# Patient Record
Sex: Female | Born: 1945
Health system: Southern US, Community
[De-identification: ages and names within clinical notes are randomized; demographics above are authoritative.]

## PROBLEM LIST (undated history)

## (undated) DIAGNOSIS — I10 Essential (primary) hypertension: Secondary | ICD-10-CM

## (undated) DIAGNOSIS — R0602 Shortness of breath: Secondary | ICD-10-CM

## (undated) DIAGNOSIS — M1990 Unspecified osteoarthritis, unspecified site: Secondary | ICD-10-CM

## (undated) DIAGNOSIS — F419 Anxiety disorder, unspecified: Secondary | ICD-10-CM

## (undated) DIAGNOSIS — E119 Type 2 diabetes mellitus without complications: Secondary | ICD-10-CM

## (undated) DIAGNOSIS — I2510 Atherosclerotic heart disease of native coronary artery without angina pectoris: Secondary | ICD-10-CM

## (undated) DIAGNOSIS — I639 Cerebral infarction, unspecified: Secondary | ICD-10-CM

## (undated) DIAGNOSIS — J45909 Unspecified asthma, uncomplicated: Secondary | ICD-10-CM

## (undated) DIAGNOSIS — H269 Unspecified cataract: Secondary | ICD-10-CM

## (undated) DIAGNOSIS — E785 Hyperlipidemia, unspecified: Secondary | ICD-10-CM

## (undated) HISTORY — PX: DILATION AND CURETTAGE OF UTERUS: SHX78

## (undated) HISTORY — PX: APPENDECTOMY: SHX54

## (undated) HISTORY — PX: VAGINAL HYSTERECTOMY: SUR661

## (undated) HISTORY — DX: Essential (primary) hypertension: I10

## (undated) HISTORY — DX: Hyperlipidemia, unspecified: E78.5

## (undated) HISTORY — PX: TUBAL LIGATION: SHX77

## (undated) HISTORY — PX: CHOLECYSTECTOMY: SHX55

## (undated) HISTORY — PX: TONSILLECTOMY: SUR1361

## (undated) HISTORY — DX: Unspecified cataract: H26.9

---

## 2000-06-25 ENCOUNTER — Encounter (HOSPITAL_COMMUNITY): Admission: RE | Admit: 2000-06-25 | Discharge: 2000-07-25 | Payer: Self-pay | Admitting: Neurosurgery

## 2000-07-29 ENCOUNTER — Encounter (HOSPITAL_COMMUNITY): Admission: RE | Admit: 2000-07-29 | Discharge: 2000-08-28 | Payer: Self-pay | Admitting: Neurosurgery

## 2000-08-01 ENCOUNTER — Encounter: Admission: RE | Admit: 2000-08-01 | Discharge: 2000-08-01 | Payer: Self-pay | Admitting: Neurosurgery

## 2001-10-07 ENCOUNTER — Ambulatory Visit (HOSPITAL_COMMUNITY): Admission: RE | Admit: 2001-10-07 | Discharge: 2001-10-07 | Payer: Self-pay | Admitting: Family Medicine

## 2001-10-07 ENCOUNTER — Encounter: Payer: Self-pay | Admitting: Family Medicine

## 2004-06-26 ENCOUNTER — Ambulatory Visit (HOSPITAL_COMMUNITY): Admission: RE | Admit: 2004-06-26 | Discharge: 2004-06-26 | Payer: Self-pay | Admitting: Family Medicine

## 2004-08-24 ENCOUNTER — Ambulatory Visit (HOSPITAL_COMMUNITY): Admission: RE | Admit: 2004-08-24 | Discharge: 2004-08-24 | Payer: Self-pay | Admitting: Family Medicine

## 2005-03-21 ENCOUNTER — Ambulatory Visit (HOSPITAL_COMMUNITY): Admission: RE | Admit: 2005-03-21 | Discharge: 2005-03-21 | Payer: Self-pay | Admitting: Family Medicine

## 2005-08-24 ENCOUNTER — Emergency Department (HOSPITAL_COMMUNITY): Admission: EM | Admit: 2005-08-24 | Discharge: 2005-08-24 | Payer: Self-pay | Admitting: Emergency Medicine

## 2006-03-18 HISTORY — PX: CARDIAC CATHETERIZATION: SHX172

## 2006-06-24 ENCOUNTER — Ambulatory Visit (HOSPITAL_COMMUNITY): Admission: RE | Admit: 2006-06-24 | Discharge: 2006-06-24 | Payer: Self-pay | Admitting: Cardiology

## 2006-06-24 ENCOUNTER — Ambulatory Visit: Payer: Self-pay | Admitting: Cardiovascular Disease

## 2006-07-09 ENCOUNTER — Ambulatory Visit: Payer: Self-pay | Admitting: Cardiovascular Disease

## 2006-07-09 ENCOUNTER — Ambulatory Visit (HOSPITAL_COMMUNITY): Admission: RE | Admit: 2006-07-09 | Discharge: 2006-07-09 | Payer: Self-pay | Admitting: Cardiovascular Disease

## 2007-02-06 ENCOUNTER — Ambulatory Visit (HOSPITAL_COMMUNITY): Admission: RE | Admit: 2007-02-06 | Discharge: 2007-02-06 | Payer: Self-pay | Admitting: Family Medicine

## 2007-03-01 ENCOUNTER — Emergency Department (HOSPITAL_COMMUNITY): Admission: EM | Admit: 2007-03-01 | Discharge: 2007-03-01 | Payer: Self-pay | Admitting: Emergency Medicine

## 2008-07-08 ENCOUNTER — Ambulatory Visit (HOSPITAL_COMMUNITY): Admission: RE | Admit: 2008-07-08 | Discharge: 2008-07-08 | Payer: Self-pay | Admitting: Family Medicine

## 2008-08-12 ENCOUNTER — Ambulatory Visit (HOSPITAL_COMMUNITY): Admission: RE | Admit: 2008-08-12 | Discharge: 2008-08-12 | Payer: Self-pay | Admitting: Family Medicine

## 2008-08-16 ENCOUNTER — Inpatient Hospital Stay (HOSPITAL_COMMUNITY): Admission: EM | Admit: 2008-08-16 | Discharge: 2008-08-18 | Payer: Self-pay | Admitting: Emergency Medicine

## 2008-08-17 ENCOUNTER — Ambulatory Visit: Payer: Self-pay | Admitting: Gastroenterology

## 2008-08-17 ENCOUNTER — Telehealth: Payer: Self-pay | Admitting: Gastroenterology

## 2008-08-18 ENCOUNTER — Encounter: Payer: Self-pay | Admitting: Gastroenterology

## 2008-08-29 ENCOUNTER — Telehealth: Payer: Self-pay | Admitting: Gastroenterology

## 2008-08-29 ENCOUNTER — Ambulatory Visit (HOSPITAL_COMMUNITY): Admission: RE | Admit: 2008-08-29 | Discharge: 2008-08-29 | Payer: Self-pay | Admitting: Gastroenterology

## 2008-08-29 ENCOUNTER — Ambulatory Visit: Payer: Self-pay | Admitting: Gastroenterology

## 2008-09-05 ENCOUNTER — Encounter: Payer: Self-pay | Admitting: Internal Medicine

## 2008-09-12 ENCOUNTER — Encounter: Payer: Self-pay | Admitting: Gastroenterology

## 2008-09-12 DIAGNOSIS — E785 Hyperlipidemia, unspecified: Secondary | ICD-10-CM | POA: Insufficient documentation

## 2008-09-12 DIAGNOSIS — E1159 Type 2 diabetes mellitus with other circulatory complications: Secondary | ICD-10-CM

## 2008-09-12 DIAGNOSIS — E119 Type 2 diabetes mellitus without complications: Secondary | ICD-10-CM

## 2008-09-12 DIAGNOSIS — I10 Essential (primary) hypertension: Secondary | ICD-10-CM | POA: Insufficient documentation

## 2008-09-12 DIAGNOSIS — E1169 Type 2 diabetes mellitus with other specified complication: Secondary | ICD-10-CM | POA: Insufficient documentation

## 2008-09-12 DIAGNOSIS — I152 Hypertension secondary to endocrine disorders: Secondary | ICD-10-CM | POA: Insufficient documentation

## 2008-09-13 ENCOUNTER — Ambulatory Visit: Payer: Self-pay | Admitting: Gastroenterology

## 2008-09-13 ENCOUNTER — Ambulatory Visit (HOSPITAL_COMMUNITY): Admission: RE | Admit: 2008-09-13 | Discharge: 2008-09-13 | Payer: Self-pay | Admitting: Gastroenterology

## 2008-09-13 ENCOUNTER — Emergency Department (HOSPITAL_COMMUNITY): Admission: EM | Admit: 2008-09-13 | Discharge: 2008-09-13 | Payer: Self-pay | Admitting: Emergency Medicine

## 2008-10-21 ENCOUNTER — Telehealth (INDEPENDENT_AMBULATORY_CARE_PROVIDER_SITE_OTHER): Payer: Self-pay

## 2009-03-05 ENCOUNTER — Emergency Department (HOSPITAL_COMMUNITY): Admission: EM | Admit: 2009-03-05 | Discharge: 2009-03-06 | Payer: Self-pay | Admitting: Emergency Medicine

## 2009-07-03 ENCOUNTER — Ambulatory Visit: Payer: Self-pay | Admitting: Cardiovascular Disease

## 2009-07-03 DIAGNOSIS — R011 Cardiac murmur, unspecified: Secondary | ICD-10-CM | POA: Insufficient documentation

## 2009-07-04 ENCOUNTER — Encounter: Payer: Self-pay | Admitting: Cardiovascular Disease

## 2009-07-10 ENCOUNTER — Ambulatory Visit: Payer: Self-pay | Admitting: Cardiology

## 2009-07-10 ENCOUNTER — Encounter: Payer: Self-pay | Admitting: Cardiovascular Disease

## 2009-07-10 ENCOUNTER — Ambulatory Visit (HOSPITAL_COMMUNITY): Admission: RE | Admit: 2009-07-10 | Discharge: 2009-07-10 | Payer: Self-pay | Admitting: Cardiovascular Disease

## 2009-09-04 ENCOUNTER — Ambulatory Visit: Payer: Self-pay | Admitting: Cardiovascular Disease

## 2009-09-04 DIAGNOSIS — R072 Precordial pain: Secondary | ICD-10-CM | POA: Insufficient documentation

## 2009-09-04 DIAGNOSIS — R079 Chest pain, unspecified: Secondary | ICD-10-CM

## 2009-09-19 ENCOUNTER — Ambulatory Visit: Payer: Self-pay | Admitting: Cardiovascular Disease

## 2009-10-03 ENCOUNTER — Ambulatory Visit: Payer: Self-pay | Admitting: Cardiology

## 2009-10-09 ENCOUNTER — Encounter: Payer: Self-pay | Admitting: Cardiovascular Disease

## 2009-12-16 HISTORY — PX: CARDIOVASCULAR STRESS TEST: SHX262

## 2010-01-12 ENCOUNTER — Inpatient Hospital Stay (HOSPITAL_COMMUNITY)
Admission: EM | Admit: 2010-01-12 | Discharge: 2010-01-13 | Payer: Self-pay | Source: Home / Self Care | Admitting: Emergency Medicine

## 2010-01-12 ENCOUNTER — Ambulatory Visit: Payer: Self-pay | Admitting: Cardiology

## 2010-02-22 ENCOUNTER — Encounter (INDEPENDENT_AMBULATORY_CARE_PROVIDER_SITE_OTHER): Payer: Self-pay | Admitting: *Deleted

## 2010-02-23 ENCOUNTER — Encounter (INDEPENDENT_AMBULATORY_CARE_PROVIDER_SITE_OTHER): Payer: Self-pay | Admitting: *Deleted

## 2010-03-13 ENCOUNTER — Ambulatory Visit (HOSPITAL_COMMUNITY)
Admission: RE | Admit: 2010-03-13 | Discharge: 2010-03-13 | Payer: Self-pay | Source: Home / Self Care | Attending: Gastroenterology | Admitting: Gastroenterology

## 2010-04-08 ENCOUNTER — Encounter: Payer: Self-pay | Admitting: Family Medicine

## 2010-04-17 NOTE — Assessment & Plan Note (Signed)
Summary: ***per Dr.McInnis for hyperlipidemia/past due for f/u/tg   Visit Type:  Follow-up Primary Provider:  Dr.Angus Mcinnis  CC:  past due for followup.  History of Present Illness: Tonya Carpenter is an anxious diabetic with HTN, elevated lipids and SSCP.  She has a normal cath in 2008.  She is very anxious.  She has a murmur with LVH and ? previous mid-cavitary lesion.  She has functional dyspnea.  Activity is also limited by back pain.  She denies syncope, fever, cough and mild chronic LE edema.  Apparantly lisinopril has been added recently for BP but I think her control is still suboptimal  Current Problems (verified): 1)  Diabetes Mellitus  (ICD-250.00) 2)  Hypertension, Unspecified  (ICD-401.9) 3)  Hyperlipidemia-mixed  (ICD-272.4)  Current Medications (verified): 1)  Fiber  Powd (Fiber) .Marland Kitchen.. 1 Dose Twice A Day. 2)  Lantus 100 Unit/ml Soln (Insulin Glargine) .... 75 U Qam 3)  Plavix 75 Mg Tabs (Clopidogrel Bisulfate) .Marland Kitchen.. 1 By Mouth Qd 4)  Humalog 100 Unit/ml Soln (Insulin Lispro (Human)) .... Slidind Scale 5)  Metoprolol Succinate 100 Mg Xr24h-Tab (Metoprolol Succinate) .... Take 1 Tab Daily 6)  Lisinopril 40 Mg Tabs (Lisinopril) .... Take 1 Tablet By Mouth Once Daily 7)  Simvastatin 40 Mg Tabs (Simvastatin) .... Take 1 Tab Daily  Allergies (verified): No Known Drug Allergies  Past History:  Past Medical History: Last updated: 09/12/2008 DIABETES MELLITUS (ICD-250.00) HYPERTENSION, UNSPECIFIED (ICD-401.9) ECG with marked LVH HYPERLIPIDEMIA-MIXED (ICD-272.4) Chest Pain:  normal cath 06/2006    Past Surgical History: Last updated: 09/12/2008 Abdominal Hysterectomy-Total tubal ligation Cholycystectomy  Social History: Last updated: 07/03/2009 Tobacco Use - No.  Alcohol Use - no Married On disability  Social History: Tobacco Use - No.  Alcohol Use - no Married On disability  Review of Systems       Denies fever, malais, weight loss, blurry vision, decreased  visual acuity, cough, sputum,  hemoptysis, pleuritic pain, palpitaitons, heartburn, abdominal pain, melena, lower extremity edema, claudication, or rash.   Vital Signs:  Patient profile:   65 year old female Height:      65 inches Weight:      205 pounds BMI:     34.24 Pulse rate:   76 / minute BP sitting:   185 / 83  (right arm)  Vitals Entered By: [name], CNA (July 03, 2009 1:09 PM)  Physical Exam  General:  Affect appropriate Healthy:  appears stated age HEENT: normal Neck supple with no adenopathy JVP normal no bruits no thyromegaly Lungs clear with no wheezing and good diaphragmatic motion Heart:  S1/S2 systolic murmur,rub, gallop or click PMI normal Abdomen: benighn, BS positve, no tenderness, no AAA no bruit.  No HSM or HJR Distal pulses intact with no bruits No edema Neuro non-focal Skin warm and dry    Impression & Recommendations:  Problem # 1:  CARDIAC MURMUR (ICD-785.2) Check echo.  No accentuation with valvsalva but history of small LV cavity and LVH Her updated medication list for this problem includes:    Metoprolol Succinate 100 Mg Xr24h-tab (Metoprolol succinate) .Marland Kitchen... Take 1 tab daily    Lisinopril 40 Mg Tabs (Lisinopril) .Marland Kitchen... Take 1 tablet by mouth once daily  Orders: 2-D Echocardiogram (2D Echo)  Problem # 2:  HYPERTENSION, UNSPECIFIED (ICD-401.9) Increase lisinopril.  F/U post echo Her updated medication list for this problem includes:    Metoprolol Succinate 100 Mg Xr24h-tab (Metoprolol succinate) .Marland Kitchen... Take 1 tab daily    Lisinopril 40 Mg Tabs (Lisinopril) .Marland KitchenMarland KitchenMarland KitchenMarland Kitchen  Take 1 tablet by mouth once daily  Problem # 3:  HYPERLIPIDEMIA-MIXED (ICD-272.4) Reviewed labs from Dr Adora Fridge LDL is 100 at goal with no side effects Her updated medication list for this problem includes:    Simvastatin 40 Mg Tabs (Simvastatin) .Marland Kitchen... Take 1 tab daily  Her updated medication list for this problem includes:    Simvastatin 40 Mg Tabs (Simvastatin) .Marland Kitchen...  Take 1 tab daily  Patient Instructions: 1)  Your physician recommends that you schedule a follow-up appointment in: 2 months 2)  Your physician has recommended you make the following change in your medication: Increase Lisinopril to 40mg  by mouth once daily  3)  Your physician has requested that you have an echocardiogram.  Echocardiography is a painless test that uses sound waves to create images of your heart. It provides your doctor with information about the size and shape of your heart and how well your heart's chambers and valves are working.  This procedure takes approximately one hour. There are no restrictions for this procedure. Prescriptions: LISINOPRIL 40 MG TABS (LISINOPRIL) Take 1 tablet by mouth once daily  #30 x 6   Entered by:   Larita Fife Via LPN   Authorized by:   Colon Branch, MD, [facility]   Signed by:   Larita Fife Via LPN on 16/12/9602   Method used:   Electronically to        [facility] (retail)       [address]       [place], [place]  [address]       Ph: [phone]       Fax: [phone]   RxID:   [id number]

## 2010-04-17 NOTE — Letter (Signed)
Summary: Appointment - Reminder 2  [facility]. [address]   [place], [place] [address]   Phone: [phone]  Fax: [phone]     February 23, 2010 MRN: [id number]   [facility] [place] [address] [place], [place]  [address]   Dear Ms. Mottley,  Our records indicate that it is time to schedule a follow-up appointment.  Dr. [place] Emms         recommended that you follow up with [place] in    12.2011        . It is very important that we reach you to schedule this appointment. We look forward to participating in your health care needs. Please contact us at the number listed above at your earliest convenience to schedule your appointment.  If you are unable to make an appointment at this time, give [place] a call so we can update our records.     Sincerely,   Glass blower/designer

## 2010-04-17 NOTE — Letter (Signed)
Summary: progress note  progress note   Imported By: Faythe Ghee 07/04/2009 12:00:29  _____________________________________________________________________  External Attachment:    Type:   Image     Comment:   External Document

## 2010-04-17 NOTE — Assessment & Plan Note (Signed)
Summary: nurse visit  Nurse Visit   Vital Signs:  Patient profile:   65 year old female O2 Sat:      97 % on Room air Pulse rate:   65 / minute BP sitting:   165 / 77  (right arm)  Vitals Entered By: Larita Fife Via LPN (October 03, 2009 9:55 AM)  O2 Flow:  Room air  Visit Type:  Nurse visit/BP check Primary Provider:  [facility]   History of Present Illness: S: Pt. arrives in office for second of three BP checks odered on last OV of 09-04-2009 with Dr. [place] Emms B: On last OV BP= 202/98, on first nurse visit/BP check on 09-19-09 BP=218/94, today BP= 165/77. Pt. is now being seen at the free clinic, she received Plavix this morning from [place] through the drug assistance program and all others throught the free clinic.  A: Pt. c/o leg cramps and feeling tired. Pt BS is elevated at 200 (she checked before coming to office).    Other Orders: EKG w/ Interpretation (93000)   Current Medications (verified): 1)  Fiber  Powd (Fiber) .Marland Kitchen.. 1 Dose Twice A Day. 2)  Lantus 100 Unit/ml Soln (Insulin Glargine) .... 75 U Qam 3)  Plavix 75 Mg Tabs (Clopidogrel Bisulfate) .Marland Kitchen.. 1 By Mouth Qd 4)  Humalog 100 Unit/ml Soln (Insulin Lispro (Human)) .... Sliding Scale 5)  Metoprolol Succinate 100 Mg Xr24h-Tab (Metoprolol Succinate) .... Take 1 Tab Daily 6)  Lisinopril 40 Mg Tabs (Lisinopril) .... Take 1 Tablet By Mouth Once Daily 7)  Simvastatin 40 Mg Tabs (Simvastatin) .... Take 1 Tab Daily 8)  Nitrostat 0.4 Mg Subl (Nitroglycerin) .... Take As Directed For Chestpain 9)  Hydralazine Hcl 10 Mg Tabs (Hydralazine Hcl) .... Take 1 Tablet By Mouth Once Daily  Allergies (verified): No Known Drug Allergies  Orders Added: 1)  EKG w/ Interpretation [93000]

## 2010-04-17 NOTE — Assessment & Plan Note (Signed)
Summary: F2M   Visit Type:  Follow-up Primary Provider:  Dr.Angus Mcinnis  CC:  chest discomfort yesterday.  History of Present Illness: Tonya Carpenter is an anxious diabetic with HTN, elevated lipids and SSCP.  She has a normal cath in 2008.  She is very anxious.  She has a murmur with LVH and ? previous mid-cavitary lesion.  She has functional dyspnea.  Activity is also limited by back pain.  She denies syncope, fever, cough and mild chronic LE edema.  Apparantly lisinopril has been added recently for BP but I think her control is still suboptimal  She is under stress about losing medicaid funding and has cheated on her diet.  She had lots of excuses about her BP being high.  I told her we would have her come back weekly x3 and if high we will call her in norvasc 10mg .    Current Problems (verified): 1)  R/O Mi  (ICD-410.90) 2)  R/O Cardiomyopathy, Dilated  (ICD-425.4) 3)  Cardiac Murmur  (ICD-785.2) 4)  Diabetes Mellitus  (ICD-250.00) 5)  Hypertension, Unspecified  (ICD-401.9) 6)  Hyperlipidemia-mixed  (ICD-272.4)  Current Medications (verified): 1)  Fiber  Powd (Fiber) .Marland Kitchen.. 1 Dose Twice A Day. 2)  Lantus 100 Unit/ml Soln (Insulin Glargine) .... 75 U Qam 3)  Plavix 75 Mg Tabs (Clopidogrel Bisulfate) .Marland Kitchen.. 1 By Mouth Qd 4)  Humalog 100 Unit/ml Soln (Insulin Lispro (Human)) .... Sliding Scale 5)  Metoprolol Succinate 100 Mg Xr24h-Tab (Metoprolol Succinate) .... Take 1 Tab Daily 6)  Lisinopril 40 Mg Tabs (Lisinopril) .... Take 1 Tablet By Mouth Once Daily 7)  Simvastatin 40 Mg Tabs (Simvastatin) .... Take 1 Tab Daily 8)  Nitrostat 0.4 Mg Subl (Nitroglycerin) .... Take As Directed For Chestpain  Allergies (verified): No Known Drug Allergies  Past History:  Past Medical History: Last updated: 09/12/2008 DIABETES MELLITUS (ICD-250.00) HYPERTENSION, UNSPECIFIED (ICD-401.9) ECG with marked LVH HYPERLIPIDEMIA-MIXED (ICD-272.4) Chest Pain:  normal cath 06/2006    Past Surgical History: Last  updated: 09/12/2008 Abdominal Hysterectomy-Total tubal ligation Cholycystectomy  Family History: Last updated: 09/04/2009 non-contributory  Social History: Last updated: 07/03/2009 Tobacco Use - No.  Alcohol Use - no Married On disability  Family History: non-contributory  Review of Systems       Denies fever, malais, weight loss, blurry vision, decreased visual acuity, cough, sputum, SOB, hemoptysis, pleuritic pain, palpitaitons, heartburn, abdominal pain, melena, lower extremity edema, claudication, or rash.   Vital Signs:  Patient profile:   65 year old female Weight:      208 pounds Pulse rate:   70 / minute BP sitting:   202 / 98  (right arm)  Vitals Entered By: [name], CNA (September 04, 2009 9:44 AM)  Physical Exam  General:  Affect appropriate Healthy:  appears stated age HEENT: normal Neck supple with no adenopathy JVP normal no bruits no thyromegaly Lungs clear with no wheezing and good diaphragmatic motion Heart:  S1/S2 no murmur,rub, gallop or click PMI normal Abdomen: benighn, BS positve, no tenderness, no AAA no bruit.  No HSM or HJR Distal pulses intact with no bruits No edema Neuro non-focal Skin warm and dry    Impression & Recommendations:  Problem # 1:  HYPERTENSION, UNSPECIFIED (ICD-401.9) Not well controlled but paitent resident to add medicine.  Told her of risk of stroke, MI, disection and kidney failure if HTN not well controlled Also stressed low sodium diet.  F/U clinici visit x3 and add norvasc 10mg  if still high. Her updated medication list for  this problem includes:    Metoprolol Succinate 100 Mg Xr24h-tab (Metoprolol succinate) .Marland Kitchen... Take 1 tab daily    Lisinopril 40 Mg Tabs (Lisinopril) .Marland Kitchen... Take 1 tablet by mouth once daily  Problem # 2:  HYPERLIPIDEMIA-MIXED (ICD-272.4) Check labs in 6 months. Her updated medication list for this problem includes:    Simvastatin 40 Mg Tabs (Simvastatin) .Marland Kitchen... Take 1 tab  daily  Problem # 3:  CHEST PAIN UNSPECIFIED (ICD-786.50) Non recurrent.  No history of CAD with previous normal myovue.   Her updated medication list for this problem includes:    Plavix 75 Mg Tabs (Clopidogrel bisulfate) .Marland Kitchen... 1 by mouth qd    Metoprolol Succinate 100 Mg Xr24h-tab (Metoprolol succinate) .Marland Kitchen... Take 1 tab daily    Lisinopril 40 Mg Tabs (Lisinopril) .Marland Kitchen... Take 1 tablet by mouth once daily    Nitrostat 0.4 Mg Subl (Nitroglycerin) .Marland Kitchen... Take as directed for chestpain  Patient Instructions: 1)  Your physician recommends that you schedule a follow-up appointment in: 6 months 2)  Your physician recommends that you continue on your current medications as directed. Please refer to the Current Medication list given to you today.

## 2010-04-17 NOTE — Letter (Signed)
Summary: Appointment - Reminder 2  [facility]  [address]   [place], [place] [address]   Phone: [phone]  Fax: [phone]     February 23, 2010 MRN: [id number]   Shaynah Varkey [address] [place],   [address]   Dear Ms. Burciaga,  Our records indicate that it is time to schedule a follow-up appointment.  Dr. NISHAN         recommended that you follow up with us in    12.2011        . It is very important that we reach you to schedule this appointment. We look forward to participating in your health care needs. Please contact us at the number listed above at your earliest convenience to schedule your appointment.  If you are unable to make an appointment at this time, give us a call so we can update our records.     Sincerely,   [facility] Scheduling Team 

## 2010-04-17 NOTE — Letter (Signed)
Summary: Appointment - Reminder 2  [facility] [place]. [address], [place] [address]   Phone: [phone]  Fax: [phone]     February 22, 2010 MRN: [id number]   [facility] [place] [address] [place], [place]  [address]   Dear Ms. Pizzimenti,  Our records indicate that it is time to schedule a follow-up appointment.  Dr.   [place] Emms       recommended that you follow up with [place] in    02/2010        . It is very important that we reach you to schedule this appointment. We look forward to participating in your health care needs. Please contact us at the number listed above at your earliest convenience to schedule your appointment.  If you are unable to make an appointment at this time, give [place] a call so we can update our records.     Sincerely,   Glass blower/designer

## 2010-04-17 NOTE — Assessment & Plan Note (Signed)
Summary: 2 wk bp check per checkout on 09/04/09/tg  Nurse Visit   Vital Signs:  Patient profile:   65 year old female Height:      65 inches Weight:      209 pounds Pulse rate:   67 / minute BP sitting:   218 / 94  (right arm)  Vitals Entered By: [name], CNA (September 19, 2009 9:58 AM)   Visit Type:  2 week nurse visit Primary Provider:  Dr.Angus Mcinnis   History of Present Illness: S: 2 week nurse visti B: ov 09/04/09, pt was to be coming to the office weekly x3 for a bp check     have financial issues, now being seen at the [facility] A: pt taking meds as prescribed, htn not controlled, pt denies dizziness or h/a     no other c/o R: gave pt plavix assistance forms and other information on cheaper med costs at other pharmacies in the area for simvastatin., pt waiting to see if the free clinic will help with her meds.   Preventive Screening-Counseling & Management  Alcohol-Tobacco     Smoking Status: quit > 6 months  Current Medications (verified): 1)  Fiber  Powd (Fiber) .Marland Kitchen.. 1 Dose Twice A Day. 2)  Lantus 100 Unit/ml Soln (Insulin Glargine) .... 75 U Qam 3)  Plavix 75 Mg Tabs (Clopidogrel Bisulfate) .Marland Kitchen.. 1 By Mouth Qd 4)  Humalog 100 Unit/ml Soln (Insulin Lispro (Human)) .... Sliding Scale 5)  Metoprolol Succinate 100 Mg Xr24h-Tab (Metoprolol Succinate) .... Take 1 Tab Daily 6)  Lisinopril 40 Mg Tabs (Lisinopril) .... Take 1 Tablet By Mouth Once Daily 7)  Simvastatin 40 Mg Tabs (Simvastatin) .... Take 1 Tab Daily 8)  Nitrostat 0.4 Mg Subl (Nitroglycerin) .... Take As Directed For Chestpain  Allergies (verified): No Known Drug Allergies  Appended Document: 2 wk bp check per checkout on 09/04/09/tg pt made aware of md recommendations

## 2010-04-17 NOTE — Letter (Signed)
Summary: [facility]  [facility]   Imported By: Faythe Ghee 10/09/2009 12:10:11  _____________________________________________________________________  External Attachment:    Type:   Image     Comment:   External Document

## 2010-05-30 LAB — DIFFERENTIAL
Basophils Absolute: 0 10*3/uL (ref 0.0–0.1)
Basophils Relative: 0 % (ref 0–1)
Eosinophils Absolute: 0.1 10*3/uL (ref 0.0–0.7)
Lymphocytes Relative: 25 % (ref 12–46)
Lymphs Abs: 3.5 10*3/uL (ref 0.7–4.0)
Monocytes Absolute: 0.7 10*3/uL (ref 0.1–1.0)
Monocytes Absolute: 1 10*3/uL (ref 0.1–1.0)
Monocytes Relative: 6 % (ref 3–12)
Neutro Abs: 7.5 10*3/uL (ref 1.7–7.7)
Neutrophils Relative %: 66 % (ref 43–77)

## 2010-05-30 LAB — COMPREHENSIVE METABOLIC PANEL
ALT: 13 U/L (ref 0–35)
AST: 16 U/L (ref 0–37)
Alkaline Phosphatase: 56 U/L (ref 39–117)
CO2: 29 mEq/L (ref 19–32)
Calcium: 9.2 mg/dL (ref 8.4–10.5)
GFR calc Af Amer: 55 mL/min — ABNORMAL LOW (ref >60)
Glucose, Bld: 156 mg/dL — ABNORMAL HIGH (ref 70–99)
Potassium: 3.7 mEq/L (ref 3.5–5.1)
Sodium: 139 mEq/L (ref 135–145)
Total Bilirubin: 0.5 mg/dL (ref 0.3–1.2)
Total Protein: 6.7 g/dL (ref 6.0–8.3)

## 2010-05-30 LAB — CBC
HCT: 39.5 % (ref 36.0–46.0)
Hemoglobin: 11.9 g/dL — ABNORMAL LOW (ref 12.0–15.0)
Hemoglobin: 12.8 g/dL (ref 12.0–15.0)
MCH: 26.6 pg (ref 26.0–34.0)
MCH: 27.7 pg (ref 26.0–34.0)
MCHC: 31.6 g/dL (ref 30.0–36.0)
MCHC: 32.4 g/dL (ref 30.0–36.0)
Platelets: 206 10*3/uL (ref 150–400)
RBC: 4.62 MIL/uL (ref 3.87–5.11)
RDW: 13.3 % (ref 11.5–15.5)

## 2010-05-30 LAB — POCT I-STAT, CHEM 8
Calcium, Ion: 1.19 mmol/L (ref 1.12–1.32)
Chloride: 106 mEq/L (ref 96–112)
Creatinine, Ser: 1.5 mg/dL — ABNORMAL HIGH (ref 0.4–1.2)
Glucose, Bld: 128 mg/dL — ABNORMAL HIGH (ref 70–99)
HCT: 42 % (ref 36.0–46.0)

## 2010-05-30 LAB — CARDIAC PANEL(CRET KIN+CKTOT+MB+TROPI)
CK, MB: 3.1 ng/mL (ref 0.3–4.0)
CK, MB: 3.2 ng/mL (ref 0.3–4.0)
Relative Index: 1.6 (ref 0.0–2.5)
Relative Index: 1.7 (ref 0.0–2.5)
Total CK: 204 U/L — ABNORMAL HIGH (ref 7–177)
Troponin I: 0.01 ng/mL (ref 0.00–0.06)

## 2010-05-30 LAB — GLUCOSE, CAPILLARY
Glucose-Capillary: 146 mg/dL — ABNORMAL HIGH (ref 70–99)
Glucose-Capillary: 235 mg/dL — ABNORMAL HIGH (ref 70–99)

## 2010-05-30 LAB — TROPONIN I: Troponin I: 0.02 ng/mL (ref 0.00–0.06)

## 2010-05-30 LAB — POCT CARDIAC MARKERS
CKMB, poc: 2.4 ng/mL (ref 1.0–8.0)
Troponin i, poc: <0.05 ng/mL (ref 0.00–0.09)

## 2010-05-30 LAB — PROTIME-INR
INR: 1.17 (ref 0.00–1.49)
Prothrombin Time: 15.1 seconds (ref 11.6–15.2)

## 2010-05-30 LAB — CK TOTAL AND CKMB (NOT AT ARMC)
CK, MB: 3.7 ng/mL (ref 0.3–4.0)
Relative Index: 1.5 (ref 0.0–2.5)

## 2010-06-18 LAB — URINALYSIS, ROUTINE W REFLEX MICROSCOPIC
Bilirubin Urine: NEGATIVE
Ketones, ur: NEGATIVE mg/dL
Protein, ur: NEGATIVE mg/dL
Urobilinogen, UA: 0.2 mg/dL (ref 0.0–1.0)

## 2010-06-18 LAB — DIFFERENTIAL
Basophils Relative: 0 % (ref 0–1)
Lymphs Abs: 2.8 10*3/uL (ref 0.7–4.0)
Monocytes Absolute: 1.1 10*3/uL — ABNORMAL HIGH (ref 0.1–1.0)
Monocytes Relative: 9 % (ref 3–12)
Neutro Abs: 7.6 10*3/uL (ref 1.7–7.7)
Neutrophils Relative %: 65 % (ref 43–77)

## 2010-06-18 LAB — CBC
MCHC: 32.5 g/dL (ref 30.0–36.0)
Platelets: 200 10*3/uL (ref 150–400)
RDW: 14.2 % (ref 11.5–15.5)

## 2010-06-18 LAB — COMPREHENSIVE METABOLIC PANEL
ALT: 13 U/L (ref 0–35)
Albumin: 3.4 g/dL — ABNORMAL LOW (ref 3.5–5.2)
Alkaline Phosphatase: 43 U/L (ref 39–117)
BUN: 12 mg/dL (ref 6–23)
Calcium: 8.8 mg/dL (ref 8.4–10.5)
Potassium: 3.3 mEq/L — ABNORMAL LOW (ref 3.5–5.1)
Sodium: 136 mEq/L (ref 135–145)
Total Protein: 7.1 g/dL (ref 6.0–8.3)

## 2010-06-18 LAB — URINE CULTURE: Colony Count: >=100000

## 2010-06-18 LAB — URINE MICROSCOPIC-ADD ON

## 2010-06-25 LAB — URINALYSIS, ROUTINE W REFLEX MICROSCOPIC
Glucose, UA: NEGATIVE mg/dL
Ketones, ur: NEGATIVE mg/dL
Nitrite: NEGATIVE
Specific Gravity, Urine: 1.01 (ref 1.005–1.030)
pH: 7 (ref 5.0–8.0)

## 2010-06-25 LAB — CBC
HCT: 39.5 % (ref 36.0–46.0)
Hemoglobin: 13.4 g/dL (ref 12.0–15.0)
MCV: 84.6 fL (ref 78.0–100.0)
Platelets: 189 10*3/uL (ref 150–400)
RBC: 4.67 MIL/uL (ref 3.87–5.11)
WBC: 8.2 10*3/uL (ref 4.0–10.5)

## 2010-06-25 LAB — GLUCOSE, CAPILLARY
Glucose-Capillary: 120 mg/dL — ABNORMAL HIGH (ref 70–99)
Glucose-Capillary: 142 mg/dL — ABNORMAL HIGH (ref 70–99)
Glucose-Capillary: 94 mg/dL (ref 70–99)
Glucose-Capillary: 99 mg/dL (ref 70–99)

## 2010-06-25 LAB — BASIC METABOLIC PANEL
Chloride: 104 mEq/L (ref 96–112)
GFR calc Af Amer: 58 mL/min — ABNORMAL LOW (ref >60)
GFR calc non Af Amer: 48 mL/min — ABNORMAL LOW (ref >60)
Potassium: 3.9 mEq/L (ref 3.5–5.1)
Sodium: 139 mEq/L (ref 135–145)

## 2010-06-25 LAB — HEPATIC FUNCTION PANEL
ALT: 17 U/L (ref 0–35)
Alkaline Phosphatase: 55 U/L (ref 39–117)
Indirect Bilirubin: 0.2 mg/dL — ABNORMAL LOW (ref 0.3–0.9)
Total Bilirubin: 0.3 mg/dL (ref 0.3–1.2)
Total Protein: 6.6 g/dL (ref 6.0–8.3)

## 2010-06-25 LAB — DIFFERENTIAL
Eosinophils Absolute: 0 10*3/uL (ref 0.0–0.7)
Eosinophils Relative: 0 % (ref 0–5)
Lymphocytes Relative: 18 % (ref 12–46)
Lymphs Abs: 1.5 10*3/uL (ref 0.7–4.0)
Monocytes Relative: 5 % (ref 3–12)

## 2010-06-25 LAB — LIPASE, BLOOD: Lipase: 26 U/L (ref 11–59)

## 2010-07-31 NOTE — H&P (Signed)
NAME:  Tonya Carpenter, Tonya Carpenter                   ACCOUNT NO.:  [id number]   MEDICAL RECORD NO.:  [id number]          PATIENT TYPE:  INP   LOCATION:  A311                          FACILITY:  [facility]   PHYSICIAN:  Angus G. Renard Matter, MD   DATE OF BIRTH:  05-25-1945   DATE OF ADMISSION:  DATE OF DISCHARGE:  LH                              HISTORY & PHYSICAL   The patient was given IV Dilaudid in the emergency department and  subsequently was admitted.   SOCIAL HISTORY:  The patient does not smoke or drink alcohol.   PAST SURGICAL HISTORY:  Prior history of hysterectomy, tubal ligation.   PRIOR MEDICAL HISTORY:  Hypertension, diabetes, and hyperlipidemia.   ALLERGIES:  No known drug allergies.   MEDICATIONS:  1. Accupril 40 mg daily.  2. Toprol-XL 100 mg daily.  3. Aspirin 81 mg daily.  4. Nitroglycerin 0.4 mg p.r.n.  5. Lantus insulin 75 units each morning.  6. P.r.n. Humalog insulin.  7. Oxycodone.  8. Acetaminophen 325 p.r.n.  9. Vicodin 10/20 once a day.  10.Plavix 75 mg daily.  11.MiraLax daily.   REVIEW OF SYSTEMS:  HEENT:  Negative.  CARDIOPULMONARY:  No cough.  No  hemoptysis.  GI:  No nausea, vomiting or diarrhea.  GU: No dysuria or  hematuria.   PHYSICAL EXAMINATION:  GENERAL:  Alert, somewhat uncomfortable female  with blood pressure on admission 150/70, respiration 20, pulse 57, and  temp 97.  HEENT:  Eyes, PERRLA.  TMs negative.  Oropharynx is benign.  NECK:  Supple.  No JVD or thyroid abnormalities.  HEART:  Regular rhythm.  No murmurs.  LUNGS: Clear to P and A.  ABDOMEN:  Palpable organs or masses, but was tender both in right and  left lower quadrant.  EXTREMITIES:  No edema.  NEUROLOGIC:  No focal deficit.   PERTINENT LABORATORY DATA:  CBC; WBC 8200 with a hemoglobin of 13.4,  hematocrit 39.5, and glucose 111.  Chemistries; BUN 9, creatinine 1.15,  and GFR 58.  Urinalysis negative.   ASSESSMENT:  The patient with longstanding diabetes admitted with lower  abdominal  pain of undetermined etiology, admitted for poor pain control  and further evaluation by GI service.      Angus G. Renard Matter, MD  Electronically Signed     AGM/MEDQ  D:  08/17/2008  T:  08/17/2008  Job:  [id number]

## 2010-07-31 NOTE — H&P (Signed)
NAME:  COLE, KLUGH                   ACCOUNT NO.:  [id number]   MEDICAL RECORD NO.:  [id number]          PATIENT TYPE:  INP   LOCATION:  A311                          FACILITY:  [facility]   PHYSICIAN:  Angus G. Renard Matter, MD   DATE OF BIRTH:  Jun 12, 1945   DATE OF ADMISSION:  DATE OF DISCHARGE:  LH                              HISTORY & PHYSICAL   A 65 year old Afro-American female presented to the emergency room with  a chief complaint of abdominal pain.  The patient had been experiencing  lower abdominal pain for approximately 1 week and this has become  progressively worse.  She had had as an outpatient prior to this visit a  CT of abdomen and CT of pelvis, which was ordered through the office.  There were no acute findings in the CT of the abdomen.  CT of the pelvis  showed no acute findings.  Moderate amount of stool in sigmoid colon.  Pelvic, small bowel unremarkable.  No free fluid, free air, or  adenopathy.  Acute abdominal series done after admission showed no acute  findings.   Lab data ordered by emergency room physician, CBC, WBC 8200 with  hemoglobin 13.4, hematocrit 38.5.      Angus G. Renard Matter, MD  Electronically Signed     AGM/MEDQ  D:  08/16/2008  T:  08/17/2008  Job:  [id number]

## 2010-07-31 NOTE — Consult Note (Signed)
NAME:  Tonya Carpenter, Tonya Carpenter NO.:  [id number]   MEDICAL RECORD NO.:  [id number]          PATIENT TYPE:  INP   LOCATION:  A311                          FACILITY:  [facility]   PHYSICIAN:  Kassie Mends, M.D.      DATE OF BIRTH:  09-Feb-1946   DATE OF CONSULTATION:  08/17/2008  DATE OF DISCHARGE:                                 CONSULTATION   REFERRING PHYSICIAN:  Angus G. Renard Matter, MD   REASON FOR CONSULTATION:  Abdominal pain.   HISTORY OF PRESENT ILLNESS:  The patient is a pleasant 65 year old  African American female, patient of Dr. Butch Penny, who has had a 1  week history of lower abdominal discomfort.  She states that her pain  started about one week ago.  After a couple of days of lower abdominal  pain, crampy-type pain, she went to see Dr. Renard Matter.  She had a CT scan  of the abdomen and pelvis with contrast on 08/12/2008 which showed a  moderate amount of stool in the sigmoid colon.  He prescribed MiraLax  and she was also given Percocet.  She states that she generally has a  bowel movement on most days, though she does have constipation  chronically.  Sometimes, her stools are hard and she will have some  bright red blood associated with it.  Over the last week, since on  MiraLax, she is having a small stool almost every day.  She denies  melena.  She denies any dysuria or hematuria.  She had some vomiting  yesterday after taking her MiraLax and Percocet.  She said she took it  on an empty stomach and thought that might have caused  it.  She really  denies any chronic vomiting.  She denies heartburn, dysphagia,  odynophagia, or weight loss.  Yesterday, for persistent pain, she came  to the emergency department.  Acute abdominal series was negative from a  GI standpoint.  She did have cardiomegaly.  Her CBC, lipase and  urinalysis were all normal.  LFTs were normal except for albumin of 3.3.  Her MET-7 is normal except glucose of 196.  She received 2 mg of  Dilaudid  in the emergency department yesterday afternoon and has not  required any pain medications since that time.  She has tolerated a  clear liquid diet.   MEDICATIONS:  1. MiraLax 17 grams daily, recently started a week ago.  2. Percocet 5/325 mg every 4 hours as needed, recently given for      abdominal pain.  3. Humalog sliding scale insulin.  4. Quinapril 40 mg daily.  5. Vytorin 10/20 mg daily.  6. Metoprolol 100 mg daily.  7. Plavix 75 mg daily.  8. Aspirin 81 mg daily.  9. Nitroglycerin 0.4 mg p.r.n.  10.Lantus 75 units in the morning.   ALLERGIES:  No known drug allergies.   PAST MEDICAL HISTORY:  1. Hypertension.  2. Hypercholesterolemia.  3. Diabetes mellitus.   PAST SURGICAL HISTORY:  Hysterectomy, appendectomy, cholecystectomy.   FAMILY HISTORY:  Negative for chronic GI illnesses, colorectal cancer or  liver disease.  Significant for diabetes mellitus.   SOCIAL HISTORY:  She is married, has 2 sons.  Denies tobacco, alcohol or  drug use.   REVIEW OF SYSTEMS:  GI:  See HPI.  CONSTITUTIONAL:  Denies any weight  loss.  CARDIOPULMONARY:  She denies any chest pain, palpitations,  shortness of breath or cough.  GENITOURINARY:  Denies any dysuria,  hematuria.   PHYSICAL EXAMINATION:  Temperature 97, pulse 57, respiratory 20, blood  pressure 150/70, O2 saturation is 94% on room air.  Height 64 inches,  weight 98.9 kg.  GENERAL:  Pleasant, obese black female in no distress.  SKIN:  Warm and dry, no jaundice.  HEENT:  Sclerae anicteric.  Oropharyngeal mucosa moist and pink.  No  lesions, erythema or exudate.  NECK:  No lymphadenopathy or thyromegaly.  LUNGS:  Clear to auscultation.  CARDIAC:  Regular rate and rhythm, normal S1 and S2, no murmurs, rubs or  gallops.  ABDOMEN:  Obese, positive bowel sounds, soft, nontender.  No  organomegaly or mass.  No abdominal bruits or hernias. No rebound or  guarding.  LOWER EXTREMITIES:  No edema.   LABORATORY DATA:  As mentioned  above.   IMPRESSION:  Patient is a 65 year old lady with a one week history of  lower abdominal discomfort.  Workup including CT, acute abdominal  series, labs have been unremarkable except for moderate amount of stool  in the sigmoid colon.  Pain is much better since hospitalization.  She  has not required any pain medications.  She is tolerating clear liquid  diet.  Suspect abdominal pain secondary to constipation.  She has had  some intermittent hematochezia therefore recommend colonoscopy at some  point in the near future.  Regarding nausea and vomiting yesterday,  likely related to medication effect.  She denies any chronic upper  gastrointestinal symptoms.   RECOMMENDATIONS:  1. Continue MiraLax 17 grams p.o. daily.  2. Will give her tap water enema x1 today.  3. Milk of magnesia 30 cc now.  4. Colonoscopy.  Will discuss with Dr. Cira Servant regarding timing of this      procedure.   Further recommendations to follow.  I would like to thank Dr. Renard Matter  for allowing me to take part in the care of this patient.      Tonya Carpenter, P.A.      Kassie Mends, M.D.  Electronically Signed    LL/MEDQ  D:  08/17/2008  T:  08/17/2008  Job:  [id number]   cc:   Angus G. Renard Matter, MD  Fax: [phone]

## 2010-07-31 NOTE — Op Note (Signed)
NAME:  DRU, LAUREL                   ACCOUNT NO.:  [id number]   MEDICAL RECORD NO.:  [id number]          PATIENT TYPE:  AMB   LOCATION:  DAY                           FACILITY:  [facility]   PHYSICIAN:  Kassie Mends, M.D.      DATE OF BIRTH:  [birth year]   DATE OF PROCEDURE:  09/13/2008  DATE OF DISCHARGE:                               OPERATIVE REPORT   REFERRING PHYSICIAN:  Angus G. Renard Matter, MD   PROCEDURE:  Sigmoidoscopy.   INDICATION FOR EXAM:  Ms. Tonya Carpenter is a 65 year old female who was last seen  on June 14 for a colonoscopy.  She presented with rectal bleeding and  lower abdominal pain and constipation.  She had a suboptimal prep and  presents to complete evaluation of her left colon.   FINDINGS:  1. Bowel prep excellent and able to advance the scope to the cecum.      No polyps, masses, inflammatory changes, diverticula, or AVMs seen.  2. Moderate internal hemorrhoids.  Otherwise, normal retroflex view of      the rectum.   RECOMMENDATIONS:  1. Screening colonoscopy in 10 years.  2. She should void constipation and straining.  She should continue      the MiraLax daily.  3. She may restart the Plavix.  The patient is no longer taking      aspirin.  4. She should follow a high-fiber diet.  She is given a handout on      high-fiber diet, constipation, and hemorrhoids.   MEDICATIONS:  1. Demerol 75 mg IV.  2. Versed 5 mg IV.   PROCEDURE TECHNIQUE:  Physical exam was performed.  Informed consent was  obtained from the patient after explaining the benefits, risks and  alternatives to the procedure.  The patient was connected to the monitor  and placed in the left lateral position.  Continuous oxygen was provided  by nasal cannula.  IV medicine administered through an indwelling  cannula.  After administration of sedation and rectal exam, the  patient's rectum was intubated and the  scope was advanced under direct visualization to the cecum.  Scope was  removed slowly by carefully  examining the color, texture, anatomy, and  integrity of the mucosa on the way out.  The patient was recovered in  endoscopy and discharged home in satisfactory condition.      Kassie Mends, M.D.  Electronically Signed     SM/MEDQ  D:  09/13/2008  T:  09/14/2008  Job:  [id number]   cc:   Angus G. Renard Matter, MD  Fax: [phone]

## 2010-07-31 NOTE — Group Therapy Note (Signed)
NAME:  Tonya Carpenter, SURGES                   ACCOUNT NO.:  [id number]   MEDICAL RECORD NO.:  [id number]          PATIENT TYPE:  INP   LOCATION:  A311                          FACILITY:  [facility]   PHYSICIAN:  Angus G. Renard Matter, MD   DATE OF BIRTH:  [birth year]   DATE OF PROCEDURE:  DATE OF DISCHARGE:                                 PROGRESS NOTE   This patient was admitted yesterday with lower abdominal pain.  She had  had a previous CT of her abdomen which was essentially negative.  She  had also abdominal films yesterday which were negative.  The patient was  given IV Dilaudid for pain.  This seemed to control her pain, although  she is still having some lower abdominal discomfort.   OBJECTIVE:  VITAL SIGNS:  Blood pressure 150/70, respirations 20, pulse  57, temp 97.  LUNGS:  Clear to P and A.  HEART:  Regular rhythm.  ABDOMEN:  Slight tenderness over the right and left lower quadrant of  abdomen.   PERTINENT LABORATORY DATA:  CBC, WBC 8200 with a hemoglobin 13.4,  hematocrit 39.5.  Chemistries within normal range with the exception of  glucose 196.  Urinalysis negative.   ASSESSMENT:  The patient was admitted with lower abdominal pain.  She  feels some better today.  Will be seen by GI Service.      Angus G. Renard Matter, MD  Electronically Signed     AGM/MEDQ  D:  08/17/2008  T:  08/17/2008  Job:  [id number]

## 2010-07-31 NOTE — Discharge Summary (Signed)
NAME:  Tonya Carpenter, Tonya Carpenter                   ACCOUNT NO.:  [id number]   MEDICAL RECORD NO.:  [id number]          PATIENT TYPE:  INP   LOCATION:  A311                          FACILITY:  [facility]   PHYSICIAN:  Angus G. Renard Matter, MD   DATE OF BIRTH:  March 04, 1946   DATE OF ADMISSION:  08/16/2008  DATE OF DISCHARGE:  06/03/2010LH                               DISCHARGE SUMMARY   A 65 year old Afro American female was admitted, August 16, 2008,  discharged August 18, 2008, 2 days' hospitalization.   DIAGNOSES:  Lower abdominal pain, possibly secondary to constipation;  intermittent hematochezia; diabetes mellitus type 2; hypertension.   The patient's condition stable at the time of her discharge.   This 65 year old Afro American female presented to the emergency  apartment with chief complaint of abdominal pain.  She had been  experiencing lower abdominal pain for approximately 1 week and it had  become progressively worse.  She has had outpatient CT of the abdomen  and CT of the pelvis, which was ordered through the office.  There were  no acute findings on the CT.  CT of pelvis showed no acute findings,  moderate amount of stool was noted in sigmoid colon, small bowel  unremarkable, no free fluid or air or adenopathy.  Acute abdominal  series done after admission showed no acute findings.  She apparently  had had chronic constipation intermittently and episodes of vomiting.   PHYSICAL EXAMINATION:  GENERAL AND VITAL SIGNS:  On admission, alert,  somewhat uncomfortable female with blood pressure on admission 150/70,  respirations 20, pulse 57, and temperature 97.  HEENT:  Eyes, PERRLA.  TMs negative.  Oropharynx benign.  NECK:  Supple.  No JVD or thyroid abnormalities.  HEART:  Regular rhythm.  No murmurs.  LUNGS:  Clear to P&A.  ABDOMEN:  No palpable organs or masses, but she was tender in right and  the left lower quadrants of the abdomen.  EXTREMITIES:  No edema.  NEUROLOGIC:  No focal deficit.   LABORATORY DATA:  Admission CBC; WBC 8200 with a hemoglobin of 13.4,  hematocrit 39.5.  Chemistries; sodium 139, potassium 3.9, chloride 104,  CO2 29, glucose 196, BUN 9, creatinine 1.15, GFR 48.  Urinalysis  negative.  Liver enzymes; SGOT 28, SGPT 17, alkaline phosphatase 55,  bilirubin 0.3.  X-rays; acute abdominal series, no acute findings.  Cardiomegaly noted.   HOSPITAL COURSE:  The patient at the time of her admission was placed on  clear liquids.  IV fluids, half-normal saline 75 mL/hour.  Accu-Cheks  a.c. and nightly.  Vital signs q.i.d.  She was given Dilaudid 2 mg  intravenously every 3 hours p.r.n. for severe pain, was given Zofran 4  mg IV q.6 h. p.r.n. for nausea.  She is continued on Accupril 40 mg  daily; Toprol 100 mg daily; aspirin 81 mg daily; Lantus insulin 75 units  each day; sliding scale, moderate coverage; short-acting insulin.  She  is continued on Vytorin 10/20 one daily, Plavix 75 mg daily.  She was  seen in consultation by GI service, was given milk  of magnesia, tap  water enema.  With success, inpatient pain improved.  A prior workup  included CT of abdomen.  Abdominal series were unremarkable, except for  moderate amount of stool in the sigmoid colon and her suspicion was that  the pain was secondary to constipation.  She had some intermittent  hematochezia.  Prior to admission, felt that followup colonoscopy as an  outpatient would be indicated.  The patient improved, was able to be  discharged home after 2 days' hospitalization.   DISCHARGE MEDICATIONS:  She was discharged on the following medications;  1. MiraLax twice a day.  2. Percocet 5/325 q.4 h. p.r.n.  3. Humalog sliding scale as needed.  4. Quinapril hydrochloride 40 mg daily.  5. Vytorin 10/20 one daily.  6. Metoprolol 100 mg daily.  7. Plavix 75 mg hold.  8. Nitroglycerin 0.4 mg p.r.n.  9. Lantus insulin 75 units daily.  10.Benicar 20 mg daily.   The patient was instructed to return to the  office in 1 week following  discharge.      Angus G. Renard Matter, MD  Electronically Signed     AGM/MEDQ  D:  09/06/2008  T:  09/06/2008  Job:  [id number]

## 2010-07-31 NOTE — Op Note (Signed)
NAME:  Tonya Carpenter, Tonya Carpenter                   ACCOUNT NO.:  [id number]   MEDICAL RECORD NO.:  [id number]          PATIENT TYPE:  AMB   LOCATION:  DAY                           FACILITY:  [facility]   PHYSICIAN:  Kassie Mends, M.D.      DATE OF BIRTH:  01/26/1946   DATE OF PROCEDURE:  08/29/2008  DATE OF DISCHARGE:  08/29/2008                               OPERATIVE REPORT   REFERRING PHYSICIAN:  Angus G. Renard Matter, MD   PROCEDURE:  Ileocolonoscopy.   INDICATION FOR EXAM:  Tonya Carpenter is a 65 year old female who presented to  the emergency department with rectal bleeding and lower abdominal pain  as well as constipation.   FINDINGS:  1. The patient had a fair bowel prep.  Polyps less than 5 mm in the      left colon would have been easily missed.  Able to visualize the      remaining colon adequately with rotation and irrigation.  She did      have particulate matter in the lumen.  She ate chicken and peas at      10 o'clock.  2. Otherwise, no polyps, masses, inflammatory changes, diverticula, or      AVMs.  3. Small internal hemorrhoids, otherwise normal retroflexed view of      the rectum.  4. Normal terminal ileum, approximately 5 cm visualized.   DIAGNOSES:  1. Lower abdominal pain likely secondary to irritable bowel,      constipation predominant.  2. Should be internal hemorrhoids likely cause for rectal bleeding.   RECOMMENDATIONS:  1. She should drink 6-8 cups of water daily.  She should continue on      high-fiber diet.  She is given a handout on high-fiber diet,      constipation, and hemorrhoids.  2. She may restart her Plavix today.  3. Flexible sigmoidoscopy in next week with a Half-Lytely prep.  4. Told Tonya Carpenter that she needs to follow a clear liquid diet prior to      her procedure.  5. Flexible sigmoidoscopy with Half-Lytely bowel prep.  She should      take half her Lantus dose on the day before her procedure.  She      should hold her Lantus on the morning of her  procedure.   MEDICATIONS:  1. Demerol 100 mg IV.  2. Versed 6 mg IV.   PROCEDURE TECHNIQUE:  Physical exam was performed.  Informed consent was  obtained from the patient explaining the benefits, risks, and  alternatives to the procedure.  The patient was connected to the monitor  and placed in left lateral position.  Continuous oxygen was provided by  nasal cannula.  IV medicine administered through an indwelling cannula.  After administration of sedation and rectal exam, the patient's rectum  was intubated  and the scope was advanced under direct visualization to the distal  terminal ileum.  The scope was removed slowly by careful examining the  color, texture, anatomy, and integrity of mucosa on the way out.  The  patient was recovered in endoscopy  and discharged home in satisfactory  condition.      Kassie Mends, M.D.  Electronically Signed     SM/MEDQ  D:  08/29/2008  T:  08/30/2008  Job:  [id number]   cc:   Angus G. Renard Matter, MD  Fax: [phone]

## 2010-08-03 NOTE — Assessment & Plan Note (Signed)
[facility]                       [facility] OFFICE NOTE   Tonya, Carpenter                          MRN:          [id number]  DATE:06/24/2006                            DOB:          [birth year]    Tonya Carpenter is a delightful 65 year old patient of Dr. Renard Carpenter.  She is  referred for chest pain.  She has multiple cardiac risk factors,  including diabetes, hypertension, hypercholesterolemia.  I actually have  taken care of her sister in the past.   The patient was started on Isordil recently.   She has had pain in the past, but it has intensified over the last 2  weeks.  It does respond to the isosorbide and to nitroglycerin.  The  patient has only lasted a minute or two, in can be intermittent during  the day.  It can be exertional, but also non exertional.   The patient has no associated diaphoresis, PND, or orthopnea.  She has  not had syncope or palpitations.   She has been compliant with her meds.  She was a bit emotional today.  She is apparently somewhat stressed about life in general.  Her sister  says she has a hard time not worrying about her 2 sons, they are 73 and  39, and she, apparently, is still trying to hold on too tight.   The patient's 10-point review of systems otherwise negative.   She denies any allergies.   She is currently taking Isordil 30 a day, Accupril 40 a day, Vytorin  10/20, and aspirin a day, Lantus 75 units in the morning and a sliding  scale Humalog.   She has had previous hysterectomy and gallbladder surgery.  She does not  smoke or drink alcohol.   She has been treated for her hypercholesterolemia and hypertension for  at least 3 years.   The patient is married.  Her husband is with her today.  She has 2 sons  as indicated.  She is fairly sedentary.  She does not exercise.  They  live on social security.   PHYSICAL EXAMINATION:  The patient's blood pressure was equal to 150/80,  pulse 72 and  regular.  HEENT:  Normal.  Carotids are normal without bruit.  Thyroid is not palpable.  LUNGS:  Clear.  There is an S1, S2, distant heart sounds.  ABDOMEN:  Benign.  LOWER EXTREMITIES:  Intact pulses, no edema.  NEURO:  Nonfocal.  SKIN:  Warm and dry.   EKG shows sinus rhythm with fairly marked LVH in the limb leads.  There  are T-wave inversions laterally.  This is somewhat different than the  previous EKG that I have in the chart; however, I cannot find a date on  that EKG.   IMPRESSION:  A 65 year old diabetic with EKG changes and chest pain,  responsive to nitroglycerin.  Clearly, the patient needs a heart cath.   I explained the risk of catheterization extensively to the patient, the  husband and the sister.  These included stroke, a dye reaction, a need  for emergency surgery, hematoma, and myocardial infarction.  Although  she is anxious, she is wiling to proceed.  The patient will continue to  take her Isordil which was started recently.  Her blood pressure seems  to be under reasonable control with Accupril.  We will continue her  Vytorin for hypercholesterolemia.  The patient will hold her Lantus the  morning of the heart cath.  We will check her sugar on arrival and cover  her with regular insulin if needed.  She will take her blood pressure  medicine prior to coming.   She will have routine labs and an x-ray today.  Further recommendation  will be based on the results of her heart cath.     Tonya Pick. Eden Emms, MD, [facility]  Electronically Signed    PCN/MedQ  DD: 06/24/2006  DT: 06/24/2006  Job #: [id number]   cc:   Tonya G. Renard Matter, MD

## 2010-08-03 NOTE — Cardiovascular Report (Signed)
NAME:  Tonya Carpenter, Tonya Carpenter NO.:  [id number]   MEDICAL RECORD NO.:  [id number]          PATIENT TYPE:  OIB   LOCATION:  2859                         FACILITY:  [facility]   PHYSICIAN:  Noralyn Pick. Eden Emms, MD, FACCDATE OF BIRTH:  [birth year]   DATE OF PROCEDURE:  07/09/2006  DATE OF DISCHARGE:                            CARDIAC CATHETERIZATION   PROCEDURE:  Coronary arteriography.   INDICATIONS:  A 65 year old diabetic with EKG changes and chest pain,  severe hypertension.   Cine catheterization was done via 5-French catheters from the right  femoral artery.   The left main coronary artery was normal.   Left anterior descending artery was normal.  It was a large-caliber  vessel.  There was an angulated section of the distal LAD which did not  have any significant stenosis, however.  The LAD was a large artery and  wrapped the apex.  The circumflex coronary artery was dominant.  There  was a high-takeoff large first obtuse marginal branch, which was normal.  The the patient had a smaller PDA and two posterior lateral branches.   The right coronary artery was nondominant and normal.   RAO ventriculography:  RAO ventriculography was somewhat interesting.  The patient had hyperdynamic function in the mid and apical section.  The base appeared more spade-like.  Overall the ejection fraction was  80%.  There did not appear to be a dynamic gradient between the apex and  base on pullback.   The patient had been treated in the lab with 10 mg of hydralazine and a  1.25 mg of enalaprilat.  Her LV pressure is 185/18, AO pressure was  185/84.   Selective renal injections for her severe hypertension showed the right  renal artery to be widely patent.  It was a somewhat smaller-caliber  vessel than the left.  There was a single left renal artery, which is  also widely patent and fair bit larger than the right.   There was no evidence of renal artery stenosis.   IMPRESSION:  The  patient's problem would appear to be essential  hypertension.  I think her EKG changes represent this.  We may need to  go back and re-look at her echo or specifically Doppler her to see if  there is any  midcavitary gradient.  I will take a look at the patient's medications.  Her resting heart rate tends to be in the 56 range, so I am not sure she  can tolerate a beta blocker.  However, the addition of hydralazine  and/or calcium blocker may be in order to better control her pressure  and possibly decrease any potential for midcavitary gradient.      Noralyn Pick. Eden Emms, MD, [facility]  Electronically Signed     PCN/MEDQ  D:  07/09/2006  T:  07/09/2006  Job:  [id number]   cc:   [facility] Office

## 2010-12-24 LAB — URINALYSIS, ROUTINE W REFLEX MICROSCOPIC
Bilirubin Urine: NEGATIVE
Ketones, ur: NEGATIVE
Specific Gravity, Urine: 1.01
pH: 7

## 2010-12-24 LAB — URINE MICROSCOPIC-ADD ON

## 2011-02-11 ENCOUNTER — Other Ambulatory Visit: Payer: Self-pay | Admitting: Obstetrics and Gynecology

## 2011-02-11 DIAGNOSIS — Z139 Encounter for screening, unspecified: Secondary | ICD-10-CM

## 2011-02-19 ENCOUNTER — Encounter (HOSPITAL_COMMUNITY): Payer: Self-pay | Admitting: Dietician

## 2011-02-19 NOTE — Progress Notes (Signed)
[facility] Diabetes Class Completion  Date:February 19, 2011  Time: 10 AM  Pt attended [facility]'s Diabetes Class on February 19, 2011.   Patient was educated on the following topics: carbohydrate metabolism in relation to diabetes, sources of carbohydrate, carbohydrate counting, meal planning strategies, food label reading, and portion control.   Melody Haver, RD, LDN Date:February 19, 2011 Time: 10 AM

## 2011-03-13 ENCOUNTER — Encounter: Payer: Self-pay | Admitting: Cardiology

## 2011-03-18 ENCOUNTER — Ambulatory Visit (HOSPITAL_COMMUNITY)
Admission: RE | Admit: 2011-03-18 | Discharge: 2011-03-18 | Disposition: A | Discharge status: Home / Self Care | Payer: Self-pay | Source: Ambulatory Visit | Attending: Obstetrics and Gynecology | Admitting: Obstetrics and Gynecology

## 2011-03-18 DIAGNOSIS — Z139 Encounter for screening, unspecified: Secondary | ICD-10-CM

## 2011-09-04 ENCOUNTER — Other Ambulatory Visit (HOSPITAL_COMMUNITY): Payer: Self-pay | Admitting: Physician Assistant

## 2011-09-04 ENCOUNTER — Ambulatory Visit (HOSPITAL_COMMUNITY)
Admission: RE | Admit: 2011-09-04 | Discharge: 2011-09-04 | Disposition: A | Discharge status: Home / Self Care | Payer: Self-pay | Source: Ambulatory Visit | Attending: Physician Assistant | Admitting: Physician Assistant

## 2011-09-04 DIAGNOSIS — W19XXXA Unspecified fall, initial encounter: Secondary | ICD-10-CM

## 2011-09-04 DIAGNOSIS — M25519 Pain in unspecified shoulder: Secondary | ICD-10-CM | POA: Insufficient documentation

## 2011-09-04 DIAGNOSIS — T1490XA Injury, unspecified, initial encounter: Secondary | ICD-10-CM | POA: Insufficient documentation

## 2011-09-09 ENCOUNTER — Emergency Department (HOSPITAL_COMMUNITY)
Admission: EM | Admit: 2011-09-09 | Discharge: 2011-09-09 | Disposition: A | Discharge status: Home / Self Care | Payer: Self-pay | Attending: Emergency Medicine | Admitting: Emergency Medicine

## 2011-09-09 ENCOUNTER — Emergency Department (HOSPITAL_COMMUNITY): Payer: Self-pay

## 2011-09-09 ENCOUNTER — Encounter (HOSPITAL_COMMUNITY): Payer: Self-pay | Admitting: *Deleted

## 2011-09-09 DIAGNOSIS — R059 Cough, unspecified: Secondary | ICD-10-CM | POA: Insufficient documentation

## 2011-09-09 DIAGNOSIS — R079 Chest pain, unspecified: Secondary | ICD-10-CM | POA: Insufficient documentation

## 2011-09-09 DIAGNOSIS — Z79899 Other long term (current) drug therapy: Secondary | ICD-10-CM | POA: Insufficient documentation

## 2011-09-09 DIAGNOSIS — R05 Cough: Secondary | ICD-10-CM | POA: Insufficient documentation

## 2011-09-09 DIAGNOSIS — R0602 Shortness of breath: Secondary | ICD-10-CM | POA: Insufficient documentation

## 2011-09-09 DIAGNOSIS — E119 Type 2 diabetes mellitus without complications: Secondary | ICD-10-CM | POA: Insufficient documentation

## 2011-09-09 DIAGNOSIS — E785 Hyperlipidemia, unspecified: Secondary | ICD-10-CM | POA: Insufficient documentation

## 2011-09-09 DIAGNOSIS — I252 Old myocardial infarction: Secondary | ICD-10-CM | POA: Insufficient documentation

## 2011-09-09 DIAGNOSIS — I10 Essential (primary) hypertension: Secondary | ICD-10-CM | POA: Insufficient documentation

## 2011-09-09 DIAGNOSIS — Z794 Long term (current) use of insulin: Secondary | ICD-10-CM | POA: Insufficient documentation

## 2011-09-09 LAB — COMPREHENSIVE METABOLIC PANEL
ALT: 11 U/L (ref 0–35)
AST: 15 U/L (ref 0–37)
Alkaline Phosphatase: 52 U/L (ref 39–117)
CO2: 28 mEq/L (ref 19–32)
Calcium: 9.9 mg/dL (ref 8.4–10.5)
Chloride: 99 mEq/L (ref 96–112)
GFR calc non Af Amer: 52 mL/min — ABNORMAL LOW (ref >90)
Potassium: 3.7 mEq/L (ref 3.5–5.1)
Sodium: 136 mEq/L (ref 135–145)
Total Bilirubin: 0.2 mg/dL — ABNORMAL LOW (ref 0.3–1.2)

## 2011-09-09 LAB — CBC
Hemoglobin: 12.2 g/dL (ref 12.0–15.0)
Platelets: 233 10*3/uL (ref 150–400)
RBC: 4.54 MIL/uL (ref 3.87–5.11)
WBC: 11.8 10*3/uL — ABNORMAL HIGH (ref 4.0–10.5)

## 2011-09-09 MED ORDER — HYDROCODONE-ACETAMINOPHEN 5-325 MG PO TABS: 1.0000 | ORAL_TABLET | Freq: Four times a day (QID) | ORAL | Status: AC | PRN

## 2011-09-09 NOTE — ED Provider Notes (Signed)
History  This chart was scribed for Shelda Jakes, MD by Bennett Scrape. This patient was seen in room [facility] and the patient's care was started at 12:14PM.  CSN: [id number]  Arrival date & time 09/09/11  1140   First MD Initiated Contact with Patient 09/09/11 1214      Chief Complaint  Patient presents with  . Chest Pain    Patient is a 66 y.o. female presenting with chest pain. The history is provided by the patient. No language interpreter was used.  Chest Pain The chest pain began 2 days ago. Chest pain occurs constantly. The chest pain is worsening. The quality of the pain is described as heavy. The pain radiates to the upper back. Primary symptoms include shortness of breath and cough. Pertinent negatives for primary symptoms include no fever, no abdominal pain, no nausea and no vomiting. She tried nitroglycerin for the symptoms.  Her past medical history is significant for diabetes, hyperlipidemia, hypertension and MI.     Tonya Carpenter is a 66 y.o. female who presents to the Emergency Department complaining of 2 days of gradual onset, gradually worsening, constant substernal chest pain described as a heaviness. She states that the pain radiates to the upper back pain. She denies having any modifying factors.She reports that she took one nitroglycerin yesterday with no improvement in the symptoms. She reports having similar chest pain before but was unclear about the circumstances. Pt believes that the chest pain could be related to a fall she had 8 days ago. Pt states that she had a slip and fall in her bathtub in which she landed on her right shoulder. She denies having immediate chest pain or similar chest pain episodes after the fall until 2 days ago. She was sent from her PCP on 09/04/11 to have a chest x-ray at [facility] but states that she was never told about the results of the x-ray. She reports that she has chronic SOB and productive cough of white sputum. She denies fever,  nausea, emesis, HA, dysuria and back pain as associated symptoms. She also has a h/o HTN, DM and HLD. She denies smoking and alcohol use.  [facility] [place].   Past Medical History  Diagnosis Date  . Hypertension   . Diabetes mellitus   . Hyperlipidemia   . Chest pain   MI per pt at bedside  Past Surgical History  Procedure Date  . Total abdominal hysterectomy   . Tubal ligation   . Cholecystectomy   Cardiac cath per pt at bedside  History reviewed. No pertinent family history.  History  Substance Use Topics  . Smoking status: Never Smoker   . Smokeless tobacco: Not on file  . Alcohol Use: No    No OB history provided.  Review of Systems  Constitutional: Negative for fever and chills.  HENT: Negative for congestion, sore throat and neck pain.   Eyes: Negative for visual disturbance.  Respiratory: Positive for cough and shortness of breath.   Cardiovascular: Positive for chest pain.  Gastrointestinal: Negative for nausea, vomiting, abdominal pain and diarrhea.  Genitourinary: Negative for dysuria.  Musculoskeletal: Negative for back pain.  Skin: Negative for rash.  Neurological: Negative for headaches.  Hematological: Does not bruise/bleed easily.  Psychiatric/Behavioral: Negative for confusion.    Allergies  Review of patient's allergies indicates no known allergies.  Home Medications   Current Outpatient Rx  Name Route Sig Dispense Refill  . ACETAMINOPHEN 500 MG PO TABS Oral Take 1,000 mg  by mouth every 6 (six) hours as needed. For pain    . CLOPIDOGREL BISULFATE 75 MG PO TABS Oral Take 75 mg by mouth daily.      Marland Kitchen FIBER PO POWD Oral Take by mouth 2 (two) times daily.      Marland Kitchen HYDRALAZINE HCL 10 MG PO TABS Oral Take 10 mg by mouth daily.      . INSULIN GLARGINE 100 UNIT/ML [place] SOLN Subcutaneous Inject 45 Units into the skin every evening.     . INSULIN LISPRO (HUMAN) 100 UNIT/ML [place] SOLN Subcutaneous Inject into the skin as directed.      Marland Kitchen METOPROLOL  SUCCINATE ER 100 MG PO TB24 Oral Take 100 mg by mouth daily.      Marland Kitchen NITROGLYCERIN 0.4 MG SL SUBL Sublingual Place 0.4 mg under the tongue every 5 (five) minutes as needed. For chest pain    . PRAVASTATIN SODIUM 20 MG PO TABS Oral Take 20 mg by mouth daily.    . QUINAPRIL-HYDROCHLOROTHIAZIDE 20-12.5 MG PO TABS Oral Take 1 tablet by mouth daily.    Marland Kitchen HYDROCODONE-ACETAMINOPHEN 5-325 MG PO TABS Oral Take 1-2 tablets by mouth every 6 (six) hours as needed for pain. 10 tablet 0    Triage Vitals: BP 144/59  Pulse 78  Temp 98.5 F (36.9 C) (Oral)  Resp 20  Wt 183 lb (83.008 kg)  SpO2 95%  Physical Exam  Nursing note and vitals reviewed. Constitutional: She is oriented to person, place, and time. She appears well-developed and well-nourished. No distress.  HENT:  Head: Normocephalic and atraumatic.  Eyes: EOM are normal.  Neck: Neck supple. No tracheal deviation present.  Cardiovascular: Normal rate and regular rhythm.   No murmur heard. Pulmonary/Chest: Effort normal and breath sounds normal. No respiratory distress.  Abdominal: Soft. Bowel sounds are normal. There is no tenderness.  Musculoskeletal: Normal range of motion. She exhibits no edema.  Neurological: She is alert and oriented to person, place, and time.  Skin: Skin is warm and dry.  Psychiatric: She has a normal mood and affect. Her behavior is normal.    ED Course  Procedures (including critical care time)  DIAGNOSTIC STUDIES: Oxygen Saturation is 95% on room air, adequate by my interpretation.    COORDINATION OF CARE: 1:09PM-Discussed treatment plan which includes blood work with pt and pt agreed to plan.  Labs Reviewed  CBC - Abnormal; Notable for the following:    WBC 11.8 (*)     All other components within normal limits  COMPREHENSIVE METABOLIC PANEL - Abnormal; Notable for the following:    Glucose, Bld 255 (*)     Total Bilirubin 0.2 (*)     GFR calc non Af Amer 52 (*)     GFR calc Af Amer 61 (*)     All  other components within normal limits  POCT I-STAT TROPONIN I   Dg Chest 2 View  09/09/2011  *RADIOLOGY REPORT*  Clinical Data: Chest pain, history hypertension, diabetes, hyperlipidemia  CHEST - 2 VIEW  Comparison: 01/13/2010, 01/14/2010  Findings: Upper-normal size of cardiac silhouette. Mediastinal contours and pulmonary vascularity normal. Numerous cardiac monitoring leads project over chest. Lungs appear grossly clear. No pleural effusion or pneumothorax. No acute osseous findings. Dextroconvex thoracic scoliosis, increased.  IMPRESSION: No acute abnormalities.  Original Report Authenticated By: Lollie Marrow, M.D.   Results for orders placed during the hospital encounter of 09/09/11  CBC      Component Value Range   WBC 11.8 (*)  4.0 - 10.5 K/uL   RBC 4.54  3.87 - 5.11 MIL/uL   Hemoglobin 12.2  12.0 - 15.0 g/dL   HCT 40.9  81.1 - 91.4 %   MCV 85.7  78.0 - 100.0 fL   MCH 26.9  26.0 - 34.0 pg   MCHC 31.4  30.0 - 36.0 g/dL   RDW 78.2  95.6 - 21.3 %   Platelets 233  150 - 400 K/uL  COMPREHENSIVE METABOLIC PANEL      Component Value Range   Sodium 136  135 - 145 mEq/L   Potassium 3.7  3.5 - 5.1 mEq/L   Chloride 99  96 - 112 mEq/L   CO2 28  19 - 32 mEq/L   Glucose, Bld 255 (*) 70 - 99 mg/dL   BUN 18  6 - 23 mg/dL   Creatinine, Ser 0.86  0.50 - 1.10 mg/dL   Calcium 9.9  8.4 - 57.8 mg/dL   Total Protein 7.5  6.0 - 8.3 g/dL   Albumin 3.5  3.5 - 5.2 g/dL   AST 15  0 - 37 U/L   ALT 11  0 - 35 U/L   Alkaline Phosphatase 52  39 - 117 U/L   Total Bilirubin 0.2 (*) 0.3 - 1.2 mg/dL   GFR calc non Af Amer 52 (*) >90 mL/min   GFR calc Af Amer 61 (*) >90 mL/min  POCT I-STAT TROPONIN I      Component Value Range   Troponin i, poc 0.00  0.00 - 0.08 ng/mL   Comment 3              1. CHEST PAIN UNSPECIFIED       MDM  Chest pain workup in the emergency department without any acute or specific findings. Chest x-ray negative for pneumonia or pneumothorax troponins negative EKG without  acute changes liver function tests are normal patient has had a history of chest pain in the past his history of diabetes blood sugar is a little elevated today patient to follow back up with her clinic we'll give a hydrocodone as needed for increased pain. Also reviewed her shoulder x-ray from a few days ago that was also negative.      I personally performed the services described in this documentation, which was scribed in my presence. The recorded information has been reviewed and considered.     Shelda Jakes, MD 09/09/11 1440

## 2011-09-09 NOTE — ED Notes (Signed)
Chest pain for 2-3 days, center of ant chest with sob,

## 2011-09-09 NOTE — Discharge Instructions (Signed)
Chest Pain (Nonspecific) It is often hard to give a specific diagnosis for the cause of chest pain. There is always a chance that your pain could be related to something serious, such as a heart attack or a blood clot in the lungs. You need to follow up with your caregiver for further evaluation. CAUSES   Heartburn.   Pneumonia or bronchitis.   Anxiety or stress.   Inflammation around your heart (pericarditis) or lung (pleuritis or pleurisy).   A blood clot in the lung.   A collapsed lung (pneumothorax). It can develop suddenly on its own (spontaneous pneumothorax) or from injury (trauma) to the chest.   Shingles infection (herpes zoster virus).  The chest wall is composed of bones, muscles, and cartilage. Any of these can be the source of the pain.  The bones can be bruised by injury.   The muscles or cartilage can be strained by coughing or overwork.   The cartilage can be affected by inflammation and become sore (costochondritis).  DIAGNOSIS  Lab tests or other studies, such as X-rays, electrocardiography, stress testing, or cardiac imaging, may be needed to find the cause of your pain.  TREATMENT   Treatment depends on what may be causing your chest pain. Treatment may include:   Acid blockers for heartburn.   Anti-inflammatory medicine.   Pain medicine for inflammatory conditions.   Antibiotics if an infection is present.   You may be advised to change lifestyle habits. This includes stopping smoking and avoiding alcohol, caffeine, and chocolate.   You may be advised to keep your head raised (elevated) when sleeping. This reduces the chance of acid going backward from your stomach into your esophagus.   Most of the time, nonspecific chest pain will improve within 2 to 3 days with rest and mild pain medicine.  HOME CARE INSTRUCTIONS   If antibiotics were prescribed, take your antibiotics as directed. Finish them even if you start to feel better.   For the next few  days, avoid physical activities that bring on chest pain. Continue physical activities as directed.   Do not smoke.   Avoid drinking alcohol.   Only take over-the-counter or prescription medicine for pain, discomfort, or fever as directed by your caregiver.   Follow your caregiver's suggestions for further testing if your chest pain does not go away.   Keep any follow-up appointments you made. If you do not go to an appointment, you could develop lasting (chronic) problems with pain. If there is any problem keeping an appointment, you must call to reschedule.  SEEK MEDICAL CARE IF:   You think you are having problems from the medicine you are taking. Read your medicine instructions carefully.   Your chest pain does not go away, even after treatment.   You develop a rash with blisters on your chest.  SEEK IMMEDIATE MEDICAL CARE IF:   You have increased chest pain or pain that spreads to your arm, neck, jaw, back, or abdomen.   You develop shortness of breath, an increasing cough, or you are coughing up blood.   You have severe back or abdominal pain, feel nauseous, or vomit.   You develop severe weakness, fainting, or chills.   You have a fever.  THIS IS AN EMERGENCY. Do not wait to see if the pain will go away. Get medical help at once. Call your local emergency services (911 in [place] Do not drive yourself to the hospital. MAKE SURE YOU:   Understand these instructions.     Will watch your condition.   Will get help right away if you are not doing well or get worse.  Document Released: 12/12/2004 Document Revised: 02/21/2011 Document Reviewed: 10/08/2007 [facility] Patient Information 2012 [place], [place].  Workup today is negative for acute cardiac event chest x-ray shows no signs of pneumonia or pneumothorax x-ray of the shoulder from the other day was normal. Followup with the free clinic in the next few days take pain medicine as needed for increased pain return for new or  worse symptoms.

## 2011-09-09 NOTE — ED Notes (Signed)
Family at bedside. 

## 2011-09-09 NOTE — ED Notes (Signed)
Patient was repositioned in bed and given some water to drink. Drink was okayed by [employer].

## 2012-06-10 ENCOUNTER — Encounter: Payer: Self-pay | Admitting: Family Medicine

## 2012-06-23 ENCOUNTER — Encounter: Payer: Self-pay | Admitting: Nurse Practitioner

## 2012-06-29 ENCOUNTER — Ambulatory Visit: Payer: Self-pay | Admitting: Family Medicine

## 2012-07-09 ENCOUNTER — Telehealth: Payer: Self-pay | Admitting: Family Medicine

## 2012-07-09 NOTE — Telephone Encounter (Signed)
Pt will come in fasting

## 2012-07-10 ENCOUNTER — Encounter: Payer: Self-pay | Admitting: Family Medicine

## 2012-07-10 ENCOUNTER — Ambulatory Visit (INDEPENDENT_AMBULATORY_CARE_PROVIDER_SITE_OTHER): Payer: Medicare Other | Admitting: Family Medicine

## 2012-07-10 ENCOUNTER — Telehealth: Payer: Self-pay | Admitting: Family Medicine

## 2012-07-10 VITALS — BP 173/75 | HR 64 | Temp 96.8°F | Ht 61.0 in | Wt 184.6 lb

## 2012-07-10 DIAGNOSIS — I2510 Atherosclerotic heart disease of native coronary artery without angina pectoris: Secondary | ICD-10-CM

## 2012-07-10 DIAGNOSIS — I10 Essential (primary) hypertension: Secondary | ICD-10-CM

## 2012-07-10 DIAGNOSIS — E119 Type 2 diabetes mellitus without complications: Secondary | ICD-10-CM

## 2012-07-10 DIAGNOSIS — E785 Hyperlipidemia, unspecified: Secondary | ICD-10-CM

## 2012-07-10 LAB — HEPATIC FUNCTION PANEL
ALT: 19 U/L (ref 0–35)
AST: 20 U/L (ref 0–37)
Albumin: 4.1 g/dL (ref 3.5–5.2)
Alkaline Phosphatase: 57 U/L (ref 39–117)
Bilirubin, Direct: 0.1 mg/dL (ref 0.0–0.3)
Indirect Bilirubin: 0.5 mg/dL (ref 0.0–0.9)
Total Bilirubin: 0.6 mg/dL (ref 0.3–1.2)
Total Protein: 7.4 g/dL (ref 6.0–8.3)

## 2012-07-10 LAB — BASIC METABOLIC PANEL WITH GFR
BUN: 16 mg/dL (ref 6–23)
CO2: 30 mEq/L (ref 19–32)
Calcium: 9.6 mg/dL (ref 8.4–10.5)
Chloride: 102 mEq/L (ref 96–112)
Creat: 1.19 mg/dL — ABNORMAL HIGH (ref 0.50–1.10)
GFR, Est African American: 55 mL/min — ABNORMAL LOW
GFR, Est Non African American: 48 mL/min — ABNORMAL LOW
Glucose, Bld: 109 mg/dL — ABNORMAL HIGH (ref 70–99)
Potassium: 3.7 mEq/L (ref 3.5–5.3)
Sodium: 141 mEq/L (ref 135–145)

## 2012-07-10 LAB — POCT GLYCOSYLATED HEMOGLOBIN (HGB A1C): Hemoglobin A1C: 6.7

## 2012-07-10 LAB — POCT UA - MICROALBUMIN: Microalbumin Ur, POC: 20 mg/dL

## 2012-07-10 LAB — TSH: TSH: 1.476 u[IU]/mL (ref 0.350–4.500)

## 2012-07-10 MED ORDER — METOPROLOL SUCCINATE ER 100 MG PO TB24: 100.0000 mg | ORAL_TABLET | Freq: Every day | ORAL | Status: DC

## 2012-07-10 MED ORDER — INSULIN GLARGINE 100 UNIT/ML ~~LOC~~ SOLN: 45.0000 [IU] | Freq: Every evening | SUBCUTANEOUS | Status: DC

## 2012-07-10 MED ORDER — INSULIN LISPRO 100 UNIT/ML ~~LOC~~ SOLN: SUBCUTANEOUS | Status: DC

## 2012-07-10 MED ORDER — QUINAPRIL-HYDROCHLOROTHIAZIDE 20-12.5 MG PO TABS: 1.0000 | ORAL_TABLET | Freq: Every day | ORAL | Status: DC

## 2012-07-10 MED ORDER — CLOPIDOGREL BISULFATE 75 MG PO TABS: 75.0000 mg | ORAL_TABLET | Freq: Every day | ORAL | Status: DC

## 2012-07-10 MED ORDER — PRAVASTATIN SODIUM 20 MG PO TABS: 20.0000 mg | ORAL_TABLET | Freq: Every day | ORAL | Status: DC

## 2012-07-10 MED ORDER — HYDRALAZINE HCL 10 MG PO TABS: 10.0000 mg | ORAL_TABLET | Freq: Every day | ORAL | Status: DC

## 2012-07-10 MED ORDER — ATORVASTATIN CALCIUM 20 MG PO TABS: 20.0000 mg | ORAL_TABLET | Freq: Every day | ORAL | Status: DC

## 2012-07-10 NOTE — Telephone Encounter (Signed)
Pt aware to make appt with pharmacist

## 2012-07-10 NOTE — Patient Instructions (Addendum)
      Dr Sharilynn Cassity's Recommendations  Diet and Exercise discussed with patient.  For nutrition information, I recommend books:  1).Eat to Live by Dr Joel Fuhrman. 2).Prevent and Reverse Heart Disease by Dr Caldwell Esselstyn.  Exercise recommendations are:  If unable to walk, then the patient can exercise in a chair 3 times a day. By flapping arms like a bird gently and raising legs outwards to the front.  If ambulatory, the patient can go for walks for 30 minutes 3 times a week. Then increase the intensity and duration as tolerated.  Goal is to try to attain exercise frequency to 5 times a week.  If applicable: Best to perform resistance exercises (machines or weights) 2 days a week and cardio type exercises 3 days per week.  

## 2012-07-10 NOTE — Progress Notes (Signed)
Patient ID: Tonya Carpenter, female   DOB: [birth year], 67 y.o.   MRN: [id number] SUBJECTIVE: HPI: Patient is here for follow up of hypertension Diabetes/hyperlipidemia/CAD: denies Headache;deniesChest Pain;denies weakness;denies Shortness of Breath or Orthopnea;denies Visual changes;denies palpitations;denies cough;denies pedal edema;denies symptoms of TIA or stroke; admits to Compliance with medications. denies Problems with medications. Hasn't taken her medications yet for today   PMH/PSH: reviewed/updated in Epic  SH/FH: reviewed/updated in Epic  Allergies: reviewed/updated in Epic  Medications: reviewed/updated in Epic  Immunizations: reviewed/updated in Epic  ROS: As above in the HPI. All other systems are stable or negative.  OBJECTIVE: APPEARANCE:  Patient in no acute distress.The patient appeared well nourished and normally developed. Acyanotic. Waist: VITAL SIGNS:  SKIN: warm and  Dry without overt rashes, tattoos and scars  HEAD and Neck: without JVD, Head and scalp: normal Eyes:No scleral icterus. Fundi normal, eye movements normal. Ears: Auricle normal, canal normal, Tympanic membranes normal, insufflation normal. Nose: normal Throat: normal Neck & thyroid: normal  CHEST & LUNGS: Chest wall: normal Lungs: Clear  CVS: Reveals the PMI to be normally located. Regular rhythm, First and Second Heart sounds are normal,  absence of murmurs, rubs or gallops. Peripheral vasculature: Radial pulses: normal Dorsal pedis pulses: normal Posterior pulses: normal  ABDOMEN:  Appearance: normal Benign,, no organomegaly, no masses, no Abdominal Aortic enlargement. No Guarding , no rebound. No Bruits. Bowel sounds: normal  RECTAL: N/A GU: N/A  EXTREMETIES: nonedematous. Both Femoral and Pedal pulses are normal.  MUSCULOSKELETAL:  Spine: normal Joints: intact  NEUROLOGIC: oriented to time,place and person; nonfocal. Strength is normal Sensory is  normal Reflexes are normal Cranial Nerves are normal.  ASSESSMENT: DIABETES MELLITUS - Plan: POCT glycosylated hemoglobin (Hb A1C), POCT UA - Microalbumin, TSH, insulin glargine (LANTUS) 100 UNIT/ML injection, insulin lispro (HUMALOG) 100 UNIT/ML injection  HYPERLIPIDEMIA-MIXED - Plan: Hepatic function panel, NMR Lipoprofile with Lipids, TSH, atorvastatin (LIPITOR) 20 MG tablet  HYPERTENSION, UNSPECIFIED - Plan: BASIC METABOLIC PANEL WITH GFR, TSH, hydrALAZINE (APRESOLINE) 10 MG tablet, metoprolol succinate (TOPROL-XL) 100 MG 24 hr tablet, quinapril-hydrochlorothiazide (ACCURETIC) 20-12.5 MG per tablet  CAD, multiple vessel - Plan: TSH, atorvastatin (LIPITOR) 20 MG tablet, clopidogrel (PLAVIX) 75 MG tablet, metoprolol succinate (TOPROL-XL) 100 MG 24 hr tablet, pravastatin (PRAVACHOL) 20 MG tablet  BP is elevated due to patient coming fasting.  PLAN: Orders Placed This Encounter  Procedures  . BASIC METABOLIC PANEL WITH GFR  . Hepatic function panel  . NMR Lipoprofile with Lipids  . TSH  . POCT glycosylated hemoglobin (Hb A1C)  . POCT UA - Microalbumin   Results for orders placed in visit on 07/10/12 (from the past 24 hour(s))  POCT UA - MICROALBUMIN     Status: None   Collection Time    07/10/12 12:04 PM      Result Value Range   Microalbumin Ur, POC 20 mg/l    POCT GLYCOSYLATED HEMOGLOBIN (HGB A1C)     Status: None   Collection Time    07/10/12 12:08 PM      Result Value Range   Hemoglobin A1C 6.7%     Meds ordered this encounter  Medications  . DISCONTD: atorvastatin (LIPITOR) 20 MG tablet    Sig: Take 20 mg by mouth daily.  Marland Kitchen atorvastatin (LIPITOR) 20 MG tablet    Sig: Take 1 tablet (20 mg total) by mouth daily.    Dispense:  30 tablet    Refill:  5  . clopidogrel (PLAVIX) 75 MG tablet  Sig: Take 1 tablet (75 mg total) by mouth daily.    Dispense:  30 tablet    Refill:  5  . hydrALAZINE (APRESOLINE) 10 MG tablet    Sig: Take 1 tablet (10 mg total) by mouth  daily.    Dispense:  30 tablet    Refill:  5  . insulin glargine (LANTUS) 100 UNIT/ML injection    Sig: Inject 0.45 mLs (45 Units total) into the skin every evening.    Dispense:  20 mL    Refill:  11  . insulin lispro (HUMALOG) 100 UNIT/ML injection    Sig: Use as directed  On sliding scale    Dispense:  20 mL    Refill:  11  . metoprolol succinate (TOPROL-XL) 100 MG 24 hr tablet    Sig: Take 1 tablet (100 mg total) by mouth daily.    Dispense:  30 tablet    Refill:  5  . pravastatin (PRAVACHOL) 20 MG tablet    Sig: Take 1 tablet (20 mg total) by mouth daily.    Dispense:  30 tablet    Refill:  5  . quinapril-hydrochlorothiazide (ACCURETIC) 20-12.5 MG per tablet    Sig: Take 1 tablet by mouth daily.    Dispense:  30 tablet    Refill:  5       Dr Woodroe Mode Recommendations  Diet and Exercise discussed with patient.  For nutrition information, I recommend books:  1).Eat to Live by Dr Monico Hoar. 2).Prevent and Reverse Heart Disease by Dr Suzzette Righter.  Exercise recommendations are:  If unable to walk, then the patient can exercise in a chair 3 times a day. By flapping arms like a bird gently and raising legs outwards to the front.  If ambulatory, the patient can go for walks for 30 minutes 3 times a week. Then increase the intensity and duration as tolerated.  Goal is to try to attain exercise frequency to 5 times a week.  If applicable: Best to perform resistance exercises (machines or weights) 2 days a week and cardio type exercises 3 days per week.  RTC 2 months  Eryka Dolinger P. Modesto Charon, M.D.

## 2012-07-10 NOTE — Telephone Encounter (Signed)
I had her make an appointment to see the Pharmacist. So wait until she sees them

## 2012-07-13 LAB — NMR LIPOPROFILE WITH LIPIDS
Cholesterol, Total: 148 mg/dL (ref <200)
HDL Particle Number: 37.7 umol/L (ref >=30.5)
HDL Size: 8.8 nm — ABNORMAL LOW (ref >=9.2)
HDL-C: 51 mg/dL (ref >=40)
LDL (calc): 80 mg/dL (ref <100)
LDL Particle Number: 1086 nmol/L — ABNORMAL HIGH (ref <1000)
LDL Size: 21 nm (ref >20.5)
LP-IR Score: 40 (ref <=45)
Large HDL-P: 6.3 umol/L (ref >=4.8)
Large VLDL-P: 1.1 nmol/L (ref <=2.7)
Small LDL Particle Number: 498 nmol/L (ref <=527)
Triglycerides: 85 mg/dL (ref <150)
VLDL Size: 42.8 nm (ref <=46.6)

## 2012-07-14 ENCOUNTER — Telehealth: Payer: Self-pay | Admitting: *Deleted

## 2012-07-14 NOTE — Telephone Encounter (Signed)
Please have Pharmacist take care of this tomorrow, please. I called but they were already closed.And I am out of town until next week. Thanks. FW

## 2012-07-14 NOTE — Telephone Encounter (Signed)
Pharmacy called stating they need more detailed directions for humalog and sliding scale. Please advise and contact [facility] with details. Thank you

## 2012-07-15 NOTE — Telephone Encounter (Signed)
I reviewed patient's paper chart and electronic records.  I have not been able to locate any documentation as to how she has been instructed to use her sliding scale Humalog.  I tried to call patient but received no answer.

## 2012-07-16 MED ORDER — GLUCOSE BLOOD VI STRP: ORAL_STRIP | Status: DC

## 2012-07-16 NOTE — Telephone Encounter (Signed)
Spoke with patient and she is not sure how she is suppose to use Humalog.   Her A1c was 6.7% at visit 06/2012.  She has not taken Humalog in several years.  BG this am was 93, 102 - no BG readings over 150.    She also need new rx for test strips that indicate that she was instructed to check BG twice a day.  I instructed patient to not take Humalog and will forward this message back to Dr. Modesto Charon I will also send rx to [facility] for test strips

## 2012-07-30 NOTE — Telephone Encounter (Signed)
Opened in error

## 2012-08-04 ENCOUNTER — Ambulatory Visit (INDEPENDENT_AMBULATORY_CARE_PROVIDER_SITE_OTHER): Payer: Medicare Other | Admitting: Pharmacist Clinician (PhC)/ Clinical Pharmacy Specialist

## 2012-08-04 DIAGNOSIS — E119 Type 2 diabetes mellitus without complications: Secondary | ICD-10-CM

## 2012-08-04 NOTE — Progress Notes (Signed)
  Subjective:    Patient ID: Tonya Carpenter, female    DOB: 1945/09/27, 67 y.o.   MRN: [id number]  HPI  DIABETES FOLLOW UP VISIT.  Long standing type 2 DM    Review of Systems  Constitutional: Positive for fatigue.  Cardiovascular: Negative.   Endocrine: Positive for polyuria.  Neurological: Negative.   Psychiatric/Behavioral: Negative.        Objective:   Physical Exam  Constitutional: She is oriented to person, place, and time. She appears well-developed and well-nourished.  Cardiovascular: Normal rate, regular rhythm, normal heart sounds and intact distal pulses.  Exam reveals no gallop and no friction rub.   No murmur heard. Neurological: She is alert and oriented to person, place, and time. She has normal reflexes.  Skin: Skin is warm and dry.  Psychiatric: She has a normal mood and affect. Her behavior is normal. Judgment and thought content normal.          Assessment & Plan:   Diabetes Follow-Up Visit Chief Complaint:  No chief complaint on file.    Exam Regularity:  RRR Edema:  neg  Polyuria:  pos  Polydipsia:  neg Polyphagia:  neg  BMI:  There is no weight on file to calculate BMI.   Weight changes:  Weight gain General Appearance:  alert, oriented, no acute distress Mood/Affect:  normal  HPI:  Type 2 DM for many years   Low fat/carbohydrate diet?  No Nicotine Abuse?  No Medication Compliance?  Yes Exercise?  Yes Alcohol Abuse?  No  Home BG Monitoring:  Checking 1 times a day. Average:  110  High: 172  Low:  81   Lab Results  Component Value Date   HGBA1C 6.7% 07/10/2012    No results found for this basename: Concepcion Elk    Lab Results  Component Value Date   LDLCALC 80 07/10/2012   TRIG 85 07/10/2012      Assessment: 1.  Diabetes.  Excellent control  2.  Blood Pressure.  Some slight systolic elevation 162/72 3.  Lipids.  Pending recheck 4.  Foot Care.  daily 5.  Dental Care.  Recommended bi-annual 6.  Eye Care/Exam.  Recommended  annual  Recommendations: 1.  Patient is counseled on appropriate foot care. 2.  BP goal < 130/80. 3.  LDL goal of < 100, HDL > 40 and TG < 150. 4.  Eye Exam yearly and Dental Exam every 6 months. 5.  Dietary recommendations:  1800 cal ADA diet 6.  Physical Activity recommendations:  30 minutes a day walking 7.  Medication recommendations at this time are as follows:  Decrease lantus to 40 units a day and patient never started humalog, instructed not to start at this time.   8.  Return to clinic in 4-6 wks 9.  Plan to wean patient off lantus and start metformin if serum creatine normal.  Stopped oral meds in the past due to cost concerns, not lack of efficacy of side effects.  Patient has been drinking lots of [employer] and eating high CHO diet, she is motivated to make changes and thus will need to adjust Lantus dose downwards.  She is to call in 1 week with BG reading and dose adjustment.   Time spent counseling patient:  91  Physician time spent with patient:  0 Referring provider:  Modesto Charon   PharmD:  [facility] Pharmacist

## 2012-08-12 ENCOUNTER — Telehealth: Payer: Self-pay | Admitting: Pharmacist

## 2012-08-12 NOTE — Telephone Encounter (Signed)
Patient actually had questions about Lantus and Humalog.   She wanted to speak with Chari Manning and is OK with waiting until she returns on Tuesday, June 3rd.   I reviewed last office visit with Marcelino Duster and instructed based on that note she is to inject 40 units of lantus daily and not to use Humalog at all.   Will route message to Chari Manning to call patient next week

## 2012-08-18 ENCOUNTER — Telehealth: Payer: Self-pay | Admitting: Family Medicine

## 2012-08-18 NOTE — Telephone Encounter (Signed)
Called patient back regarding blood glucose readings.  Her home BG are running 90-105mg /dL.  She is doing an excellent job with dietary changes and is highly motivated.  I instructed patient to decrease Lantus to 35 units and if BG remain less than 120 she can cut back in several more days to 30 units.  She will call me in 1 week with her home readings.  Will still plan on starting Metformin if serum creatine is normal in the next 1-2 weeks.

## 2012-08-18 NOTE — Telephone Encounter (Signed)
Called patient on 6/3 at 10am, no answer or answering machine picked up.  Will continue to try and reach patient today.

## 2012-08-25 ENCOUNTER — Telehealth: Payer: Self-pay | Admitting: Pharmacist Clinician (PhC)/ Clinical Pharmacy Specialist

## 2012-08-25 NOTE — Telephone Encounter (Signed)
Called Tonya Carpenter back she left Home BP readings for me:  84, 95, 105, 112, 83, 108, 102.  Will plan on continuing to decrease her Lantus dose and start metformin 500mg  bid.  Patient's phone just kept ringing and she did not have voicemail set up.

## 2012-09-08 ENCOUNTER — Telehealth: Payer: Self-pay | Admitting: Family Medicine

## 2012-09-08 NOTE — Telephone Encounter (Signed)
Marcelino Duster this is for you- please address. bo

## 2012-09-08 NOTE — Telephone Encounter (Signed)
Called Tonya Carpenter and she is taking 20-30 units of Lantus a day with blood sugar readings averaging between 92-116 mg/dL.  I instructed her to pick up metformin 500mg  bid from the pharmacy to start tomorrow and to cut Lantus down to 10 units a day with the plan to still stop lantus altogether based on her response to metformin.  Instructed patient to come in 4 weeks for serum creatinine check.  She will call me next week with home BG readings.

## 2012-09-24 ENCOUNTER — Ambulatory Visit (INDEPENDENT_AMBULATORY_CARE_PROVIDER_SITE_OTHER): Payer: Medicare Other | Admitting: Family Medicine

## 2012-09-24 ENCOUNTER — Encounter: Payer: Self-pay | Admitting: Family Medicine

## 2012-09-24 VITALS — BP 132/70 | HR 64 | Temp 97.0°F | Wt 173.2 lb

## 2012-09-24 DIAGNOSIS — I10 Essential (primary) hypertension: Secondary | ICD-10-CM

## 2012-09-24 DIAGNOSIS — E785 Hyperlipidemia, unspecified: Secondary | ICD-10-CM

## 2012-09-24 DIAGNOSIS — I2510 Atherosclerotic heart disease of native coronary artery without angina pectoris: Secondary | ICD-10-CM

## 2012-09-24 DIAGNOSIS — R011 Cardiac murmur, unspecified: Secondary | ICD-10-CM

## 2012-09-24 DIAGNOSIS — E119 Type 2 diabetes mellitus without complications: Secondary | ICD-10-CM

## 2012-09-24 MED ORDER — HYDRALAZINE HCL 10 MG PO TABS: 10.0000 mg | ORAL_TABLET | Freq: Every day | ORAL | Status: DC

## 2012-09-24 MED ORDER — ATORVASTATIN CALCIUM 20 MG PO TABS: 20.0000 mg | ORAL_TABLET | Freq: Every day | ORAL | Status: DC

## 2012-09-24 MED ORDER — NITROGLYCERIN 0.4 MG SL SUBL: 0.4000 mg | SUBLINGUAL_TABLET | SUBLINGUAL | Status: DC | PRN

## 2012-09-24 MED ORDER — GLIMEPIRIDE 2 MG PO TABS: 2.0000 mg | ORAL_TABLET | Freq: Every day | ORAL | Status: DC

## 2012-09-24 MED ORDER — QUINAPRIL-HYDROCHLOROTHIAZIDE 20-12.5 MG PO TABS: 1.0000 | ORAL_TABLET | Freq: Every day | ORAL | Status: DC

## 2012-09-24 MED ORDER — CLOPIDOGREL BISULFATE 75 MG PO TABS: 75.0000 mg | ORAL_TABLET | Freq: Every day | ORAL | Status: DC

## 2012-09-24 MED ORDER — METOPROLOL SUCCINATE ER 100 MG PO TB24: 100.0000 mg | ORAL_TABLET | Freq: Every day | ORAL | Status: DC

## 2012-09-24 NOTE — Progress Notes (Signed)
Patient ID: Tonya Carpenter, female   DOB: May 06, 1945, 67 y.o.   MRN: [id number] SUBJECTIVE: CC: Chief Complaint  Patient presents with  . Follow-up    2 month follow up needs refills and states metformin is causing to many loose stools    HPI: Patient is here for follow up of Diabetes Mellitus/hyperlipidemia/hypertension: Symptoms: Denies Nocturia ,Denies Urinary Frequency , denies Blurred vision ,deniesDizziness,denies.Dysuria,denies paresthesias, denies extremity pain or ulcers.Marland Kitchendenies chest pain. has had an annual eye exam. do check the feet. Does check CBGs. Average CBG:90s to 110s Denies episodes of hypoglycemia. Does have an emergency hypoglycemic plan. admits toCompliance with medications. Problems with medications. Has diarrhea with the present does of metformin. Was better with a lower dose.  Breakfast:1/2 sandwich with spam, Lunch: chicken pot pie supper turnip greens, green beans, corn bread   Past Medical History  Diagnosis Date  . Hypertension   . Diabetes mellitus   . Hyperlipidemia   . Chest pain    Past Surgical History  Procedure Laterality Date  . Total abdominal hysterectomy    . Tubal ligation    . Cholecystectomy     History   Social History  . Marital Status: Married    Spouse Name: N/A    Number of Children: N/A  . Years of Education: N/A   Occupational History  . Disability    Social History Main Topics  . Smoking status: Never Smoker   . Smokeless tobacco: Not on file  . Alcohol Use: No  . Drug Use: No  . Sexually Active: Yes    Birth Control/ Protection: Surgical   Other Topics Concern  . Not on file   Social History Narrative  . No narrative on file   Family History  Problem Relation Age of Onset  . Hypertension Mother    Current Outpatient Prescriptions on File Prior to Visit  Medication Sig Dispense Refill  . acetaminophen (TYLENOL) 500 MG tablet Take 1,000 mg by mouth every 6 (six) hours as needed. For pain      .  atorvastatin (LIPITOR) 20 MG tablet Take 1 tablet (20 mg total) by mouth daily.  30 tablet  5  . clopidogrel (PLAVIX) 75 MG tablet Take 1 tablet (75 mg total) by mouth daily.  30 tablet  5  . glucose blood (PRODIGY TEST) test strip Use to check BG twice a day  250.01 - insulin dep. DM  100 each  12  . hydrALAZINE (APRESOLINE) 10 MG tablet Take 1 tablet (10 mg total) by mouth daily.  30 tablet  5  . metoprolol succinate (TOPROL-XL) 100 MG 24 hr tablet Take 1 tablet (100 mg total) by mouth daily.  30 tablet  5  . nitroGLYCERIN (NITROSTAT) 0.4 MG SL tablet Place 0.4 mg under the tongue every 5 (five) minutes as needed. For chest pain      . quinapril-hydrochlorothiazide (ACCURETIC) 20-12.5 MG per tablet Take 1 tablet by mouth daily.  30 tablet  5  . Fiber POWD Take by mouth 2 (two) times daily.        . insulin glargine (LANTUS) 100 UNIT/ML injection Inject 0.45 mLs (45 Units total) into the skin every evening.  20 mL  11  . insulin lispro (HUMALOG) 100 UNIT/ML injection Use as directed  On sliding scale  20 mL  11   No current facility-administered medications on file prior to visit.   No Known Allergies  There is no immunization history on file for this patient. Prior to  Admission medications   Medication Sig Start Date End Date Taking? Authorizing Provider  acetaminophen (TYLENOL) 500 MG tablet Take 1,000 mg by mouth every 6 (six) hours as needed. For pain   Yes Historical Provider, MD  atorvastatin (LIPITOR) 20 MG tablet Take 1 tablet (20 mg total) by mouth daily. 07/10/12  Yes Ileana Ladd, MD  clopidogrel (PLAVIX) 75 MG tablet Take 1 tablet (75 mg total) by mouth daily. 07/10/12  Yes Ileana Ladd, MD  glucose blood (PRODIGY TEST) test strip Use to check BG twice a day  250.01 - insulin dep. DM 07/16/12  Yes Tammy Eckard, PHARMD  hydrALAZINE (APRESOLINE) 10 MG tablet Take 1 tablet (10 mg total) by mouth daily. 07/10/12  Yes Ileana Ladd, MD  metFORMIN (GLUCOPHAGE) 500 MG tablet Take 500  mg by mouth 2 (two) times daily with a meal.  09/08/12  Yes Historical Provider, MD  metoprolol succinate (TOPROL-XL) 100 MG 24 hr tablet Take 1 tablet (100 mg total) by mouth daily. 07/10/12  Yes Ileana Ladd, MD  nitroGLYCERIN (NITROSTAT) 0.4 MG SL tablet Place 0.4 mg under the tongue every 5 (five) minutes as needed. For chest pain   Yes Historical Provider, MD  quinapril-hydrochlorothiazide (ACCURETIC) 20-12.5 MG per tablet Take 1 tablet by mouth daily. 07/10/12  Yes Ileana Ladd, MD  Fiber POWD Take by mouth 2 (two) times daily.      Historical Provider, MD  insulin glargine (LANTUS) 100 UNIT/ML injection Inject 0.45 mLs (45 Units total) into the skin every evening. 07/10/12   Ileana Ladd, MD  insulin lispro (HUMALOG) 100 UNIT/ML injection Use as directed  On sliding scale 07/10/12   Ileana Ladd, MD     ROS: As above in the HPI. All other systems are stable or negative.  OBJECTIVE: APPEARANCE:  Patient in no acute distress.The patient appeared well nourished and normally developed. Acyanotic. Waist: VITAL SIGNS:BP 132/70  Pulse 64  Temp(Src) 97 F (36.1 C) (Oral)  Wt 173 lb 3.2 oz (78.563 kg)  BMI 32.74 kg/m2 AAF  SKIN: warm and  Dry without overt rashes, tattoos and scars  HEAD and Neck: without JVD, Head and scalp: normal Eyes:No scleral icterus. Fundi normal, eye movements normal. Ears: Auricle normal, canal normal, Tympanic membranes normal, insufflation normal. Nose: normal Throat: normal Neck & thyroid: normal  CHEST & LUNGS: Chest wall: normal Lungs: Clear  CVS: Reveals the PMI to be normally located. Regular rhythm, First and Second Heart sounds are normal,  2/6 systolic murmur, no rubs or gallops. Peripheral vasculature: Radial pulses: normal Dorsal pedis pulses: normal Posterior pulses: normal  ABDOMEN:  Appearance: obese Benign, no organomegaly, no masses, no Abdominal Aortic enlargement. No Guarding , no rebound. No Bruits. Bowel sounds:  normal  RECTAL: N/A GU: N/A  EXTREMETIES: nonedematous. Both Femoral and Pedal pulses are normal.  MUSCULOSKELETAL:  Spine: normal  NEUROLOGIC: oriented to time,place and person; nonfocal.   Results for orders placed in visit on 07/10/12  BASIC METABOLIC PANEL WITH GFR      Result Value Range   Sodium 141  135 - 145 mEq/L   Potassium 3.7  3.5 - 5.3 mEq/L   Chloride 102  96 - 112 mEq/L   CO2 30  19 - 32 mEq/L   Glucose, Bld 109 (*) 70 - 99 mg/dL   BUN 16  6 - 23 mg/dL   Creat 1.61 (*) 0.96 - 1.10 mg/dL   Calcium 9.6  8.4 - 04.5 mg/dL  GFR, Est African American 55 (*)    GFR, Est Non African American 48 (*)   HEPATIC FUNCTION PANEL      Result Value Range   Total Bilirubin 0.6  0.3 - 1.2 mg/dL   Bilirubin, Direct 0.1  0.0 - 0.3 mg/dL   Indirect Bilirubin 0.5  0.0 - 0.9 mg/dL   Alkaline Phosphatase 57  39 - 117 U/L   AST 20  0 - 37 U/L   ALT 19  0 - 35 U/L   Total Protein 7.4  6.0 - 8.3 g/dL   Albumin 4.1  3.5 - 5.2 g/dL  NMR LIPOPROFILE WITH LIPIDS      Result Value Range   LDL Particle Number [id number] (*) <1000 nmol/L   LDL (calc) 80  <100 mg/dL   HDL-C 51  >=16 mg/dL   Triglycerides 85  <109 mg/dL   Cholesterol, Total 604  <200 mg/dL   HDL Particle Number 54.0  >=98.1 umol/L   Large HDL-P 6.3  >=4.8 umol/L   Large VLDL-P 1.1  <=2.7 nmol/L   Small LDL Particle Number 498  <=527 nmol/L   LDL Size 21.0  >20.5 nm   HDL Size 8.8 (*) >=9.2 nm   VLDL Size 42.8  <=46.6 nm   LP-IR Score 40  <=45  TSH      Result Value Range   TSH 1.476  0.350 - 4.500 uIU/mL  POCT GLYCOSYLATED HEMOGLOBIN (HGB A1C)      Result Value Range   Hemoglobin A1C 6.7%    POCT UA - MICROALBUMIN      Result Value Range   Microalbumin Ur, POC 20 mg/l      ASSESSMENT:  HYPERTENSION, UNSPECIFIED - Plan: quinapril-hydrochlorothiazide (ACCURETIC) 20-12.5 MG per tablet, hydrALAZINE (APRESOLINE) 10 MG tablet, metoprolol succinate (TOPROL-XL) 100 MG 24 hr tablet  DIABETES MELLITUS - Plan: metFORMIN  (GLUCOPHAGE) 500 MG tablet, glimepiride (AMARYL) 2 MG tablet  CARDIAC MURMUR  CAD, multiple vessel - Plan: atorvastatin (LIPITOR) 20 MG tablet, clopidogrel (PLAVIX) 75 MG tablet, metoprolol succinate (TOPROL-XL) 100 MG 24 hr tablet, nitroGLYCERIN (NITROSTAT) 0.4 MG SL tablet  HYPERLIPIDEMIA-MIXED - Plan: atorvastatin (LIPITOR) 20 MG tablet   PLAN: No orders of the defined types were placed in this encounter.        Dr Woodroe Mode Recommendations  Diet and Exercise discussed with patient.  For nutrition information, I recommend books:  1).Eat to Live by Dr Monico Hoar. 2).Prevent and Reverse Heart Disease by Dr Suzzette Righter. 3) Dr Katherina Right Book:  Program to Reverse Diabetes  Exercise recommendations are:  If unable to walk, then the patient can exercise in a chair 3 times a day. By flapping arms like a bird gently and raising legs outwards to the front.  If ambulatory, the patient can go for walks for 30 minutes 3 times a week. Then increase the intensity and duration as tolerated.  Goal is to try to attain exercise frequency to 5 times a week.  If applicable: Best to perform resistance exercises (machines or weights) 2 days a week and cardio type exercises 3 days per week.  counselled on a plant based  Diet   Meds ordered this encounter  Medications  . DISCONTD: metFORMIN (GLUCOPHAGE) 500 MG tablet    Sig: Take 500 mg by mouth 2 (two) times daily with a meal.   . metFORMIN (GLUCOPHAGE) 500 MG tablet    Sig: Take 0.5 tablets (250 mg total) by mouth 2 (two) times daily with a  meal.  . glimepiride (AMARYL) 2 MG tablet    Sig: Take 1 tablet (2 mg total) by mouth daily before breakfast.    Dispense:  30 tablet    Refill:  6  . quinapril-hydrochlorothiazide (ACCURETIC) 20-12.5 MG per tablet    Sig: Take 1 tablet by mouth daily.    Dispense:  30 tablet    Refill:  5  . atorvastatin (LIPITOR) 20 MG tablet    Sig: Take 1 tablet (20 mg total) by mouth daily.     Dispense:  30 tablet    Refill:  5  . clopidogrel (PLAVIX) 75 MG tablet    Sig: Take 1 tablet (75 mg total) by mouth daily.    Dispense:  30 tablet    Refill:  5  . hydrALAZINE (APRESOLINE) 10 MG tablet    Sig: Take 1 tablet (10 mg total) by mouth daily.    Dispense:  30 tablet    Refill:  5  . metoprolol succinate (TOPROL-XL) 100 MG 24 hr tablet    Sig: Take 1 tablet (100 mg total) by mouth daily.    Dispense:  30 tablet    Refill:  5  . nitroGLYCERIN (NITROSTAT) 0.4 MG SL tablet    Sig: Place 1 tablet (0.4 mg total) under the tongue every 5 (five) minutes as needed. For chest pain    Dispense:  25 tablet    Refill:  1  reviewed labs with patient   Return in about 4 weeks (around 10/22/2012) for Recheck medical problems.  Blood pressure and  Sugars with meds adjustment.  Akeya Ryther P. [place] Charon, M.D.

## 2012-09-24 NOTE — Patient Instructions (Addendum)
      Dr Adalyn Pennock's Recommendations  Diet and Exercise discussed with patient.  For nutrition information, I recommend books:  1).Eat to Live by Dr Joel Fuhrman. 2).Prevent and Reverse Heart Disease by Dr Caldwell Esselstyn. 3) Dr Neal Barnard's Book:  Program to Reverse Diabetes  Exercise recommendations are:  If unable to walk, then the patient can exercise in a chair 3 times a day. By flapping arms like a bird gently and raising legs outwards to the front.  If ambulatory, the patient can go for walks for 30 minutes 3 times a week. Then increase the intensity and duration as tolerated.  Goal is to try to attain exercise frequency to 5 times a week.  If applicable: Best to perform resistance exercises (machines or weights) 2 days a week and cardio type exercises 3 days per week.  

## 2012-10-23 ENCOUNTER — Ambulatory Visit: Payer: Medicare Other | Admitting: Family Medicine

## 2012-10-26 ENCOUNTER — Ambulatory Visit (INDEPENDENT_AMBULATORY_CARE_PROVIDER_SITE_OTHER): Payer: Medicare Other | Admitting: Family Medicine

## 2012-10-26 ENCOUNTER — Encounter: Payer: Self-pay | Admitting: Family Medicine

## 2012-10-26 VITALS — BP 138/69 | HR 68 | Temp 97.6°F | Wt 168.9 lb

## 2012-10-26 DIAGNOSIS — R011 Cardiac murmur, unspecified: Secondary | ICD-10-CM

## 2012-10-26 DIAGNOSIS — E119 Type 2 diabetes mellitus without complications: Secondary | ICD-10-CM

## 2012-10-26 DIAGNOSIS — I2510 Atherosclerotic heart disease of native coronary artery without angina pectoris: Secondary | ICD-10-CM

## 2012-10-26 DIAGNOSIS — I10 Essential (primary) hypertension: Secondary | ICD-10-CM

## 2012-10-26 DIAGNOSIS — E785 Hyperlipidemia, unspecified: Secondary | ICD-10-CM

## 2012-10-26 LAB — POCT GLYCOSYLATED HEMOGLOBIN (HGB A1C): Hemoglobin A1C: 6.4

## 2012-10-26 LAB — POCT UA - MICROALBUMIN: Microalbumin Ur, POC: NEGATIVE mg/L

## 2012-10-26 NOTE — Progress Notes (Signed)
Patient ID: Tonya Carpenter, female   DOB: [birth year], 67 y.o.   MRN: [id number] SUBJECTIVE: CC: Chief Complaint  Patient presents with  . Follow-up    diabetes     HPI: Patient is here for follow up of Diabetes Mellitus/HLD/HTN/CAD/Ht murmur: Symptoms evaluated: Denies Nocturia ,Denies Urinary Frequency , denies Blurred vision ,deniesDizziness,denies.Dysuria,denies paresthesias, denies extremity pain or ulcers.Marland Kitchendenies chest pain. has had an annual eye exam. do check the feet. Does check CBGs. Average CBG:88-144 Denies episodes of hypoglycemia. Does have an emergency hypoglycemic plan. admits toCompliance with medications. Problems with medications: diarrhea/loose stool.  Past Medical History  Diagnosis Date  . Hypertension   . Diabetes mellitus   . Hyperlipidemia   . Chest pain    Past Surgical History  Procedure Laterality Date  . Total abdominal hysterectomy    . Tubal ligation    . Cholecystectomy     History   Social History  . Marital Status: Married    Spouse Name: N/A    Number of Children: N/A  . Years of Education: N/A   Occupational History  . Disability    Social History Main Topics  . Smoking status: Never Smoker   . Smokeless tobacco: Not on file  . Alcohol Use: No  . Drug Use: No  . Sexually Active: Yes    Birth Control/ Protection: Surgical   Other Topics Concern  . Not on file   Social History Narrative  . No narrative on file   Family History  Problem Relation Age of Onset  . Hypertension Mother    Current Outpatient Prescriptions on File Prior to Visit  Medication Sig Dispense Refill  . acetaminophen (TYLENOL) 500 MG tablet Take 1,000 mg by mouth every 6 (six) hours as needed. For pain      . atorvastatin (LIPITOR) 20 MG tablet Take 1 tablet (20 mg total) by mouth daily.  30 tablet  5  . clopidogrel (PLAVIX) 75 MG tablet Take 1 tablet (75 mg total) by mouth daily.  30 tablet  5  . glimepiride (AMARYL) 2 MG tablet Take 1 tablet (2 mg  total) by mouth daily before breakfast.  30 tablet  6  . glucose blood (PRODIGY TEST) test strip Use to check BG twice a day  250.01 - insulin dep. DM  100 each  12  . hydrALAZINE (APRESOLINE) 10 MG tablet Take 1 tablet (10 mg total) by mouth daily.  30 tablet  5  . metFORMIN (GLUCOPHAGE) 500 MG tablet Take 0.5 tablets (250 mg total) by mouth 2 (two) times daily with a meal.      . metoprolol succinate (TOPROL-XL) 100 MG 24 hr tablet Take 1 tablet (100 mg total) by mouth daily.  30 tablet  5  . nitroGLYCERIN (NITROSTAT) 0.4 MG SL tablet Place 1 tablet (0.4 mg total) under the tongue every 5 (five) minutes as needed. For chest pain  25 tablet  1  . quinapril-hydrochlorothiazide (ACCURETIC) 20-12.5 MG per tablet Take 1 tablet by mouth daily.  30 tablet  5   No current facility-administered medications on file prior to visit.   No Known Allergies  There is no immunization history on file for this patient. Prior to Admission medications   Medication Sig Start Date End Date Taking? Authorizing Provider  acetaminophen (TYLENOL) 500 MG tablet Take 1,000 mg by mouth every 6 (six) hours as needed. For pain   Yes Historical Provider, MD  atorvastatin (LIPITOR) 20 MG tablet Take 1 tablet (20 mg total)  by mouth daily. 09/24/12  Yes Ileana Ladd, MD  clopidogrel (PLAVIX) 75 MG tablet Take 1 tablet (75 mg total) by mouth daily. 09/24/12  Yes Ileana Ladd, MD  glimepiride (AMARYL) 2 MG tablet Take 1 tablet (2 mg total) by mouth daily before breakfast. 09/24/12  Yes Ileana Ladd, MD  glucose blood (PRODIGY TEST) test strip Use to check BG twice a day  250.01 - insulin dep. DM 07/16/12  Yes Tammy Eckard, PHARMD  hydrALAZINE (APRESOLINE) 10 MG tablet Take 1 tablet (10 mg total) by mouth daily. 09/24/12  Yes Ileana Ladd, MD  metFORMIN (GLUCOPHAGE) 500 MG tablet Take 0.5 tablets (250 mg total) by mouth 2 (two) times daily with a meal. 09/24/12  Yes Ileana Ladd, MD  metoprolol succinate (TOPROL-XL) 100 MG  24 hr tablet Take 1 tablet (100 mg total) by mouth daily. 09/24/12  Yes Ileana Ladd, MD  nitroGLYCERIN (NITROSTAT) 0.4 MG SL tablet Place 1 tablet (0.4 mg total) under the tongue every 5 (five) minutes as needed. For chest pain 09/24/12  Yes Ileana Ladd, MD  quinapril-hydrochlorothiazide (ACCURETIC) 20-12.5 MG per tablet Take 1 tablet by mouth daily. 09/24/12  Yes Ileana Ladd, MD     ROS: As above in the HPI. All other systems are stable or negative.  OBJECTIVE: APPEARANCE:  Patient in no acute distress.The patient appeared well nourished and normally developed. Acyanotic. Waist: VITAL SIGNS:BP 138/69  Pulse 68  Temp(Src) 97.6 F (36.4 C) (Oral)  Wt 168 lb 14.4 oz (76.613 kg)  BMI 31.93 kg/m2  AAF SKIN: warm and  Dry without overt rashes, tattoos and scars  HEAD and Neck: without JVD, Head and scalp: normal Eyes:No scleral icterus. Fundi normal, eye movements normal. Ears: Auricle normal, canal normal, Tympanic membranes normal, insufflation normal. Nose: normal Throat: normal Neck & thyroid: normal  CHEST & LUNGS: Chest wall: normal Lungs: Clear  CVS: Reveals the PMI to be normally located. Regular rhythm, First and Second Heart sounds are normal,  2/6 soft short systolic murmur,no rubs nor gallops. Peripheral vasculature: Radial pulses: normal Dorsal pedis pulses:reduced Posterior pulses: reduced ABDOMEN:  Appearance: normal Benign, no organomegaly, no masses, no Abdominal Aortic enlargement. No Guarding , no rebound. No Bruits. Bowel sounds: normal  RECTAL: N/A GU: N/A  EXTREMETIES: nonedematous. Both Femoral and Pedal pulses are normal.  MUSCULOSKELETAL:  Spine: normal Joints: intact  DM foot exam performed.  NEUROLOGIC: oriented to time,place and person; nonfocal. Strength is normal Sensory is normal Reflexes are normal Cranial Nerves are normal.   Results for orders placed in visit on 07/10/12  BASIC METABOLIC PANEL WITH GFR       Result Value Range   Sodium 141  135 - 145 mEq/L   Potassium 3.7  3.5 - 5.3 mEq/L   Chloride 102  96 - 112 mEq/L   CO2 30  19 - 32 mEq/L   Glucose, Bld 109 (*) 70 - 99 mg/dL   BUN 16  6 - 23 mg/dL   Creat 0.98 (*) 1.19 - 1.10 mg/dL   Calcium 9.6  8.4 - 14.7 mg/dL   GFR, Est African American 55 (*)    GFR, Est Non African American 48 (*)   HEPATIC FUNCTION PANEL      Result Value Range   Total Bilirubin 0.6  0.3 - 1.2 mg/dL   Bilirubin, Direct 0.1  0.0 - 0.3 mg/dL   Indirect Bilirubin 0.5  0.0 - 0.9 mg/dL   Alkaline Phosphatase  57  39 - 117 U/L   AST 20  0 - 37 U/L   ALT 19  0 - 35 U/L   Total Protein 7.4  6.0 - 8.3 g/dL   Albumin 4.1  3.5 - 5.2 g/dL  NMR LIPOPROFILE WITH LIPIDS      Result Value Range   LDL Particle Number [id number] (*) <1000 nmol/L   LDL (calc) 80  <100 mg/dL   HDL-C 51  >=16 mg/dL   Triglycerides 85  <109 mg/dL   Cholesterol, Total 604  <200 mg/dL   HDL Particle Number 54.0  >=98.1 umol/L   Large HDL-P 6.3  >=4.8 umol/L   Large VLDL-P 1.1  <=2.7 nmol/L   Small LDL Particle Number 498  <=527 nmol/L   LDL Size 21.0  >20.5 nm   HDL Size 8.8 (*) >=9.2 nm   VLDL Size 42.8  <=46.6 nm   LP-IR Score 40  <=45  TSH      Result Value Range   TSH 1.476  0.350 - 4.500 uIU/mL  POCT GLYCOSYLATED HEMOGLOBIN (HGB A1C)      Result Value Range   Hemoglobin A1C 6.7%    POCT UA - MICROALBUMIN      Result Value Range   Microalbumin Ur, POC 20 mg/l      ASSESSMENT: CAD, multiple vessel - Plan: CMP14+EGFR  CARDIAC MURMUR  DIABETES MELLITUS - Plan: POCT glycosylated hemoglobin (Hb A1C), POCT UA - Microalbumin  HYPERLIPIDEMIA-MIXED - Plan: Lipid panel, CMP14+EGFR  HYPERTENSION, UNSPECIFIED - Plan: CMP14+EGFR  PLAN:  Orders Placed This Encounter  Procedures  . Lipid panel  . CMP14+EGFR  . POCT glycosylated hemoglobin (Hb A1C)  . POCT UA - Microalbumin    Same medications and  Regimen.  Gave her books from [employer] on cooking for Diabetes and logs to record her  BS.  Return in about 3 months (around 01/26/2013) for Recheck medical problems.  Cobey Raineri P. [place] Charon, M.D.

## 2012-10-26 NOTE — Patient Instructions (Signed)

## 2012-10-27 LAB — LIPID PANEL
Chol/HDL Ratio: 2.3 ratio units (ref 0.0–4.4)
Cholesterol, Total: 123 mg/dL (ref 100–199)
HDL: 53 mg/dL (ref >39)
LDL Calculated: 50 mg/dL (ref 0–99)
Triglycerides: 101 mg/dL (ref 0–149)
VLDL Cholesterol Cal: 20 mg/dL (ref 5–40)

## 2012-10-27 LAB — CMP14+EGFR
ALT: 14 IU/L (ref 0–32)
AST: 16 IU/L (ref 0–40)
Albumin/Globulin Ratio: 1.6 (ref 1.1–2.5)
Albumin: 4.3 g/dL (ref 3.6–4.8)
Alkaline Phosphatase: 55 IU/L (ref 39–117)
BUN/Creatinine Ratio: 12 (ref 11–26)
BUN: 14 mg/dL (ref 8–27)
CO2: 30 mmol/L — ABNORMAL HIGH (ref 18–29)
Calcium: 9.9 mg/dL (ref 8.6–10.2)
Chloride: 100 mmol/L (ref 97–108)
Creatinine, Ser: 1.14 mg/dL — ABNORMAL HIGH (ref 0.57–1.00)
GFR calc Af Amer: 58 mL/min/{1.73_m2} — ABNORMAL LOW (ref >59)
GFR calc non Af Amer: 51 mL/min/{1.73_m2} — ABNORMAL LOW (ref >59)
Globulin, Total: 2.7 g/dL (ref 1.5–4.5)
Glucose: 131 mg/dL — ABNORMAL HIGH (ref 65–99)
Potassium: 4.3 mmol/L (ref 3.5–5.2)
Sodium: 139 mmol/L (ref 134–144)
Total Bilirubin: 0.5 mg/dL (ref 0.0–1.2)
Total Protein: 7 g/dL (ref 6.0–8.5)

## 2012-10-28 NOTE — Progress Notes (Signed)
Quick Note:  Lab result at goal. No change in Medications for now. No Change in plans and follow up.  Please have lab correct the results. They mixed up the A1C and the microalbumin test results. Thanks!!! ______

## 2012-12-01 ENCOUNTER — Encounter: Payer: Self-pay | Admitting: *Deleted

## 2012-12-18 ENCOUNTER — Ambulatory Visit (INDEPENDENT_AMBULATORY_CARE_PROVIDER_SITE_OTHER): Payer: Medicare Other | Admitting: *Deleted

## 2012-12-18 DIAGNOSIS — Z23 Encounter for immunization: Secondary | ICD-10-CM

## 2013-01-14 ENCOUNTER — Encounter (HOSPITAL_COMMUNITY): Payer: Self-pay | Admitting: Physician Assistant

## 2013-01-14 ENCOUNTER — Ambulatory Visit (HOSPITAL_COMMUNITY)
Admission: EM | Admit: 2013-01-14 | Discharge: 2013-01-15 | Disposition: A | Discharge status: Home / Self Care | Payer: Medicare Other | Source: Ambulatory Visit | Attending: Cardiovascular Disease | Admitting: Cardiovascular Disease

## 2013-01-14 ENCOUNTER — Encounter (HOSPITAL_COMMUNITY)
Admission: EM | Disposition: A | Discharge status: Home / Self Care | Payer: Self-pay | Source: Ambulatory Visit | Attending: Cardiovascular Disease

## 2013-01-14 ENCOUNTER — Ambulatory Visit (INDEPENDENT_AMBULATORY_CARE_PROVIDER_SITE_OTHER): Payer: Medicare Other | Admitting: Nurse Practitioner

## 2013-01-14 ENCOUNTER — Encounter: Payer: Self-pay | Admitting: Nurse Practitioner

## 2013-01-14 ENCOUNTER — Ambulatory Visit: Payer: Medicare Other | Admitting: Family Medicine

## 2013-01-14 VITALS — BP 142/67 | HR 68 | Temp 97.6°F

## 2013-01-14 DIAGNOSIS — E119 Type 2 diabetes mellitus without complications: Secondary | ICD-10-CM | POA: Insufficient documentation

## 2013-01-14 DIAGNOSIS — I2510 Atherosclerotic heart disease of native coronary artery without angina pectoris: Secondary | ICD-10-CM

## 2013-01-14 DIAGNOSIS — R079 Chest pain, unspecified: Secondary | ICD-10-CM | POA: Diagnosis present

## 2013-01-14 DIAGNOSIS — I10 Essential (primary) hypertension: Secondary | ICD-10-CM | POA: Insufficient documentation

## 2013-01-14 DIAGNOSIS — E669 Obesity, unspecified: Secondary | ICD-10-CM | POA: Diagnosis not present

## 2013-01-14 DIAGNOSIS — E785 Hyperlipidemia, unspecified: Secondary | ICD-10-CM | POA: Insufficient documentation

## 2013-01-14 DIAGNOSIS — R0789 Other chest pain: Secondary | ICD-10-CM | POA: Diagnosis not present

## 2013-01-14 DIAGNOSIS — I447 Left bundle-branch block, unspecified: Secondary | ICD-10-CM | POA: Insufficient documentation

## 2013-01-14 DIAGNOSIS — Z79899 Other long term (current) drug therapy: Secondary | ICD-10-CM | POA: Insufficient documentation

## 2013-01-14 DIAGNOSIS — R072 Precordial pain: Secondary | ICD-10-CM

## 2013-01-14 HISTORY — DX: Cerebral infarction, unspecified: I63.9

## 2013-01-14 HISTORY — DX: Anxiety disorder, unspecified: F41.9

## 2013-01-14 HISTORY — PX: LEFT HEART CATHETERIZATION WITH CORONARY ANGIOGRAM: SHX5451

## 2013-01-14 HISTORY — DX: Atherosclerotic heart disease of native coronary artery without angina pectoris: I25.10

## 2013-01-14 HISTORY — DX: Unspecified asthma, uncomplicated: J45.909

## 2013-01-14 HISTORY — DX: Type 2 diabetes mellitus without complications: E11.9

## 2013-01-14 HISTORY — DX: Shortness of breath: R06.02

## 2013-01-14 HISTORY — DX: Unspecified osteoarthritis, unspecified site: M19.90

## 2013-01-14 LAB — POCT I-STAT, CHEM 8
BUN: 10 mg/dL (ref 6–23)
Calcium, Ion: 1.25 mmol/L (ref 1.13–1.30)
Chloride: 103 mEq/L (ref 96–112)
Glucose, Bld: 205 mg/dL — ABNORMAL HIGH (ref 70–99)
HCT: 38 % (ref 36.0–46.0)
Sodium: 141 mEq/L (ref 135–145)

## 2013-01-14 LAB — GLUCOSE, CAPILLARY: Glucose-Capillary: 232 mg/dL — ABNORMAL HIGH (ref 70–99)

## 2013-01-14 SURGERY — LEFT HEART CATHETERIZATION WITH CORONARY ANGIOGRAM
Anesthesia: LOCAL

## 2013-01-14 MED ORDER — ACETAMINOPHEN 325 MG PO TABS: 650.0000 mg | ORAL_TABLET | ORAL | Status: DC | PRN

## 2013-01-14 MED ORDER — HEPARIN SODIUM (PORCINE) 1000 UNIT/ML IJ SOLN
INTRAMUSCULAR | Status: AC
  Filled 2013-01-14: qty 1

## 2013-01-14 MED ORDER — LIDOCAINE HCL (PF) 1 % IJ SOLN
INTRAMUSCULAR | Status: AC
  Filled 2013-01-14: qty 30

## 2013-01-14 MED ORDER — ASPIRIN 81 MG PO CHEW
81.0000 mg | CHEWABLE_TABLET | Freq: Once | ORAL | Status: AC
  Administered 2013-01-14: 81 mg via ORAL

## 2013-01-14 MED ORDER — MIDAZOLAM HCL 2 MG/2ML IJ SOLN
INTRAMUSCULAR | Status: AC
  Filled 2013-01-14: qty 2

## 2013-01-14 MED ORDER — GLIMEPIRIDE 2 MG PO TABS
2.0000 mg | ORAL_TABLET | Freq: Every day | ORAL | Status: DC
  Administered 2013-01-15: 2 mg via ORAL
  Filled 2013-01-14 (×2): qty 1

## 2013-01-14 MED ORDER — CLOPIDOGREL BISULFATE 75 MG PO TABS
75.0000 mg | ORAL_TABLET | Freq: Every day | ORAL | Status: DC
  Administered 2013-01-15: 75 mg via ORAL
  Filled 2013-01-14: qty 1

## 2013-01-14 MED ORDER — HYDROCHLOROTHIAZIDE 12.5 MG PO CAPS
12.5000 mg | ORAL_CAPSULE | Freq: Every day | ORAL | Status: DC
  Administered 2013-01-15: 12.5 mg via ORAL
  Filled 2013-01-14: qty 1

## 2013-01-14 MED ORDER — METOPROLOL SUCCINATE ER 100 MG PO TB24
100.0000 mg | ORAL_TABLET | Freq: Every day | ORAL | Status: DC
  Administered 2013-01-15: 100 mg via ORAL
  Filled 2013-01-14: qty 1

## 2013-01-14 MED ORDER — SODIUM CHLORIDE 0.9 % IV SOLN
Freq: Once | INTRAVENOUS | Status: AC
  Administered 2013-01-14: 13:00:00 via INTRAVENOUS

## 2013-01-14 MED ORDER — HYDROCHLOROTHIAZIDE 12.5 MG PO CAPS
12.5000 mg | ORAL_CAPSULE | Freq: Every day | ORAL | Status: DC
  Filled 2013-01-14: qty 1

## 2013-01-14 MED ORDER — HEPARIN (PORCINE) IN NACL 2-0.9 UNIT/ML-% IJ SOLN
INTRAMUSCULAR | Status: AC
  Filled 2013-01-14: qty 1000

## 2013-01-14 MED ORDER — QUINAPRIL-HYDROCHLOROTHIAZIDE 20-12.5 MG PO TABS: 1.0000 | ORAL_TABLET | Freq: Every day | ORAL | Status: DC

## 2013-01-14 MED ORDER — CLOPIDOGREL BISULFATE 75 MG PO TABS: 75.0000 mg | ORAL_TABLET | Freq: Every day | ORAL | Status: DC

## 2013-01-14 MED ORDER — SODIUM CHLORIDE 0.9 % IV SOLN
INTRAVENOUS | Status: AC
  Administered 2013-01-14: 15:00:00 via INTRAVENOUS

## 2013-01-14 MED ORDER — METOPROLOL SUCCINATE ER 100 MG PO TB24
100.0000 mg | ORAL_TABLET | Freq: Every day | ORAL | Status: DC
  Filled 2013-01-14: qty 1

## 2013-01-14 MED ORDER — FENTANYL CITRATE 0.05 MG/ML IJ SOLN
INTRAMUSCULAR | Status: AC
  Filled 2013-01-14: qty 2

## 2013-01-14 MED ORDER — NITROGLYCERIN 0.4 MG SL SUBL
0.4000 mg | SUBLINGUAL_TABLET | Freq: Once | SUBLINGUAL | Status: AC
  Administered 2013-01-14: 0.4 mg via SUBLINGUAL

## 2013-01-14 MED ORDER — HYDRALAZINE HCL 10 MG PO TABS
10.0000 mg | ORAL_TABLET | Freq: Every day | ORAL | Status: DC
  Administered 2013-01-15: 10 mg via ORAL
  Filled 2013-01-14: qty 1

## 2013-01-14 MED ORDER — ATORVASTATIN CALCIUM 20 MG PO TABS
20.0000 mg | ORAL_TABLET | Freq: Every day | ORAL | Status: DC
  Filled 2013-01-14: qty 1

## 2013-01-14 MED ORDER — LISINOPRIL 20 MG PO TABS
20.0000 mg | ORAL_TABLET | Freq: Every day | ORAL | Status: DC
  Filled 2013-01-14: qty 1

## 2013-01-14 MED ORDER — LISINOPRIL 20 MG PO TABS
20.0000 mg | ORAL_TABLET | Freq: Every day | ORAL | Status: DC
  Administered 2013-01-15: 20 mg via ORAL
  Filled 2013-01-14: qty 1

## 2013-01-14 MED ORDER — NITROGLYCERIN 0.2 MG/ML ON CALL CATH LAB
INTRAVENOUS | Status: AC
  Filled 2013-01-14: qty 1

## 2013-01-14 MED ORDER — VERAPAMIL HCL 2.5 MG/ML IV SOLN
INTRAVENOUS | Status: AC
  Filled 2013-01-14: qty 2

## 2013-01-14 MED ORDER — ATORVASTATIN CALCIUM 20 MG PO TABS
20.0000 mg | ORAL_TABLET | Freq: Every day | ORAL | Status: DC
  Administered 2013-01-15: 20 mg via ORAL
  Filled 2013-01-14: qty 1

## 2013-01-14 MED ORDER — ONDANSETRON HCL 4 MG/2ML IJ SOLN: 4.0000 mg | Freq: Four times a day (QID) | INTRAMUSCULAR | Status: DC | PRN

## 2013-01-14 MED ORDER — HYDRALAZINE HCL 10 MG PO TABS
10.0000 mg | ORAL_TABLET | Freq: Every day | ORAL | Status: DC
  Filled 2013-01-14: qty 1

## 2013-01-14 NOTE — H&P (Signed)
History and Physical   Patient ID: Tonya Carpenter MRN: [id number], DOB/AGE: December 09, 1945   Admit date: 01/14/2013 Date of Consult: 01/14/2013  Primary Physician: Redmond Baseman, MD Primary Cardiologist: previously seen by P. Eden Emms, MD  HPI: Tonya Carpenter is a 67 y.o. female w/ PMHx s/f DM2, HLD, HTN, obesity, chronic LBBB and anxiety who was transferred from her PCP's office to the [facility] cath lab today for ongoing chest pain and LBBB.   She has a history of prior cardiac catheterization 2008 in the setting of chest pain and uncontrolled hypertension revealed no angiographically significant CAD.   2D echo 06/2009: EF 50-55%, severe LVH, grade 1 diastolic dysfunction, mild MR, mild LA dilatation, small pericardial effusion.   She underwent [place]iscan Myoview 12/2009 revealing no evidence for stress-induced reversibility or ischemia.  She had a fixed anterior septal wall defect, possibly related to breast attenuation and her EF was 54%.  There is mention of multivessel CAD on prior PCP notes for which Plavix was prescribed. Not on a low-dose ASA.  She reports experiencing sudden onset, constant L sided chest pressure without radiation or associated symptoms occurning on Sunday. No particular change in quality with exertion. No precipatory chest pain leading up to Sunday. Severity reached a 5/10. Her discomfort persisted and she presented to her PCP's office. There, EKG revealed NSR, LBBB, LAD, HR 69 bpm. Given her ongoing symptoms and LBBB, concern was for STEMI and EMS was notified. She was administered full-dose ASA x 1. NTG with minimal relief. She arrived at the [facility] cath lab, alert, oriented, conversational and in no acute distress.   Problem List: Past Medical History  Diagnosis Date  . Hypertension   . Type 2 diabetes mellitus   . Hyperlipidemia   . Obesity     Past Surgical History  Procedure Laterality Date  . Total abdominal hysterectomy    . Tubal ligation    .  Cholecystectomy    . Tonsillectomy    . Appendectomy       Allergies: No Known Allergies  Home Medications: Prior to Admission medications   Medication Sig Start Date End Date Taking? Authorizing Provider  acetaminophen (TYLENOL) 500 MG tablet Take 1,000 mg by mouth every 6 (six) hours as needed. For pain    Historical Provider, MD  atorvastatin (LIPITOR) 20 MG tablet Take 1 tablet (20 mg total) by mouth daily. 09/24/12   Ileana Ladd, MD  clopidogrel (PLAVIX) 75 MG tablet Take 1 tablet (75 mg total) by mouth daily. 09/24/12   Ileana Ladd, MD  glimepiride (AMARYL) 2 MG tablet Take 1 tablet (2 mg total) by mouth daily before breakfast. 09/24/12   Ileana Ladd, MD  glucose blood (PRODIGY TEST) test strip Use to check BG twice a day  250.01 - insulin dep. DM 07/16/12   Tammy Eckard, PHARMD  hydrALAZINE (APRESOLINE) 10 MG tablet Take 1 tablet (10 mg total) by mouth daily. 09/24/12   Ileana Ladd, MD  metFORMIN (GLUCOPHAGE) 500 MG tablet Take 0.5 tablets (250 mg total) by mouth 2 (two) times daily with a meal. 09/24/12   Ileana Ladd, MD  metoprolol succinate (TOPROL-XL) 100 MG 24 hr tablet Take 1 tablet (100 mg total) by mouth daily. 09/24/12   Ileana Ladd, MD  nitroGLYCERIN (NITROSTAT) 0.4 MG SL tablet Place 1 tablet (0.4 mg total) under the tongue every 5 (five) minutes as needed. For chest pain 09/24/12   Ileana Ladd, MD  quinapril-hydrochlorothiazide (  ACCURETIC) 20-12.5 MG per tablet Take 1 tablet by mouth daily. 09/24/12   Ileana Ladd, MD    Inpatient Medications:   Prescriptions prior to admission  Medication Sig Dispense Refill  . acetaminophen (TYLENOL) 500 MG tablet Take 1,000 mg by mouth every 6 (six) hours as needed. For pain      . atorvastatin (LIPITOR) 20 MG tablet Take 1 tablet (20 mg total) by mouth daily.  30 tablet  5  . clopidogrel (PLAVIX) 75 MG tablet Take 1 tablet (75 mg total) by mouth daily.  30 tablet  5  . glimepiride (AMARYL) 2 MG tablet Take 1 tablet  (2 mg total) by mouth daily before breakfast.  30 tablet  6  . glucose blood (PRODIGY TEST) test strip Use to check BG twice a day  250.01 - insulin dep. DM  100 each  12  . hydrALAZINE (APRESOLINE) 10 MG tablet Take 1 tablet (10 mg total) by mouth daily.  30 tablet  5  . metFORMIN (GLUCOPHAGE) 500 MG tablet Take 0.5 tablets (250 mg total) by mouth 2 (two) times daily with a meal.      . metoprolol succinate (TOPROL-XL) 100 MG 24 hr tablet Take 1 tablet (100 mg total) by mouth daily.  30 tablet  5  . nitroGLYCERIN (NITROSTAT) 0.4 MG SL tablet Place 1 tablet (0.4 mg total) under the tongue every 5 (five) minutes as needed. For chest pain  25 tablet  1  . quinapril-hydrochlorothiazide (ACCURETIC) 20-12.5 MG per tablet Take 1 tablet by mouth daily.  30 tablet  5    Family History  Problem Relation Age of Onset  . Hypertension Mother      History   Social History  . Marital Status: Married    Spouse Name: N/A    Number of Children: 2  . Years of Education: N/A   Occupational History  . Disability    Social History Main Topics  . Smoking status: Never Smoker   . Smokeless tobacco: Not on file  . Alcohol Use: No  . Drug Use: No  . Sexual Activity: Yes    Birth Control/ Protection: Surgical   Other Topics Concern  . Not on file   Social History Narrative   Lives in [place], [place].      Review of Systems:  Denies associated shortness of breath, diaphoresis, lightheadedness or nausea. Further ROS limited by the acuity of the situation.   All other systems reviewed and are otherwise negative except as noted above.  Physical Exam:   General: Well developed, obese appearing, African American female in no acute distress. Head: Normocephalic, atraumatic, sclera non-icteric, no xanthomas, nares are without discharge.  Neck: Negative for carotid bruits. JVD not elevated. Lungs: Clear bilaterally to auscultation without wheezes, rales, or rhonchi. Breathing is unlabored. Heart: RRR  with S1 S2. No murmurs, rubs, or gallops appreciated. Abdomen: Soft, non-tender, non-distended with normoactive bowel sounds. No hepatomegaly. No rebound/guarding. No obvious abdominal masses. Msk:  Strength and tone appears normal for age. Extremities: No clubbing, cyanosis or edema.  Distal pedal pulses are 2+ and equal bilaterally. Neuro: Alert and oriented X 3. Moves all extremities spontaneously. Psych:  Responds to questions appropriately with a normal affect.  Labs:  Pending  Radiology/Studies: No results found.  EKG: NSR, LBBB (old), 69 bpm, LAD  ASSESSMENT AND PLAN:   1. Precordial pain, chronic LBBB 2. Type 2 DM 3. Hyperlipidemia 4. Hypertension 5. Obesity  The patient presents somewhat atypically describing L  sided/precorial pressure reaching a 5/10 since Sunday. No associated diaphoresis, nausea or shortness of breath. Minimal response to NTG. Note at PCP's office mentioned the pain is aggravated by coughing, deep breathing, moving and walking. She presented to her PCP's office where EKG revealed LBBB. She does have prior ischemic evaluations in the form of cath in 2008 and [place] Myoview 2011 revealing no evidence of flow-limiting CAD. Given ongoing discomfort and high pretest probability (diabetic, HTN, HLD, obese), will plan to proceed with diagnostic cardiac catheterization for further evaluation. Continue outpatient Plavix, ACEi, BB, statin, NTG SL. Consider adding ASA if PCI performed. Unclear why not taking. She does have a history of rectal bleeding in 2011. Sigmoidoscopy and ileal colonoscopy showed no abnormalities. Further recommendations per interventionalist's findings.   Signed, R. Hurman Horn, PA-C 01/14/2013, 1:54 PM   I have personally seen and examined this patient with Hurman Horn, PA-C. I agree with the assessment and plan as outlined above. Diabetic, hypertensive patient with ongoing chest pain, ST elevation anterior leads with depression laterally  c/w LVH strain pattern. Given ongoing chest pain, will plan urgent cardiac cath. This is not a STEMI.   MCALHANY,CHRISTOPHER 2:26 PM 01/14/2013

## 2013-01-14 NOTE — Interval H&P Note (Signed)
History and Physical Interval Note:  01/14/2013 2:26 PM  Garry Heater Bonnes  has presented today for cardiac cath with ongoing CP, risk factors for CAD.  The various methods of treatment have been discussed with the patient and family. After consideration of risks, benefits and other options for treatment, the patient has consented to  Procedure(s): LEFT HEART CATHETERIZATION WITH CORONARY ANGIOGRAM (N/A) as a surgical intervention .  The patient's history has been reviewed, patient examined, no change in status, stable for surgery.  I have reviewed the patient's chart and labs.  Questions were answered to the patient's satisfaction.    Cath Lab Visit (complete for each Cath Lab visit)  Clinical Evaluation Leading to the Procedure:   ACS: no  Non-ACS:    Anginal Classification: CCS IV  Anti-ischemic medical therapy: No Therapy  Non-Invasive Test Results: No non-invasive testing performed  Prior CABG: No previous CABG         Shoua Ressler

## 2013-01-14 NOTE — CV Procedure (Signed)
      Cardiac Catheterization Operative Report  Tonya Carpenter [id number] 10/30/20142:28 PM Redmond Baseman, MD  Procedure Performed:  1. Left Heart Catheterization 2. Selective Coronary Angiography 3. Left ventricular angiogram  Operator: Verne Carrow, MD  Arterial access site:  Right radial artery.   Indication: 67 yo female with HTN, DM, HLD with ongoing chest pain. EKG with ST elevation anteriorly, depression laterally. Likely c/w strain/LVH but given presentation, urgent cath was arranged. Code STEMI cancelled.                                      Procedure Details: The risks, benefits, complications, treatment options, and expected outcomes were discussed with the patient. The patient and/or family concurred with the proposed plan, giving informed consent. The patient was brought to the cath lab after IV hydration was begun and oral premedication was given. The patient was further sedated with Versed and Fentanyl. The right wrist was assessed with an Allens test which was positive. The right wrist was prepped and draped in a sterile fashion. 1% lidocaine was used for local anesthesia. Using the modified Seldinger access technique, a 5/6 French sheath was placed in the right radial artery. 3 mg Verapamil was given through the sheath. 4000 units IV heparin was given. Standard diagnostic catheters were used to perform selective coronary angiography. A pigtail catheter was used to perform a left ventricular angiogram. The sheath was removed from the right radial artery and a Terumo hemostasis band was applied at the arteriotomy site on the right wrist.   There were no immediate complications. The patient was taken to the recovery area in stable condition.   Hemodynamic Findings: Central aortic pressure: 143/56 Left ventricular pressure: 144/0/10  Angiographic Findings:  Left main: No obstructive disease.   Left Anterior Descending Artery: Large caliber vessel that  courses to the apex. There are several diagonal branches. No obstructive disease.   Circumflex Artery: Large caliber vessel with moderate caliber intermediate branch, large obtuse marginal branch. The intermediate branch has diffuse 30% stenosis. The first obtuse marginal branch has a focal distal 30% stenosis. The AV groove Circumflex has minor luminal irregularities. Left posterolateral branch has mild plaque disease.   Right Coronary Artery: Small, non-dominant vessel with mild plaque disease.   Left Ventricular Angiogram: LVEF=55%.   Impression: 1. Mild non-obstructive CAD 2. Normal LV systolic function 3. Non-cardiac chest pain-now resolved  Recommendations: No further ischemic evaluation. I would recommend ASA and statin for minor CAD. Monitor today. Likely home in am.        Complications:  None. The patient tolerated the procedure well.

## 2013-01-14 NOTE — Progress Notes (Signed)
  Subjective:    Patient ID: Tonya Carpenter, female    DOB: November 08, 1945, 67 y.o.   MRN: [id number]  Chest Pain  This is a new problem. The current episode started yesterday (evening). The onset quality is sudden. The problem occurs constantly. The problem has been gradually worsening. The pain is present in the substernal region. The pain is at a severity of 5/10. The pain is moderate. The quality of the pain is described as pressure and tightness. The pain does not radiate. Associated symptoms include dizziness and shortness of breath. Pertinent negatives include no diaphoresis or palpitations. The pain is aggravated by coughing, deep breathing, movement and walking. She has tried nitroglycerin for the symptoms. The treatment provided moderate relief. Risk factors include obesity, post-menopausal and sedentary lifestyle.  Her past medical history is significant for MI.  Her family medical history is significant for CAD in family.      Review of Systems  Constitutional: Negative for diaphoresis.  Respiratory: Positive for shortness of breath.   Cardiovascular: Positive for chest pain. Negative for palpitations.  Neurological: Positive for dizziness.  All other systems reviewed and are negative.       Objective:   Physical Exam  Constitutional: She appears well-developed and well-nourished.  Cardiovascular: Normal rate, regular rhythm, normal heart sounds and intact distal pulses.   Pt states she has a chest pain- 5 out 10   Pulmonary/Chest: Effort normal and breath sounds normal.  Psychiatric: She has a normal mood and affect. Her behavior is normal. Judgment and thought content normal.  EKG- Left BBB- St elevation v1 and v2-Adleigh-Margaret Christpher Stogsdill, FNP   Pt states chest pain is 5 out 10 Nitro given 12:45 4 baby aspirin given at 12:48 PM  Chest pain is 3/10 at 12:51 bp 153/75 pulse 78     Assessment & Plan:   1. Chest pain    To [facility] via [facility]- report given  Zania-Margaret Daphine Deutscher,  FNP

## 2013-01-15 DIAGNOSIS — R0789 Other chest pain: Secondary | ICD-10-CM | POA: Diagnosis not present

## 2013-01-15 DIAGNOSIS — R072 Precordial pain: Secondary | ICD-10-CM | POA: Diagnosis present

## 2013-01-15 LAB — GLUCOSE, CAPILLARY: Glucose-Capillary: 179 mg/dL — ABNORMAL HIGH (ref 70–99)

## 2013-01-15 MED ORDER — METFORMIN HCL 500 MG PO TABS: 250.0000 mg | ORAL_TABLET | Freq: Every evening | ORAL | Status: DC

## 2013-01-15 MED ORDER — ASPIRIN 81 MG PO TABS: 81.0000 mg | ORAL_TABLET | Freq: Every day | ORAL | Status: DC

## 2013-01-15 NOTE — Discharge Summary (Signed)
CARDIOLOGY DISCHARGE SUMMARY   Patient ID: ENNA WARWICK MRN: [id number] DOB/AGE: [birth year] 66 y.o.  Admit date: 01/14/2013 Discharge date: 01/15/2013  Primary Discharge Diagnosis:    Precordial pain  - medical therapy for nonobstructive coronary artery disease Secondary Discharge Diagnosis:    DIABETES MELLITUS   Coronary atherosclerosis of native coronary artery  Procedure Performed:  1. Left Heart Catheterization 2. Selective Coronary Angiography 3. Left ventricular angiogram  Hospital Course: Tonya Carpenter is a 67 y.o. female with no history of CAD. She had chest pain that was prolonged and she went to her primary care physician's office. She had a left bundle branch block and her symptoms were concerning for an acute MI. She received aspirin and nitroglycerin and was transferred to [facility] cone. She was taken emergently to the cath lab.  Cardiac catheterization results are below. She had nonobstructive coronary artery disease and medical therapy was recommended. She was admitted overnight for hydration and observation.  On 01/15/2013, she was seen by Dr. Eden Emms. Her cath site was without hematoma. Her chest pain had resolved. She was evaluated by Dr. Eden Emms and considered stable for discharge, to follow up as an outpatient with primary care.  Labs:  Lab Results  Component Value Date   WBC 11.8* 09/09/2011   HGB 12.9 01/14/2013   HCT 38.0 01/14/2013   MCV 85.7 09/09/2011   PLT 233 09/09/2011     Recent Labs Lab 01/14/13 1409  NA 141  K 3.4*  CL 103  BUN 10  CREATININE 1.00  GLUCOSE 205*    Cardiac Cath: 01/14/2013 Left main: No obstructive disease.  Left Anterior Descending Artery: Large caliber vessel that courses to the apex. There are several diagonal branches. No obstructive disease.  Circumflex Artery: Large caliber vessel with moderate caliber intermediate branch, large obtuse marginal branch. The intermediate branch has diffuse 30% stenosis. The first obtuse  marginal branch has a focal distal 30% stenosis. The AV groove Circumflex has minor luminal irregularities. Left posterolateral branch has mild plaque disease.  Right Coronary Artery: Small, non-dominant vessel with mild plaque disease.  Left Ventricular Angiogram: LVEF=55%.  Impression:  1. Mild non-obstructive CAD  2. Normal LV systolic function  3. Non-cardiac chest pain-now resolved  Recommendations: No further ischemic evaluation. I would recommend ASA and statin for minor CAD.  FOLLOW UP PLANS AND APPOINTMENTS No Known Allergies   Medication List         aspirin 81 MG tablet  Take 1 tablet (81 mg total) by mouth daily.     atorvastatin 20 MG tablet  Commonly known as:  LIPITOR  Take 1 tablet (20 mg total) by mouth daily.     clopidogrel 75 MG tablet  Commonly known as:  PLAVIX  Take 1 tablet (75 mg total) by mouth daily.     glimepiride 2 MG tablet  Commonly known as:  AMARYL  Take 2 mg by mouth every morning.     hydrALAZINE 10 MG tablet  Commonly known as:  APRESOLINE  Take 1 tablet (10 mg total) by mouth daily.     metFORMIN 500 MG tablet  Commonly known as:  GLUCOPHAGE  Take 0.5 tablets (250 mg total) by mouth every evening. HOLD for 48 hours, restart on 01/17/2013     metoprolol succinate 100 MG 24 hr tablet  Commonly known as:  TOPROL-XL  Take 1 tablet (100 mg total) by mouth daily.     nitroGLYCERIN 0.4 MG SL tablet  Commonly known  as:  NITROSTAT  Place 1 tablet (0.4 mg total) under the tongue every 5 (five) minutes as needed. For chest pain     quinapril-hydrochlorothiazide 20-12.5 MG per tablet  Commonly known as:  ACCURETIC  Take 1 tablet by mouth daily.        Discharge Orders   Future Appointments Provider Department Dept Phone   02/18/2013 11:00 AM Ileana Ladd, MD [facility] [phone]   Future Orders Complete By Expires   Diet - low sodium heart healthy  As directed    Diet Carb Modified  As directed    Increase  activity slowly  As directed      Follow-up Information   Follow up with [facility] (Dr. Earney Hamburg as needed)    Contact information:   [address] [place] [address]       Follow up with Redmond Baseman, MD. (As scheduled)    Specialty:  Family Medicine   Contact information:   [address] [place] [address] [phone]       BRING ALL MEDICATIONS WITH YOU TO FOLLOW UP APPOINTMENTS  Time spent with patient to include physician time: 33 min Signed: Theodore Demark, PA-C 01/15/2013, 9:30 AM Co-Sign MD

## 2013-01-15 NOTE — Progress Notes (Signed)
Patient ID: Tonya Carpenter, female   DOB: 1945-07-06, 67 y.o.   MRN: [id number]    Subjective:  Denies SSCP, palpitations or Dyspnea   Objective:  Filed Vitals:   01/14/13 1630 01/14/13 1655 01/14/13 1930 01/15/13 0413  BP: 163/73 159/69 182/85 124/74  Pulse: 64 68 74 66  Temp:   98.9 F (37.2 C) 98.6 F (37 C)  TempSrc:    Oral  Resp:   20 18  SpO2: 100% 99% 100% 96%    Intake/Output from previous day: No intake or output data in the 24 hours ending 01/15/13 0834  Physical Exam: Affect appropriate Healthy:  appears stated age HEENT: normal Neck supple with no adenopathy JVP normal no bruits no thyromegaly Lungs clear with no wheezing and good diaphragmatic motion Heart:  S1/S2 no murmur, no rub, gallop or click PMI normal Abdomen: benighn, BS positve, no tenderness, no AAA no bruit.  No HSM or HJR Distal pulses intact with no bruits No edema Neuro non-focal Skin warm and dry No muscular weakness Right radial cath sight A  Lab Results: Basic Metabolic Panel:  Recent Labs  78/29/56 1409  NA 141  K 3.4*  CL 103  GLUCOSE 205*  BUN 10  CREATININE 1.00   CBC:  Recent Labs  01/14/13 1409  HGB 12.9  HCT 38.0    Imaging: No results found.  Cardiac Studies:  ECG:  NSR LBBB   Telemetry:  NSR no arrhythmia   Echo:   Medications:   . atorvastatin  20 mg Oral q1800  . clopidogrel  75 mg Oral Q breakfast  . glimepiride  2 mg Oral QAC breakfast  . hydrALAZINE  10 mg Oral Daily  . hydrochlorothiazide  12.5 mg Oral Daily  . lisinopril  20 mg Oral Daily  . metoprolol succinate  100 mg Oral Daily       Assessment/Plan:  Chest Pain:  Post cath no significant disease CXR normal D/C home f/u Dr Modesto Charon Chol:  Continue statin DM:  Continue oral hypoglycemic target A1c  Tonya Carpenter 01/15/2013, 8:34 AM

## 2013-01-15 NOTE — Progress Notes (Signed)
Inpatient Diabetes Program Recommendations  AACE/ADA: New Consensus Statement on Inpatient Glycemic Control (2013)  Target Ranges:  Prepandial:   less than 140 mg/dL      Peak postprandial:   less than 180 mg/dL (1-2 hours)      Critically ill patients:  140 - 180 mg/dL     Admitted 40/98/11.  S/P cardiac cath.  Patient has history of Type 2 DM.   **MD- Please start Novolog Sensitive correction scale (SSI) tid ac + HS    Will follow. Ambrose Finland RN, MSN, CDE Diabetes Coordinator Inpatient Diabetes Program Team Pager: [phone] (8a-10p)

## 2013-01-15 NOTE — Progress Notes (Signed)
Chaplain received spiritual care consult request to offer "prayer" and emotional/spiritual support for pt. Pt was washing her hands in the sink when chaplain arrived in pt's room. Pt said she did not need chaplain support and that "she is supposed to be discharged."   Maurene Capes [phone]

## 2013-01-18 ENCOUNTER — Other Ambulatory Visit: Payer: Self-pay | Admitting: Family Medicine

## 2013-01-18 ENCOUNTER — Telehealth: Payer: Self-pay | Admitting: Family Medicine

## 2013-01-18 MED ORDER — OMEPRAZOLE 40 MG PO CPDR: 40.0000 mg | DELAYED_RELEASE_CAPSULE | Freq: Every day | ORAL | Status: DC

## 2013-01-18 NOTE — Telephone Encounter (Signed)
I sent prilosec to her pharmacy to p/u

## 2013-01-18 NOTE — Telephone Encounter (Signed)
PT SAID SHE IS HAVING REFLUX, BURNING IN THROAT SHE WENT TO HOSPITAL AND SAID THEY RULED OUT HEART. WANTS MED FOR REFLUX  CALLED IN TO [facility].

## 2013-01-19 ENCOUNTER — Ambulatory Visit: Payer: Medicare Other | Admitting: Family Medicine

## 2013-01-20 NOTE — Telephone Encounter (Signed)
Pt aware.

## 2013-01-21 ENCOUNTER — Encounter: Payer: Self-pay | Admitting: Family Medicine

## 2013-01-21 ENCOUNTER — Ambulatory Visit (INDEPENDENT_AMBULATORY_CARE_PROVIDER_SITE_OTHER): Payer: Medicare Other

## 2013-01-21 ENCOUNTER — Ambulatory Visit (INDEPENDENT_AMBULATORY_CARE_PROVIDER_SITE_OTHER): Payer: Medicare Other | Admitting: Family Medicine

## 2013-01-21 VITALS — BP 131/69 | HR 57 | Temp 97.0°F | Ht 63.0 in | Wt 161.0 lb

## 2013-01-21 DIAGNOSIS — I10 Essential (primary) hypertension: Secondary | ICD-10-CM

## 2013-01-21 DIAGNOSIS — E785 Hyperlipidemia, unspecified: Secondary | ICD-10-CM

## 2013-01-21 DIAGNOSIS — E119 Type 2 diabetes mellitus without complications: Secondary | ICD-10-CM

## 2013-01-21 DIAGNOSIS — R079 Chest pain, unspecified: Secondary | ICD-10-CM | POA: Insufficient documentation

## 2013-01-21 DIAGNOSIS — I2510 Atherosclerotic heart disease of native coronary artery without angina pectoris: Secondary | ICD-10-CM

## 2013-01-21 LAB — POCT GLYCOSYLATED HEMOGLOBIN (HGB A1C): Hemoglobin A1C: 7.4

## 2013-01-21 NOTE — Progress Notes (Signed)
Patient ID: Tonya Carpenter, female   DOB: [birth year], 67 y.o.   MRN: [id number] SUBJECTIVE: CC: Chief Complaint  Patient presents with  . Hospitalization Follow-up    went to [place] states doing better     HPI: Here for follow up was hospitalized 01/14/2013. For chest pain . Presented to [facility] and transported to [facility]. Underwent cardiac cath was non obstructive CAD. Has risk factors. Body sore. Chest a little sore. After the pain she was discharged and she felt like she was burning up with fever.  Also here for follow up of her medical problems  Patient is here for follow up of Diabetes Mellitus: Symptoms evaluated: Denies Nocturia ,Denies Urinary Frequency , denies Blurred vision ,deniesDizziness,denies.Dysuria,denies paresthesias, denies extremity pain or ulcers..Still has chest pain. has had an annual eye exam. do check the feet. Does check CBGs. Average NWG:NFAO am was 166. Denies episodes of hypoglycemia. Does have an emergency hypoglycemic plan. admits toCompliance with medications. Problems with medications.: metformin causes diarrhea.   Past Medical History  Diagnosis Date  . Hypertension   . Type 2 diabetes mellitus   . Hyperlipidemia   . Obesity   . Anxiety   . Coronary artery disease   . Myocardial infarction     "several years back" (01/14/2013)  . Asthma   . Exertional shortness of breath   . Stroke     "light stroke long years ago; didn't affect me permanently" (01/14/2013)  . Arthritis     "hands" (01/14/2013)   Past Surgical History  Procedure Laterality Date  . Tubal ligation    . Cholecystectomy    . Tonsillectomy    . Appendectomy    . Cardiovascular stress test  12/2009    no evidence for stress-induced reversibility or ischemia.  She had a fixed anterior septal wall defect, possibly related to breast attenuation and her EF was 54%.  . Cardiac catheterization  2008    no angiographically significant CAD  . Cardiac catheterization  01/14/2013   . Vaginal hysterectomy    . Dilation and curettage of uterus     History   Social History  . Marital Status: Married    Spouse Name: N/A    Number of Children: 2  . Years of Education: N/A   Occupational History  . Disability    Social History Main Topics  . Smoking status: Never Smoker   . Smokeless tobacco: Never Used  . Alcohol Use: No  . Drug Use: No  . Sexual Activity: No   Other Topics Concern  . Not on file   Social History Narrative   Lives in [place], [place].    Family History  Problem Relation Age of Onset  . Hypertension Mother    Current Outpatient Prescriptions on File Prior to Visit  Medication Sig Dispense Refill  . aspirin 81 MG tablet Take 1 tablet (81 mg total) by mouth daily.  30 tablet    . atorvastatin (LIPITOR) 20 MG tablet Take 1 tablet (20 mg total) by mouth daily.  30 tablet  5  . clopidogrel (PLAVIX) 75 MG tablet Take 1 tablet (75 mg total) by mouth daily.  30 tablet  5  . glimepiride (AMARYL) 2 MG tablet Take 2 mg by mouth every morning.      . hydrALAZINE (APRESOLINE) 10 MG tablet Take 1 tablet (10 mg total) by mouth daily.  30 tablet  5  . metoprolol succinate (TOPROL-XL) 100 MG 24 hr tablet Take 1 tablet (100 mg  total) by mouth daily.  30 tablet  5  . nitroGLYCERIN (NITROSTAT) 0.4 MG SL tablet Place 1 tablet (0.4 mg total) under the tongue every 5 (five) minutes as needed. For chest pain  25 tablet  1  . omeprazole (PRILOSEC) 40 MG capsule Take 1 capsule (40 mg total) by mouth daily.  30 capsule  3  . quinapril-hydrochlorothiazide (ACCURETIC) 20-12.5 MG per tablet Take 1 tablet by mouth daily.  30 tablet  5   No current facility-administered medications on file prior to visit.   No Known Allergies Immunization History  Administered Date(s) Administered  . Influenza,inj,Quad PF,36+ Mos 12/18/2012  . Pneumococcal-Generic 12/18/2012   Prior to Admission medications   Medication Sig Start Date End Date Taking? Authorizing Provider  aspirin  81 MG tablet Take 1 tablet (81 mg total) by mouth daily. 01/15/13  Yes Rhonda G Barrett, PA-C  atorvastatin (LIPITOR) 20 MG tablet Take 1 tablet (20 mg total) by mouth daily. 09/24/12  Yes Ileana Ladd, MD  clopidogrel (PLAVIX) 75 MG tablet Take 1 tablet (75 mg total) by mouth daily. 09/24/12  Yes Ileana Ladd, MD  glimepiride (AMARYL) 2 MG tablet Take 2 mg by mouth every morning.   Yes Historical Provider, MD  hydrALAZINE (APRESOLINE) 10 MG tablet Take 1 tablet (10 mg total) by mouth daily. 09/24/12  Yes Ileana Ladd, MD  metFORMIN (GLUCOPHAGE) 500 MG tablet Take 0.5 tablets (250 mg total) by mouth every evening. HOLD for 48 hours, restart on 01/17/2013 01/15/13  Yes Rhonda G Barrett, PA-C  metoprolol succinate (TOPROL-XL) 100 MG 24 hr tablet Take 1 tablet (100 mg total) by mouth daily. 09/24/12  Yes Ileana Ladd, MD  nitroGLYCERIN (NITROSTAT) 0.4 MG SL tablet Place 1 tablet (0.4 mg total) under the tongue every 5 (five) minutes as needed. For chest pain 09/24/12  Yes Ileana Ladd, MD  omeprazole (PRILOSEC) 40 MG capsule Take 1 capsule (40 mg total) by mouth daily. 01/18/13  Yes Deatra Canter, FNP  quinapril-hydrochlorothiazide (ACCURETIC) 20-12.5 MG per tablet Take 1 tablet by mouth daily. 09/24/12  Yes Ileana Ladd, MD     ROS: As above in the HPI. All other systems are stable or negative.  OBJECTIVE: APPEARANCE:  Patient in no acute distress.The patient appeared well nourished and normally developed. Acyanotic. Waist: VITAL SIGNS:BP 131/69  Pulse 57  Temp(Src) 97 F (36.1 C) (Oral)  Ht 5\' 3"  (1.6 m)  Wt 161 lb (73.029 kg)  BMI 28.53 kg/m2  SpO2 98%  AAF anxious about her health  Reviewed her records and work up in EPIC  SKIN: warm and  Dry without overt rashes, tattoos and scars  HEAD and Neck: without JVD, Head and scalp: normal Eyes:No scleral icterus. Fundi normal, eye movements normal. Ears: Auricle normal, canal normal, Tympanic membranes normal, insufflation  normal. Nose: normal Throat: normal Neck & thyroid: normal  CHEST & LUNGS: Chest wall: normal Lungs: Clear  CVS: Reveals the PMI to be normally located. Regular rhythm, First and Second Heart sounds are normal,  absence of murmurs, rubs or gallops. Peripheral vasculature: Radial pulses: normal Dorsal pedis pulses: normal Posterior pulses: normal  ABDOMEN:  Appearance: normal Benign, no organomegaly, no masses, no Abdominal Aortic enlargement. No Guarding , no rebound. No Bruits. Bowel sounds: normal  RECTAL: N/A GU: N/A  EXTREMETIES: nonedematous.  MUSCULOSKELETAL:  Spine: normal Joints: intact  NEUROLOGIC: oriented to time,place and person; nonfocal. Strength is normal Sensory is normal Reflexes are normal Cranial Nerves  are normal. Results for orders placed in visit on 01/21/13  POCT GLYCOSYLATED HEMOGLOBIN (HGB A1C)      Result Value Range   Hemoglobin A1C 7.4      ASSESSMENT: Chest pain - Plan: TSH, DG Chest 2 View, Amylase, Lipase  CAD, multiple vessel  DIABETES MELLITUS - Plan: POCT glycosylated hemoglobin (Hb A1C), CMP14+EGFR  HYPERLIPIDEMIA-MIXED - Plan: CMP14+EGFR, Lipid panel  HYPERTENSION, UNSPECIFIED - Plan: CMP14+EGFR Non cardiac chest pain  PLAN: Await the rest of the labs  Orders Placed This Encounter  Procedures  . DG Chest 2 View    Standing Status: Future     Number of Occurrences: 1     Standing Expiration Date: 03/23/2014    Order Specific Question:  Reason for Exam (SYMPTOM  OR DIAGNOSIS REQUIRED)    Answer:  non cardiac chest pain    Order Specific Question:  Preferred imaging location?    Answer:  Internal  . CMP14+EGFR  . Lipid panel  . TSH  . Amylase  . Lipase  . POCT glycosylated hemoglobin (Hb A1C)  [facility] reading (PRIMARY) by  Dr. Modesto Charon: tortuos aorta. No acute findings. Cardiac cath: non obstructive cardiac disease. D/C the metformin due to diarrhea which is intolerant.                           DM footcare in the  AVS and  Discussion. Annual eye exam.  No orders of the defined types were placed in this encounter.   Medications Discontinued During This Encounter  Medication Reason  . metFORMIN (GLUCOPHAGE) 500 MG tablet Side effect (s)   Return in about 4 weeks (around 02/18/2013) for Recheck medical problems.  Tammi Boulier P. Modesto Charon, M.D.

## 2013-01-21 NOTE — Patient Instructions (Signed)
Diabetes and Foot Care Diabetes may cause you to have problems because of poor blood supply (circulation) to your feet and legs. This may cause the skin on your feet to become thinner, break easier, and heal more slowly. Your skin may become dry, and the skin may peel and crack. You may also have nerve damage in your legs and feet causing decreased feeling in them. You may not notice minor injuries to your feet that could lead to infections or more serious problems. Taking care of your feet is one of the most important things you can do for yourself.  HOME CARE INSTRUCTIONS  Wear shoes at all times, even in the house. Do not go barefoot. Bare feet are easily injured.  Check your feet daily for blisters, cuts, and redness. If you cannot see the bottom of your feet, use a mirror or ask someone for help.  Wash your feet with warm water (do not use hot water) and mild soap. Then pat your feet and the areas between your toes until they are completely dry. Do not soak your feet as this can dry your skin.  Apply a moisturizing lotion or petroleum jelly (that does not contain alcohol and is unscented) to the skin on your feet and to dry, brittle toenails. Do not apply lotion between your toes.  Trim your toenails straight across. Do not dig under them or around the cuticle. File the edges of your nails with an emery board or nail file.  Do not cut corns or calluses or try to remove them with medicine.  Wear clean socks or stockings every day. Make sure they are not too tight. Do not wear knee-high stockings since they may decrease blood flow to your legs.  Wear shoes that fit properly and have enough cushioning. To break in new shoes, wear them for just a few hours a day. This prevents you from injuring your feet. Always look in your shoes before you put them on to be sure there are no objects inside.  Do not cross your legs. This may decrease the blood flow to your feet.  If you find a minor scrape,  cut, or break in the skin on your feet, keep it and the skin around it clean and dry. These areas may be cleansed with mild soap and water. Do not cleanse the area with peroxide, alcohol, or iodine.  When you remove an adhesive bandage, be sure not to damage the skin around it.  If you have a wound, look at it several times a day to make sure it is healing.  Do not use heating pads or hot water bottles. They may burn your skin. If you have lost feeling in your feet or legs, you may not know it is happening until it is too late.  Make sure your health care provider performs a complete foot exam at least annually or more often if you have foot problems. Report any cuts, sores, or bruises to your health care provider immediately. SEEK MEDICAL CARE IF:   You have an injury that is not healing.  You have cuts or breaks in the skin.  You have an ingrown nail.  You notice redness on your legs or feet.  You feel burning or tingling in your legs or feet.  You have pain or cramps in your legs and feet.  Your legs or feet are numb.  Your feet always feel cold. SEEK IMMEDIATE MEDICAL CARE IF:   There is increasing redness,   swelling, or pain in or around a wound.  There is a red line that goes up your leg.  Pus is coming from a wound.  You develop a fever or as directed by your health care provider.  You notice a bad smell coming from an ulcer or wound. Document Released: 03/01/2000 Document Revised: 11/04/2012 Document Reviewed: 08/11/2012 Exit[employer] Patient Information 2014 [employer].  

## 2013-01-22 ENCOUNTER — Telehealth: Payer: Self-pay | Admitting: Family Medicine

## 2013-01-22 LAB — CMP14+EGFR
ALT: 11 IU/L (ref 0–32)
AST: 13 IU/L (ref 0–40)
Albumin/Globulin Ratio: 1.2 (ref 1.1–2.5)
Albumin: 4 g/dL (ref 3.6–4.8)
Alkaline Phosphatase: 58 IU/L (ref 39–117)
BUN/Creatinine Ratio: 14 (ref 11–26)
BUN: 16 mg/dL (ref 8–27)
CO2: 27 mmol/L (ref 18–29)
Calcium: 9.7 mg/dL (ref 8.6–10.2)
Chloride: 94 mmol/L — ABNORMAL LOW (ref 97–108)
Creatinine, Ser: 1.11 mg/dL — ABNORMAL HIGH (ref 0.57–1.00)
GFR calc Af Amer: 60 mL/min/{1.73_m2} (ref >59)
GFR calc non Af Amer: 52 mL/min/{1.73_m2} — ABNORMAL LOW (ref >59)
Globulin, Total: 3.3 g/dL (ref 1.5–4.5)
Glucose: 152 mg/dL — ABNORMAL HIGH (ref 65–99)
Potassium: 3.9 mmol/L (ref 3.5–5.2)
Sodium: 139 mmol/L (ref 134–144)
Total Bilirubin: 0.4 mg/dL (ref 0.0–1.2)
Total Protein: 7.3 g/dL (ref 6.0–8.5)

## 2013-01-22 LAB — LIPID PANEL
Chol/HDL Ratio: 2.7 ratio units (ref 0.0–4.4)
Cholesterol, Total: 129 mg/dL (ref 100–199)
HDL: 47 mg/dL (ref >39)
LDL Calculated: 65 mg/dL (ref 0–99)
Triglycerides: 85 mg/dL (ref 0–149)
VLDL Cholesterol Cal: 17 mg/dL (ref 5–40)

## 2013-01-22 LAB — LIPASE: Lipase: 59 U/L (ref 0–59)

## 2013-01-22 LAB — AMYLASE: Amylase: 64 U/L (ref 31–124)

## 2013-01-22 LAB — TSH: TSH: 2.86 u[IU]/mL (ref 0.450–4.500)

## 2013-01-22 NOTE — Telephone Encounter (Signed)
Does she need to still take her metformin and if so can she just take one a day please called back before today?

## 2013-01-22 NOTE — Progress Notes (Signed)
Quick Note:  Call Patient Labs abnormal:DM not exactly at goal.  Recommendations: Diet and exercise would help achieve this without changing medication doses.    ______

## 2013-01-22 NOTE — Telephone Encounter (Signed)
Continue with metformin.  

## 2013-01-26 NOTE — Telephone Encounter (Signed)
Pt aware to continue

## 2013-01-28 ENCOUNTER — Ambulatory Visit: Payer: Medicare Other | Admitting: Family Medicine

## 2013-02-18 ENCOUNTER — Encounter: Payer: Self-pay | Admitting: Family Medicine

## 2013-02-18 ENCOUNTER — Ambulatory Visit (INDEPENDENT_AMBULATORY_CARE_PROVIDER_SITE_OTHER): Payer: Medicare Other | Admitting: Family Medicine

## 2013-02-18 VITALS — BP 166/73 | HR 58 | Temp 97.9°F | Ht 61.0 in | Wt 159.8 lb

## 2013-02-18 DIAGNOSIS — R072 Precordial pain: Secondary | ICD-10-CM

## 2013-02-18 DIAGNOSIS — I2510 Atherosclerotic heart disease of native coronary artery without angina pectoris: Secondary | ICD-10-CM

## 2013-02-18 DIAGNOSIS — E119 Type 2 diabetes mellitus without complications: Secondary | ICD-10-CM

## 2013-02-18 DIAGNOSIS — R011 Cardiac murmur, unspecified: Secondary | ICD-10-CM

## 2013-02-18 DIAGNOSIS — IMO0001 Reserved for inherently not codable concepts without codable children: Secondary | ICD-10-CM | POA: Insufficient documentation

## 2013-02-18 DIAGNOSIS — R350 Frequency of micturition: Secondary | ICD-10-CM | POA: Insufficient documentation

## 2013-02-18 DIAGNOSIS — E785 Hyperlipidemia, unspecified: Secondary | ICD-10-CM

## 2013-02-18 DIAGNOSIS — R079 Chest pain, unspecified: Secondary | ICD-10-CM

## 2013-02-18 DIAGNOSIS — I10 Essential (primary) hypertension: Secondary | ICD-10-CM

## 2013-02-18 LAB — POCT URINALYSIS DIPSTICK
Bilirubin, UA: NEGATIVE
Blood, UA: NEGATIVE
Glucose, UA: NEGATIVE
Ketones, UA: NEGATIVE
Leukocytes, UA: NEGATIVE
Nitrite, UA: NEGATIVE
Protein, UA: NEGATIVE
Spec Grav, UA: <=1.005
Urobilinogen, UA: NEGATIVE
pH, UA: 6

## 2013-02-18 LAB — POCT UA - MICROSCOPIC ONLY
Bacteria, U Microscopic: NEGATIVE
Casts, Ur, LPF, POC: NEGATIVE
Crystals, Ur, HPF, POC: NEGATIVE
Mucus, UA: NEGATIVE
RBC, urine, microscopic: NEGATIVE
WBC, Ur, HPF, POC: NEGATIVE
Yeast, UA: NEGATIVE

## 2013-02-18 NOTE — Patient Instructions (Signed)
Diabetes and Foot Care Diabetes may cause you to have problems because of poor blood supply (circulation) to your feet and legs. This may cause the skin on your feet to become thinner, break easier, and heal more slowly. Your skin may become dry, and the skin may peel and crack. You may also have nerve damage in your legs and feet causing decreased feeling in them. You may not notice minor injuries to your feet that could lead to infections or more serious problems. Taking care of your feet is one of the most important things you can do for yourself.  HOME CARE INSTRUCTIONS  Wear shoes at all times, even in the house. Do not go barefoot. Bare feet are easily injured.  Check your feet daily for blisters, cuts, and redness. If you cannot see the bottom of your feet, use a mirror or ask someone for help.  Wash your feet with warm water (do not use hot water) and mild soap. Then pat your feet and the areas between your toes until they are completely dry. Do not soak your feet as this can dry your skin.  Apply a moisturizing lotion or petroleum jelly (that does not contain alcohol and is unscented) to the skin on your feet and to dry, brittle toenails. Do not apply lotion between your toes.  Trim your toenails straight across. Do not dig under them or around the cuticle. File the edges of your nails with an emery board or nail file.  Do not cut corns or calluses or try to remove them with medicine.  Wear clean socks or stockings every day. Make sure they are not too tight. Do not wear knee-high stockings since they may decrease blood flow to your legs.  Wear shoes that fit properly and have enough cushioning. To break in new shoes, wear them for just a few hours a day. This prevents you from injuring your feet. Always look in your shoes before you put them on to be sure there are no objects inside.  Do not cross your legs. This may decrease the blood flow to your feet.  If you find a minor scrape,  cut, or break in the skin on your feet, keep it and the skin around it clean and dry. These areas may be cleansed with mild soap and water. Do not cleanse the area with peroxide, alcohol, or iodine.  When you remove an adhesive bandage, be sure not to damage the skin around it.  If you have a wound, look at it several times a day to make sure it is healing.  Do not use heating pads or hot water bottles. They may burn your skin. If you have lost feeling in your feet or legs, you may not know it is happening until it is too late.  Make sure your health care provider performs a complete foot exam at least annually or more often if you have foot problems. Report any cuts, sores, or bruises to your health care provider immediately. SEEK MEDICAL CARE IF:   You have an injury that is not healing.  You have cuts or breaks in the skin.  You have an ingrown nail.  You notice redness on your legs or feet.  You feel burning or tingling in your legs or feet.  You have pain or cramps in your legs and feet.  Your legs or feet are numb.  Your feet always feel cold. SEEK IMMEDIATE MEDICAL CARE IF:   There is increasing redness,   swelling, or pain in or around a wound.  There is a red line that goes up your leg.  Pus is coming from a wound.  You develop a fever or as directed by your health care provider.  You notice a bad smell coming from an ulcer or wound. Document Released: 03/01/2000 Document Revised: 11/04/2012 Document Reviewed: 08/11/2012 Exit[employer] Patient Information 2014 [employer].  

## 2013-02-18 NOTE — Progress Notes (Signed)
Patient ID: Tonya Carpenter, female   DOB: [birth year], 67 y.o.   MRN: [id number] SUBJECTIVE: CC: Chief Complaint  Patient presents with  . Follow-up    3 MONTH CK UP ON DIABETES    HPI:  Patient is here for follow up of Diabetes Mellitus/HTN/HLD: Symptoms evaluated: Denies Nocturia ,Denies Urinary Frequency , denies Blurred vision ,deniesDizziness,denies.Dysuria,denies paresthesias, denies extremity pain or ulcers.Marland Kitchendenies chest pain. has had an annual eye exam. do check the feet. Does check CBGs. Average CBG:75- 194 Denies episodes of hypoglycemia. Does have an emergency hypoglycemic plan. admits toCompliance with medications. Problems with medications.diarrhea when takes 2 metformin. only  Stress, family member murdered  No chest pain now  Past Medical History  Diagnosis Date  . Hypertension   . Type 2 diabetes mellitus   . Hyperlipidemia   . Obesity   . Anxiety   . Coronary artery disease   . Myocardial infarction     "several years back" (01/14/2013)  . Asthma   . Exertional shortness of breath   . Stroke     "light stroke long years ago; didn't affect me permanently" (01/14/2013)  . Arthritis     "hands" (01/14/2013)   Past Surgical History  Procedure Laterality Date  . Tubal ligation    . Cholecystectomy    . Tonsillectomy    . Appendectomy    . Cardiovascular stress test  12/2009    no evidence for stress-induced reversibility or ischemia.  She had a fixed anterior septal wall defect, possibly related to breast attenuation and her EF was 54%.  . Cardiac catheterization  2008    no angiographically significant CAD  . Cardiac catheterization  01/14/2013  . Vaginal hysterectomy    . Dilation and curettage of uterus     History   Social History  . Marital Status: Married    Spouse Name: N/A    Number of Children: 2  . Years of Education: N/A   Occupational History  . Disability    Social History Main Topics  . Smoking status: Never Smoker   .  Smokeless tobacco: Never Used  . Alcohol Use: No  . Drug Use: No  . Sexual Activity: No   Other Topics Concern  . Not on file   Social History Narrative   Lives in [place], [place].    Family History  Problem Relation Age of Onset  . Hypertension Mother    Current Outpatient Prescriptions on File Prior to Visit  Medication Sig Dispense Refill  . aspirin 81 MG tablet Take 1 tablet (81 mg total) by mouth daily.  30 tablet    . atorvastatin (LIPITOR) 20 MG tablet Take 1 tablet (20 mg total) by mouth daily.  30 tablet  5  . clopidogrel (PLAVIX) 75 MG tablet Take 1 tablet (75 mg total) by mouth daily.  30 tablet  5  . glimepiride (AMARYL) 2 MG tablet Take 2 mg by mouth every morning.      . hydrALAZINE (APRESOLINE) 10 MG tablet Take 1 tablet (10 mg total) by mouth daily.  30 tablet  5  . metoprolol succinate (TOPROL-XL) 100 MG 24 hr tablet Take 1 tablet (100 mg total) by mouth daily.  30 tablet  5  . nitroGLYCERIN (NITROSTAT) 0.4 MG SL tablet Place 1 tablet (0.4 mg total) under the tongue every 5 (five) minutes as needed. For chest pain  25 tablet  1  . omeprazole (PRILOSEC) 40 MG capsule Take 1 capsule (40 mg total) by mouth  daily.  30 capsule  3  . quinapril-hydrochlorothiazide (ACCURETIC) 20-12.5 MG per tablet Take 1 tablet by mouth daily.  30 tablet  5   No current facility-administered medications on file prior to visit.   No Known Allergies Immunization History  Administered Date(s) Administered  . Influenza,inj,Quad PF,36+ Mos 12/18/2012  . Pneumococcal-Unspecified 12/18/2012   Prior to Admission medications   Medication Sig Start Date End Date Taking? Authorizing Provider  aspirin 81 MG tablet Take 1 tablet (81 mg total) by mouth daily. 01/15/13   Rhonda G Barrett, PA-C  atorvastatin (LIPITOR) 20 MG tablet Take 1 tablet (20 mg total) by mouth daily. 09/24/12   Ileana Ladd, MD  clopidogrel (PLAVIX) 75 MG tablet Take 1 tablet (75 mg total) by mouth daily. 09/24/12   Ileana Ladd, MD  glimepiride (AMARYL) 2 MG tablet Take 2 mg by mouth every morning.    Historical Provider, MD  hydrALAZINE (APRESOLINE) 10 MG tablet Take 1 tablet (10 mg total) by mouth daily. 09/24/12   Ileana Ladd, MD  metoprolol succinate (TOPROL-XL) 100 MG 24 hr tablet Take 1 tablet (100 mg total) by mouth daily. 09/24/12   Ileana Ladd, MD  nitroGLYCERIN (NITROSTAT) 0.4 MG SL tablet Place 1 tablet (0.4 mg total) under the tongue every 5 (five) minutes as needed. For chest pain 09/24/12   Ileana Ladd, MD  omeprazole (PRILOSEC) 40 MG capsule Take 1 capsule (40 mg total) by mouth daily. 01/18/13   Deatra Canter, FNP  quinapril-hydrochlorothiazide (ACCURETIC) 20-12.5 MG per tablet Take 1 tablet by mouth daily. 09/24/12   Ileana Ladd, MD     ROS: As above in the HPI. All other systems are stable or negative.  OBJECTIVE: APPEARANCE:  Patient in no acute distress.The patient appeared well nourished and normally developed. Acyanotic. Waist: VITAL SIGNS:BP 166/73  Pulse 58  Temp(Src) 97.9 F (36.6 C) (Oral)  Ht 5\' 1"  (1.549 m)  Wt 159 lb 12.8 oz (72.485 kg)  BMI 30.21 kg/m2 AAF BP 140/80  SKIN: warm and  Dry without overt rashes, tattoos and scars  HEAD and Neck: without JVD, Head and scalp: normal Eyes:No scleral icterus. Fundi normal, eye movements normal. Ears: Auricle normal, canal normal, Tympanic membranes normal, insufflation normal. Nose: normal Throat: normal Neck & thyroid: normal  CHEST & LUNGS: Chest wall: normal Lungs: Clear  CVS: Reveals the PMI to be normally located. Regular rhythm, First and Second Heart sounds are normal,  absence of murmurs, rubs or gallops. Peripheral vasculature: Radial pulses: normal Dorsal pedis pulses: normal Posterior pulses: normal  ABDOMEN:  Appearance: normal Benign, no organomegaly, no masses, no Abdominal Aortic enlargement. No Guarding , no rebound. No Bruits. Bowel sounds: normal  RECTAL: N/A GU:  N/A  EXTREMETIES: nonedematous.  MUSCULOSKELETAL:  Spine: normal Joints: intact  NEUROLOGIC: oriented to time,place and person; nonfocal. Strength is normal Sensory is normal Reflexes are normal Cranial Nerves are normal.  ASSESSMENT:  Frequency - Plan: POCT urinalysis dipstick, POCT UA - Microscopic Only  CAD, multiple vessel  DIABETES MELLITUS  HYPERLIPIDEMIA-MIXED  HYPERTENSION, UNSPECIFIED  Precordial pain  CARDIAC MURMUR  CHEST PAIN UNSPECIFIED  PLAN:      Dr Woodroe Mode Recommendations  For nutrition information, I recommend books:  1).Eat to Live by Dr Monico Hoar. 2).Prevent and Reverse Heart Disease by Dr Suzzette Righter. 3) Dr Katherina Right Book:  Program to Reverse Diabetes  Exercise recommendations are:  If unable to walk, then the patient can exercise in  a chair 3 times a day. By flapping arms like a bird gently and raising legs outwards to the front.  If ambulatory, the patient can go for walks for 30 minutes 3 times a week. Then increase the intensity and duration as tolerated.  Goal is to try to attain exercise frequency to 5 times a week.  If applicable: Best to perform resistance exercises (machines or weights) 2 days a week and cardio type exercises 3 days per week.  DM foot care.  Results for orders placed in visit on 02/18/13  POCT URINALYSIS DIPSTICK      Result Value Range   Color, UA yellow     Clarity, UA clear     Glucose, UA neg     Bilirubin, UA neg     Ketones, UA neg     Spec Grav, UA <=1.005     Blood, UA neg     pH, UA 6.0     Protein, UA neg     Urobilinogen, UA negative     Nitrite, UA neg     Leukocytes, UA Negative    POCT UA - MICROSCOPIC ONLY      Result Value Range   WBC, Ur, HPF, POC neg     RBC, urine, microscopic neg     Bacteria, U Microscopic neg     Mucus, UA neg     Epithelial cells, urine per micros occ     Crystals, Ur, HPF, POC neg     Casts, Ur, LPF, POC neg     Yeast, UA neg       Orders Placed This Encounter  Procedures  . POCT urinalysis dipstick  . POCT UA - Microscopic Only   Meds ordered this encounter  Medications  . metFORMIN (GLUCOPHAGE) 500 MG tablet    Sig:    There are no discontinued medications. Return in about 2 months (around 04/21/2013) for Recheck medical problems.  Jedaiah Rathbun P. Modesto Charon, M.D.

## 2013-02-24 ENCOUNTER — Telehealth: Payer: Self-pay | Admitting: Family Medicine

## 2013-02-26 ENCOUNTER — Ambulatory Visit (INDEPENDENT_AMBULATORY_CARE_PROVIDER_SITE_OTHER): Payer: Medicare Other | Admitting: General Practice

## 2013-02-26 ENCOUNTER — Encounter: Payer: Self-pay | Admitting: General Practice

## 2013-02-26 VITALS — BP 122/71 | HR 61 | Temp 97.3°F | Ht 61.0 in | Wt 156.0 lb

## 2013-02-26 DIAGNOSIS — L309 Dermatitis, unspecified: Secondary | ICD-10-CM

## 2013-02-26 DIAGNOSIS — L259 Unspecified contact dermatitis, unspecified cause: Secondary | ICD-10-CM

## 2013-02-26 NOTE — Progress Notes (Signed)
   Subjective:    Patient ID: Tonya Carpenter, female    DOB: 09-18-1945, 67 y.o.   MRN: [id number]  HPI Patient presents today with complaints of left nipple dryness. Reports tenderness and stinging at left nipple area after taking a shower. Denies drainage or redness.  Mammogram in April 2014.    Review of Systems  Respiratory: Negative for chest tightness and shortness of breath.   Cardiovascular: Negative for chest pain and palpitations.  Skin:       Dry, scaly area to left nipple       Objective:   Physical Exam  Constitutional: She is oriented to person, place, and time. She appears well-developed and well-nourished.  Pulmonary/Chest: Effort normal and breath sounds normal. No respiratory distress. She exhibits no tenderness.  Neurological: She is alert and oriented to person, place, and time.  Skin: Skin is warm and dry.  Dry, scaly, darker pigmented area to left nipple (12noon location). Negative for erythema of drainage.   Psychiatric: She has a normal mood and affect.          Assessment & Plan:  1. Eczema -discussed and provided information sheet on eczema -discussed moisturizing skin -RTO if symptoms worsen or unresolved -Patient verbalized understanding Coralie Keens, FNP-C

## 2013-02-26 NOTE — Telephone Encounter (Signed)
appt today with mae 

## 2013-02-26 NOTE — Patient Instructions (Signed)

## 2013-03-22 ENCOUNTER — Other Ambulatory Visit: Payer: Self-pay | Admitting: Family Medicine

## 2013-03-30 ENCOUNTER — Other Ambulatory Visit: Payer: Self-pay | Admitting: Family Medicine

## 2013-03-30 ENCOUNTER — Telehealth: Payer: Self-pay

## 2013-03-30 NOTE — Telephone Encounter (Signed)
03-30-13 no answer

## 2013-03-30 NOTE — Telephone Encounter (Signed)
Patient said Metformin is making her sick - giving diarrhea

## 2013-03-30 NOTE — Telephone Encounter (Signed)
Office visit to review medications. Bring all medications and sugar results on her finger sticks. Shontae Rosiles P. Modesto CharonWong, M.D.

## 2013-04-01 ENCOUNTER — Telehealth: Payer: Self-pay

## 2013-04-01 NOTE — Telephone Encounter (Signed)
Spoke with pt states metformin causing diarrhea --declined earlier appt

## 2013-04-12 ENCOUNTER — Telehealth: Payer: Self-pay | Admitting: Family Medicine

## 2013-04-13 ENCOUNTER — Encounter (INDEPENDENT_AMBULATORY_CARE_PROVIDER_SITE_OTHER): Payer: Self-pay

## 2013-04-13 ENCOUNTER — Ambulatory Visit (INDEPENDENT_AMBULATORY_CARE_PROVIDER_SITE_OTHER): Payer: Medicare HMO | Admitting: Pharmacist Clinician (PhC)/ Clinical Pharmacy Specialist

## 2013-04-13 ENCOUNTER — Encounter: Payer: Self-pay | Admitting: Pharmacist Clinician (PhC)/ Clinical Pharmacy Specialist

## 2013-04-13 DIAGNOSIS — IMO0001 Reserved for inherently not codable concepts without codable children: Secondary | ICD-10-CM

## 2013-04-13 DIAGNOSIS — E1165 Type 2 diabetes mellitus with hyperglycemia: Principal | ICD-10-CM

## 2013-04-13 NOTE — Progress Notes (Signed)
Mrs. Tonya Carpenter comes in today regarding elevated blood glucose readings which I am able to trace back to when Dr. Modesto CharonWong decreased her metformin from 500mg  bid to qd secondary to GI upset.  The GI upset has resolved, however BG reading have increased 80pts or more.  She is willing to retry metformin twice a day.  The plan is to add a 1/2 tablet first after her evening meal and if tolerated after a week to increase to a full table twice a day.  Patent is agreeable.  She is very excited to be off of insulin and feels much better.  I reviewed with patient her eating habits and CHO restrictions.

## 2013-04-17 ENCOUNTER — Other Ambulatory Visit: Payer: Self-pay | Admitting: Family Medicine

## 2013-04-22 ENCOUNTER — Telehealth: Payer: Self-pay | Admitting: Pharmacist

## 2013-04-23 ENCOUNTER — Ambulatory Visit (INDEPENDENT_AMBULATORY_CARE_PROVIDER_SITE_OTHER): Payer: Medicare HMO | Admitting: Family Medicine

## 2013-04-23 ENCOUNTER — Encounter: Payer: Self-pay | Admitting: Family Medicine

## 2013-04-23 VITALS — BP 138/80 | HR 80 | Temp 97.0°F | Ht 61.0 in | Wt 154.8 lb

## 2013-04-23 DIAGNOSIS — R011 Cardiac murmur, unspecified: Secondary | ICD-10-CM

## 2013-04-23 DIAGNOSIS — I2510 Atherosclerotic heart disease of native coronary artery without angina pectoris: Secondary | ICD-10-CM

## 2013-04-23 DIAGNOSIS — I10 Essential (primary) hypertension: Secondary | ICD-10-CM

## 2013-04-23 DIAGNOSIS — E119 Type 2 diabetes mellitus without complications: Secondary | ICD-10-CM

## 2013-04-23 DIAGNOSIS — E785 Hyperlipidemia, unspecified: Secondary | ICD-10-CM

## 2013-04-23 LAB — POCT GLYCOSYLATED HEMOGLOBIN (HGB A1C): Hemoglobin A1C: 8.5

## 2013-04-23 NOTE — Progress Notes (Signed)
Patient ID: Tonya Carpenter, female   DOB: 03-01-1946, 68 y.o.   MRN: [id number] SUBJECTIVE: CC: Chief Complaint  Patient presents with  . Follow-up    2 month follow up    HPI:  Patient is here for follow up of Diabetes Mellitus/HTN/HLD: Symptoms evaluated: Denies Nocturia ,Denies Urinary Frequency , denies Blurred vision ,deniesDizziness,denies.Dysuria,denies paresthesias, denies extremity pain or ulcers.Marland Kitchendenies chest pain. has had an annual eye exam. do check the feet. Does check CBGs. Average GEZ:MOQHUTM her readings. Initially fasting 140-150s improved with diet and increasing activities and medication adjustment. Now 120s. Denies episodes of hypoglycemia. Does have an emergency hypoglycemic plan. admits toCompliance with medications. Problems with medications.was having diarrhea with the metformin, but the clinical pharmacist had her split up the metformin after lunch and supper, and it worked better.    Past Medical History  Diagnosis Date  . Hypertension   . Type 2 diabetes mellitus   . Hyperlipidemia   . Obesity   . Anxiety   . Coronary artery disease   . Myocardial infarction     "several years back" (01/14/2013)  . Asthma   . Exertional shortness of breath   . Stroke     "light stroke long years ago; didn't affect me permanently" (01/14/2013)  . Arthritis     "hands" (01/14/2013)   Past Surgical History  Procedure Laterality Date  . Tubal ligation    . Cholecystectomy    . Tonsillectomy    . Appendectomy    . Cardiovascular stress test  12/2009    no evidence for stress-induced reversibility or ischemia.  She had a fixed anterior septal wall defect, possibly related to breast attenuation and her EF was 54%.  . Cardiac catheterization  2008    no angiographically significant CAD  . Cardiac catheterization  01/14/2013  . Vaginal hysterectomy    . Dilation and curettage of uterus     History   Social History  . Marital Status: Married    Spouse Name: N/A     Number of Children: 2  . Years of Education: N/A   Occupational History  . Disability    Social History Main Topics  . Smoking status: Never Smoker   . Smokeless tobacco: Never Used  . Alcohol Use: No  . Drug Use: No  . Sexual Activity: No   Other Topics Concern  . Not on file   Social History Narrative   Lives in [place], [place].    Family History  Problem Relation Age of Onset  . Hypertension Mother    Current Outpatient Prescriptions on File Prior to Visit  Medication Sig Dispense Refill  . aspirin 81 MG tablet Take 1 tablet (81 mg total) by mouth daily.  30 tablet    . atorvastatin (LIPITOR) 20 MG tablet Take 1 tablet (20 mg total) by mouth daily.  30 tablet  5  . clopidogrel (PLAVIX) 75 MG tablet Take 1 tablet (75 mg total) by mouth daily.  30 tablet  5  . glimepiride (AMARYL) 2 MG tablet TAKE 1 TABLET IN THE MORNING BEFORE MEALS  30 tablet  0  . hydrALAZINE (APRESOLINE) 10 MG tablet TAKE 1 TABLET ONCE A DAY  30 tablet  2  . metFORMIN (GLUCOPHAGE) 500 MG tablet Take 500 mg by mouth 2 (two) times daily with a meal.       . metoprolol succinate (TOPROL-XL) 100 MG 24 hr tablet Take 1 tablet (100 mg total) by mouth daily.  30 tablet  5  .  nitroGLYCERIN (NITROSTAT) 0.4 MG SL tablet Place 1 tablet (0.4 mg total) under the tongue every 5 (five) minutes as needed. For chest pain  25 tablet  1  . omeprazole (PRILOSEC) 40 MG capsule Take 1 capsule (40 mg total) by mouth daily.  30 capsule  3  . quinapril-hydrochlorothiazide (ACCURETIC) 20-12.5 MG per tablet Take 1 tablet by mouth daily.  30 tablet  5   No current facility-administered medications on file prior to visit.   No Known Allergies Immunization History  Administered Date(s) Administered  . Influenza,inj,Quad PF,36+ Mos 12/18/2012  . Pneumococcal-Unspecified 12/18/2012   Prior to Admission medications   Medication Sig Start Date End Date Taking? Authorizing Provider  aspirin 81 MG tablet Take 1 tablet (81 mg total) by  mouth daily. 01/15/13  Yes Rhonda G Barrett, PA-C  atorvastatin (LIPITOR) 20 MG tablet Take 1 tablet (20 mg total) by mouth daily. 09/24/12  Yes Vernie Shanks, MD  clopidogrel (PLAVIX) 75 MG tablet Take 1 tablet (75 mg total) by mouth daily. 09/24/12  Yes Vernie Shanks, MD  glimepiride (AMARYL) 2 MG tablet TAKE 1 TABLET IN THE MORNING BEFORE MEALS 04/17/13  Yes Chipper Herb, MD  hydrALAZINE (APRESOLINE) 10 MG tablet TAKE 1 TABLET ONCE A DAY 03/22/13  Yes Erby Pian, FNP  metFORMIN (GLUCOPHAGE) 500 MG tablet Take 500 mg by mouth 2 (two) times daily with a meal.  01/21/13  Yes Historical Provider, MD  metoprolol succinate (TOPROL-XL) 100 MG 24 hr tablet Take 1 tablet (100 mg total) by mouth daily. 09/24/12  Yes Vernie Shanks, MD  nitroGLYCERIN (NITROSTAT) 0.4 MG SL tablet Place 1 tablet (0.4 mg total) under the tongue every 5 (five) minutes as needed. For chest pain 09/24/12  Yes Vernie Shanks, MD  omeprazole (PRILOSEC) 40 MG capsule Take 1 capsule (40 mg total) by mouth daily. 01/18/13  Yes Lysbeth Penner, FNP  quinapril-hydrochlorothiazide (ACCURETIC) 20-12.5 MG per tablet Take 1 tablet by mouth daily. 09/24/12  Yes Vernie Shanks, MD     ROS: As above in the HPI. All other systems are stable or negative.  OBJECTIVE: APPEARANCE:  Patient in no acute distress.The patient appeared well nourished and normally developed. Acyanotic. Waist: VITAL SIGNS:BP 138/80  Pulse 80  Temp(Src) 97 F (36.1 C) (Oral)  Ht _0  (1.549 m)  Wt 154 lb 12.8 oz (70.217 kg)  BMI 29.26 kg/m2 AAF overweight SKIN: warm and  Dry without overt rashes, tattoos and scars  HEAD and Neck: without JVD, Head and scalp: normal Eyes:No scleral icterus. Fundi normal, eye movements normal. Ears: Auricle normal, canal normal, Tympanic membranes normal, insufflation normal. Nose: normal Throat: normal Neck & thyroid: normal  CHEST & LUNGS: Chest wall: normal Lungs: Clear  CVS: Reveals the PMI to be normally  located. Regular rhythm, First and Second Heart sounds are normal,  absence of murmurs, rubs or gallops. Peripheral vasculature: Radial pulses: normal Dorsal pedis pulses: normal Posterior pulses: normal  ABDOMEN:  Appearance: normal Benign, no organomegaly, no masses, no Abdominal Aortic enlargement. No Guarding , no rebound. No Bruits. Bowel sounds: normal  RECTAL: N/A GU: N/A  EXTREMETIES: nonedematous. NEUROLOGIC: oriented to time,place and person; nonfocal.   ASSESSMENT: HYPERTENSION, UNSPECIFIED - Plan: CMP14+EGFR  DIABETES MELLITUS - Plan: POCT glycosylated hemoglobin (Hb A1C), CMP14+EGFR  CAD, multiple vessel  CARDIAC MURMUR  HYPERLIPIDEMIA-MIXED - Plan: Lipid panel Doing better with present regimen  PLAN: Discussed plant based  Diet. Continue the metformin twice a day.  Await the labs.  Orders Placed This Encounter  Procedures  . CMP14+EGFR  . Lipid panel  . POCT glycosylated hemoglobin (Hb A1C)   No orders of the defined types were placed in this encounter.   Medications Discontinued During This Encounter  Medication Reason  . glimepiride (AMARYL) 2 MG tablet Duplicate   Return in about 2 months (around 06/21/2013) for Recheck medical problems.  Jorja Empie P. Jacelyn Grip, M.D.

## 2013-04-24 ENCOUNTER — Other Ambulatory Visit: Payer: Self-pay | Admitting: Family Medicine

## 2013-04-24 DIAGNOSIS — E1165 Type 2 diabetes mellitus with hyperglycemia: Secondary | ICD-10-CM

## 2013-04-24 DIAGNOSIS — IMO0002 Reserved for concepts with insufficient information to code with codable children: Secondary | ICD-10-CM

## 2013-04-24 LAB — CMP14+EGFR
ALT: 13 IU/L (ref 0–32)
AST: 15 IU/L (ref 0–40)
Albumin/Globulin Ratio: 1.5 (ref 1.1–2.5)
Albumin: 4.5 g/dL (ref 3.6–4.8)
Alkaline Phosphatase: 72 IU/L (ref 39–117)
BUN/Creatinine Ratio: 12 (ref 11–26)
BUN: 14 mg/dL (ref 8–27)
CO2: 27 mmol/L (ref 18–29)
Calcium: 10.3 mg/dL (ref 8.7–10.3)
Chloride: 98 mmol/L (ref 97–108)
Creatinine, Ser: 1.18 mg/dL — ABNORMAL HIGH (ref 0.57–1.00)
GFR calc Af Amer: 56 mL/min/{1.73_m2} — ABNORMAL LOW (ref >59)
GFR calc non Af Amer: 48 mL/min/{1.73_m2} — ABNORMAL LOW (ref >59)
Globulin, Total: 3 g/dL (ref 1.5–4.5)
Glucose: 142 mg/dL — ABNORMAL HIGH (ref 65–99)
Potassium: 4.1 mmol/L (ref 3.5–5.2)
Sodium: 141 mmol/L (ref 134–144)
Total Bilirubin: 0.4 mg/dL (ref 0.0–1.2)
Total Protein: 7.5 g/dL (ref 6.0–8.5)

## 2013-04-24 LAB — LIPID PANEL
Chol/HDL Ratio: 2.7 ratio units (ref 0.0–4.4)
Cholesterol, Total: 148 mg/dL (ref 100–199)
HDL: 54 mg/dL (ref >39)
LDL Calculated: 74 mg/dL (ref 0–99)
Triglycerides: 102 mg/dL (ref 0–149)
VLDL Cholesterol Cal: 20 mg/dL (ref 5–40)

## 2013-04-24 MED ORDER — GLIMEPIRIDE 4 MG PO TABS: ORAL_TABLET | ORAL | Status: DC

## 2013-04-27 ENCOUNTER — Telehealth: Payer: Self-pay | Admitting: Family Medicine

## 2013-04-27 NOTE — Telephone Encounter (Signed)
Pt notififed and her refills have been ready since 04-24-13 and she needs to contact her pharmacy. Pt verbalized understanding

## 2013-04-27 NOTE — Telephone Encounter (Signed)
Called patient she still has some diarrhea with metformin but is manageable for now and she will continue taking it since her BG levels are improving

## 2013-05-14 ENCOUNTER — Telehealth: Payer: Self-pay | Admitting: Family Medicine

## 2013-05-17 NOTE — Telephone Encounter (Signed)
office visit to go over labs, and she should bring her sugar results.

## 2013-05-17 NOTE — Telephone Encounter (Signed)
The metformin makes her sick gives her bad diarrhea can you change it. Patient also thinks she is low and blood and thinks she needs some, because when she sticks her self she don't bleed enough

## 2013-05-18 ENCOUNTER — Other Ambulatory Visit: Payer: Self-pay | Admitting: Family Medicine

## 2013-05-20 ENCOUNTER — Other Ambulatory Visit: Payer: Self-pay

## 2013-05-20 DIAGNOSIS — I10 Essential (primary) hypertension: Secondary | ICD-10-CM

## 2013-05-20 DIAGNOSIS — E1165 Type 2 diabetes mellitus with hyperglycemia: Secondary | ICD-10-CM

## 2013-05-20 DIAGNOSIS — IMO0002 Reserved for concepts with insufficient information to code with codable children: Secondary | ICD-10-CM

## 2013-05-20 DIAGNOSIS — I2510 Atherosclerotic heart disease of native coronary artery without angina pectoris: Secondary | ICD-10-CM

## 2013-05-20 DIAGNOSIS — E785 Hyperlipidemia, unspecified: Secondary | ICD-10-CM

## 2013-05-20 MED ORDER — OMEPRAZOLE 40 MG PO CPDR: 40.0000 mg | DELAYED_RELEASE_CAPSULE | Freq: Every day | ORAL | Status: DC

## 2013-05-20 MED ORDER — METFORMIN HCL 500 MG PO TABS: 500.0000 mg | ORAL_TABLET | Freq: Two times a day (BID) | ORAL | Status: DC

## 2013-05-20 MED ORDER — GLIMEPIRIDE 4 MG PO TABS
ORAL_TABLET | ORAL | Status: DC
Start: 2013-05-20 — End: 2013-06-04

## 2013-05-20 MED ORDER — HYDRALAZINE HCL 10 MG PO TABS
10.0000 mg | ORAL_TABLET | Freq: Four times a day (QID) | ORAL | Status: DC
Start: 2013-05-20 — End: 2013-12-01

## 2013-05-20 MED ORDER — METOPROLOL SUCCINATE ER 100 MG PO TB24: 100.0000 mg | ORAL_TABLET | Freq: Every day | ORAL | Status: DC

## 2013-05-20 MED ORDER — CLOPIDOGREL BISULFATE 75 MG PO TABS
75.0000 mg | ORAL_TABLET | Freq: Every day | ORAL | Status: DC
Start: 2013-05-20 — End: 2013-12-01

## 2013-05-20 MED ORDER — ATORVASTATIN CALCIUM 20 MG PO TABS: 20.0000 mg | ORAL_TABLET | Freq: Every day | ORAL | Status: DC

## 2013-05-20 MED ORDER — GLUCOSE BLOOD VI STRP: ORAL_STRIP | Status: DC

## 2013-05-20 NOTE — Telephone Encounter (Signed)
She will call next week for a follow up appointment.

## 2013-05-21 ENCOUNTER — Telehealth: Payer: Self-pay | Admitting: Family Medicine

## 2013-05-24 ENCOUNTER — Other Ambulatory Visit: Payer: Self-pay

## 2013-05-24 ENCOUNTER — Telehealth: Payer: Self-pay | Admitting: Family Medicine

## 2013-05-24 DIAGNOSIS — I10 Essential (primary) hypertension: Secondary | ICD-10-CM

## 2013-05-24 MED ORDER — QUINAPRIL-HYDROCHLOROTHIAZIDE 20-12.5 MG PO TABS: 1.0000 | ORAL_TABLET | Freq: Every day | ORAL | Status: DC

## 2013-05-26 NOTE — Telephone Encounter (Signed)
Called in verablly

## 2013-06-02 ENCOUNTER — Other Ambulatory Visit: Payer: Self-pay | Admitting: *Deleted

## 2013-06-02 MED ORDER — BD SWAB SINGLE USE REGULAR PADS: MEDICATED_PAD | Status: DC

## 2013-06-02 NOTE — Telephone Encounter (Signed)
Last ov 2/15. 

## 2013-06-02 NOTE — Telephone Encounter (Signed)
Call patient : Prescription refilled & sent to pharmacy in [facility]. 

## 2013-06-04 ENCOUNTER — Encounter: Payer: Self-pay | Admitting: Family Medicine

## 2013-06-04 ENCOUNTER — Ambulatory Visit (INDEPENDENT_AMBULATORY_CARE_PROVIDER_SITE_OTHER): Payer: Medicare HMO | Admitting: Family Medicine

## 2013-06-04 VITALS — BP 148/71 | HR 90 | Temp 97.5°F | Ht 61.0 in | Wt 157.8 lb

## 2013-06-04 DIAGNOSIS — Z23 Encounter for immunization: Secondary | ICD-10-CM

## 2013-06-04 DIAGNOSIS — I2510 Atherosclerotic heart disease of native coronary artery without angina pectoris: Secondary | ICD-10-CM

## 2013-06-04 DIAGNOSIS — I10 Essential (primary) hypertension: Secondary | ICD-10-CM

## 2013-06-04 DIAGNOSIS — E785 Hyperlipidemia, unspecified: Secondary | ICD-10-CM

## 2013-06-04 DIAGNOSIS — E119 Type 2 diabetes mellitus without complications: Secondary | ICD-10-CM

## 2013-06-04 DIAGNOSIS — R011 Cardiac murmur, unspecified: Secondary | ICD-10-CM

## 2013-06-04 NOTE — Patient Instructions (Addendum)
Varicella-Zoster Immune Globulin (Human) injection solution What is this medicine? VARICELLA-ZOSTER IMMUNE GLOBULIN (var uh SEL uh - ZOS ter i MYOON GLOB yoo lin) helps to reduce the severity of chickenpox infections in patients who are at risk. This medicine is collected from the pooled blood of many donors. This medicine may be used for other purposes; ask your health care provider or pharmacist if you have questions. COMMON BRAND NAME(S): VARIZIG What should I tell my health care provider before I take this medicine? They need to know if you have any of these conditions: -bleeding disorder -diabetes -heart disease -high cholesterol -history of blood clots -IgA deficiency -low blood counts, like low white cell, platelet, or red cell counts -recently received or scheduled to receive a vaccine -an unusual or allergic reaction to varicella-zoster immune globulin, other immune globulins, other medicines, foods, dyes, or preservatives -pregnant or trying to get pregnant -breast-feeding How should I use this medicine? This medicine is for injection into a muscle. It is usually given by a health care professional in a hospital or clinic setting. Talk to your pediatrician regarding the use of this medicine in children. While this drug may be prescribed for newborns for selected conditions, precautions do apply. Overdosage: If you think you've taken too much of this medicine contact a poison control center or emergency room at once. Overdosage: If you think you have taken too much of this medicine contact a poison control center or emergency room at once. NOTE: This medicine is only for you. Do not share this medicine with others. What if I miss a dose? This does not apply. What may interact with this medicine? -live virus vaccines This list may not describe all possible interactions. Give your health care provider a list of all the medicines, herbs, non-prescription drugs, or dietary  supplements you use. Also tell them if you smoke, drink alcohol, or use illegal drugs. Some items may interact with your medicine. What should I watch for while using this medicine? This medicine is made from human blood. It may be possible to pass an infection in this medicine, but no cases have been reported. Talk to your doctor about the risks and benefits of this medicine. This medicine can decrease the response to a vaccine. If you need to get vaccinated, tell your healthcare professional if you have received this medicine within the last 3 months. Extra booster doses may be needed. Talk to your doctor to see if a different vaccination schedule is needed. Your condition will be monitored carefully while you are receiving this medicine. What side effects may I notice from receiving this medicine? Side effects that you should report to your doctor or health care professional as soon as possible: -allergic reactions like skin rash, itching or hives, swelling of the face, lips, or tongue -shortness of breath, chest pain, swelling in a leg  Side effects that usually do not require medical attention (Report these to your doctor or health care professional if they continue or are bothersome.): -chills -headache -pain, redness, or irritation at site where injected This list may not describe all possible side effects. Call your doctor for medical advice about side effects. You may report side effects to FDA at 1-800-FDA-1088. Where should I keep my medicine? This drug is given in a hospital or clinic and will not be stored at home. NOTE: This sheet is a summary. It may not cover all possible information. If you have questions about this medicine, talk to your doctor, pharmacist, or  health care provider.  2014, Elsevier/Gold Standard. (2012-02-18 16:38:51)   Metformin:: Half tablet of the metformin with breakfast; half tablet with lunch; one tablet with supper.

## 2013-06-04 NOTE — Progress Notes (Signed)
TOLERATED ZOSTERVAX INJECTION WITHOUT DIFFICULTY 

## 2013-06-04 NOTE — Progress Notes (Signed)
Patient ID: Tonya Carpenter, female   DOB: 02-24-1946, 68 y.o.   MRN: [id number] SUBJECTIVE: CC: Chief Complaint  Patient presents with  . Follow-up    reck diabetes    HPI:  1)Patient is here for follow up of Diabetes Mellitus. She heard that Dr Jacelyn Grip is leaving soon so she came to review her DM before his departure instead of keeping her follow up with me next month.: Symptoms evaluated: Denies Nocturia ,Denies Urinary Frequency , denies Blurred vision ,deniesDizziness,denies.Dysuria,denies paresthesias, denies extremity pain or ulcers.Marland Kitchendenies chest pain. has had an annual eye exam. do check the feet. Does check CBGs. Average CBG:65- 165 Denies episodes of hypoglycemia.though she has 2 readings in the evening at 65. The rest were mainly at 120s to 140s. Does have an emergency hypoglycemic plan. admits toCompliance with medications. Problems with medications.diarrhea with the metformin in the morning.  2) Patient is here for follow up of hypertension: denies Headache;deniesChest Pain;denies weakness;denies Shortness of Breath or Orthopnea;denies Visual changes;denies palpitations;denies cough;denies pedal edema;denies symptoms of TIA or stroke; admits to Compliance with medications. denies Problems with anti-HTN medications.   Past Medical History  Diagnosis Date  . Hypertension   . Type 2 diabetes mellitus   . Hyperlipidemia   . Obesity   . Anxiety   . Coronary artery disease   . Myocardial infarction     "several years back" (01/14/2013)  . Asthma   . Exertional shortness of breath   . Stroke     "light stroke long years ago; didn't affect me permanently" (01/14/2013)  . Arthritis     "hands" (01/14/2013)   Past Surgical History  Procedure Laterality Date  . Tubal ligation    . Cholecystectomy    . Tonsillectomy    . Appendectomy    . Cardiovascular stress test  12/2009    no evidence for stress-induced reversibility or ischemia.  She had a fixed anterior septal wall  defect, possibly related to breast attenuation and her EF was 54%.  . Cardiac catheterization  2008    no angiographically significant CAD  . Cardiac catheterization  01/14/2013  . Vaginal hysterectomy    . Dilation and curettage of uterus     History   Social History  . Marital Status: Married    Spouse Name: N/A    Number of Children: 2  . Years of Education: N/A   Occupational History  . Disability    Social History Main Topics  . Smoking status: Never Smoker   . Smokeless tobacco: Never Used  . Alcohol Use: No  . Drug Use: No  . Sexual Activity: No   Other Topics Concern  . Not on file   Social History Narrative   Lives in [place], [place].    Family History  Problem Relation Age of Onset  . Hypertension Mother    Current Outpatient Prescriptions on File Prior to Visit  Medication Sig Dispense Refill  . Alcohol Swabs (B-D SINGLE USE SWABS REGULAR) PADS Pt test bid, dx 250.0  200 each  0  . aspirin 81 MG tablet Take 1 tablet (81 mg total) by mouth daily.  30 tablet    . atorvastatin (LIPITOR) 20 MG tablet Take 1 tablet (20 mg total) by mouth daily.  90 tablet  1  . clopidogrel (PLAVIX) 75 MG tablet Take 1 tablet (75 mg total) by mouth daily.  90 tablet  1  . glucose blood test strip Use as instructed  300 each  0  .  hydrALAZINE (APRESOLINE) 10 MG tablet Take 1 tablet (10 mg total) by mouth 4 (four) times daily.  90 tablet  1  . metoprolol succinate (TOPROL-XL) 100 MG 24 hr tablet Take 1 tablet (100 mg total) by mouth daily.  90 tablet  1  . nitroGLYCERIN (NITROSTAT) 0.4 MG SL tablet Place 1 tablet (0.4 mg total) under the tongue every 5 (five) minutes as needed. For chest pain  25 tablet  1  . omeprazole (PRILOSEC) 40 MG capsule Take 1 capsule (40 mg total) by mouth daily.  90 capsule  1  . quinapril-hydrochlorothiazide (ACCURETIC) 20-12.5 MG per tablet Take 1 tablet by mouth daily.  90 tablet  1   No current facility-administered medications on file prior to visit.    No Known Allergies Immunization History  Administered Date(s) Administered  . Influenza,inj,Quad PF,36+ Mos 12/18/2012  . Pneumococcal-Unspecified 12/18/2012  . Zoster 06/04/2013   Prior to Admission medications   Medication Sig Start Date End Date Taking? Authorizing Provider  Alcohol Swabs (B-D SINGLE USE SWABS REGULAR) PADS Pt test bid, dx 250.0 06/02/13  Yes Vernie Shanks, MD  aspirin 81 MG tablet Take 1 tablet (81 mg total) by mouth daily. 01/15/13  Yes Rhonda G Barrett, PA-C  atorvastatin (LIPITOR) 20 MG tablet Take 1 tablet (20 mg total) by mouth daily. 05/20/13  Yes Chipper Herb, MD  Blood Glucose Monitoring Suppl (PRODIGY AUTOCODE BLOOD GLUCOSE) W/DEVICE KIT  05/27/13  Yes Historical Provider, MD  clopidogrel (PLAVIX) 75 MG tablet Take 1 tablet (75 mg total) by mouth daily. 05/20/13  Yes Chipper Herb, MD  glimepiride (AMARYL) 4 MG tablet TAKE 1 TABLET IN THE MORNING BEFORE MEALS 05/20/13  Yes Chipper Herb, MD  glucose blood test strip Use as instructed 05/20/13  Yes Chipper Herb, MD  hydrALAZINE (APRESOLINE) 10 MG tablet Take 1 tablet (10 mg total) by mouth 4 (four) times daily. 05/20/13  Yes Chipper Herb, MD  metFORMIN (GLUCOPHAGE) 500 MG tablet Take 1 tablet (500 mg total) by mouth 2 (two) times daily with a meal. 05/20/13  Yes Chipper Herb, MD  metoprolol succinate (TOPROL-XL) 100 MG 24 hr tablet Take 1 tablet (100 mg total) by mouth daily. 05/20/13  Yes Chipper Herb, MD  nitroGLYCERIN (NITROSTAT) 0.4 MG SL tablet Place 1 tablet (0.4 mg total) under the tongue every 5 (five) minutes as needed. For chest pain 09/24/12  Yes Vernie Shanks, MD  omeprazole (PRILOSEC) 40 MG capsule Take 1 capsule (40 mg total) by mouth daily. 05/20/13  Yes Chipper Herb, MD  quinapril-hydrochlorothiazide (ACCURETIC) 20-12.5 MG per tablet Take 1 tablet by mouth daily. 05/24/13  Yes Vernie Shanks, MD  PRODIGY TWIST TOP LANCETS 28G [place]  05/27/13   Historical Provider, MD     ROS: As above in the  HPI. All other systems are stable or negative.  OBJECTIVE: APPEARANCE:  Patient in no acute distress.The patient appeared well nourished and normally developed. Acyanotic. Waist: VITAL SIGNS:BP 148/71  Pulse 90  Temp(Src) 97.5 F (36.4 C) (Oral)  Ht _0  (1.549 m)  Wt 157 lb 12.8 oz (71.578 kg)  BMI 29.83 kg/m2 AAF  SKIN: warm and  Dry without overt rashes, tattoos and scars  HEAD and Neck: without JVD, Head and scalp: normal Eyes:No scleral icterus. Fundi normal, eye movements normal. Ears: Auricle normal, canal normal, Tympanic membranes normal, insufflation normal. Nose: normal Throat: normal Neck & thyroid: normal  CHEST & LUNGS: Chest wall: normal  Lungs: Clear  CVS: Reveals the PMI to be normally located. Regular rhythm, First and Second Heart sounds are normal,  absence of murmurs, rubs or gallops. Peripheral vasculature: Radial pulses: normal Dorsal pedis pulses: normal Posterior pulses: normal  ABDOMEN:  Appearance: normal Benign, no organomegaly, no masses, no Abdominal Aortic enlargement. No Guarding , no rebound. No Bruits. Bowel sounds: normal  RECTAL: N/A GU: N/A  EXTREMETIES: nonedematous.  MUSCULOSKELETAL:  Spine: normal Joints: intact  NEUROLOGIC: oriented to time,place and person; nonfocal.  Results for orders placed in visit on 04/23/13  CMP14+EGFR      Result Value Ref Range   Glucose 142 (*) 65 - 99 mg/dL   BUN 14  8 - 27 mg/dL   Creatinine, Ser 1.18 (*) 0.57 - 1.00 mg/dL   GFR calc non Af Amer 48 (*) >59 mL/min/1.73   GFR calc Af Amer 56 (*) >59 mL/min/1.73   BUN/Creatinine Ratio 12  11 - 26   Sodium 141  134 - 144 mmol/L   Potassium 4.1  3.5 - 5.2 mmol/L   Chloride 98  97 - 108 mmol/L   CO2 27  18 - 29 mmol/L   Calcium 10.3  8.7 - 10.3 mg/dL   Total Protein 7.5  6.0 - 8.5 g/dL   Albumin 4.5  3.6 - 4.8 g/dL   Globulin, Total 3.0  1.5 - 4.5 g/dL   Albumin/Globulin Ratio 1.5  1.1 - 2.5   Total Bilirubin 0.4  0.0 - 1.2 mg/dL    Alkaline Phosphatase 72  39 - 117 IU/L   AST 15  0 - 40 IU/L   ALT 13  0 - 32 IU/L  LIPID PANEL      Result Value Ref Range   Cholesterol, Total 148  100 - 199 mg/dL   Triglycerides 102  0 - 149 mg/dL   HDL 54  >39 mg/dL   VLDL Cholesterol Cal 20  5 - 40 mg/dL   LDL Calculated 74  0 - 99 mg/dL   Chol/HDL Ratio 2.7  0.0 - 4.4 ratio units  POCT GLYCOSYLATED HEMOGLOBIN (HGB A1C)      Result Value Ref Range   Hemoglobin A1C 8.5      ASSESSMENT:  DIABETES MELLITUS  HYPERTENSION, UNSPECIFIED  HYPERLIPIDEMIA-MIXED  CAD, multiple vessel  CARDIAC MURMUR  Need for zoster vaccination - Plan: Varicella-zoster vaccine subcutaneous Diarrhea with metformin.   PLAN: Will adjust the metformin to reduce the diarrhea.  Orders Placed This Encounter  Procedures  . Varicella-zoster vaccine subcutaneous   Meds ordered this encounter  Medications  . Blood Glucose Monitoring Suppl (PRODIGY AUTOCODE BLOOD GLUCOSE) W/DEVICE KIT    Sig:   . PRODIGY TWIST TOP LANCETS 28G MISC    Sig:   . glimepiride (AMARYL) 4 MG tablet    Sig: Take 8 mg by mouth daily with breakfast. TAKE 1 TABLET IN THE MORNING BEFORE MEALS  . metFORMIN (GLUCOPHAGE) 500 MG tablet    Sig: Half tablet with Breakfast, half tablet with lunch, and a whole tablet with supper.    Dispense:  180 tablet    Refill:  0   Medications Discontinued During This Encounter  Medication Reason  . glimepiride (AMARYL) 4 MG tablet   . metFORMIN (GLUCOPHAGE) 500 MG tablet Reorder   Return for keep the appointment next month.continue to monitor the blood sugars. Monitor the BP.  Alzena Gerber P. Jacelyn Grip, M.D.

## 2013-06-22 ENCOUNTER — Ambulatory Visit (INDEPENDENT_AMBULATORY_CARE_PROVIDER_SITE_OTHER): Payer: Medicare HMO | Admitting: Family Medicine

## 2013-06-22 ENCOUNTER — Encounter: Payer: Self-pay | Admitting: Family Medicine

## 2013-06-22 VITALS — BP 156/74 | HR 52 | Temp 97.7°F | Ht 61.0 in | Wt 156.2 lb

## 2013-06-22 DIAGNOSIS — I2510 Atherosclerotic heart disease of native coronary artery without angina pectoris: Secondary | ICD-10-CM

## 2013-06-22 DIAGNOSIS — E785 Hyperlipidemia, unspecified: Secondary | ICD-10-CM

## 2013-06-22 DIAGNOSIS — I10 Essential (primary) hypertension: Secondary | ICD-10-CM

## 2013-06-22 DIAGNOSIS — R011 Cardiac murmur, unspecified: Secondary | ICD-10-CM

## 2013-06-22 DIAGNOSIS — E119 Type 2 diabetes mellitus without complications: Secondary | ICD-10-CM

## 2013-06-22 NOTE — Progress Notes (Signed)
Patient ID: Tonya Carpenter, female   DOB: 07-16-1945, 68 y.o.   MRN: [id number] SUBJECTIVE: CC: Chief Complaint  Patient presents with  . Follow-up    1 month follow up chronic problems     HPI:  Patient is here for follow up of Diabetes Mellitus/HTN/HLD: Symptoms evaluated: Denies Nocturia ,Denies Urinary Frequency , denies Blurred vision ,deniesDizziness,denies.Dysuria,denies paresthesias, denies extremity pain or ulcers.Marland Kitchendenies chest pain. has had an annual eye exam. do check the feet. Does check CBGs. Average CBG:130s to 140s. Was getting episodes of hypoglycemia.but better with the changes of her regimen. Does have an emergency hypoglycemic plan. admits toCompliance with medications. Denies Problems with medications.  Past Medical History  Diagnosis Date  . Hypertension   . Type 2 diabetes mellitus   . Hyperlipidemia   . Obesity   . Anxiety   . Coronary artery disease   . Myocardial infarction     "several years back" (01/14/2013)  . Asthma   . Exertional shortness of breath   . Stroke     "light stroke long years ago; didn't affect me permanently" (01/14/2013)  . Arthritis     "hands" (01/14/2013)   Past Surgical History  Procedure Laterality Date  . Tubal ligation    . Cholecystectomy    . Tonsillectomy    . Appendectomy    . Cardiovascular stress test  12/2009    no evidence for stress-induced reversibility or ischemia.  She had a fixed anterior septal wall defect, possibly related to breast attenuation and her EF was 54%.  . Cardiac catheterization  2008    no angiographically significant CAD  . Cardiac catheterization  01/14/2013  . Vaginal hysterectomy    . Dilation and curettage of uterus     History   Social History  . Marital Status: Married    Spouse Name: N/A    Number of Children: 2  . Years of Education: N/A   Occupational History  . Disability    Social History Main Topics  . Smoking status: Never Smoker   . Smokeless tobacco: Never Used   . Alcohol Use: No  . Drug Use: No  . Sexual Activity: No   Other Topics Concern  . Not on file   Social History Narrative   Lives in [place], [place].    Family History  Problem Relation Age of Onset  . Hypertension Mother    Current Outpatient Prescriptions on File Prior to Visit  Medication Sig Dispense Refill  . Alcohol Swabs (B-D SINGLE USE SWABS REGULAR) PADS Pt test bid, dx 250.0  200 each  0  . aspirin 81 MG tablet Take 1 tablet (81 mg total) by mouth daily.  30 tablet    . atorvastatin (LIPITOR) 20 MG tablet Take 1 tablet (20 mg total) by mouth daily.  90 tablet  1  . Blood Glucose Monitoring Suppl (PRODIGY AUTOCODE BLOOD GLUCOSE) W/DEVICE KIT       . clopidogrel (PLAVIX) 75 MG tablet Take 1 tablet (75 mg total) by mouth daily.  90 tablet  1  . glimepiride (AMARYL) 4 MG tablet Take 8 mg by mouth daily with breakfast. TAKE 1 TABLET IN THE MORNING BEFORE MEALS      . glucose blood test strip Use as instructed  300 each  0  . hydrALAZINE (APRESOLINE) 10 MG tablet Take 1 tablet (10 mg total) by mouth 4 (four) times daily.  90 tablet  1  . metFORMIN (GLUCOPHAGE) 500 MG tablet Half tablet with Breakfast,  half tablet with lunch, and a whole tablet with supper.  180 tablet  0  . metoprolol succinate (TOPROL-XL) 100 MG 24 hr tablet Take 1 tablet (100 mg total) by mouth daily.  90 tablet  1  . nitroGLYCERIN (NITROSTAT) 0.4 MG SL tablet Place 1 tablet (0.4 mg total) under the tongue every 5 (five) minutes as needed. For chest pain  25 tablet  1  . omeprazole (PRILOSEC) 40 MG capsule Take 1 capsule (40 mg total) by mouth daily.  90 capsule  1  . PRODIGY TWIST TOP LANCETS 28G MISC       . quinapril-hydrochlorothiazide (ACCURETIC) 20-12.5 MG per tablet Take 1 tablet by mouth daily.  90 tablet  1   No current facility-administered medications on file prior to visit.   No Known Allergies Immunization History  Administered Date(s) Administered  . Influenza,inj,Quad PF,36+ Mos 12/18/2012  .  Pneumococcal-Unspecified 12/18/2012  . Zoster 06/04/2013   Prior to Admission medications   Medication Sig Start Date End Date Taking? Authorizing Provider  Alcohol Swabs (B-D SINGLE USE SWABS REGULAR) PADS Pt test bid, dx 250.0 06/02/13  Yes Vernie Shanks, MD  aspirin 81 MG tablet Take 1 tablet (81 mg total) by mouth daily. 01/15/13  Yes Rhonda G Barrett, PA-C  atorvastatin (LIPITOR) 20 MG tablet Take 1 tablet (20 mg total) by mouth daily. 05/20/13  Yes Chipper Herb, MD  Blood Glucose Monitoring Suppl (PRODIGY AUTOCODE BLOOD GLUCOSE) W/DEVICE KIT  05/27/13  Yes Historical Provider, MD  clopidogrel (PLAVIX) 75 MG tablet Take 1 tablet (75 mg total) by mouth daily. 05/20/13  Yes Chipper Herb, MD  glimepiride (AMARYL) 4 MG tablet Take 8 mg by mouth daily with breakfast. TAKE 1 TABLET IN THE MORNING BEFORE MEALS 05/20/13  Yes Chipper Herb, MD  glucose blood test strip Use as instructed 05/20/13  Yes Chipper Herb, MD  hydrALAZINE (APRESOLINE) 10 MG tablet Take 1 tablet (10 mg total) by mouth 4 (four) times daily. 05/20/13  Yes Chipper Herb, MD  metFORMIN (GLUCOPHAGE) 500 MG tablet Half tablet with Breakfast, half tablet with lunch, and a whole tablet with supper. 06/04/13  Yes Vernie Shanks, MD  metoprolol succinate (TOPROL-XL) 100 MG 24 hr tablet Take 1 tablet (100 mg total) by mouth daily. 05/20/13  Yes Chipper Herb, MD  nitroGLYCERIN (NITROSTAT) 0.4 MG SL tablet Place 1 tablet (0.4 mg total) under the tongue every 5 (five) minutes as needed. For chest pain 09/24/12  Yes Vernie Shanks, MD  omeprazole (PRILOSEC) 40 MG capsule Take 1 capsule (40 mg total) by mouth daily. 05/20/13  Yes Chipper Herb, MD  PRODIGY TWIST TOP LANCETS 28G [place]  05/27/13  Yes Historical Provider, MD  quinapril-hydrochlorothiazide (ACCURETIC) 20-12.5 MG per tablet Take 1 tablet by mouth daily. 05/24/13  Yes Vernie Shanks, MD     ROS: As above in the HPI. All other systems are stable or negative.  OBJECTIVE: APPEARANCE:   Patient in no acute distress.The patient appeared well nourished and normally developed. Acyanotic. Waist: VITAL SIGNS:BP 156/74  Pulse 52  Temp(Src) 97.7 F (36.5 C) (Oral)  Ht 5' 1"  (1.549 m)  Wt 156 lb 3.2 oz (70.852 kg)  BMI 29.53 kg/m2 AAF  SKIN: warm and  Dry without overt rashes, tattoos and scars  HEAD and Neck: without JVD, Head and scalp: normal Eyes:No scleral icterus. Fundi normal, eye movements normal. Ears: Auricle normal, canal normal, Tympanic membranes normal, insufflation normal. Nose: normal  Throat: normal Neck & thyroid: normal  CHEST & LUNGS: Chest wall: normal Lungs: Clear  CVS: Reveals the PMI to be normally located. Regular rhythm, First and Second Heart sounds are normal,  absence of murmurs, rubs or gallops. Peripheral vasculature: Radial pulses: normal Dorsal pedis pulses: normal Posterior pulses: normal  ABDOMEN:  Appearance: overweight Benign, no organomegaly, no masses, no Abdominal Aortic enlargement. No Guarding , no rebound. No Bruits. Bowel sounds: normal  RECTAL: N/A GU: N/A  EXTREMETIES: nonedematous.  MUSCULOSKELETAL:  Spine: normal Joints: intact  NEUROLOGIC: oriented to time,place and person; nonfocal. Strength is normal Sensory is normal Reflexes are normal Cranial Nerves are normal. Results for orders placed in visit on 04/23/13  CMP14+EGFR      Result Value Ref Range   Glucose 142 (*) 65 - 99 mg/dL   BUN 14  8 - 27 mg/dL   Creatinine, Ser 1.18 (*) 0.57 - 1.00 mg/dL   GFR calc non Af Amer 48 (*) >59 mL/min/1.73   GFR calc Af Amer 56 (*) >59 mL/min/1.73   BUN/Creatinine Ratio 12  11 - 26   Sodium 141  134 - 144 mmol/L   Potassium 4.1  3.5 - 5.2 mmol/L   Chloride 98  97 - 108 mmol/L   CO2 27  18 - 29 mmol/L   Calcium 10.3  8.7 - 10.3 mg/dL   Total Protein 7.5  6.0 - 8.5 g/dL   Albumin 4.5  3.6 - 4.8 g/dL   Globulin, Total 3.0  1.5 - 4.5 g/dL   Albumin/Globulin Ratio 1.5  1.1 - 2.5   Total Bilirubin 0.4   0.0 - 1.2 mg/dL   Alkaline Phosphatase 72  39 - 117 IU/L   AST 15  0 - 40 IU/L   ALT 13  0 - 32 IU/L  LIPID PANEL      Result Value Ref Range   Cholesterol, Total 148  100 - 199 mg/dL   Triglycerides 102  0 - 149 mg/dL   HDL 54  >39 mg/dL   VLDL Cholesterol Cal 20  5 - 40 mg/dL   LDL Calculated 74  0 - 99 mg/dL   Chol/HDL Ratio 2.7  0.0 - 4.4 ratio units  POCT GLYCOSYLATED HEMOGLOBIN (HGB A1C)      Result Value Ref Range   Hemoglobin A1C 8.5      ASSESSMENT:  HYPERTENSION, UNSPECIFIED  HYPERLIPIDEMIA-MIXED  DIABETES MELLITUS  CAD, multiple vessel  CARDIAC MURMUR Reviewed her CBGs. Fasting in morning has been 130s and pre dinner has been 110.   PLAN: Continue same medication regimen.  Will not change because CV event risks higher with hypoglycemia than the risk of her HGBA1C not being at goal.  No orders of the defined types were placed in this encounter.   No orders of the defined types were placed in this encounter.   There are no discontinued medications. Return in about 4 weeks (around 07/20/2013) for Recheck medical problems. and labs at that time.  Jewelene Mairena P. Jacelyn Grip, M.D.

## 2013-06-29 ENCOUNTER — Telehealth: Payer: Self-pay | Admitting: Family Medicine

## 2013-06-29 NOTE — Telephone Encounter (Signed)
Almira CoasterGina I called pt and she said Dr. Modesto CharonWong told them to come for visit at same time. There are no appts available. Call pt and let her know.

## 2013-06-29 NOTE — Telephone Encounter (Signed)
Pt  Notified and appts made for same day

## 2013-07-03 ENCOUNTER — Ambulatory Visit (INDEPENDENT_AMBULATORY_CARE_PROVIDER_SITE_OTHER): Payer: Medicare HMO | Admitting: Family Medicine

## 2013-07-03 VITALS — BP 147/84 | HR 75 | Temp 96.8°F | Ht 61.0 in | Wt 150.0 lb

## 2013-07-03 DIAGNOSIS — M25519 Pain in unspecified shoulder: Secondary | ICD-10-CM

## 2013-07-03 DIAGNOSIS — M25511 Pain in right shoulder: Secondary | ICD-10-CM

## 2013-07-03 DIAGNOSIS — IMO0002 Reserved for concepts with insufficient information to code with codable children: Secondary | ICD-10-CM

## 2013-07-03 DIAGNOSIS — R7989 Other specified abnormal findings of blood chemistry: Secondary | ICD-10-CM

## 2013-07-03 DIAGNOSIS — E1165 Type 2 diabetes mellitus with hyperglycemia: Secondary | ICD-10-CM

## 2013-07-03 DIAGNOSIS — IMO0001 Reserved for inherently not codable concepts without codable children: Secondary | ICD-10-CM

## 2013-07-03 DIAGNOSIS — R799 Abnormal finding of blood chemistry, unspecified: Secondary | ICD-10-CM

## 2013-07-03 NOTE — Addendum Note (Signed)
Addended by: Bernita BuffyANDERSON, Idali Lafever G on: 07/03/2013 10:51 AM   Modules accepted: Orders

## 2013-07-03 NOTE — Progress Notes (Signed)
   Subjective:    Patient ID: Tonya Carpenter, female    DOB: 09/23/1945, 68 y.o.   MRN: [id number]  HPI  Patient comes in today stating that she was picking up her vacuum this morning and felt something pop in her right shoulder and is now having pain. The patient has a history of a slightly elevated creatinine. She also indicates that she fell on her shoulder years ago and injured it. She indicates it is not tender to touch but with motion that it is painful in the posterior shoulder.  Review of Systems  Constitutional: Negative.   HENT: Negative.   Eyes: Negative.   Respiratory: Negative.   Cardiovascular: Negative.   Gastrointestinal: Negative.   Endocrine: Negative.   Genitourinary: Negative.   Musculoskeletal: Positive for myalgias. Negative for arthralgias, back pain, gait problem, joint swelling, neck pain and neck stiffness.  Skin: Negative.   Allergic/Immunologic: Negative.   Neurological: Negative.   Hematological: Negative.   Psychiatric/Behavioral: Negative.        Objective:   Physical Exam  Nursing note and vitals reviewed. Constitutional: She is oriented to person, place, and time. She appears well-developed and well-nourished. No distress.  HENT:  Head: Normocephalic and atraumatic.  Eyes: Conjunctivae and EOM are normal. Pupils are equal, round, and reactive to light. Right eye exhibits no discharge. Left eye exhibits no discharge. No scleral icterus.  Neck: Normal range of motion. Neck supple. No thyromegaly present.  Cardiovascular: Regular rhythm.   Abdominal: Soft. Bowel sounds are normal. She exhibits no mass.  Musculoskeletal: Normal range of motion. She exhibits no edema and no tenderness.  Limited range of motion of right arm and shoulder especially with abduction, no specific point tenderness, no a.c. joint tenderness no suprascapular tenderness no anterior shoulder tenderness  Neurological: She is alert and oriented to person, place, and time.  Skin: Skin  is warm and dry. No rash noted.  Psychiatric: She has a normal mood and affect. Her behavior is normal. Judgment and thought content normal.      BP 147/84  Pulse 75  Temp(Src) 96.8 F (36 C) (Oral)  Ht 5\' 1"  (1.549 m)  Wt 150 lb (68.04 kg)  BMI 28.36 kg/m2     Assessment & Plan:  1. Right shoulder pain -Return to clinic Monday for x-rays of right shoulder  2. Elevated serum creatinine  3. Diabetes mellitus type 2, uncontrolled Patient Instructions  Take Tylenol 4 times daily if needed for pain Use ice cold compresses off and on for 20 minutes 4 times daily for 2 days After 2 days, use warm wet compresses 20 minutes 4 times daily Return to clinic on Monday for an x-ray of your shoulder You can purchase ortho Gel over-the-counter and this may be applied to the shoulder 4 times daily   Nyra Capeson W. Jenalyn Girdner MD

## 2013-07-03 NOTE — Patient Instructions (Addendum)
Take Tylenol 4 times daily if needed for pain Use ice cold compresses off and on for 20 minutes 4 times daily for 2 days After 2 days, use warm wet compresses 20 minutes 4 times daily Return to clinic on Monday for an x-ray of your shoulder You can purchase ortho Gel over-the-counter and this may be applied to the shoulder 4 times daily

## 2013-07-05 ENCOUNTER — Ambulatory Visit (INDEPENDENT_AMBULATORY_CARE_PROVIDER_SITE_OTHER): Payer: Medicare HMO

## 2013-07-05 ENCOUNTER — Other Ambulatory Visit: Payer: Medicare HMO

## 2013-07-05 DIAGNOSIS — M25511 Pain in right shoulder: Secondary | ICD-10-CM

## 2013-07-05 DIAGNOSIS — M25519 Pain in unspecified shoulder: Secondary | ICD-10-CM

## 2013-07-19 ENCOUNTER — Ambulatory Visit (INDEPENDENT_AMBULATORY_CARE_PROVIDER_SITE_OTHER): Payer: Medicare HMO | Admitting: Family Medicine

## 2013-07-19 ENCOUNTER — Encounter: Payer: Self-pay | Admitting: Family Medicine

## 2013-07-19 VITALS — BP 155/67 | HR 57 | Temp 97.0°F | Ht 61.0 in | Wt 159.4 lb

## 2013-07-19 DIAGNOSIS — E785 Hyperlipidemia, unspecified: Secondary | ICD-10-CM

## 2013-07-19 DIAGNOSIS — I10 Essential (primary) hypertension: Secondary | ICD-10-CM

## 2013-07-19 DIAGNOSIS — I2510 Atherosclerotic heart disease of native coronary artery without angina pectoris: Secondary | ICD-10-CM

## 2013-07-19 DIAGNOSIS — Z23 Encounter for immunization: Secondary | ICD-10-CM

## 2013-07-19 DIAGNOSIS — R011 Cardiac murmur, unspecified: Secondary | ICD-10-CM

## 2013-07-19 DIAGNOSIS — E119 Type 2 diabetes mellitus without complications: Secondary | ICD-10-CM

## 2013-07-19 LAB — POCT GLYCOSYLATED HEMOGLOBIN (HGB A1C): Hemoglobin A1C: 7.3

## 2013-07-19 NOTE — Progress Notes (Signed)
Patient ID: Tonya Carpenter, female   DOB: [birth year], 68 y.o.   MRN: [id number] SUBJECTIVE: CC: Chief Complaint  Patient presents with  . Diabetes    3 month recheck  . Hypertension  . Hyperlipidemia    HPI: Patient is here for follow up of Diabetes Mellitus/HTN/HLD: Symptoms evaluated: Denies Nocturia ,Denies Urinary Frequency , denies Blurred vision ,deniesDizziness,denies.Dysuria,denies paresthesias, denies extremity pain or ulcers.Marland Kitchendenies chest pain. has had an annual eye exam. do check the feet. Does check CBGs. Average CBG:100s-130s Denies episodes of hypoglycemia. Does have an emergency hypoglycemic plan. admits toCompliance with medications. Denies Problems with medications.Taking metformin 1 tab twice a day without problems.  Past Medical History  Diagnosis Date  . Hypertension   . Type 2 diabetes mellitus   . Hyperlipidemia   . Obesity   . Anxiety   . Coronary artery disease   . Myocardial infarction     "several years back" (01/14/2013)  . Asthma   . Exertional shortness of breath   . Stroke     "light stroke long years ago; didn't affect me permanently" (01/14/2013)  . Arthritis     "hands" (01/14/2013)   Past Surgical History  Procedure Laterality Date  . Tubal ligation    . Cholecystectomy    . Tonsillectomy    . Appendectomy    . Cardiovascular stress test  12/2009    no evidence for stress-induced reversibility or ischemia.  She had a fixed anterior septal wall defect, possibly related to breast attenuation and her EF was 54%.  . Cardiac catheterization  2008    no angiographically significant CAD  . Cardiac catheterization  01/14/2013  . Vaginal hysterectomy    . Dilation and curettage of uterus     History   Social History  . Marital Status: Married    Spouse Name: N/A    Number of Children: 2  . Years of Education: N/A   Occupational History  . Disability    Social History Main Topics  . Smoking status: Never Smoker   . Smokeless  tobacco: Never Used  . Alcohol Use: No  . Drug Use: No  . Sexual Activity: No   Other Topics Concern  . Not on file   Social History Narrative   Lives in [place], [place].    Family History  Problem Relation Age of Onset  . Hypertension Mother    Current Outpatient Prescriptions on File Prior to Visit  Medication Sig Dispense Refill  . aspirin 81 MG tablet Take 1 tablet (81 mg total) by mouth daily.  30 tablet    . atorvastatin (LIPITOR) 20 MG tablet Take 1 tablet (20 mg total) by mouth daily.  90 tablet  1  . Blood Glucose Monitoring Suppl (PRODIGY AUTOCODE BLOOD GLUCOSE) W/DEVICE KIT       . clopidogrel (PLAVIX) 75 MG tablet Take 1 tablet (75 mg total) by mouth daily.  90 tablet  1  . glimepiride (AMARYL) 4 MG tablet Take 8 mg by mouth daily with breakfast. TAKE 1 TABLET IN THE MORNING BEFORE MEALS      . glucose blood test strip Use as instructed  300 each  0  . hydrALAZINE (APRESOLINE) 10 MG tablet Take 1 tablet (10 mg total) by mouth 4 (four) times daily.  90 tablet  1  . metFORMIN (GLUCOPHAGE) 500 MG tablet Half tablet with Breakfast, half tablet with lunch, and a whole tablet with supper.  180 tablet  0  . metoprolol succinate (TOPROL-XL) 100  MG 24 hr tablet Take 1 tablet (100 mg total) by mouth daily.  90 tablet  1  . nitroGLYCERIN (NITROSTAT) 0.4 MG SL tablet Place 1 tablet (0.4 mg total) under the tongue every 5 (five) minutes as needed. For chest pain  25 tablet  1  . omeprazole (PRILOSEC) 40 MG capsule Take 1 capsule (40 mg total) by mouth daily.  90 capsule  1  . PRODIGY TWIST TOP LANCETS 28G MISC       . quinapril-hydrochlorothiazide (ACCURETIC) 20-12.5 MG per tablet Take 1 tablet by mouth daily.  90 tablet  1  . Alcohol Swabs (B-D SINGLE USE SWABS REGULAR) PADS Pt test bid, dx 250.0  200 each  0   No current facility-administered medications on file prior to visit.   No Known Allergies Immunization History  Administered Date(s) Administered  . Influenza,inj,Quad PF,36+  Mos 12/18/2012  . Pneumococcal-Unspecified 12/18/2012  . Tdap 07/19/2013  . Zoster 06/04/2013   Prior to Admission medications   Medication Sig Start Date End Date Taking? Authorizing Provider  aspirin 81 MG tablet Take 1 tablet (81 mg total) by mouth daily. 01/15/13  Yes Rhonda G Barrett, PA-C  atorvastatin (LIPITOR) 20 MG tablet Take 1 tablet (20 mg total) by mouth daily. 05/20/13  Yes Chipper Herb, MD  Blood Glucose Monitoring Suppl (PRODIGY AUTOCODE BLOOD GLUCOSE) W/DEVICE KIT  05/27/13  Yes Historical Provider, MD  clopidogrel (PLAVIX) 75 MG tablet Take 1 tablet (75 mg total) by mouth daily. 05/20/13  Yes Chipper Herb, MD  glimepiride (AMARYL) 4 MG tablet Take 8 mg by mouth daily with breakfast. TAKE 1 TABLET IN THE MORNING BEFORE MEALS 05/20/13  Yes Chipper Herb, MD  glucose blood test strip Use as instructed 05/20/13  Yes Chipper Herb, MD  hydrALAZINE (APRESOLINE) 10 MG tablet Take 1 tablet (10 mg total) by mouth 4 (four) times daily. 05/20/13  Yes Chipper Herb, MD  metFORMIN (GLUCOPHAGE) 500 MG tablet Half tablet with Breakfast, half tablet with lunch, and a whole tablet with supper. 06/04/13  Yes Vernie Shanks, MD  metoprolol succinate (TOPROL-XL) 100 MG 24 hr tablet Take 1 tablet (100 mg total) by mouth daily. 05/20/13  Yes Chipper Herb, MD  nitroGLYCERIN (NITROSTAT) 0.4 MG SL tablet Place 1 tablet (0.4 mg total) under the tongue every 5 (five) minutes as needed. For chest pain 09/24/12  Yes Vernie Shanks, MD  omeprazole (PRILOSEC) 40 MG capsule Take 1 capsule (40 mg total) by mouth daily. 05/20/13  Yes Chipper Herb, MD  PRODIGY TWIST TOP LANCETS 28G [place]  05/27/13  Yes Historical Provider, MD  quinapril-hydrochlorothiazide (ACCURETIC) 20-12.5 MG per tablet Take 1 tablet by mouth daily. 05/24/13  Yes Vernie Shanks, MD  Alcohol Swabs (B-D SINGLE USE SWABS REGULAR) PADS Pt test bid, dx 250.0 06/02/13   Vernie Shanks, MD     ROS: As above in the HPI. All other systems are stable or  negative.  OBJECTIVE: APPEARANCE:  Patient in no acute distress.The patient appeared well nourished and normally developed. Acyanotic. Waist: VITAL SIGNS:BP 155/67  Pulse 57  Temp(Src) 97 F (36.1 C) (Oral)  Ht 5' 1"  (1.549 m)  Wt 159 lb 6.4 oz (72.303 kg)  BMI 30.13 kg/m2 AAF CBGs looks better  SKIN: warm and  Dry without overt rashes, tattoos and scars  HEAD and Neck: without JVD, Head and scalp: normal Eyes:No scleral icterus. Fundi normal, eye movements normal. Ears: Auricle normal, canal normal, Tympanic  membranes normal, insufflation normal. Nose: normal Throat: normal Neck & thyroid: normal  CHEST & LUNGS: Chest wall: normal Lungs: Clear  CVS: Reveals the PMI to be normally located. Regular rhythm, First and Second Heart sounds are normal,  absence of murmurs, rubs or gallops. Peripheral vasculature: Radial pulses: normal Dorsal pedis pulses: normal Posterior pulses: normal  ABDOMEN:  Appearance: Obese Benign, no organomegaly, no masses, no Abdominal Aortic enlargement. No Guarding , no rebound. No Bruits. Bowel sounds: normal  RECTAL: N/A GU: N/A  EXTREMETIES: nonedematous.  MUSCULOSKELETAL:  Spine: normal Joints: intact  NEUROLOGIC: oriented to time,place and person; nonfocal. Strength is normal Sensory is normal Reflexes are normal Cranial Nerves are normal.  ASSESSMENT:  HYPERTENSION, UNSPECIFIED - Plan: CMP14+EGFR  HYPERLIPIDEMIA-MIXED - Plan: CMP14+EGFR, Lipid panel  DIABETES MELLITUS - Plan: POCT glycosylated hemoglobin (Hb A1C)  CAD, multiple vessel  CARDIAC MURMUR  Need for diphtheria-tetanus-pertussis (Tdap) vaccine, adult/adolescent - Plan: Tdap vaccine greater than or equal to 7yo IM  PLAN: DM foot care.handout in the AVS. Orders Placed This Encounter  Procedures  . Tdap vaccine greater than or equal to 7yo IM  . CMP14+EGFR  . Lipid panel  . POCT glycosylated hemoglobin (Hb A1C)  continue the same medications. No  orders of the defined types were placed in this encounter.   There are no discontinued medications. Return in about 3 months (around 10/19/2013) for Recheck medical problems.  Kim Oki P. Jacelyn Grip, M.D.

## 2013-07-19 NOTE — Patient Instructions (Signed)
Diabetes and Foot Care Diabetes may cause you to have problems because of poor blood supply (circulation) to your feet and legs. This may cause the skin on your feet to become thinner, break easier, and heal more slowly. Your skin may become dry, and the skin may peel and crack. You may also have nerve damage in your legs and feet causing decreased feeling in them. You may not notice minor injuries to your feet that could lead to infections or more serious problems. Taking care of your feet is one of the most important things you can do for yourself.  HOME CARE INSTRUCTIONS  Wear shoes at all times, even in the house. Do not go barefoot. Bare feet are easily injured.  Check your feet daily for blisters, cuts, and redness. If you cannot see the bottom of your feet, use a mirror or ask someone for help.  Wash your feet with warm water (do not use hot water) and mild soap. Then pat your feet and the areas between your toes until they are completely dry. Do not soak your feet as this can dry your skin.  Apply a moisturizing lotion or petroleum jelly (that does not contain alcohol and is unscented) to the skin on your feet and to dry, brittle toenails. Do not apply lotion between your toes.  Trim your toenails straight across. Do not dig under them or around the cuticle. File the edges of your nails with an emery board or nail file.  Do not cut corns or calluses or try to remove them with medicine.  Wear clean socks or stockings every day. Make sure they are not too tight. Do not wear knee-high stockings since they may decrease blood flow to your legs.  Wear shoes that fit properly and have enough cushioning. To break in new shoes, wear them for just a few hours a day. This prevents you from injuring your feet. Always look in your shoes before you put them on to be sure there are no objects inside.  Do not cross your legs. This may decrease the blood flow to your feet.  If you find a minor scrape,  cut, or break in the skin on your feet, keep it and the skin around it clean and dry. These areas may be cleansed with mild soap and water. Do not cleanse the area with peroxide, alcohol, or iodine.  When you remove an adhesive bandage, be sure not to damage the skin around it.  If you have a wound, look at it several times a day to make sure it is healing.  Do not use heating pads or hot water bottles. They may burn your skin. If you have lost feeling in your feet or legs, you may not know it is happening until it is too late.  Make sure your health care provider performs a complete foot exam at least annually or more often if you have foot problems. Report any cuts, sores, or bruises to your health care provider immediately. SEEK MEDICAL CARE IF:   You have an injury that is not healing.  You have cuts or breaks in the skin.  You have an ingrown nail.  You notice redness on your legs or feet.  You feel burning or tingling in your legs or feet.  You have pain or cramps in your legs and feet.  Your legs or feet are numb.  Your feet always feel cold. SEEK IMMEDIATE MEDICAL CARE IF:   There is increasing redness,   swelling, or pain in or around a wound.  There is a red line that goes up your leg.  Pus is coming from a wound.  You develop a fever or as directed by your health care provider.  You notice a bad smell coming from an ulcer or wound. Document Released: 03/01/2000 Document Revised: 11/04/2012 Document Reviewed: 08/11/2012 Exit[employer] Patient Information 2014 [employer].  

## 2013-07-20 ENCOUNTER — Ambulatory Visit: Payer: Medicare HMO | Admitting: Family Medicine

## 2013-07-20 LAB — CMP14+EGFR
ALT: 26 IU/L (ref 0–32)
AST: 22 IU/L (ref 0–40)
Albumin/Globulin Ratio: 1.7 (ref 1.1–2.5)
Albumin: 4.3 g/dL (ref 3.6–4.8)
Alkaline Phosphatase: 55 IU/L (ref 39–117)
BUN/Creatinine Ratio: 16 (ref 11–26)
BUN: 17 mg/dL (ref 8–27)
CO2: 28 mmol/L (ref 18–29)
Calcium: 9.6 mg/dL (ref 8.7–10.3)
Chloride: 103 mmol/L (ref 97–108)
Creatinine, Ser: 1.04 mg/dL — ABNORMAL HIGH (ref 0.57–1.00)
GFR calc Af Amer: 65 mL/min/{1.73_m2} (ref >59)
GFR calc non Af Amer: 56 mL/min/{1.73_m2} — ABNORMAL LOW (ref >59)
Globulin, Total: 2.6 g/dL (ref 1.5–4.5)
Glucose: 118 mg/dL — ABNORMAL HIGH (ref 65–99)
Potassium: 4.3 mmol/L (ref 3.5–5.2)
Sodium: 144 mmol/L (ref 134–144)
Total Bilirubin: 0.3 mg/dL (ref 0.0–1.2)
Total Protein: 6.9 g/dL (ref 6.0–8.5)

## 2013-07-20 LAB — LIPID PANEL
Chol/HDL Ratio: 2.5 ratio units (ref 0.0–4.4)
Cholesterol, Total: 146 mg/dL (ref 100–199)
HDL: 59 mg/dL (ref >39)
LDL Calculated: 70 mg/dL (ref 0–99)
Triglycerides: 87 mg/dL (ref 0–149)
VLDL Cholesterol Cal: 17 mg/dL (ref 5–40)

## 2013-08-02 ENCOUNTER — Other Ambulatory Visit: Payer: Self-pay | Admitting: Family Medicine

## 2013-08-06 ENCOUNTER — Other Ambulatory Visit: Payer: Self-pay

## 2013-08-06 MED ORDER — GLIMEPIRIDE 4 MG PO TABS: ORAL_TABLET | ORAL | Status: DC

## 2013-08-17 ENCOUNTER — Other Ambulatory Visit: Payer: Self-pay | Admitting: Family Medicine

## 2013-08-19 ENCOUNTER — Other Ambulatory Visit: Payer: Self-pay

## 2013-08-19 MED ORDER — PRODIGY TWIST TOP LANCETS 28G MISC: 2.0000 | Freq: Every day | Status: DC

## 2013-10-20 ENCOUNTER — Ambulatory Visit (INDEPENDENT_AMBULATORY_CARE_PROVIDER_SITE_OTHER): Payer: Medicare HMO | Admitting: Family

## 2013-10-20 ENCOUNTER — Encounter: Payer: Self-pay | Admitting: Family

## 2013-10-20 VITALS — BP 132/75 | HR 62 | Temp 97.1°F | Ht 61.0 in | Wt 156.8 lb

## 2013-10-20 DIAGNOSIS — I2510 Atherosclerotic heart disease of native coronary artery without angina pectoris: Secondary | ICD-10-CM

## 2013-10-20 DIAGNOSIS — E785 Hyperlipidemia, unspecified: Secondary | ICD-10-CM

## 2013-10-20 DIAGNOSIS — E119 Type 2 diabetes mellitus without complications: Secondary | ICD-10-CM

## 2013-10-20 DIAGNOSIS — K2100 Gastro-esophageal reflux disease with esophagitis, without bleeding: Secondary | ICD-10-CM

## 2013-10-20 DIAGNOSIS — I10 Essential (primary) hypertension: Secondary | ICD-10-CM

## 2013-10-20 DIAGNOSIS — K219 Gastro-esophageal reflux disease without esophagitis: Secondary | ICD-10-CM | POA: Insufficient documentation

## 2013-10-20 DIAGNOSIS — Z1321 Encounter for screening for nutritional disorder: Secondary | ICD-10-CM

## 2013-10-20 LAB — POCT GLYCOSYLATED HEMOGLOBIN (HGB A1C): Hemoglobin A1C: 6.8

## 2013-10-20 NOTE — Patient Instructions (Signed)

## 2013-10-20 NOTE — Progress Notes (Signed)
Subjective:    Patient ID: Tonya Carpenter, female    DOB: 23-Jun-1945, 68 y.o.   MRN: [id number]  Diabetes She presents for her follow-up diabetic visit. She has type 2 diabetes mellitus. Her disease course has been fluctuating. Pertinent negatives for hypoglycemia include no confusion, dizziness, headaches or mood changes. Pertinent negatives for diabetes include no blurred vision, no foot paresthesias, no foot ulcerations and no visual change. Diabetic complications include heart disease and peripheral neuropathy. Pertinent negatives for diabetic complications include no CVA or nephropathy. Risk factors for coronary artery disease include diabetes mellitus, dyslipidemia, hypertension, post-menopausal and family history. Current diabetic treatment includes oral agent (dual therapy). She is compliant with treatment all of the time. She is following a generally healthy diet. Her breakfast blood glucose range is generally 130-140 mg/dl. An ACE inhibitor/angiotensin II receptor blocker is being taken. Eye exam is not current.  Hypertension This is a chronic problem. The current episode started more than 1 year ago. The problem has been resolved since onset. The problem is controlled. Pertinent negatives include no blurred vision, headaches, palpitations, peripheral edema or shortness of breath. Risk factors for coronary artery disease include diabetes mellitus, dyslipidemia, family history and post-menopausal state. Past treatments include ACE inhibitors, beta blockers, direct vasodilators and diuretics. The current treatment provides significant improvement. Hypertensive end-organ damage includes CAD/MI and heart failure. There is no history of kidney disease, CVA or a thyroid problem. There is no history of sleep apnea.  Hyperlipidemia This is a chronic problem. The current episode started more than 1 year ago. The problem is controlled. Recent lipid tests were reviewed and are normal. Exacerbating diseases  include diabetes. She has no history of hypothyroidism. Pertinent negatives include no leg pain, myalgias or shortness of breath. Current antihyperlipidemic treatment includes statins. The current treatment provides significant improvement of lipids. Risk factors for coronary artery disease include diabetes mellitus, dyslipidemia, family history, hypertension and post-menopausal.  Gastrophageal Reflux She reports no belching, no coughing, no heartburn, no sore throat or no tooth decay. This is a chronic problem. The current episode started more than 1 year ago. The problem occurs rarely. The problem has been resolved. The symptoms are aggravated by certain foods. She has tried a PPI for the symptoms. The treatment provided significant relief.      Review of Systems  Constitutional: Negative.   HENT: Negative.  Negative for sore throat.   Eyes: Negative.  Negative for blurred vision.  Respiratory: Negative.  Negative for cough and shortness of breath.   Cardiovascular: Negative.  Negative for palpitations.  Gastrointestinal: Negative.  Negative for heartburn.  Endocrine: Negative.   Genitourinary: Negative.   Musculoskeletal: Negative.  Negative for myalgias.  Neurological: Negative.  Negative for dizziness and headaches.  Hematological: Negative.   Psychiatric/Behavioral: Negative.  Negative for confusion.  All other systems reviewed and are negative.      Objective:   Physical Exam  Vitals reviewed. Constitutional: She is oriented to person, place, and time. She appears well-developed and well-nourished. No distress.  HENT:  Head: Normocephalic and atraumatic.  Right Ear: External ear normal.  Mouth/Throat: Oropharynx is clear and moist.  Eyes: Pupils are equal, round, and reactive to light.  Neck: Normal range of motion. Neck supple. No thyromegaly present.  Cardiovascular: Normal rate, regular rhythm, normal heart sounds and intact distal pulses.   No murmur  heard. Pulmonary/Chest: Effort normal and breath sounds normal. No respiratory distress. She has no wheezes.  Abdominal: Soft. Bowel sounds are normal.  She exhibits no distension. There is no tenderness.  Musculoskeletal: Normal range of motion. She exhibits no edema and no tenderness.  Neurological: She is alert and oriented to person, place, and time. She has normal reflexes. No cranial nerve deficit.  Skin: Skin is warm and dry.  Psychiatric: She has a normal mood and affect. Her behavior is normal. Judgment and thought content normal.    *See diabetic foot note  BP 132/75  Pulse 62  Temp(Src) 97.1 F (36.2 C) (Oral)  Ht 5' 1"  (1.549 m)  Wt 156 lb 12.8 oz (71.124 kg)  BMI 29.64 kg/m2     Assessment & Plan:  1. HYPERTENSION, UNSPECIFIED - CMP14+EGFR  2. DIABETES MELLITUS - POCT glycosylated hemoglobin (Hb A1C) - CMP14+EGFR  3. HYPERLIPIDEMIA-MIXED - CMP14+EGFR - Lipid panel  4. Gastroesophageal reflux disease with esophagitis - CMP14+EGFR  5. CAD, multiple vessel - CMP14+EGFR  6. Encounter for vitamin deficiency screening - Vit D  25 hydroxy (rtn osteoporosis monitoring)   Continue all meds Labs pending Health Maintenance reviewed Diet and exercise encouraged RTO 3 months  Evelina Dun, FNP

## 2013-10-21 ENCOUNTER — Other Ambulatory Visit: Payer: Self-pay | Admitting: Family

## 2013-10-21 LAB — CMP14+EGFR
A/G RATIO: 1.7 (ref 1.1–2.5)
ALBUMIN: 4.4 g/dL (ref 3.6–4.8)
ALT: 22 IU/L (ref 0–32)
AST: 22 IU/L (ref 0–40)
Alkaline Phosphatase: 55 IU/L (ref 39–117)
BUN/Creatinine Ratio: 16 (ref 11–26)
BUN: 19 mg/dL (ref 8–27)
CHLORIDE: 101 mmol/L (ref 97–108)
CO2: 26 mmol/L (ref 18–29)
CREATININE: 1.17 mg/dL — AB (ref 0.57–1.00)
Calcium: 9.9 mg/dL (ref 8.7–10.3)
GFR calc Af Amer: 56 mL/min/{1.73_m2} — ABNORMAL LOW (ref >59)
GFR calc non Af Amer: 49 mL/min/{1.73_m2} — ABNORMAL LOW (ref >59)
GLUCOSE: 84 mg/dL (ref 65–99)
Globulin, Total: 2.6 g/dL (ref 1.5–4.5)
Potassium: 3.9 mmol/L (ref 3.5–5.2)
Sodium: 141 mmol/L (ref 134–144)
TOTAL PROTEIN: 7 g/dL (ref 6.0–8.5)
Total Bilirubin: 0.3 mg/dL (ref 0.0–1.2)

## 2013-10-21 LAB — LIPID PANEL
CHOLESTEROL TOTAL: 125 mg/dL (ref 100–199)
Chol/HDL Ratio: 2.3 ratio units (ref 0.0–4.4)
HDL: 54 mg/dL (ref >39)
LDL CALC: 56 mg/dL (ref 0–99)
Triglycerides: 75 mg/dL (ref 0–149)
VLDL CHOLESTEROL CAL: 15 mg/dL (ref 5–40)

## 2013-10-21 LAB — VITAMIN D 25 HYDROXY (VIT D DEFICIENCY, FRACTURES): Vit D, 25-Hydroxy: 22.7 ng/mL — ABNORMAL LOW (ref 30.0–100.0)

## 2013-10-21 MED ORDER — VITAMIN D (ERGOCALCIFEROL) 1.25 MG (50000 UNIT) PO CAPS: 50000.0000 [IU] | ORAL_CAPSULE | ORAL | Status: DC

## 2013-10-22 ENCOUNTER — Telehealth: Payer: Self-pay | Admitting: Family

## 2013-10-22 MED ORDER — VITAMIN D (ERGOCALCIFEROL) 1.25 MG (50000 UNIT) PO CAPS: 50000.0000 [IU] | ORAL_CAPSULE | ORAL | Status: DC

## 2013-10-22 NOTE — Telephone Encounter (Signed)
done

## 2013-10-26 ENCOUNTER — Other Ambulatory Visit: Payer: Self-pay | Admitting: Family Medicine

## 2013-12-01 ENCOUNTER — Other Ambulatory Visit: Payer: Self-pay | Admitting: Family Medicine

## 2013-12-02 ENCOUNTER — Other Ambulatory Visit: Payer: Self-pay | Admitting: *Deleted

## 2013-12-02 DIAGNOSIS — I10 Essential (primary) hypertension: Secondary | ICD-10-CM

## 2013-12-02 MED ORDER — QUINAPRIL-HYDROCHLOROTHIAZIDE 20-12.5 MG PO TABS
1.0000 | ORAL_TABLET | Freq: Every day | ORAL | Status: DC
Start: 2013-12-02 — End: 2014-02-02

## 2013-12-04 ENCOUNTER — Telehealth: Payer: Self-pay | Admitting: Family

## 2013-12-06 ENCOUNTER — Telehealth: Payer: Self-pay | Admitting: Family Medicine

## 2013-12-28 ENCOUNTER — Ambulatory Visit: Payer: Medicare HMO | Admitting: Family

## 2013-12-28 ENCOUNTER — Telehealth: Payer: Self-pay | Admitting: Family Medicine

## 2013-12-28 DIAGNOSIS — K5900 Constipation, unspecified: Secondary | ICD-10-CM

## 2013-12-28 MED ORDER — POLYETHYLENE GLYCOL 3350 17 GM/SCOOP PO POWD: 17.0000 g | Freq: Two times a day (BID) | ORAL | Status: DC | PRN

## 2013-12-28 NOTE — Telephone Encounter (Signed)
Rx sent to pharmacy per pt's request.  

## 2014-01-25 ENCOUNTER — Ambulatory Visit: Payer: Medicare HMO | Admitting: Family Medicine

## 2014-02-02 ENCOUNTER — Encounter: Payer: Self-pay | Admitting: Family Medicine

## 2014-02-02 ENCOUNTER — Ambulatory Visit (INDEPENDENT_AMBULATORY_CARE_PROVIDER_SITE_OTHER): Payer: Medicare HMO | Admitting: Family Medicine

## 2014-02-02 VITALS — BP 133/72 | HR 97 | Temp 96.9°F | Ht 61.0 in | Wt 156.0 lb

## 2014-02-02 DIAGNOSIS — Z23 Encounter for immunization: Secondary | ICD-10-CM

## 2014-02-02 DIAGNOSIS — E119 Type 2 diabetes mellitus without complications: Secondary | ICD-10-CM

## 2014-02-02 DIAGNOSIS — IMO0002 Reserved for concepts with insufficient information to code with codable children: Secondary | ICD-10-CM

## 2014-02-02 DIAGNOSIS — E1165 Type 2 diabetes mellitus with hyperglycemia: Secondary | ICD-10-CM

## 2014-02-02 DIAGNOSIS — I10 Essential (primary) hypertension: Secondary | ICD-10-CM

## 2014-02-02 LAB — POCT GLYCOSYLATED HEMOGLOBIN (HGB A1C): HEMOGLOBIN A1C: 6.6

## 2014-02-02 LAB — POCT UA - MICROALBUMIN: Microalbumin Ur, POC: NEGATIVE mg/L

## 2014-02-02 MED ORDER — QUINAPRIL-HYDROCHLOROTHIAZIDE 20-12.5 MG PO TABS
1.0000 | ORAL_TABLET | Freq: Every day | ORAL | Status: DC
Start: 2014-02-02 — End: 2014-03-29

## 2014-02-02 MED ORDER — CLOPIDOGREL BISULFATE 75 MG PO TABS: 75.0000 mg | ORAL_TABLET | Freq: Every day | ORAL | Status: DC

## 2014-02-02 MED ORDER — METOPROLOL SUCCINATE ER 100 MG PO TB24: 100.0000 mg | ORAL_TABLET | Freq: Every day | ORAL | Status: DC

## 2014-02-02 MED ORDER — OMEPRAZOLE 40 MG PO CPDR: 40.0000 mg | DELAYED_RELEASE_CAPSULE | Freq: Every day | ORAL | Status: DC

## 2014-02-02 MED ORDER — GLIMEPIRIDE 4 MG PO TABS: ORAL_TABLET | ORAL | Status: DC

## 2014-02-02 MED ORDER — METFORMIN HCL 500 MG PO TABS: 500.0000 mg | ORAL_TABLET | Freq: Two times a day (BID) | ORAL | Status: DC

## 2014-02-02 MED ORDER — ATORVASTATIN CALCIUM 20 MG PO TABS: 20.0000 mg | ORAL_TABLET | Freq: Every day | ORAL | Status: DC

## 2014-02-02 MED ORDER — HYDRALAZINE HCL 10 MG PO TABS: ORAL_TABLET | ORAL | Status: DC

## 2014-02-02 NOTE — Progress Notes (Signed)
   Subjective:    Patient ID: Tonya Carpenter, female    DOB: 01/04/1946, 68 y.o.   MRN: [id number]  HPI  68 year old female here to follow-up diabetes and hypertension. She brings in a record of her glucose testing twice a day and results are really good. She denies any new problems or symptoms  She does have some diarrhea which she attributes to metformin. She is also on MiraLAX and I suggested she might hold that as long as she has diarrhea.  Regarding her blood pressure, she is on quinapril hydrochlorothiazide, as well as metoprolol and hydralazine.  Review of Systems  Constitutional: Negative.   HENT: Negative.   Eyes: Negative.   Respiratory: Negative.   Cardiovascular: Negative.   Gastrointestinal: Negative.   Endocrine: Negative.   Genitourinary: Negative.   Musculoskeletal: Positive for myalgias.       Bilateral thumb pain  Skin: Rash: not true rash but healing intertrigo.  Hematological: Negative.   Psychiatric/Behavioral: Negative.        Objective:   Physical Exam  Constitutional: She is oriented to person, place, and time. She appears well-developed and well-nourished.  Eyes: Conjunctivae and EOM are normal.  Neck: Normal range of motion. Neck supple.  Cardiovascular: Normal rate, regular rhythm and normal heart sounds.   Pulmonary/Chest: Effort normal and breath sounds normal.  Abdominal: Soft. Bowel sounds are normal.  Musculoskeletal: Normal range of motion.  Neurological: She is alert and oriented to person, place, and time. She has normal reflexes.  Skin: Skin is warm and dry.  Psychiatric: She has a normal mood and affect. Her behavior is normal. Thought content normal.   BP 133/72 mmHg  Pulse 97  Temp(Src) 96.9 F (36.1 C) (Oral)  Ht 5\' 1"  (1.549 m)  Wt 156 lb (70.761 kg)  BMI 29.49 kg/m2       Assessment & Plan:  1. Diabetes mellitus type 2, uncontrolled Continue with metformin and Amaryl. I have suggested she decrease her testing to once a day  since it is relatively stable. - POCT glycosylated hemoglobin (Hb A1C) - POCT UA - Microalbumin  2. Encounter for immunization   3. Essential hypertension  - quinapril-hydrochlorothiazide (ACCURETIC) 20-12.5 MG per tablet; Take 1 tablet by mouth daily.  Dispense: 90 tablet; Refi  Frederica KusterStephen M Suhani Stillion MD

## 2014-02-04 ENCOUNTER — Telehealth: Payer: Self-pay

## 2014-02-04 NOTE — Telephone Encounter (Signed)
Pt aware of lab results 

## 2014-02-04 NOTE — Telephone Encounter (Signed)
-----   Message from Frederica KusterStephen M Miller, MD sent at 02/02/2014 12:47 PM EST ----- A1c is at goal; no changes are recommended. Urine microalbumin also negative

## 2014-02-24 ENCOUNTER — Encounter (HOSPITAL_COMMUNITY): Payer: Self-pay | Admitting: Cardiovascular Disease

## 2014-03-24 ENCOUNTER — Telehealth: Payer: Self-pay | Admitting: Family Medicine

## 2014-03-25 ENCOUNTER — Other Ambulatory Visit: Payer: Self-pay | Admitting: Family

## 2014-03-26 ENCOUNTER — Other Ambulatory Visit: Payer: Self-pay | Admitting: Family Medicine

## 2014-03-28 NOTE — Telephone Encounter (Signed)
Was calling to let pt know test strips rx sent to [facility]

## 2014-03-29 ENCOUNTER — Other Ambulatory Visit: Payer: Self-pay | Admitting: Family

## 2014-03-29 ENCOUNTER — Other Ambulatory Visit: Payer: Self-pay | Admitting: Family Medicine

## 2014-03-30 ENCOUNTER — Other Ambulatory Visit: Payer: Self-pay | Admitting: *Deleted

## 2014-03-30 MED ORDER — GLUCOSE BLOOD VI STRP
ORAL_STRIP | Status: DC
Start: 2014-03-30 — End: 2014-06-23

## 2014-03-30 MED ORDER — ATORVASTATIN CALCIUM 20 MG PO TABS: 20.0000 mg | ORAL_TABLET | Freq: Every day | ORAL | Status: DC

## 2014-04-05 ENCOUNTER — Encounter: Payer: Self-pay | Admitting: *Deleted

## 2014-04-05 ENCOUNTER — Other Ambulatory Visit: Payer: Self-pay | Admitting: Family Medicine

## 2014-04-07 MED ORDER — GLIMEPIRIDE 4 MG PO TABS: ORAL_TABLET | ORAL | Status: DC

## 2014-04-07 MED ORDER — BD SWAB SINGLE USE REGULAR PADS: MEDICATED_PAD | Status: DC

## 2014-04-07 NOTE — Telephone Encounter (Signed)
done

## 2014-04-13 DIAGNOSIS — H25011 Cortical age-related cataract, right eye: Secondary | ICD-10-CM | POA: Diagnosis not present

## 2014-04-13 DIAGNOSIS — E119 Type 2 diabetes mellitus without complications: Secondary | ICD-10-CM | POA: Diagnosis not present

## 2014-04-13 DIAGNOSIS — H2513 Age-related nuclear cataract, bilateral: Secondary | ICD-10-CM | POA: Diagnosis not present

## 2014-04-13 LAB — HM DIABETES EYE EXAM

## 2014-04-25 ENCOUNTER — Encounter: Payer: Self-pay | Admitting: *Deleted

## 2014-05-25 ENCOUNTER — Ambulatory Visit (INDEPENDENT_AMBULATORY_CARE_PROVIDER_SITE_OTHER): Payer: Commercial Managed Care - HMO | Admitting: Family Medicine

## 2014-05-25 ENCOUNTER — Encounter: Payer: Self-pay | Admitting: Family Medicine

## 2014-05-25 VITALS — BP 133/94 | HR 71 | Temp 97.0°F | Ht 61.0 in | Wt 157.0 lb

## 2014-05-25 DIAGNOSIS — E119 Type 2 diabetes mellitus without complications: Secondary | ICD-10-CM | POA: Diagnosis not present

## 2014-05-25 DIAGNOSIS — I10 Essential (primary) hypertension: Secondary | ICD-10-CM | POA: Diagnosis not present

## 2014-05-25 DIAGNOSIS — E785 Hyperlipidemia, unspecified: Secondary | ICD-10-CM | POA: Diagnosis not present

## 2014-05-25 DIAGNOSIS — Z23 Encounter for immunization: Secondary | ICD-10-CM

## 2014-05-25 LAB — POCT GLYCOSYLATED HEMOGLOBIN (HGB A1C): Hemoglobin A1C: 7.5

## 2014-05-25 NOTE — Patient Instructions (Addendum)

## 2014-05-25 NOTE — Progress Notes (Signed)
Subjective:    Patient ID: Tonya Carpenter, female    DOB: [birth year], 69 y.o.   MRN: [id number]  HPI 69 year old female here to follow-up her diabetes and hypertension. At her last visit review of blood sugars was stable and I suggested she could decrease frequency to once a day. Since that time her sugars have not been as well for whatever reason probably dietary and this was addressed at the visit. Also, her blood pressure is somewhat elevated today and it turns out she is taking her Apresoline once a day rather than 3 times a day as prescribed. I think we corrected that oversight  Patient Active Problem List   Diagnosis Date Noted  . GERD (gastroesophageal reflux disease) 10/20/2013  . Frequency 02/18/2013  . Chest pain 01/21/2013  . Precordial pain 01/15/2013  . Coronary atherosclerosis of native coronary artery 01/14/2013  . CAD, multiple vessel 07/10/2012  . CHEST PAIN UNSPECIFIED 09/04/2009  . CARDIAC MURMUR 07/03/2009  . Type 2 diabetes mellitus 09/12/2008  . Hyperlipidemia 09/12/2008  . Essential hypertension 09/12/2008   Outpatient Encounter Prescriptions as of 05/25/2014  Medication Sig  . Alcohol Swabs (B-D SINGLE USE SWABS REGULAR) PADS Pt test bid, dx E11.65  . aspirin 81 MG tablet Take 1 tablet (81 mg total) by mouth daily.  Marland Kitchen atorvastatin (LIPITOR) 20 MG tablet Take 1 tablet (20 mg total) by mouth daily.  . Blood Glucose Monitoring Suppl (PRODIGY AUTOCODE BLOOD GLUCOSE) W/DEVICE KIT   . clopidogrel (PLAVIX) 75 MG tablet Take 1 tablet (75 mg total) by mouth daily.  Marland Kitchen glimepiride (AMARYL) 4 MG tablet TAKE 1 TABLET IN THE MORNING BEFORE A MEAL  . glucose blood (PRODIGY NO CODING BLOOD GLUC) test strip Test BS bid. Dx E11.9  . hydrALAZINE (APRESOLINE) 10 MG tablet TAKE 1 TABLET FOUR TIMES DAILY  . Liniments (ORTHOGEL) GEL Apply topically.  . metFORMIN (GLUCOPHAGE) 500 MG tablet TAKE 1 TABLET TWICE DAILY WITH MEALS  . metoprolol succinate (TOPROL-XL) 100 MG 24 hr tablet Take 1  tablet (100 mg total) by mouth daily. Take with or immediately following a meal.  . nitroGLYCERIN (NITROSTAT) 0.4 MG SL tablet Place 1 tablet (0.4 mg total) under the tongue every 5 (five) minutes as needed. For chest pain  . omeprazole (PRILOSEC) 40 MG capsule Take 1 capsule (40 mg total) by mouth daily.  . polyethylene glycol powder (GLYCOLAX/MIRALAX) powder Take 17 g by mouth 2 (two) times daily as needed.  Marland Kitchen PRODIGY TWIST TOP LANCETS 28G MISC Inject 2 Sticks into the skin daily.  . quinapril-hydrochlorothiazide (ACCURETIC) 20-12.5 MG per tablet TAKE 1 TABLET EVERY DAY  . Vitamin D, Ergocalciferol, (DRISDOL) 50000 UNITS CAPS capsule Take 1 capsule (50,000 Units total) by mouth every 7 (seven) days.      Review of Systems  Constitutional: Negative.   HENT: Negative.   Respiratory: Positive for shortness of breath.   Cardiovascular: Negative.   Gastrointestinal: Negative.   Genitourinary: Negative.   Neurological: Negative.        Objective:   Physical Exam  Constitutional: She is oriented to person, place, and time. She appears well-developed and well-nourished.  HENT:  Head: Normocephalic.  Eyes: Pupils are equal, round, and reactive to light.  Cardiovascular: Normal rate and regular rhythm.   Pulmonary/Chest: Effort normal and breath sounds normal.  Neurological: She is alert and oriented to person, place, and time.  Psychiatric: She has a normal mood and affect.    BP 133/94 mmHg  Pulse 71  Temp(Src) 97 F (36.1 C) (Oral)  Ht 5' 1"  (1.549 m)  Wt 157 lb (71.215 kg)  BMI 29.68 kg/m2       Assessment & Plan:  1. Type 2 diabetes mellitus without complication  - POCT glycosylated hemoglobin (Hb A1C)  2. Hyperlipidemia   3. Essential hypertension  Increase Apresoline to TID  Wardell Honour MD

## 2014-05-27 ENCOUNTER — Ambulatory Visit: Payer: Medicare HMO | Admitting: Family Medicine

## 2014-06-13 ENCOUNTER — Other Ambulatory Visit: Payer: Self-pay | Admitting: Family Medicine

## 2014-06-16 ENCOUNTER — Other Ambulatory Visit: Payer: Self-pay | Admitting: Family Medicine

## 2014-06-23 ENCOUNTER — Telehealth: Payer: Self-pay | Admitting: *Deleted

## 2014-06-23 ENCOUNTER — Encounter (HOSPITAL_COMMUNITY): Payer: Self-pay | Admitting: Emergency Medicine

## 2014-06-23 ENCOUNTER — Emergency Department (HOSPITAL_COMMUNITY): Payer: Commercial Managed Care - HMO

## 2014-06-23 ENCOUNTER — Emergency Department (HOSPITAL_COMMUNITY)
Admission: EM | Admit: 2014-06-23 | Discharge: 2014-06-23 | Disposition: A | Discharge status: Home / Self Care | Payer: Commercial Managed Care - HMO | Attending: Emergency Medicine | Admitting: Emergency Medicine

## 2014-06-23 DIAGNOSIS — Z8659 Personal history of other mental and behavioral disorders: Secondary | ICD-10-CM | POA: Insufficient documentation

## 2014-06-23 DIAGNOSIS — I2510 Atherosclerotic heart disease of native coronary artery without angina pectoris: Secondary | ICD-10-CM | POA: Insufficient documentation

## 2014-06-23 DIAGNOSIS — Z8673 Personal history of transient ischemic attack (TIA), and cerebral infarction without residual deficits: Secondary | ICD-10-CM | POA: Diagnosis not present

## 2014-06-23 DIAGNOSIS — Z7902 Long term (current) use of antithrombotics/antiplatelets: Secondary | ICD-10-CM | POA: Insufficient documentation

## 2014-06-23 DIAGNOSIS — R079 Chest pain, unspecified: Secondary | ICD-10-CM | POA: Diagnosis not present

## 2014-06-23 DIAGNOSIS — I10 Essential (primary) hypertension: Secondary | ICD-10-CM | POA: Insufficient documentation

## 2014-06-23 DIAGNOSIS — I252 Old myocardial infarction: Secondary | ICD-10-CM | POA: Diagnosis not present

## 2014-06-23 DIAGNOSIS — E119 Type 2 diabetes mellitus without complications: Secondary | ICD-10-CM | POA: Diagnosis not present

## 2014-06-23 DIAGNOSIS — Z7982 Long term (current) use of aspirin: Secondary | ICD-10-CM | POA: Diagnosis not present

## 2014-06-23 DIAGNOSIS — Z79899 Other long term (current) drug therapy: Secondary | ICD-10-CM | POA: Diagnosis not present

## 2014-06-23 DIAGNOSIS — E669 Obesity, unspecified: Secondary | ICD-10-CM | POA: Diagnosis not present

## 2014-06-23 DIAGNOSIS — M1990 Unspecified osteoarthritis, unspecified site: Secondary | ICD-10-CM | POA: Insufficient documentation

## 2014-06-23 DIAGNOSIS — Z9889 Other specified postprocedural states: Secondary | ICD-10-CM | POA: Insufficient documentation

## 2014-06-23 DIAGNOSIS — R0789 Other chest pain: Secondary | ICD-10-CM | POA: Diagnosis not present

## 2014-06-23 LAB — COMPREHENSIVE METABOLIC PANEL
ALK PHOS: 57 U/L (ref 39–117)
ALT: 16 U/L (ref 0–35)
ANION GAP: 10 (ref 5–15)
AST: 20 U/L (ref 0–37)
Albumin: 4 g/dL (ref 3.5–5.2)
BUN: 14 mg/dL (ref 6–23)
CO2: 28 mmol/L (ref 19–32)
Calcium: 9.7 mg/dL (ref 8.4–10.5)
Chloride: 96 mmol/L (ref 96–112)
Creatinine, Ser: 1.17 mg/dL — ABNORMAL HIGH (ref 0.50–1.10)
GFR, EST AFRICAN AMERICAN: 55 mL/min — AB (ref >90)
GFR, EST NON AFRICAN AMERICAN: 47 mL/min — AB (ref >90)
GLUCOSE: 278 mg/dL — AB (ref 70–99)
Potassium: 3.5 mmol/L (ref 3.5–5.1)
Sodium: 134 mmol/L — ABNORMAL LOW (ref 135–145)
Total Bilirubin: 0.7 mg/dL (ref 0.3–1.2)
Total Protein: 8.1 g/dL (ref 6.0–8.3)

## 2014-06-23 LAB — CBC WITH DIFFERENTIAL/PLATELET
Basophils Absolute: 0 10*3/uL (ref 0.0–0.1)
Basophils Relative: 0 % (ref 0–1)
Eosinophils Absolute: 0 10*3/uL (ref 0.0–0.7)
Eosinophils Relative: 0 % (ref 0–5)
HEMATOCRIT: 39.7 % (ref 36.0–46.0)
HEMOGLOBIN: 12.9 g/dL (ref 12.0–15.0)
LYMPHS ABS: 1.9 10*3/uL (ref 0.7–4.0)
Lymphocytes Relative: 12 % (ref 12–46)
MCH: 28.4 pg (ref 26.0–34.0)
MCHC: 32.5 g/dL (ref 30.0–36.0)
MCV: 87.4 fL (ref 78.0–100.0)
MONO ABS: 1.2 10*3/uL — AB (ref 0.1–1.0)
MONOS PCT: 8 % (ref 3–12)
NEUTROS PCT: 80 % — AB (ref 43–77)
Neutro Abs: 12.8 10*3/uL — ABNORMAL HIGH (ref 1.7–7.7)
Platelets: 208 10*3/uL (ref 150–400)
RBC: 4.54 MIL/uL (ref 3.87–5.11)
RDW: 12.8 % (ref 11.5–15.5)
WBC: 15.9 10*3/uL — ABNORMAL HIGH (ref 4.0–10.5)

## 2014-06-23 LAB — TROPONIN I

## 2014-06-23 NOTE — ED Notes (Signed)
PT c/o central chest pain with tightness and some SOB on exertion x2 days. PT denies any asa or nitroglycerin this am.

## 2014-06-23 NOTE — ED Provider Notes (Addendum)
CSN: [id number]     Arrival date & time 06/23/14  1009 History  This chart was scribed for Donnetta HutchingBrian Kalecia Hartney, MD by Carl Bestelina Holson, ED Scribe. This patient was seen in room APA06/APA06 and the patient's care was started at 10:53 AM.    Chief Complaint  Patient presents with  . Chest Pain    HPI HPI Comments: Tonya Carpenter is a 69 y.o. female who presents to the Emergency Department complaining of constant centralized chest pain that started three days ago.  She describes the pain as being similar to indigestion.  Bending over to pick something up aggravates the pain.  Laying still alleviates the pain.  She has a history of MI.  [place] heart [place] [employer] and she has not received it yet.  She denies any new SOB and diaphoresis as associated symptoms.  Her PCPs are Dr. Christell ConstantMoore and Dr. Hyacinth MeekerMiller in [place].    Past Medical History  Diagnosis Date  . Hypertension   . Type 2 diabetes mellitus   . Hyperlipidemia   . Obesity   . Anxiety   . Coronary artery disease   . Myocardial infarction     "several years back" (01/14/2013)  . Asthma   . Exertional shortness of breath   . Stroke     "light stroke long years ago; didn't affect me permanently" (01/14/2013)  . Arthritis     "hands" (01/14/2013)   Past Surgical History  Procedure Laterality Date  . Tubal ligation    . Cholecystectomy    . Tonsillectomy    . Appendectomy    . Cardiovascular stress test  12/2009    no evidence for stress-induced reversibility or ischemia.  She had a fixed anterior septal wall defect, possibly related to breast attenuation and her EF was 54%.  . Cardiac catheterization  2008    no angiographically significant CAD  . Cardiac catheterization  01/14/2013  . Vaginal hysterectomy    . Dilation and curettage of uterus    . Left heart catheterization with coronary angiogram N/A 01/14/2013    Procedure: LEFT HEART CATHETERIZATION WITH CORONARY ANGIOGRAM;  Surgeon: Kathleene Hazelhristopher D McAlhany, MD;   [facility];  Service: Cardiovascular;  Laterality: N/A;   Family History  Problem Relation Age of Onset  . Hypertension Mother    History  Substance Use Topics  . Smoking status: Never Smoker   . Smokeless tobacco: Never Used  . Alcohol Use: No   OB History    Gravida Para Term Preterm AB TAB SAB Ectopic Multiple Living            2     Review of Systems  Constitutional: Negative for diaphoresis.  Respiratory: Negative for shortness of breath.   Cardiovascular: Positive for chest pain.  All other systems reviewed and are negative.   Allergies  Review of patient's allergies indicates no known allergies.  Home Medications   Prior to Admission medications   Medication Sig Start Date End Date Taking? Authorizing Provider  aspirin 81 MG tablet Take 1 tablet (81 mg total) by mouth daily. 01/15/13  Yes Rhonda G Barrett, PA-C  atorvastatin (LIPITOR) 20 MG tablet Take 1 tablet (20 mg total) by mouth daily. 03/30/14  Yes Frederica KusterStephen M Miller, MD  clopidogrel (PLAVIX) 75 MG tablet Take 1 tablet (75 mg total) by mouth daily. 02/02/14  Yes Frederica KusterStephen M Miller, MD  glimepiride (AMARYL) 4 MG tablet TAKE 1 TABLET IN THE MORNING BEFORE A  MEAL 04/07/14  Yes Frederica Kuster, MD  hydrALAZINE (APRESOLINE) 10 MG tablet TAKE 1 TABLET FOUR TIMES DAILY 02/02/14  Yes Frederica Kuster, MD  Liniments (ORTHOGEL) GEL Apply 1 application topically daily as needed (pain).    Yes Historical Provider, MD  metFORMIN (GLUCOPHAGE) 500 MG tablet TAKE 1 TABLET TWICE DAILY WITH MEALS 03/30/14  Yes Frederica Kuster, MD  metoprolol succinate (TOPROL-XL) 100 MG 24 hr tablet Take 1 tablet (100 mg total) by mouth daily. Take with or immediately following a meal. 02/02/14  Yes Frederica Kuster, MD  nitroGLYCERIN (NITROSTAT) 0.4 MG SL tablet Place 1 tablet (0.4 mg total) under the tongue every 5 (five) minutes as needed. For chest pain 09/24/12  Yes Ileana Ladd, MD  omeprazole (PRILOSEC) 40 MG capsule Take 1 capsule  (40 mg total) by mouth daily. 02/02/14  Yes Frederica Kuster, MD  polyethylene glycol powder (GLYCOLAX/MIRALAX) powder Take 17 g by mouth 2 (two) times daily as needed. 12/28/13  Yes Junie Spencer, FNP  quinapril-hydrochlorothiazide (ACCURETIC) 20-12.5 MG per tablet TAKE 1 TABLET EVERY DAY 03/30/14  Yes Frederica Kuster, MD   Triage Vitals: BP 156/84 mmHg  Pulse 103  Temp(Src) 98.5 F (36.9 C) (Oral)  Resp 18  Ht  (1.626 m)  Wt 156 lb (70.761 kg)  BMI 26.76 kg/m2  SpO2 99%  Physical Exam  ED Course  Procedures (including critical care time)  DIAGNOSTIC STUDIES: Oxygen Saturation is 99% on room air, normal by my interpretation.    COORDINATION OF CARE: 11:00 AM- Will run basic screening tests and encourage the patient to take her heart medication.  The patient agreed to the treatment plan.   Labs Review Labs Reviewed  CBC WITH DIFFERENTIAL/PLATELET - Abnormal; Notable for the following:    WBC 15.9 (*)    Neutrophils Relative % 80 (*)    Neutro Abs 12.8 (*)    Monocytes Absolute 1.2 (*)    All other components within normal limits  COMPREHENSIVE METABOLIC PANEL - Abnormal; Notable for the following:    Sodium 134 (*)    Glucose, Bld 278 (*)    Creatinine, Ser 1.17 (*)    GFR calc non Af Amer 47 (*)    GFR calc Af Amer 55 (*)    All other components within normal limits  TROPONIN I    Imaging Review Dg Chest 2 View  06/23/2014   CLINICAL DATA:  Chest pain worsening with movement  EXAM: CHEST  2 VIEW  COMPARISON:  01/21/2013  FINDINGS: Cardiomediastinal silhouette is stable. No acute infiltrate or pleural effusion. No pulmonary edema. Mild degenerative changes mid and lower thoracic spine.  IMPRESSION: No active cardiopulmonary disease. Mild degenerative changes mid and lower thoracic spine.   Electronically Signed   By: Natasha Mead M.D.   On: 06/23/2014 10:40     EKG Interpretation   Date/Time:  Thursday June 23 2014 10:16:11 EDT Ventricular Rate:  97 PR  Interval:  165 QRS Duration: 168 QT Interval:  419 QTC Calculation: 532 R Axis:   38 Text Interpretation:  Sinus rhythm Ventricular premature complex Left  bundle branch block Baseline wander in lead(s) II III aVF Confirmed by  Drayven Marchena  MD, Keshayla Schrum ([id number]) on 06/23/2014 10:23:36 AM      MDM   Final diagnoses:  Chest pain, unspecified chest pain type    EKG, chest x-ray, troponin were normal. Patient appears nontoxic. Glucose elevated at 278. She has primary care follow-up.  I personally performed the services described in this documentation, which was scribed in my presence. The recorded information has been reviewed and is accurate.    Donnetta Hutching, MD 06/23/14 1331  Donnetta Hutching, MD 06/28/14 [phone]

## 2014-06-23 NOTE — Telephone Encounter (Signed)
Patient called in complaining with chest discomfort. Patient advised that she needs to call 911 and go to the ER for evaluation.

## 2014-06-23 NOTE — Discharge Instructions (Signed)
Tests were good with the exception of your glucose which was 278. Tylenol or ibuprofen for pain. Follow-up your primary care doctor.

## 2014-06-23 NOTE — ED Notes (Signed)
MD at bedside. 

## 2014-06-27 ENCOUNTER — Telehealth: Payer: Self-pay | Admitting: Family Medicine

## 2014-06-27 NOTE — Telephone Encounter (Signed)
Pt aware to continue her metroprolol

## 2014-07-05 ENCOUNTER — Other Ambulatory Visit: Payer: Self-pay | Admitting: Family Medicine

## 2014-07-26 ENCOUNTER — Other Ambulatory Visit: Payer: Self-pay | Admitting: *Deleted

## 2014-07-26 MED ORDER — TRUEPLUS LANCETS 28G MISC: Status: DC

## 2014-07-26 MED ORDER — GLUCOSE BLOOD VI STRP: ORAL_STRIP | Status: DC

## 2014-07-26 MED ORDER — TRUE METRIX AIR GLUCOSE METER W/DEVICE KIT: 1.0000 | PACK | Freq: Two times a day (BID) | Status: DC

## 2014-08-11 ENCOUNTER — Other Ambulatory Visit: Payer: Self-pay | Admitting: *Deleted

## 2014-08-11 MED ORDER — HYDRALAZINE HCL 10 MG PO TABS: ORAL_TABLET | ORAL | Status: DC

## 2014-09-03 ENCOUNTER — Telehealth: Payer: Self-pay | Admitting: Family Medicine

## 2014-09-05 ENCOUNTER — Other Ambulatory Visit: Payer: Self-pay

## 2014-09-05 DIAGNOSIS — I2510 Atherosclerotic heart disease of native coronary artery without angina pectoris: Secondary | ICD-10-CM

## 2014-09-05 MED ORDER — METOPROLOL SUCCINATE ER 100 MG PO TB24: 100.0000 mg | ORAL_TABLET | Freq: Every day | ORAL | Status: DC

## 2014-09-05 MED ORDER — NITROGLYCERIN 0.4 MG SL SUBL: 0.4000 mg | SUBLINGUAL_TABLET | SUBLINGUAL | Status: DC | PRN

## 2014-09-05 MED ORDER — OMEPRAZOLE 40 MG PO CPDR: 40.0000 mg | DELAYED_RELEASE_CAPSULE | Freq: Every day | ORAL | Status: DC

## 2014-09-05 MED ORDER — QUINAPRIL-HYDROCHLOROTHIAZIDE 20-12.5 MG PO TABS: 1.0000 | ORAL_TABLET | Freq: Every day | ORAL | Status: DC

## 2014-09-06 ENCOUNTER — Other Ambulatory Visit: Payer: Self-pay | Admitting: Family Medicine

## 2014-09-06 ENCOUNTER — Other Ambulatory Visit: Payer: Self-pay | Admitting: Family

## 2014-09-14 IMAGING — CR DG CHEST 2V
2 series · 2 of 2 positions shown · non-contrast
Comparison: September 09, 2011

CLINICAL DATA: Chest pain

EXAM:
CHEST  2 VIEW

[view not recorded (1 of 2)]
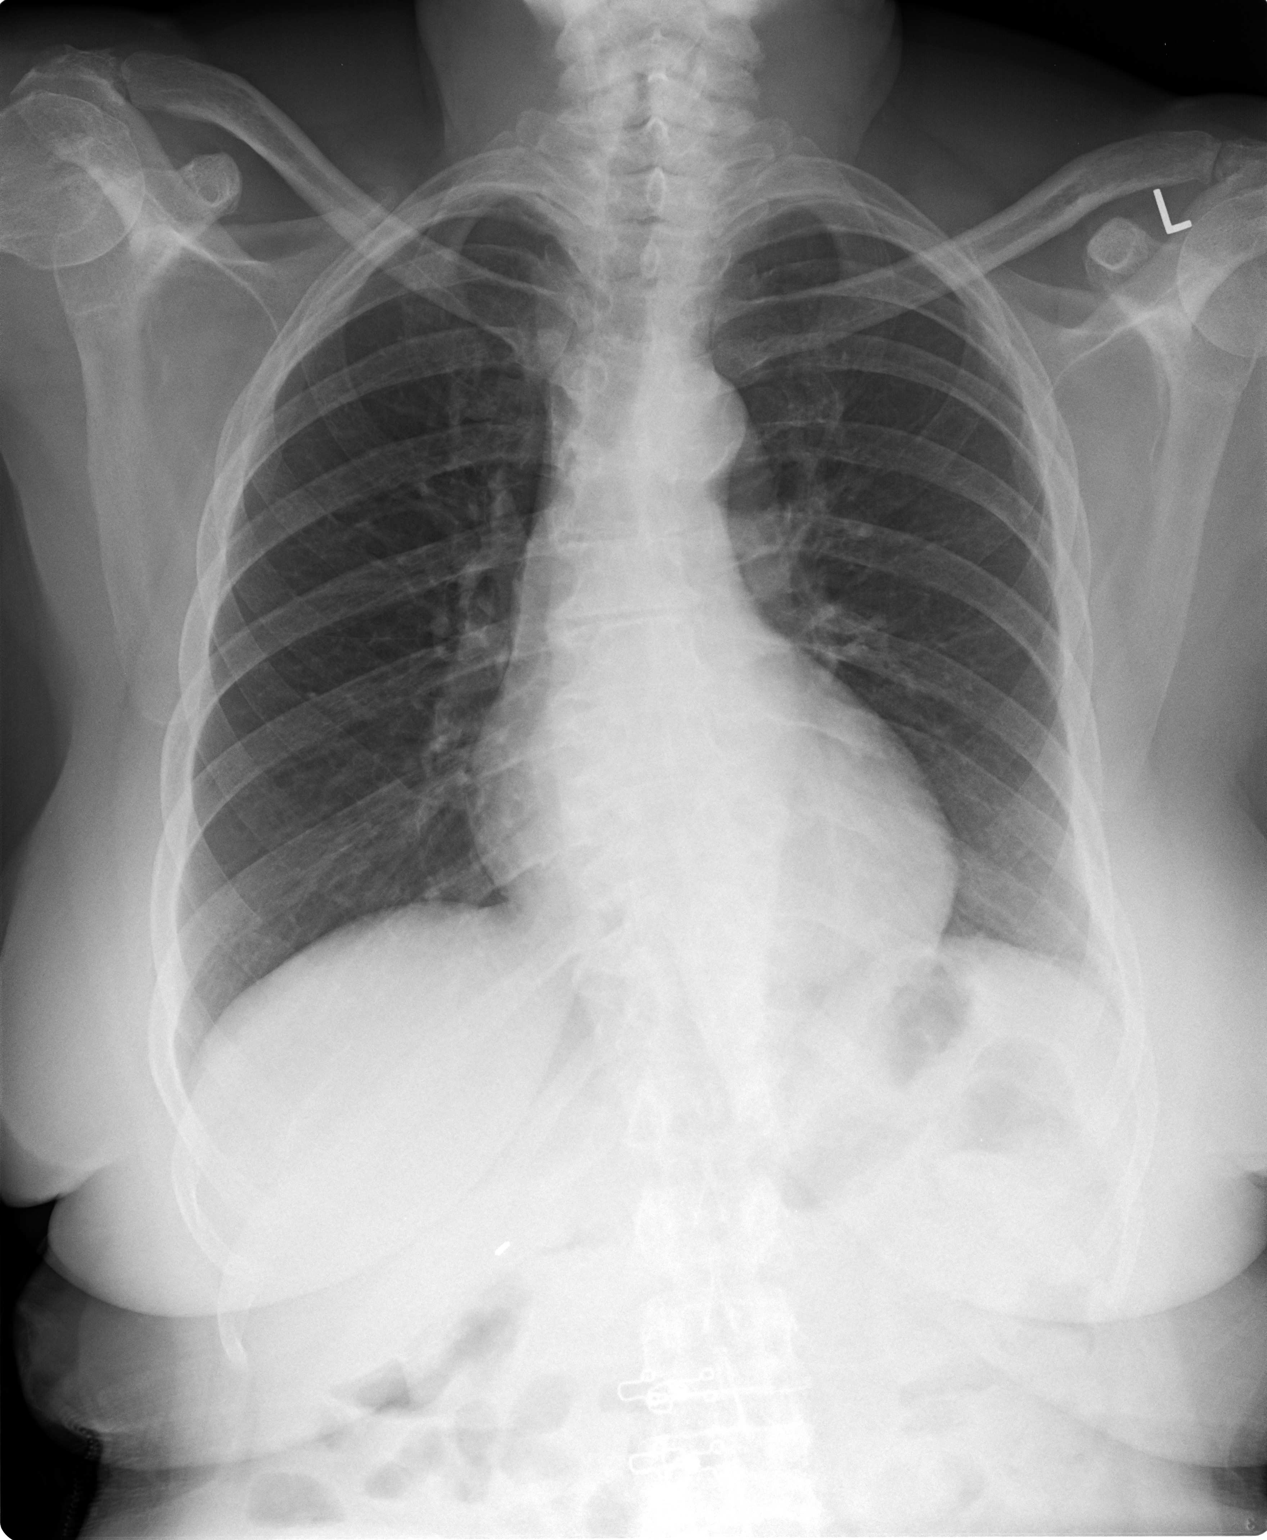

[view not recorded (2 of 2)]
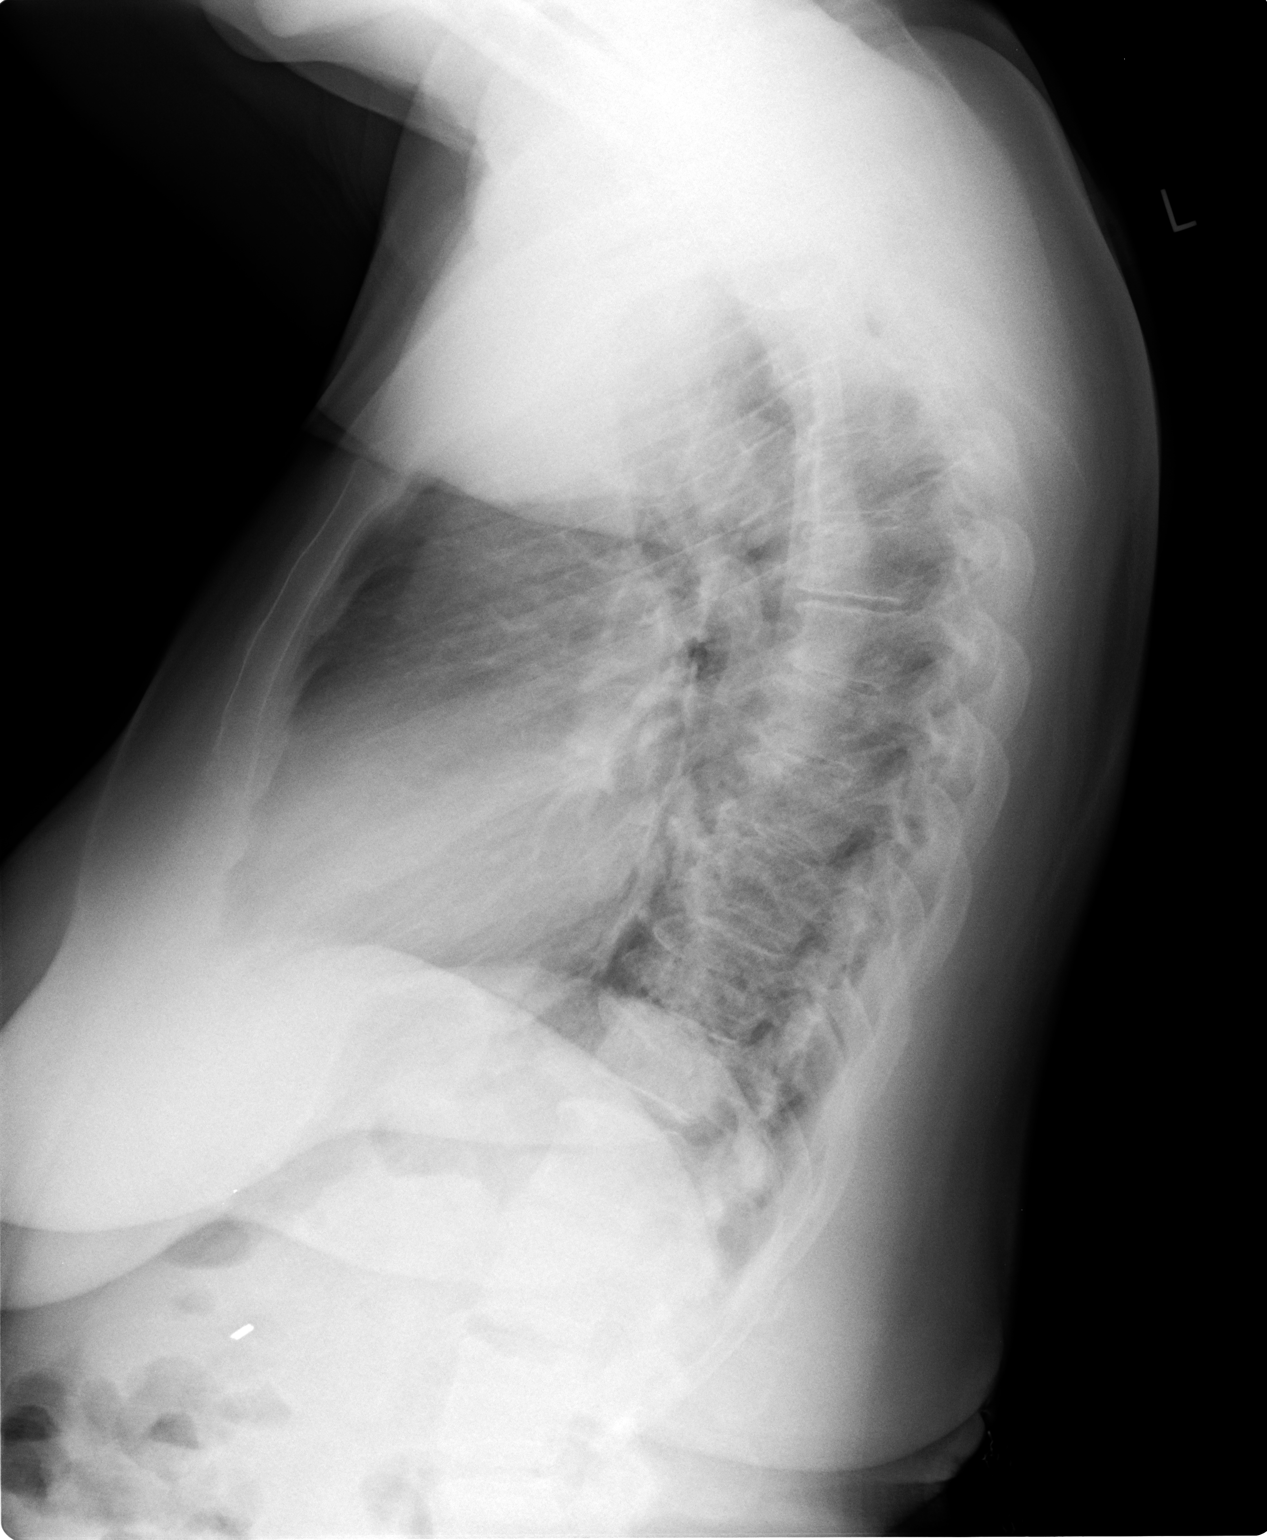

[2 of 2 positions shown; findings below may reference images not displayed]

FINDINGS: The lungs are clear. Heart size and pulmonary vascularity are
normal. No adenopathy. There is mild atherosclerotic change in the
aorta. There is degenerative change in the thoracic spine. There
there is mid thoracic dextroscoliosis.
IMPRESSION: No edema or consolidation.

## 2014-09-16 ENCOUNTER — Other Ambulatory Visit: Payer: Self-pay | Admitting: Family

## 2014-10-03 ENCOUNTER — Encounter: Payer: Self-pay | Admitting: Family Medicine

## 2014-10-03 ENCOUNTER — Ambulatory Visit (INDEPENDENT_AMBULATORY_CARE_PROVIDER_SITE_OTHER): Payer: Commercial Managed Care - HMO | Admitting: Family Medicine

## 2014-10-03 VITALS — BP 118/65 | HR 67 | Temp 97.4°F | Ht 64.0 in | Wt 158.0 lb

## 2014-10-03 DIAGNOSIS — E119 Type 2 diabetes mellitus without complications: Secondary | ICD-10-CM | POA: Diagnosis not present

## 2014-10-03 DIAGNOSIS — K2100 Gastro-esophageal reflux disease with esophagitis, without bleeding: Secondary | ICD-10-CM

## 2014-10-03 DIAGNOSIS — E785 Hyperlipidemia, unspecified: Secondary | ICD-10-CM | POA: Diagnosis not present

## 2014-10-03 DIAGNOSIS — I10 Essential (primary) hypertension: Secondary | ICD-10-CM | POA: Diagnosis not present

## 2014-10-03 LAB — POCT GLYCOSYLATED HEMOGLOBIN (HGB A1C): Hemoglobin A1C: 7.6

## 2014-10-03 NOTE — Patient Instructions (Signed)
Medicare Annual Wellness Visit  [place] and the medical providers at [facility] strive to bring you the best medical care.  In doing so we not only want to address your current medical conditions and concerns but also to detect new conditions early and prevent illness, disease and health-related problems.    Medicare offers a yearly Wellness Visit which allows our clinical staff to assess your need for preventative services including immunizations, lifestyle education, counseling to decrease risk of preventable diseases and screening for fall risk and other medical concerns.    This visit is provided free of charge (no copay) for all Medicare recipients. The clinical pharmacists at [facility] have begun to conduct these Wellness Visits which will also include a thorough review of all your medications.    As you primary medical provider recommend that you make an appointment for your Annual Wellness Visit if you have not done so already this year.  You may set up this appointment before you leave today or you may call back [phone]) and schedule an appointment.  Please make sure when you call that you mention that you are scheduling your Annual Wellness Visit with the clinical pharmacist so that the appointment may be made for the proper length of time.     Continue current medications. Continue good therapeutic lifestyle changes which include good diet and exercise. Fall precautions discussed with patient. If an FOBT was given today- please return it to our front desk. If you are over 50 years old - you may need Prevnar 13 or the adult Pneumonia vaccine.  Flu Shots are still available at our office. If you still haven't had one please call to set up a nurse visit to get one.   After your visit with us today you will receive a survey in the mail or online from [employer] regarding your care with us. Please take a moment to  fill this out. Your feedback is very important to us as you can help us better understand your patient needs as well as improve your experience and satisfaction. WE CARE ABOUT YOU!!!   

## 2014-10-03 NOTE — Progress Notes (Signed)
Subjective:    Patient ID: [name], female    DOB: September 26, 1945, 69 y.o.   MRN: [id number]  HPI Pt here for follow up and management of chronic medical problems which includes diabetes, hypertension, and hyperlipidemia. She is taking medications regularly.  She brings in a record of her blood sugars and generally these have been okay her last A1c is 7.5 and I will expect similar readings today. She denies any chest pain. We went over all her medicines in some detail. I did not see any we could stop except for Plavix. She has not had a stent, CVA, or unstable angina. She does take aspirin so I think we could discontinue Plavix at this point      Patient Active Problem List   Diagnosis Date Noted  . GERD (gastroesophageal reflux disease) 10/20/2013  . Frequency 02/18/2013  . Chest pain 01/21/2013  . Precordial pain 01/15/2013  . Coronary atherosclerosis of native coronary artery 01/14/2013  . CAD, multiple vessel 07/10/2012  . CHEST PAIN UNSPECIFIED 09/04/2009  . CARDIAC MURMUR 07/03/2009  . Type 2 diabetes mellitus 09/12/2008  . Hyperlipidemia 09/12/2008  . Essential hypertension 09/12/2008   Outpatient Encounter Prescriptions as of 10/03/2014  Medication Sig  . aspirin 81 MG tablet Take 1 tablet (81 mg total) by mouth daily.  Marland Kitchen atorvastatin (LIPITOR) 20 MG tablet Take 1 tablet (20 mg total) by mouth daily.  . Blood Glucose Monitoring Suppl (TRUE METRIX AIR GLUCOSE METER) W/DEVICE KIT 1 Device by Does not apply route 2 (two) times daily. Use to monitor blood sugar bid. DX E11.9  . cholecalciferol (VITAMIN D) 1000 UNITS tablet   . clopidogrel (PLAVIX) 75 MG tablet TAKE 1 TABLET EVERY DAY  . glimepiride (AMARYL) 4 MG tablet TAKE 1 TABLET IN THE MORNING BEFORE A MEAL  . glucose blood test strip True Metrix test strips. Use bid. Dx E11.9  . hydrALAZINE (APRESOLINE) 10 MG tablet TAKE 1 TABLET FOUR TIMES DAILY  . Liniments (ORTHOGEL) GEL Apply 1 application topically daily as needed  (pain).   . metFORMIN (GLUCOPHAGE) 500 MG tablet TAKE 1 TABLET TWICE DAILY WITH MEALS  . metoprolol succinate (TOPROL-XL) 100 MG 24 hr tablet Take 1 tablet (100 mg total) by mouth daily. Take with or immediately following a meal.  . nitroGLYCERIN (NITROSTAT) 0.4 MG SL tablet Place 1 tablet (0.4 mg total) under the tongue every 5 (five) minutes as needed. For chest pain  . omeprazole (PRILOSEC) 40 MG capsule Take 1 capsule (40 mg total) by mouth daily.  . polyethylene glycol powder (GLYCOLAX/MIRALAX) powder Take 17 g by mouth 2 (two) times daily as needed.  . quinapril-hydrochlorothiazide (ACCURETIC) 20-12.5 MG per tablet Take 1 tablet by mouth daily.  . TRUEPLUS LANCETS 28G MISC Test blood sugar bid. DX E11.9   No facility-administered encounter medications on file as of 10/03/2014.      Review of Systems  Constitutional: Negative.   HENT: Negative.   Eyes: Negative.   Respiratory: Negative.   Cardiovascular: Negative.   Gastrointestinal: Negative.   Endocrine: Negative.   Genitourinary: Negative.   Musculoskeletal: Negative.   Skin: Negative.   Allergic/Immunologic: Negative.   Neurological: Negative.   Hematological: Negative.   Psychiatric/Behavioral: Negative.        Objective:   Physical Exam  Constitutional: She is oriented to person, place, and time. She appears well-developed and well-nourished.  Eyes: Pupils are equal, round, and reactive to light.  Neck: Normal range of motion.  Cardiovascular: Regular rhythm.  Pulmonary/Chest: Effort normal and breath sounds normal.  Neurological: She is alert and oriented to person, place, and time.  Psychiatric: She has a normal mood and affect. Her behavior is normal.   BP 118/65 mmHg  Pulse 67  Temp(Src) 97.4 F (36.3 C) (Oral)  Ht 5' 4"  (1.626 m)  Wt 158 lb (71.668 kg)  BMI 27.11 kg/m2        Assessment & Plan:  1. Type 2 diabetes mellitus without complication Currently takes metformin and glimepiride. As noted  above checking her sugars I think are all less than 200 and muscle less than 150 - POCT glycosylated hemoglobin (Hb A1C) - CMP14+EGFR  2. Hyperlipidemia Lipids were last checked about a year ago and were at goal - Lipid panel - CMP14+EGFR  3. Essential hypertension Blood pressure today is 118/65 on 3 drug regimen including metoprolol quinapril and hydralazine  4. Gastroesophageal reflux disease with esophagitis Some close well controlled with omeprazole

## 2014-10-04 ENCOUNTER — Encounter: Payer: Self-pay | Admitting: *Deleted

## 2014-10-04 DIAGNOSIS — H25013 Cortical age-related cataract, bilateral: Secondary | ICD-10-CM | POA: Diagnosis not present

## 2014-10-04 DIAGNOSIS — E119 Type 2 diabetes mellitus without complications: Secondary | ICD-10-CM | POA: Diagnosis not present

## 2014-10-04 DIAGNOSIS — H2513 Age-related nuclear cataract, bilateral: Secondary | ICD-10-CM | POA: Diagnosis not present

## 2014-10-04 DIAGNOSIS — H2511 Age-related nuclear cataract, right eye: Secondary | ICD-10-CM | POA: Diagnosis not present

## 2014-10-04 DIAGNOSIS — H25011 Cortical age-related cataract, right eye: Secondary | ICD-10-CM | POA: Diagnosis not present

## 2014-10-04 LAB — CMP14+EGFR
A/G RATIO: 1.4 (ref 1.1–2.5)
ALBUMIN: 4.2 g/dL (ref 3.6–4.8)
ALK PHOS: 54 IU/L (ref 39–117)
ALT: 15 IU/L (ref 0–32)
AST: 18 IU/L (ref 0–40)
BILIRUBIN TOTAL: 0.4 mg/dL (ref 0.0–1.2)
BUN/Creatinine Ratio: 13 (ref 11–26)
BUN: 15 mg/dL (ref 8–27)
CO2: 25 mmol/L (ref 18–29)
Calcium: 9.7 mg/dL (ref 8.7–10.3)
Chloride: 101 mmol/L (ref 97–108)
Creatinine, Ser: 1.2 mg/dL — ABNORMAL HIGH (ref 0.57–1.00)
GFR calc non Af Amer: 47 mL/min/{1.73_m2} — ABNORMAL LOW (ref >59)
GFR, EST AFRICAN AMERICAN: 54 mL/min/{1.73_m2} — AB (ref >59)
Globulin, Total: 3 g/dL (ref 1.5–4.5)
Glucose: 75 mg/dL (ref 65–99)
POTASSIUM: 3.8 mmol/L (ref 3.5–5.2)
Sodium: 142 mmol/L (ref 134–144)
Total Protein: 7.2 g/dL (ref 6.0–8.5)

## 2014-10-04 LAB — HM DIABETES EYE EXAM

## 2014-10-04 LAB — LIPID PANEL
CHOL/HDL RATIO: 2.2 ratio (ref 0.0–4.4)
Cholesterol, Total: 141 mg/dL (ref 100–199)
HDL: 64 mg/dL (ref >39)
LDL CALC: 61 mg/dL (ref 0–99)
Triglycerides: 78 mg/dL (ref 0–149)
VLDL CHOLESTEROL CAL: 16 mg/dL (ref 5–40)

## 2014-10-17 NOTE — Patient Instructions (Signed)
Your procedure is scheduled on: 10/25/2014  Report to [facility] at  630   AM.  Call this number if you have problems the morning of surgery: [phone]   Do not eat food or drink liquids :After Midnight.      Take these medicines the morning of surgery with A SIP OF WATER: metoprolol, prilosec. DO NOT take any diabetic medicines the morning of surgery.   Do not wear jewelry, make-up or nail polish.  Do not wear lotions, powders, or perfumes.   Do not shave 48 hours prior to surgery.  Do not bring valuables to the hospital.  Contacts, dentures or bridgework may not be worn into surgery.  Leave suitcase in the car. After surgery it may be brought to your room.  For patients admitted to the hospital, checkout time is 11:00 AM the day of discharge.   Patients discharged the day of surgery will not be allowed to drive home.  :     Please read over the following fact sheets that you were given: Coughing and Deep Breathing, Surgical Site Infection Prevention, Anesthesia Post-op Instructions and Care and Recovery After Surgery    Cataract A cataract is a clouding of the lens of the eye. When a lens becomes cloudy, vision is reduced based on the degree and nature of the clouding. Many cataracts reduce vision to some degree. Some cataracts make people more near-sighted as they develop. Other cataracts increase glare. Cataracts that are ignored and become worse can sometimes look white. The white color can be seen through the pupil. CAUSES   Aging. However, cataracts may occur at any age, even in newborns.   Certain drugs.   Trauma to the eye.   Certain diseases such as diabetes.   Specific eye diseases such as chronic inflammation inside the eye or a sudden attack of a rare form of glaucoma.   Inherited or acquired medical problems.  SYMPTOMS   Gradual, progressive drop in vision in the affected eye.   Severe, rapid visual loss. This most often happens when trauma is the cause.    DIAGNOSIS  To detect a cataract, an eye doctor examines the lens. Cataracts are best diagnosed with an exam of the eyes with the pupils enlarged (dilated) by drops.  TREATMENT  For an early cataract, vision may improve by using different eyeglasses or stronger lighting. If that does not help your vision, surgery is the only effective treatment. A cataract needs to be surgically removed when vision loss interferes with your everyday activities, such as driving, reading, or watching TV. A cataract may also have to be removed if it prevents examination or treatment of another eye problem. Surgery removes the cloudy lens and usually replaces it with a substitute lens (intraocular lens, IOL).  At a time when both you and your doctor agree, the cataract will be surgically removed. If you have cataracts in both eyes, only one is usually removed at a time. This allows the operated eye to heal and be out of danger from any possible problems after surgery (such as infection or poor wound healing). In rare cases, a cataract may be doing damage to your eye. In these cases, your caregiver may advise surgical removal right away. The vast majority of people who have cataract surgery have better vision afterward. HOME CARE INSTRUCTIONS  If you are not planning surgery, you may be asked to do the following:  Use different eyeglasses.   Use stronger or brighter lighting.  Ask your eye doctor about reducing your medicine dose or changing medicines if it is thought that a medicine caused your cataract. Changing medicines does not make the cataract go away on its own.   Become familiar with your surroundings. Poor vision can lead to injury. Avoid bumping into things on the affected side. You are at a higher risk for tripping or falling.   Exercise extreme care when driving or operating machinery.   Wear sunglasses if you are sensitive to bright light or experiencing problems with glare.  SEEK IMMEDIATE MEDICAL CARE  IF:   You have a worsening or sudden vision loss.   You notice redness, swelling, or increasing pain in the eye.   You have a fever.  Document Released: 03/04/2005 Document Revised: 02/21/2011 Document Reviewed: 10/26/2010 [facility] Patient Information 2012 [email or web address] INSTRUCTIONS POST-ANESTHESIA  IMMEDIATELY FOLLOWING SURGERY:  Do not drive or operate machinery for the first twenty four hours after surgery.  Do not make any important decisions for twenty four hours after surgery or while taking narcotic pain medications or sedatives.  If you develop intractable nausea and vomiting or a severe headache please notify your doctor immediately.  FOLLOW-UP:  Please make an appointment with your surgeon as instructed. You do not need to follow up with anesthesia unless specifically instructed to do so.  WOUND CARE INSTRUCTIONS (if applicable):  Keep a dry clean dressing on the anesthesia/puncture wound site if there is drainage.  Once the wound has quit draining you may leave it open to air.  Generally you should leave the bandage intact for twenty four hours unless there is drainage.  If the epidural site drains for more than 36-48 hours please call the anesthesia department.  QUESTIONS?:  Please feel free to call your physician or the hospital operator if you have any questions, and they will be happy to assist you.

## 2014-10-19 ENCOUNTER — Encounter: Payer: Self-pay | Admitting: *Deleted

## 2014-10-20 ENCOUNTER — Encounter (HOSPITAL_COMMUNITY)
Admission: RE | Admit: 2014-10-20 | Discharge: 2014-10-20 | Disposition: A | Discharge status: Home / Self Care | Payer: Commercial Managed Care - HMO | Source: Ambulatory Visit | Attending: Ophthalmology | Admitting: Ophthalmology

## 2014-10-20 ENCOUNTER — Other Ambulatory Visit: Payer: Self-pay

## 2014-10-20 ENCOUNTER — Encounter (HOSPITAL_COMMUNITY): Payer: Self-pay

## 2014-10-20 DIAGNOSIS — H2511 Age-related nuclear cataract, right eye: Secondary | ICD-10-CM | POA: Diagnosis not present

## 2014-10-20 DIAGNOSIS — Z01818 Encounter for other preprocedural examination: Secondary | ICD-10-CM | POA: Insufficient documentation

## 2014-10-20 LAB — CBC WITH DIFFERENTIAL/PLATELET
Basophils Absolute: 0 10*3/uL (ref 0.0–0.1)
Basophils Relative: 0 % (ref 0–1)
Eosinophils Absolute: 0.1 10*3/uL (ref 0.0–0.7)
Eosinophils Relative: 1 % (ref 0–5)
HEMATOCRIT: 38.5 % (ref 36.0–46.0)
HEMOGLOBIN: 12.1 g/dL (ref 12.0–15.0)
LYMPHS PCT: 30 % (ref 12–46)
Lymphs Abs: 2.9 10*3/uL (ref 0.7–4.0)
MCH: 27.5 pg (ref 26.0–34.0)
MCHC: 31.4 g/dL (ref 30.0–36.0)
MCV: 87.5 fL (ref 78.0–100.0)
MONO ABS: 0.7 10*3/uL (ref 0.1–1.0)
Monocytes Relative: 7 % (ref 3–12)
NEUTROS PCT: 62 % (ref 43–77)
Neutro Abs: 5.8 10*3/uL (ref 1.7–7.7)
Platelets: 207 10*3/uL (ref 150–400)
RBC: 4.4 MIL/uL (ref 3.87–5.11)
RDW: 13.4 % (ref 11.5–15.5)
WBC: 9.5 10*3/uL (ref 4.0–10.5)

## 2014-10-20 LAB — BASIC METABOLIC PANEL
ANION GAP: 7 (ref 5–15)
BUN: 24 mg/dL — ABNORMAL HIGH (ref 6–20)
CHLORIDE: 101 mmol/L (ref 101–111)
CO2: 29 mmol/L (ref 22–32)
Calcium: 9.6 mg/dL (ref 8.9–10.3)
Creatinine, Ser: 1.17 mg/dL — ABNORMAL HIGH (ref 0.44–1.00)
GFR calc Af Amer: 55 mL/min — ABNORMAL LOW (ref >60)
GFR calc non Af Amer: 47 mL/min — ABNORMAL LOW (ref >60)
GLUCOSE: 122 mg/dL — AB (ref 65–99)
POTASSIUM: 3.6 mmol/L (ref 3.5–5.1)
Sodium: 137 mmol/L (ref 135–145)

## 2014-10-20 NOTE — Pre-Procedure Instructions (Signed)
Pt. Given info for [employer]. To be set up at home.

## 2014-10-21 ENCOUNTER — Other Ambulatory Visit: Payer: Self-pay | Admitting: Family Medicine

## 2014-10-24 MED ORDER — PHENYLEPHRINE HCL 2.5 % OP SOLN
OPHTHALMIC | Status: AC
  Filled 2014-10-24: qty 15

## 2014-10-24 MED ORDER — KETOROLAC TROMETHAMINE 0.5 % OP SOLN
OPHTHALMIC | Status: AC
  Filled 2014-10-24: qty 5

## 2014-10-24 MED ORDER — TETRACAINE HCL 0.5 % OP SOLN
OPHTHALMIC | Status: AC
  Filled 2014-10-24: qty 2

## 2014-10-24 MED ORDER — CYCLOPENTOLATE-PHENYLEPHRINE OP SOLN OPTIME - NO CHARGE
OPHTHALMIC | Status: AC
  Filled 2014-10-24: qty 2

## 2014-10-25 ENCOUNTER — Ambulatory Visit (HOSPITAL_COMMUNITY): Payer: Commercial Managed Care - HMO | Admitting: Anesthesiology

## 2014-10-25 ENCOUNTER — Encounter (HOSPITAL_COMMUNITY)
Admission: RE | Disposition: A | Discharge status: Home / Self Care | Payer: Self-pay | Source: Ambulatory Visit | Attending: Ophthalmology

## 2014-10-25 ENCOUNTER — Encounter (HOSPITAL_COMMUNITY): Payer: Self-pay | Admitting: *Deleted

## 2014-10-25 ENCOUNTER — Ambulatory Visit (HOSPITAL_COMMUNITY)
Admission: RE | Admit: 2014-10-25 | Discharge: 2014-10-25 | Disposition: A | Discharge status: Home / Self Care | Payer: Commercial Managed Care - HMO | Source: Ambulatory Visit | Attending: Ophthalmology | Admitting: Ophthalmology

## 2014-10-25 DIAGNOSIS — E119 Type 2 diabetes mellitus without complications: Secondary | ICD-10-CM | POA: Insufficient documentation

## 2014-10-25 DIAGNOSIS — I10 Essential (primary) hypertension: Secondary | ICD-10-CM | POA: Insufficient documentation

## 2014-10-25 DIAGNOSIS — Z79899 Other long term (current) drug therapy: Secondary | ICD-10-CM | POA: Diagnosis not present

## 2014-10-25 DIAGNOSIS — K219 Gastro-esophageal reflux disease without esophagitis: Secondary | ICD-10-CM | POA: Insufficient documentation

## 2014-10-25 DIAGNOSIS — F419 Anxiety disorder, unspecified: Secondary | ICD-10-CM | POA: Insufficient documentation

## 2014-10-25 DIAGNOSIS — H269 Unspecified cataract: Secondary | ICD-10-CM | POA: Diagnosis not present

## 2014-10-25 DIAGNOSIS — H2511 Age-related nuclear cataract, right eye: Secondary | ICD-10-CM | POA: Insufficient documentation

## 2014-10-25 DIAGNOSIS — H25011 Cortical age-related cataract, right eye: Secondary | ICD-10-CM | POA: Diagnosis not present

## 2014-10-25 HISTORY — PX: CATARACT EXTRACTION W/PHACO: SHX586

## 2014-10-25 LAB — GLUCOSE, CAPILLARY: Glucose-Capillary: 137 mg/dL — ABNORMAL HIGH (ref 65–99)

## 2014-10-25 SURGERY — PHACOEMULSIFICATION, CATARACT, WITH IOL INSERTION
Anesthesia: Monitor Anesthesia Care | Site: Eye | Laterality: Right

## 2014-10-25 MED ORDER — MIDAZOLAM HCL 2 MG/2ML IJ SOLN
INTRAMUSCULAR | Status: AC
  Filled 2014-10-25: qty 2

## 2014-10-25 MED ORDER — CYCLOPENTOLATE-PHENYLEPHRINE 0.2-1 % OP SOLN
1.0000 [drp] | OPHTHALMIC | Status: AC
  Administered 2014-10-25 (×3): 1 [drp] via OPHTHALMIC

## 2014-10-25 MED ORDER — TETRACAINE 0.5 % OP SOLN OPTIME - NO CHARGE
OPHTHALMIC | Status: DC | PRN
  Administered 2014-10-25: 1 [drp] via OPHTHALMIC

## 2014-10-25 MED ORDER — PROVISC 10 MG/ML IO SOLN
INTRAOCULAR | Status: DC | PRN
  Administered 2014-10-25: 0.85 mL via INTRAOCULAR

## 2014-10-25 MED ORDER — LACTATED RINGERS IV SOLN
INTRAVENOUS | Status: DC
Start: 2014-10-25 — End: 2014-10-25
  Administered 2014-10-25: 1000 mL via INTRAVENOUS

## 2014-10-25 MED ORDER — EPINEPHRINE HCL 1 MG/ML IJ SOLN
INTRAOCULAR | Status: DC | PRN
  Administered 2014-10-25: 500 mL

## 2014-10-25 MED ORDER — FENTANYL CITRATE (PF) 100 MCG/2ML IJ SOLN
25.0000 ug | INTRAMUSCULAR | Status: AC
  Administered 2014-10-25 (×2): 25 ug via INTRAVENOUS

## 2014-10-25 MED ORDER — KETOROLAC TROMETHAMINE 0.5 % OP SOLN
1.0000 [drp] | OPHTHALMIC | Status: AC
  Administered 2014-10-25 (×3): 1 [drp] via OPHTHALMIC

## 2014-10-25 MED ORDER — PHENYLEPHRINE HCL 2.5 % OP SOLN
1.0000 [drp] | OPHTHALMIC | Status: AC
  Administered 2014-10-25 (×3): 1 [drp] via OPHTHALMIC

## 2014-10-25 MED ORDER — TETRACAINE HCL 0.5 % OP SOLN
1.0000 [drp] | OPHTHALMIC | Status: AC
  Administered 2014-10-25 (×3): 1 [drp] via OPHTHALMIC

## 2014-10-25 MED ORDER — EPINEPHRINE HCL 1 MG/ML IJ SOLN
INTRAMUSCULAR | Status: AC
  Filled 2014-10-25: qty 1

## 2014-10-25 MED ORDER — BSS IO SOLN
INTRAOCULAR | Status: DC | PRN
  Administered 2014-10-25: 15 mL via INTRAOCULAR

## 2014-10-25 MED ORDER — MIDAZOLAM HCL 2 MG/2ML IJ SOLN
1.0000 mg | INTRAMUSCULAR | Status: DC | PRN
  Administered 2014-10-25: 2 mg via INTRAVENOUS

## 2014-10-25 MED ORDER — FENTANYL CITRATE (PF) 100 MCG/2ML IJ SOLN
INTRAMUSCULAR | Status: AC
  Filled 2014-10-25: qty 2

## 2014-10-25 SURGICAL SUPPLY — 25 items
CAPSULAR TENSION RING-AMO (OPHTHALMIC RELATED) IMPLANT
CLOTH BEACON ORANGE TIMEOUT ST (SAFETY) ×2 IMPLANT
EYE SHIELD UNIVERSAL CLEAR (GAUZE/BANDAGES/DRESSINGS) ×2 IMPLANT
GLOVE BIO SURGEON STRL SZ 6.5 (GLOVE) ×1 IMPLANT
GLOVE BIO SURGEONS STRL SZ 6.5 (GLOVE) ×1
GLOVE ECLIPSE 6.5 STRL STRAW (GLOVE) IMPLANT
GLOVE ECLIPSE 7.0 STRL STRAW (GLOVE) IMPLANT
GLOVE EXAM NITRILE LRG STRL (GLOVE) IMPLANT
GLOVE EXAM NITRILE MD LF STRL (GLOVE) ×2 IMPLANT
GLOVE SKINSENSE NS SZ6.5 (GLOVE)
GLOVE SKINSENSE STRL SZ6.5 (GLOVE) IMPLANT
HEALON 5 0.6 ML (INTRAOCULAR LENS) IMPLANT
KIT VITRECTOMY (OPHTHALMIC RELATED) IMPLANT
LENS ALC ACRYL/TECN (Ophthalmic Related) ×3 IMPLANT
PAD ARMBOARD 7.5X6 YLW CONV (MISCELLANEOUS) ×2 IMPLANT
PROC W NO LENS (INTRAOCULAR LENS)
PROC W SPEC LENS (INTRAOCULAR LENS)
PROCESS W NO LENS (INTRAOCULAR LENS) IMPLANT
PROCESS W SPEC LENS (INTRAOCULAR LENS) IMPLANT
RETRACTOR IRIS SIGHTPATH (OPHTHALMIC RELATED) IMPLANT
RING MALYGIN (MISCELLANEOUS) IMPLANT
TAPE SURG TRANSPORE 1 IN (GAUZE/BANDAGES/DRESSINGS) IMPLANT
TAPE SURGICAL TRANSPORE 1 IN (GAUZE/BANDAGES/DRESSINGS) ×2
VISCOELASTIC ADDITIONAL (OPHTHALMIC RELATED) IMPLANT
WATER STERILE IRR 250ML POUR (IV SOLUTION) ×2 IMPLANT

## 2014-10-25 NOTE — H&P (Signed)
The patient was re examined and there is no change in the patients condition since the original H and P. 

## 2014-10-25 NOTE — Transfer of Care (Signed)
Immediate Anesthesia Transfer of Care Note  Patient: Tonya Carpenter  Procedure(s) Performed: Procedure(s): CATARACT EXTRACTION PHACO AND INTRAOCULAR LENS PLACEMENT; CDE:  17.28 (Right)  Patient Location: Short Stay  Anesthesia Type:MAC  Level of Consciousness: awake, alert , oriented and patient cooperative  Airway & Oxygen Therapy: Patient Spontanous Breathing  Post-op Assessment: Report given to RN, Post -op Vital signs reviewed and stable and Patient moving all extremities  Post vital signs: Reviewed and stable  Last Vitals:  Filed Vitals:   10/25/14 0745  BP: 118/55  Pulse:   Temp:   Resp: 13    Complications: No apparent anesthesia complications

## 2014-10-25 NOTE — Anesthesia Preprocedure Evaluation (Signed)
Anesthesia Evaluation  Patient identified by MRN, date of birth, ID band Patient awake    Reviewed: Allergy & Precautions, NPO status , Patient's Chart, lab work & pertinent test results, reviewed documented beta blocker date and time   Airway Mallampati: III  TM Distance: >3 FB   Mouth opening: Limited Mouth Opening  Dental  (+) Edentulous Upper, Poor Dentition   Pulmonary shortness of breath, asthma ,  breath sounds clear to auscultation        Cardiovascular hypertension, Pt. on medications and Pt. on home beta blockers + CAD and + Past MI Rhythm:Regular Rate:Normal     Neuro/Psych Anxiety CVA    GI/Hepatic GERD-  ,  Endo/Other  diabetes, Type 2, Oral Hypoglycemic Agents  Renal/GU      Musculoskeletal   Abdominal   Peds  Hematology   Anesthesia Other Findings   Reproductive/Obstetrics                             Anesthesia Physical Anesthesia Plan  ASA: III  Anesthesia Plan: MAC   Post-op Pain Management:    Induction: Intravenous  Airway Management Planned: Nasal Cannula  Additional Equipment:   Intra-op Plan:   Post-operative Plan:   Informed Consent:   Plan Discussed with:   Anesthesia Plan Comments:         Anesthesia Quick Evaluation

## 2014-10-25 NOTE — Anesthesia Postprocedure Evaluation (Signed)
  Anesthesia Post-op Note  Patient: Tonya Carpenter  Procedure(s) Performed: Procedure(s): CATARACT EXTRACTION PHACO AND INTRAOCULAR LENS PLACEMENT; CDE:  17.28 (Right)  Patient Location: Short Stay  Anesthesia Type:MAC  Level of Consciousness: awake, alert , oriented and patient cooperative  Airway and Oxygen Therapy: Patient Spontanous Breathing  Post-op Pain: none  Post-op Assessment: Post-op Vital signs reviewed, Patient's Cardiovascular Status Stable, Respiratory Function Stable, Patent Airway, Adequate PO intake and Pain level controlled              Post-op Vital Signs: Reviewed and stable  Last Vitals:  Filed Vitals:   10/25/14 0745  BP: 118/55  Pulse:   Temp:   Resp: 13    Complications: No apparent anesthesia complications

## 2014-10-25 NOTE — Op Note (Signed)
Patient brought to the operating room and prepped and draped in the usual manner.  Lid speculum inserted in right eye.  Stab incision made at the twelve o'clock position.  Provisc instilled in the anterior chamber.   A 2.4 mm. Stab incision was made temporally.  An anterior capsulotomy was done with a bent 25 gauge needle.  The nucleus was hydrodissected.  The Phaco tip was inserted in the anterior chamber and the nucleus was emulsified.  CDE was 17.28.  The cortical material was then removed with the I and A tip.  Posterior capsule was the polished.  The anterior chamber was deepened with Provisc.  A 11.5 Diopter Alcon SN60WF IOL was then inserted in the capsular bag.  Provisc was then removed with the I and A tip.  The wound was then hydrated.  Patient sent to the Recovery Room in good condition with follow up in my office.  Preoperative Diagnosis:  Nuclear Cataract OD Postoperative Diagnosis:  Same Procedure name: Kelman Phacoemulsification OD with IOL

## 2014-10-26 ENCOUNTER — Encounter (HOSPITAL_COMMUNITY): Payer: Self-pay | Admitting: Ophthalmology

## 2014-11-01 ENCOUNTER — Encounter (HOSPITAL_COMMUNITY)
Admission: RE | Admit: 2014-11-01 | Discharge: 2014-11-01 | Disposition: A | Discharge status: Home / Self Care | Payer: Commercial Managed Care - HMO | Source: Ambulatory Visit | Attending: Ophthalmology | Admitting: Ophthalmology

## 2014-11-01 DIAGNOSIS — H2512 Age-related nuclear cataract, left eye: Secondary | ICD-10-CM | POA: Diagnosis not present

## 2014-11-01 DIAGNOSIS — H25012 Cortical age-related cataract, left eye: Secondary | ICD-10-CM | POA: Diagnosis not present

## 2014-11-04 ENCOUNTER — Encounter (HOSPITAL_COMMUNITY): Payer: Self-pay

## 2014-11-07 MED ORDER — PHENYLEPHRINE HCL 2.5 % OP SOLN
OPHTHALMIC | Status: AC
Start: 2014-11-07 — End: 2014-11-07
  Filled 2014-11-07: qty 15

## 2014-11-07 MED ORDER — CYCLOPENTOLATE-PHENYLEPHRINE OP SOLN OPTIME - NO CHARGE
OPHTHALMIC | Status: AC
Start: 2014-11-07 — End: 2014-11-07
  Filled 2014-11-07: qty 2

## 2014-11-07 MED ORDER — TETRACAINE HCL 0.5 % OP SOLN
OPHTHALMIC | Status: AC
  Filled 2014-11-07: qty 2

## 2014-11-07 MED ORDER — KETOROLAC TROMETHAMINE 0.5 % OP SOLN
OPHTHALMIC | Status: AC
  Filled 2014-11-07: qty 5

## 2014-11-08 ENCOUNTER — Ambulatory Visit (HOSPITAL_COMMUNITY): Payer: Commercial Managed Care - HMO | Admitting: Anesthesiology

## 2014-11-08 ENCOUNTER — Encounter (HOSPITAL_COMMUNITY): Payer: Self-pay | Admitting: *Deleted

## 2014-11-08 ENCOUNTER — Encounter (HOSPITAL_COMMUNITY)
Admission: RE | Disposition: A | Discharge status: Home / Self Care | Payer: Self-pay | Source: Ambulatory Visit | Attending: Ophthalmology

## 2014-11-08 ENCOUNTER — Ambulatory Visit (HOSPITAL_COMMUNITY)
Admission: RE | Admit: 2014-11-08 | Discharge: 2014-11-08 | Disposition: A | Discharge status: Home / Self Care | Payer: Commercial Managed Care - HMO | Source: Ambulatory Visit | Attending: Ophthalmology | Admitting: Ophthalmology

## 2014-11-08 DIAGNOSIS — I10 Essential (primary) hypertension: Secondary | ICD-10-CM | POA: Insufficient documentation

## 2014-11-08 DIAGNOSIS — Z7982 Long term (current) use of aspirin: Secondary | ICD-10-CM | POA: Insufficient documentation

## 2014-11-08 DIAGNOSIS — E119 Type 2 diabetes mellitus without complications: Secondary | ICD-10-CM | POA: Insufficient documentation

## 2014-11-08 DIAGNOSIS — Z79899 Other long term (current) drug therapy: Secondary | ICD-10-CM | POA: Diagnosis not present

## 2014-11-08 DIAGNOSIS — Z7902 Long term (current) use of antithrombotics/antiplatelets: Secondary | ICD-10-CM | POA: Diagnosis not present

## 2014-11-08 DIAGNOSIS — H2512 Age-related nuclear cataract, left eye: Secondary | ICD-10-CM | POA: Diagnosis not present

## 2014-11-08 DIAGNOSIS — H25012 Cortical age-related cataract, left eye: Secondary | ICD-10-CM | POA: Diagnosis not present

## 2014-11-08 DIAGNOSIS — H269 Unspecified cataract: Secondary | ICD-10-CM | POA: Diagnosis not present

## 2014-11-08 DIAGNOSIS — I252 Old myocardial infarction: Secondary | ICD-10-CM | POA: Diagnosis not present

## 2014-11-08 HISTORY — PX: CATARACT EXTRACTION W/PHACO: SHX586

## 2014-11-08 LAB — GLUCOSE, CAPILLARY: GLUCOSE-CAPILLARY: 137 mg/dL — AB (ref 65–99)

## 2014-11-08 SURGERY — PHACOEMULSIFICATION, CATARACT, WITH IOL INSERTION
Anesthesia: Monitor Anesthesia Care | Site: Eye | Laterality: Left

## 2014-11-08 MED ORDER — PROVISC 10 MG/ML IO SOLN
INTRAOCULAR | Status: DC | PRN
  Administered 2014-11-08: 0.85 mL via INTRAOCULAR

## 2014-11-08 MED ORDER — EPINEPHRINE HCL 1 MG/ML IJ SOLN
INTRAMUSCULAR | Status: AC
  Filled 2014-11-08: qty 1

## 2014-11-08 MED ORDER — MIDAZOLAM HCL 2 MG/2ML IJ SOLN
1.0000 mg | INTRAMUSCULAR | Status: DC | PRN
  Administered 2014-11-08: 2 mg via INTRAVENOUS

## 2014-11-08 MED ORDER — EPINEPHRINE HCL 1 MG/ML IJ SOLN
INTRAOCULAR | Status: DC | PRN
  Administered 2014-11-08: 500 mL

## 2014-11-08 MED ORDER — CYCLOPENTOLATE-PHENYLEPHRINE 0.2-1 % OP SOLN
1.0000 [drp] | OPHTHALMIC | Status: AC
  Administered 2014-11-08 (×3): 1 [drp] via OPHTHALMIC

## 2014-11-08 MED ORDER — GLYCOPYRROLATE 0.2 MG/ML IJ SOLN: 0.1000 mg | Freq: Once | INTRAMUSCULAR | Status: DC

## 2014-11-08 MED ORDER — PHENYLEPHRINE HCL 2.5 % OP SOLN
1.0000 [drp] | OPHTHALMIC | Status: AC
  Administered 2014-11-08 (×3): 1 [drp] via OPHTHALMIC

## 2014-11-08 MED ORDER — GLYCOPYRROLATE 0.2 MG/ML IJ SOLN
0.2000 mg | Freq: Once | INTRAMUSCULAR | Status: AC
  Administered 2014-11-08: 0.2 mg via INTRAVENOUS

## 2014-11-08 MED ORDER — TETRACAINE HCL 0.5 % OP SOLN
1.0000 [drp] | OPHTHALMIC | Status: AC
  Administered 2014-11-08 (×3): 1 [drp] via OPHTHALMIC

## 2014-11-08 MED ORDER — BSS IO SOLN
INTRAOCULAR | Status: DC | PRN
  Administered 2014-11-08: 15 mL

## 2014-11-08 MED ORDER — KETOROLAC TROMETHAMINE 0.5 % OP SOLN
1.0000 [drp] | OPHTHALMIC | Status: AC
  Administered 2014-11-08 (×3): 1 [drp] via OPHTHALMIC

## 2014-11-08 MED ORDER — GLYCOPYRROLATE 0.2 MG/ML IJ SOLN
INTRAMUSCULAR | Status: AC
  Filled 2014-11-08: qty 1

## 2014-11-08 MED ORDER — TETRACAINE 0.5 % OP SOLN OPTIME - NO CHARGE
OPHTHALMIC | Status: DC | PRN
  Administered 2014-11-08: 1 [drp] via OPHTHALMIC

## 2014-11-08 MED ORDER — LACTATED RINGERS IV SOLN
INTRAVENOUS | Status: DC
  Administered 2014-11-08: 10:00:00 via INTRAVENOUS

## 2014-11-08 MED ORDER — FENTANYL CITRATE (PF) 100 MCG/2ML IJ SOLN
INTRAMUSCULAR | Status: AC
  Filled 2014-11-08: qty 2

## 2014-11-08 MED ORDER — MIDAZOLAM HCL 2 MG/2ML IJ SOLN
INTRAMUSCULAR | Status: AC
  Filled 2014-11-08: qty 2

## 2014-11-08 MED ORDER — FENTANYL CITRATE (PF) 100 MCG/2ML IJ SOLN
25.0000 ug | INTRAMUSCULAR | Status: AC
  Administered 2014-11-08 (×2): 25 ug via INTRAVENOUS

## 2014-11-08 SURGICAL SUPPLY — 10 items

## 2014-11-08 NOTE — H&P (Signed)
The patient was re examined and there is no change in the patients condition since the original H and P. 

## 2014-11-08 NOTE — Transfer of Care (Signed)
Immediate Anesthesia Transfer of Care Note  Patient: Tonya Carpenter  Procedure(s) Performed: Procedure(s) with comments: CATARACT EXTRACTION PHACO AND INTRAOCULAR LENS PLACEMENT (IOC) (Left) - CDE: 5.94  Patient Location: Short Stay  Anesthesia Type:MAC  Level of Consciousness: awake  Airway & Oxygen Therapy: Patient Spontanous Breathing  Post-op Assessment: Report given to RN  Post vital signs: Reviewed  Last Vitals:  Filed Vitals:   11/08/14 1000  BP: 128/53  Resp: 23    Complications: No apparent anesthesia complications

## 2014-11-08 NOTE — Op Note (Signed)
Patient brought to the operating room and prepped and draped in the usual manner. Lid speculum inserted in left eye. Stab incision made at the twelve o'clock position. Provisc instilled in the anterior chamber. A 2.4 mm. Stab incision was made temporally. An anterior capsulotomy was done with a bent 25 gauge needle. The nucleus was hydrodissected. The Phaco tip was inserted in the anterior chamber and the nucleus was emulsified. CDE was 5.94. The cortical material was then removed with the I and A tip. Posterior capsule was the polished. The anterior chamber was deepened with Provisc. A 11.5 Diopter Alcon SN60WF IOL was then inserted in the capsular bag. Provisc was then removed with the I and A tip. The wound was then hydrated. Patient sent to the Recovery Room in good condition with follow up in my office.  Preoperative Diagnosis: Nuclear Cataract OS  Postoperative Diagnosis: Same  Procedure name: Kelman Phacoemulsification OS with IOL

## 2014-11-08 NOTE — Discharge Instructions (Signed)
Cataract Surgery °Care After °Refer to this sheet in the next few weeks. These instructions provide you with information on caring for yourself after your procedure. Your caregiver may also give you more specific instructions. Your treatment has been planned according to current medical practices, but problems sometimes occur. Call your caregiver if you have any problems or questions after your procedure.  °HOME CARE INSTRUCTIONS  °· Avoid strenuous activities as directed by your caregiver. °· Ask your caregiver when you can resume driving. °· Use eyedrops or other medicines to help healing and control pressure inside your eye as directed by your caregiver. °· Only take over-the-counter or prescription medicines for pain, discomfort, or fever as directed by your caregiver. °· Do not to touch or rub your eyes. °· You may be instructed to use a protective shield during the first few days and nights after surgery. If not, wear sunglasses to protect your eyes. This is to protect the eye from pressure or from being accidentally bumped. °· Keep the area around your eye clean and dry. Avoid swimming or allowing water to hit you directly in the face while showering. Keep soap and shampoo out of your eyes. °· Do not bend or lift heavy objects. Bending increases pressure in the eye. You can walk, climb stairs, and do light household chores. °· Do not put a contact lens into the eye that had surgery until your caregiver says it is okay to do so. °· Ask your doctor when you can return to work. This will depend on the kind of work that you do. If you work in a dusty environment, you may be advised to wear protective eyewear for a period of time. °· Ask your caregiver when it will be safe to engage in sexual activity. °· Continue with your regular eye exams as directed by your caregiver. °What to expect: °· It is normal to feel itching and mild discomfort for a few days after cataract surgery. Some fluid discharge is also common,  and your eye may be sensitive to light and touch. °· After 1 to 2 days, even moderate discomfort should disappear. In most cases, healing will take about 6 weeks. °· If you received an intraocular lens (IOL), you may notice that colors are very bright or have a blue tinge. Also, if you have been in bright sunlight, everything may appear reddish for a few hours. If you see these color tinges, it is because your lens is clear and no longer cloudy. Within a few months after receiving an IOL, these extra colors should go away. When you have healed, you will probably need new glasses. °SEEK MEDICAL CARE IF:  °· You have increased bruising around your eye. °· You have discomfort not helped by medicine. °SEEK IMMEDIATE MEDICAL CARE IF:  °· You have a  fever. °· You have a worsening or sudden vision loss. °· You have redness, swelling, or increasing pain in the eye. °· You have a thick discharge from the eye that had surgery. °MAKE SURE YOU: °· Understand these instructions. °· Will watch your condition. °· Will get help right away if you are not doing well or get worse. °Document Released: 09/21/2004 Document Revised: 05/27/2011 Document Reviewed: 10/26/2010 °[employer]® Patient Information ©2015 [employer]. This information is not intended to replace advice given to you by your health care provider. Make sure you discuss any questions you have with your health care provider. ° °

## 2014-11-08 NOTE — Anesthesia Postprocedure Evaluation (Signed)
  Anesthesia Post-op Note  Patient: Tonya Carpenter  Procedure(s) Performed: Procedure(s) with comments: CATARACT EXTRACTION PHACO AND INTRAOCULAR LENS PLACEMENT (IOC) (Left) - CDE: 5.94  Patient Location: Short Stay  Anesthesia Type:MAC  Level of Consciousness: awake, alert  and oriented  Airway and Oxygen Therapy: Patient Spontanous Breathing  Post-op Pain: none  Post-op Assessment: Post-op Vital signs reviewed, Patient's Cardiovascular Status Stable, Respiratory Function Stable, Patent Airway and No signs of Nausea or vomiting              Post-op Vital Signs: Reviewed and stable  Last Vitals:  Filed Vitals:   11/08/14 1000  BP: 128/53  Resp: 23    Complications: No apparent anesthesia complications

## 2014-11-08 NOTE — Anesthesia Preprocedure Evaluation (Signed)
Anesthesia Evaluation  Patient identified by MRN, date of birth, ID band Patient awake    Reviewed: Allergy & Precautions, NPO status , Patient's Chart, lab work & pertinent test results, reviewed documented beta blocker date and time   Airway Mallampati: III  TM Distance: >3 FB   Mouth opening: Limited Mouth Opening  Dental  (+) Edentulous Upper, Poor Dentition   Pulmonary shortness of breath, asthma ,  breath sounds clear to auscultation        Cardiovascular hypertension, Pt. on medications and Pt. on home beta blockers + CAD and + Past MI Rhythm:Regular Rate:Normal     Neuro/Psych Anxiety CVA    GI/Hepatic GERD-  ,  Endo/Other  diabetes, Type 2, Oral Hypoglycemic Agents  Renal/GU      Musculoskeletal   Abdominal   Peds  Hematology   Anesthesia Other Findings   Reproductive/Obstetrics                             Anesthesia Physical Anesthesia Plan  ASA: III  Anesthesia Plan: MAC   Post-op Pain Management:    Induction: Intravenous  Airway Management Planned: Nasal Cannula  Additional Equipment:   Intra-op Plan:   Post-operative Plan:   Informed Consent: I have reviewed the patients History and Physical, chart, labs and discussed the procedure including the risks, benefits and alternatives for the proposed anesthesia with the patient or authorized representative who has indicated his/her understanding and acceptance.     Plan Discussed with:   Anesthesia Plan Comments:         Anesthesia Quick Evaluation

## 2014-11-09 ENCOUNTER — Encounter (HOSPITAL_COMMUNITY): Payer: Self-pay | Admitting: Ophthalmology

## 2014-11-30 ENCOUNTER — Other Ambulatory Visit: Payer: Self-pay | Admitting: Family

## 2014-11-30 ENCOUNTER — Other Ambulatory Visit: Payer: Self-pay | Admitting: Family Medicine

## 2014-12-05 DIAGNOSIS — Z1231 Encounter for screening mammogram for malignant neoplasm of breast: Secondary | ICD-10-CM | POA: Diagnosis not present

## 2014-12-06 ENCOUNTER — Other Ambulatory Visit: Payer: Self-pay | Admitting: Family Medicine

## 2014-12-06 ENCOUNTER — Other Ambulatory Visit: Payer: Self-pay | Admitting: Family

## 2014-12-15 ENCOUNTER — Other Ambulatory Visit: Payer: Self-pay | Admitting: Family Medicine

## 2014-12-16 ENCOUNTER — Telehealth: Payer: Self-pay | Admitting: Family Medicine

## 2014-12-16 NOTE — Telephone Encounter (Signed)
No answer

## 2014-12-21 NOTE — Telephone Encounter (Signed)
Multiple attempts made to contact patient.  This encounter will now be closed  

## 2014-12-22 ENCOUNTER — Encounter: Payer: Self-pay | Admitting: Family Medicine

## 2015-01-03 ENCOUNTER — Ambulatory Visit (INDEPENDENT_AMBULATORY_CARE_PROVIDER_SITE_OTHER): Payer: Commercial Managed Care - HMO | Admitting: Family Medicine

## 2015-01-03 ENCOUNTER — Encounter: Payer: Self-pay | Admitting: Family Medicine

## 2015-01-03 VITALS — BP 139/72 | HR 55 | Temp 96.9°F | Ht 63.0 in | Wt 154.6 lb

## 2015-01-03 DIAGNOSIS — E119 Type 2 diabetes mellitus without complications: Secondary | ICD-10-CM | POA: Diagnosis not present

## 2015-01-03 DIAGNOSIS — Z23 Encounter for immunization: Secondary | ICD-10-CM

## 2015-01-03 LAB — POCT GLYCOSYLATED HEMOGLOBIN (HGB A1C): HEMOGLOBIN A1C: 7.5

## 2015-01-03 NOTE — Progress Notes (Signed)
Subjective:    Patient ID: Tonya Carpenter, female    DOB: 03/14/1946, 69 y.o.   MRN: [id number]  HPI 69 year old female here to follow-up diabetes and hypertension. She brings in a record of her blood sugars and generally they've been good. Her last A1c was 7.6 in July. She complains about her number medicines but it's really not that many compared to some. I think 1 of her medicines hydralazine that she has to take 4 times a day makes it feel like she is on a lot more than she is.  Patient Active Problem List   Diagnosis Date Noted  . GERD (gastroesophageal reflux disease) 10/20/2013  . Frequency 02/18/2013  . Chest pain 01/21/2013  . Precordial pain 01/15/2013  . Coronary atherosclerosis of native coronary artery 01/14/2013  . CAD, multiple vessel 07/10/2012  . CHEST PAIN UNSPECIFIED 09/04/2009  . CARDIAC MURMUR 07/03/2009  . Type 2 diabetes mellitus (HCC) 09/12/2008  . Hyperlipidemia 09/12/2008  . Essential hypertension 09/12/2008   Outpatient Encounter Prescriptions as of 01/03/2015  Medication Sig  . aspirin 81 MG tablet Take 1 tablet (81 mg total) by mouth daily.  Marland Kitchen. atorvastatin (LIPITOR) 20 MG tablet TAKE 1 TABLET EVERY DAY  . cholecalciferol (VITAMIN D) 1000 UNITS tablet   . clopidogrel (PLAVIX) 75 MG tablet TAKE 1 TABLET EVERY DAY  . glimepiride (AMARYL) 4 MG tablet TAKE 1 TABLET IN THE MORNING BEFORE A MEAL  . hydrALAZINE (APRESOLINE) 10 MG tablet TAKE 1 TABLET FOUR TIMES DAILY  . Liniments (ORTHOGEL) GEL Apply 1 application topically daily as needed (pain).   . metFORMIN (GLUCOPHAGE) 500 MG tablet TAKE 1 TABLET TWICE DAILY WITH MEALS  . metoprolol succinate (TOPROL-XL) 100 MG 24 hr tablet TAKE 1 TABLET (100 MG TOTAL) BY MOUTH DAILY. TAKE WITH OR IMMEDIATELY FOLLOWING A MEAL.  . nitroGLYCERIN (NITROSTAT) 0.4 MG SL tablet Place 1 tablet (0.4 mg total) under the tongue every 5 (five) minutes as needed. For chest pain  . omeprazole (PRILOSEC) 40 MG capsule TAKE 1 CAPSULE (40 MG  TOTAL) BY MOUTH DAILY.  Marland Kitchen. polyethylene glycol powder (GLYCOLAX/MIRALAX) powder Take 17 g by mouth 2 (two) times daily as needed.  . quinapril-hydrochlorothiazide (ACCURETIC) 20-12.5 MG tablet TAKE 1 TABLET BY MOUTH DAILY.  Marland Kitchen. Alcohol Swabs (B-D SINGLE USE SWABS REGULAR) PADS TEST TWICE A DAY    No facility-administered encounter medications on file as of 01/03/2015.       Review of Systems  Constitutional: Negative.   Respiratory: Negative.   Cardiovascular: Negative.   Gastrointestinal: Negative.   Genitourinary: Negative.   Neurological: Negative.   Psychiatric/Behavioral: Negative.        Objective:   Physical Exam  Constitutional: She is oriented to person, place, and time. She appears well-developed and well-nourished.  Cardiovascular: Normal rate, regular rhythm and normal heart sounds.   Pulmonary/Chest: Effort normal and breath sounds normal.  Musculoskeletal: Normal range of motion.  Neurological: She is alert and oriented to person, place, and time.  Psychiatric: She has a normal mood and affect. Thought content normal.          Assessment & Plan:  1. Type 2 diabetes mellitus without complication, without long-term current use of insulin (HCC) A1c is 7.5 today. I'm [name]. - POCT UA - Microalbumin - POCT glycosylated hemoglobin (Hb A1C)  2. Encounter for immunization Flu shot given   2. Essential hypertension Blood pressure okay on ace inhibitor beta blocker and alpha-blocker  Jeannett SeniorStephen  Loleta Chance MD

## 2015-02-17 ENCOUNTER — Other Ambulatory Visit: Payer: Self-pay | Admitting: Family Medicine

## 2015-03-06 ENCOUNTER — Other Ambulatory Visit: Payer: Self-pay | Admitting: Family Medicine

## 2015-03-07 DIAGNOSIS — Z961 Presence of intraocular lens: Secondary | ICD-10-CM | POA: Diagnosis not present

## 2015-03-09 ENCOUNTER — Other Ambulatory Visit: Payer: Self-pay | Admitting: Family Medicine

## 2015-03-21 ENCOUNTER — Encounter: Payer: Self-pay | Admitting: *Deleted

## 2015-04-03 ENCOUNTER — Other Ambulatory Visit: Payer: Self-pay | Admitting: Family Medicine

## 2015-05-08 ENCOUNTER — Ambulatory Visit: Payer: Self-pay | Admitting: Family Medicine

## 2015-05-09 ENCOUNTER — Encounter: Payer: Self-pay | Admitting: Family Medicine

## 2015-05-09 ENCOUNTER — Ambulatory Visit (INDEPENDENT_AMBULATORY_CARE_PROVIDER_SITE_OTHER): Payer: Commercial Managed Care - HMO | Admitting: Family Medicine

## 2015-05-09 VITALS — BP 116/68 | HR 64 | Temp 96.7°F | Ht 63.0 in | Wt 155.0 lb

## 2015-05-09 DIAGNOSIS — E785 Hyperlipidemia, unspecified: Secondary | ICD-10-CM

## 2015-05-09 DIAGNOSIS — I10 Essential (primary) hypertension: Secondary | ICD-10-CM | POA: Diagnosis not present

## 2015-05-09 DIAGNOSIS — E119 Type 2 diabetes mellitus without complications: Secondary | ICD-10-CM | POA: Diagnosis not present

## 2015-05-09 DIAGNOSIS — K2100 Gastro-esophageal reflux disease with esophagitis, without bleeding: Secondary | ICD-10-CM

## 2015-05-09 LAB — POCT GLYCOSYLATED HEMOGLOBIN (HGB A1C): HEMOGLOBIN A1C: 8

## 2015-05-09 NOTE — Patient Instructions (Signed)
Medicare Annual Wellness Visit  [place] and the medical providers at [facility] strive to bring you the best medical care.  In doing so we not only want to address your current medical conditions and concerns but also to detect new conditions early and prevent illness, disease and health-related problems.    Medicare offers a yearly Wellness Visit which allows our clinical staff to assess your need for preventative services including immunizations, lifestyle education, counseling to decrease risk of preventable diseases and screening for fall risk and other medical concerns.    This visit is provided free of charge (no copay) for all Medicare recipients. The clinical pharmacists at [facility] have begun to conduct these Wellness Visits which will also include a thorough review of all your medications.    As you primary medical provider recommend that you make an appointment for your Annual Wellness Visit if you have not done so already this year.  You may set up this appointment before you leave today or you may call back ([phone]) and schedule an appointment.  Please make sure when you call that you mention that you are scheduling your Annual Wellness Visit with the clinical pharmacist so that the appointment may be made for the proper length of time.     Continue current medications. Continue good therapeutic lifestyle changes which include good diet and exercise. Fall precautions discussed with patient. If an FOBT was given today- please return it to our front desk. If you are over 50 years old - you may need Prevnar 13 or the adult Pneumonia vaccine.  **Flu shots are available--- please call and schedule a FLU-CLINIC appointment**  After your visit with us today you will receive a survey in the mail or online from [employer] regarding your care with us. Please take a moment to fill this out. Your feedback is very  important to us as you can help us better understand your patient needs as well as improve your experience and satisfaction. WE CARE ABOUT YOU!!!    

## 2015-05-09 NOTE — Progress Notes (Signed)
Subjective:    Patient ID: [name], female    DOB: 07-24-1945, 70 y.o.   MRN: [id number]  HPI Pt here for follow up and management of chronic medical problems which includes diabetes, hypertension and hyperlipidemia. She is taking medications regularly. She is concerned today about a knot on the back of her head which on exam reveals this just part of her skull. There are no cysts palpable. Generally she has no specific complaints. Regarding her diabetes A1c was 7.54 months ago. She takes metformin and glimepiride. She takes hydralazine 4 times a day but she also takes metoprolol and quinapril for blood pressure she would like to come off of the hydralazine and I am in agreement if she has no symptoms doing so.      Patient Active Problem List   Diagnosis Date Noted  . GERD (gastroesophageal reflux disease) 10/20/2013  . Frequency 02/18/2013  . Chest pain 01/21/2013  . Precordial pain 01/15/2013  . Coronary atherosclerosis of native coronary artery 01/14/2013  . CAD, multiple vessel 07/10/2012  . CHEST PAIN UNSPECIFIED 09/04/2009  . CARDIAC MURMUR 07/03/2009  . Type 2 diabetes mellitus ([place]) 09/12/2008  . Hyperlipidemia 09/12/2008  . Essential hypertension 09/12/2008   Outpatient Encounter Prescriptions as of 05/09/2015  Medication Sig  . Alcohol Swabs (B-D SINGLE USE SWABS REGULAR) PADS TEST TWICE A DAY   . aspirin 81 MG tablet Take 1 tablet (81 mg total) by mouth daily.  Marland Kitchen atorvastatin (LIPITOR) 20 MG tablet TAKE 1 TABLET EVERY DAY  . cholecalciferol (VITAMIN D) 1000 UNITS tablet   . glimepiride (AMARYL) 4 MG tablet TAKE 1 TABLET EVERY MORNING BEFORE A MEAL  . hydrALAZINE (APRESOLINE) 10 MG tablet TAKE 1 TABLET FOUR TIMES DAILY  . Liniments (ORTHOGEL) GEL Apply 1 application topically daily as needed (pain).   . metFORMIN (GLUCOPHAGE) 500 MG tablet TAKE 1 TABLET TWICE DAILY WITH MEALS  . metoprolol succinate (TOPROL-XL) 100 MG 24 hr tablet TAKE 1 TABLET (100 MG TOTAL)  DAILY. TAKE WITH OR IMMEDIATELY FOLLOWING A MEAL.  . nitroGLYCERIN (NITROSTAT) 0.4 MG SL tablet Place 1 tablet (0.4 mg total) under the tongue every 5 (five) minutes as needed. For chest pain  . omeprazole (PRILOSEC) 40 MG capsule TAKE 1 CAPSULE EVERY DAY  . polyethylene glycol powder (GLYCOLAX/MIRALAX) powder Take 17 g by mouth 2 (two) times daily as needed.  Marland Kitchen PRODIGY NO CODING BLOOD GLUC test strip TEST  BLOOD  SUGAR TWICE DAILY  . quinapril-hydrochlorothiazide (ACCURETIC) 20-12.5 MG tablet TAKE 1 TABLET EVERY DAY  . [DISCONTINUED] clopidogrel (PLAVIX) 75 MG tablet TAKE 1 TABLET EVERY DAY  . [DISCONTINUED] glimepiride (AMARYL) 4 MG tablet TAKE 1 TABLET IN THE MORNING BEFORE A MEAL  . [DISCONTINUED] quinapril-hydrochlorothiazide (ACCURETIC) 20-12.5 MG tablet TAKE 1 TABLET EVERY DAY   No facility-administered encounter medications on file as of 05/09/2015.      Review of Systems  Constitutional: Negative.   HENT: Negative.   Eyes: Negative.   Respiratory: Negative.   Cardiovascular: Negative.   Gastrointestinal: Negative.   Endocrine: Negative.   Genitourinary: Negative.   Musculoskeletal: Negative.   Skin: Negative.        Knot in back of head  Allergic/Immunologic: Negative.   Neurological: Negative.   Hematological: Negative.   Psychiatric/Behavioral: Negative.        Objective:   Physical Exam  Constitutional: She is oriented to person, place, and time. She appears well-developed and well-nourished.  HENT:  Mouth/Throat: Oropharynx is clear and moist.  Cardiovascular: Normal rate, regular rhythm, normal heart sounds and intact distal pulses.   Pulmonary/Chest: Effort normal and breath sounds normal.  Abdominal: Soft.  Musculoskeletal: Normal range of motion.  Neurological: She is alert and oriented to person, place, and time.  Psychiatric: She has a normal mood and affect. Her behavior is normal.   BP 116/68 mmHg  Pulse 64  Temp(Src) 96.7 F (35.9 C) (Oral)  Ht 5'  3" (1.6 m)  Wt 155 lb (70.308 kg)  BMI 27.46 kg/m2        Assessment & Plan:  1. Type 2 diabetes mellitus without complication, without long-term current use of insulin (HCC) We'll continue with oral medications. Even though A1c was not at goal as long as it's around 7-7.5 think that is about as good as we can do. - POCT glycosylated hemoglobin (Hb A1C)  2. Hyperlipidemia Lipids were at goal when checked last summer with LDL of 61. She has no issues with statin - Lipid panel  3. Essential hypertension Pressure is well controlled with ACE inhibitor and beta blocker. Will try to taper her off hydralazine at 1 pill per week. If she has symptoms as we discussed such as shortness of breath and dependent edema may need to stop the taper that point - CMP14+EGFR  4. Gastroesophageal reflux disease with esophagitis Continue with omeprazole  Wardell Honour MD

## 2015-05-10 LAB — LIPID PANEL
Chol/HDL Ratio: 2.3 ratio units (ref 0.0–4.4)
Cholesterol, Total: 141 mg/dL (ref 100–199)
HDL: 62 mg/dL (ref >39)
LDL Calculated: 63 mg/dL (ref 0–99)
Triglycerides: 78 mg/dL (ref 0–149)
VLDL CHOLESTEROL CAL: 16 mg/dL (ref 5–40)

## 2015-05-10 LAB — CMP14+EGFR
ALBUMIN: 4.1 g/dL (ref 3.6–4.8)
ALK PHOS: 65 IU/L (ref 39–117)
ALT: 15 IU/L (ref 0–32)
AST: 18 IU/L (ref 0–40)
Albumin/Globulin Ratio: 1.5 (ref 1.1–2.5)
BUN / CREAT RATIO: 14 (ref 11–26)
BUN: 16 mg/dL (ref 8–27)
Bilirubin Total: 0.3 mg/dL (ref 0.0–1.2)
CALCIUM: 9.9 mg/dL (ref 8.7–10.3)
CO2: 25 mmol/L (ref 18–29)
CREATININE: 1.18 mg/dL — AB (ref 0.57–1.00)
Chloride: 100 mmol/L (ref 96–106)
GFR calc Af Amer: 55 mL/min/{1.73_m2} — ABNORMAL LOW (ref >59)
GFR, EST NON AFRICAN AMERICAN: 48 mL/min/{1.73_m2} — AB (ref >59)
GLUCOSE: 147 mg/dL — AB (ref 65–99)
Globulin, Total: 2.7 g/dL (ref 1.5–4.5)
Potassium: 3.8 mmol/L (ref 3.5–5.2)
Sodium: 142 mmol/L (ref 134–144)
Total Protein: 6.8 g/dL (ref 6.0–8.5)

## 2015-05-31 ENCOUNTER — Telehealth: Payer: Self-pay

## 2015-05-31 NOTE — Telephone Encounter (Signed)
Insurance approved appeal for [employer] strips through May 22, 2016

## 2015-07-27 ENCOUNTER — Telehealth: Payer: Self-pay | Admitting: Family Medicine

## 2015-07-27 NOTE — Telephone Encounter (Signed)
Patient [facility] 10:45 in the [facility]

## 2015-07-28 ENCOUNTER — Ambulatory Visit (INDEPENDENT_AMBULATORY_CARE_PROVIDER_SITE_OTHER): Payer: Commercial Managed Care - HMO | Admitting: Family Medicine

## 2015-07-28 ENCOUNTER — Encounter: Payer: Self-pay | Admitting: Family Medicine

## 2015-07-28 VITALS — BP 130/77 | HR 65 | Temp 96.8°F | Ht 63.0 in | Wt 153.0 lb

## 2015-07-28 DIAGNOSIS — R197 Diarrhea, unspecified: Secondary | ICD-10-CM | POA: Diagnosis not present

## 2015-07-28 NOTE — Progress Notes (Signed)
Subjective:    Patient ID: Tonya Carpenter, female    DOB: 12/10/1945, 70 y.o.   MRN: [id number]  HPI Patient here today for diarrhea. She thinks that her medications, Metformin, is causing this. She reports weight loss but I reviewed her records from 2 years ago and the chart and her weight is essentially the same. She also takes Amaryl 4 mg. She has not had any blood in the diarrhea. She has not had any abdominal cramping. Diarrhea so bad that she sometimes is unable to control her bowels. She is convinced that it's related to metformin and it may well be. She has not been on any antibiotics recently.     Patient Active Problem List   Diagnosis Date Noted  . GERD (gastroesophageal reflux disease) 10/20/2013  . Frequency 02/18/2013  . Chest pain 01/21/2013  . Precordial pain 01/15/2013  . Coronary atherosclerosis of native coronary artery 01/14/2013  . CAD, multiple vessel 07/10/2012  . CHEST PAIN UNSPECIFIED 09/04/2009  . CARDIAC MURMUR 07/03/2009  . Type 2 diabetes mellitus (HCC) 09/12/2008  . Hyperlipidemia 09/12/2008  . Essential hypertension 09/12/2008   Outpatient Encounter Prescriptions as of 07/28/2015  Medication Sig  . Alcohol Swabs (B-D SINGLE USE SWABS REGULAR) PADS TEST TWICE A DAY   . aspirin 81 MG tablet Take 1 tablet (81 mg total) by mouth daily.  Marland Kitchen atorvastatin (LIPITOR) 20 MG tablet TAKE 1 TABLET EVERY DAY  . cholecalciferol (VITAMIN D) 1000 UNITS tablet   . glimepiride (AMARYL) 4 MG tablet TAKE 1 TABLET EVERY MORNING BEFORE A MEAL  . hydrALAZINE (APRESOLINE) 10 MG tablet TAKE 1 TABLET FOUR TIMES DAILY  . Liniments (ORTHOGEL) GEL Apply 1 application topically daily as needed (pain).   . metFORMIN (GLUCOPHAGE) 500 MG tablet TAKE 1 TABLET TWICE DAILY WITH MEALS  . metoprolol succinate (TOPROL-XL) 100 MG 24 hr tablet TAKE 1 TABLET (100 MG TOTAL) DAILY. TAKE WITH OR IMMEDIATELY FOLLOWING A MEAL.  . nitroGLYCERIN (NITROSTAT) 0.4 MG SL tablet Place 1 tablet (0.4 mg total)  under the tongue every 5 (five) minutes as needed. For chest pain  . omeprazole (PRILOSEC) 40 MG capsule TAKE 1 CAPSULE EVERY DAY  . polyethylene glycol powder (GLYCOLAX/MIRALAX) powder Take 17 g by mouth 2 (two) times daily as needed.  Marland Kitchen PRODIGY NO CODING BLOOD GLUC test strip TEST  BLOOD  SUGAR TWICE DAILY  . quinapril-hydrochlorothiazide (ACCURETIC) 20-12.5 MG tablet TAKE 1 TABLET EVERY DAY   No facility-administered encounter medications on file as of 07/28/2015.     Review of Systems  Constitutional: Negative.   HENT: Negative.   Eyes: Negative.   Respiratory: Negative.   Cardiovascular: Negative.   Gastrointestinal: Positive for diarrhea. Negative for abdominal pain.  Endocrine: Negative.   Genitourinary: Negative.   Musculoskeletal: Negative.   Skin: Negative.   Allergic/Immunologic: Negative.   Neurological: Negative.   Hematological: Negative.   Psychiatric/Behavioral: Negative.        Objective:   Physical Exam  Constitutional: She is oriented to person, place, and time. She appears well-developed and well-nourished.  HENT:  Mouth/Throat: Oropharynx is clear and moist.  Cardiovascular: Normal rate and regular rhythm.   Pulmonary/Chest: Effort normal and breath sounds normal.  Abdominal: Soft. There is no tenderness. There is no guarding.  Neurological: She is alert and oriented to person, place, and time.    BP 130/77 mmHg  Pulse 65  Temp(Src) 96.8 F (36 C) (Oral)  Ht  (1.6 m)  Wt 153 lb (69.4  kg)  BMI 27.11 kg/m2       Assessment & Plan:  1. Diarrhea, unspecified type We will discontinue metformin thinking that the diarrhea may be related. As I explained to her that her many other causes of diarrhea but the simplest approach would be to stop metformin and see if symptoms improve. To compensate for stopping metformin will increase Amaryl to 6 mg per day and ask her to do her part and leaving off the   [employer] M Miller MDsweets and sodas. If  symptoms do not improve will initiate workup.

## 2015-08-10 ENCOUNTER — Telehealth: Payer: Self-pay | Admitting: Family Medicine

## 2015-08-11 ENCOUNTER — Other Ambulatory Visit: Payer: Self-pay | Admitting: *Deleted

## 2015-08-11 MED ORDER — GLIMEPIRIDE 4 MG PO TABS: 6.0000 mg | ORAL_TABLET | Freq: Every day | ORAL | Status: DC

## 2015-08-11 NOTE — Telephone Encounter (Signed)
Her last visit was all about her diarrhea and reaction to metformin and we stopped it and increased Amaryl in its place to 6 mg. I'm not sure why she is asking this question since it was only recently that she was seen for this problem

## 2015-08-11 NOTE — Telephone Encounter (Signed)
Patient aware that she was  suppose to quit taking metformin 2 weeks ago and to up the Amaryl to 6mg .  Patient states that she has still been taking the metformin.  Informed patient to quit taking the metformin and start with the Amaryl at 6 mg and this should help with the Diarrhea.  Patient verbalized understanding.

## 2015-08-15 ENCOUNTER — Ambulatory Visit (INDEPENDENT_AMBULATORY_CARE_PROVIDER_SITE_OTHER): Payer: Commercial Managed Care - HMO | Admitting: Family Medicine

## 2015-08-15 ENCOUNTER — Encounter: Payer: Self-pay | Admitting: Family Medicine

## 2015-08-15 ENCOUNTER — Ambulatory Visit (INDEPENDENT_AMBULATORY_CARE_PROVIDER_SITE_OTHER): Payer: Commercial Managed Care - HMO

## 2015-08-15 VITALS — BP 119/56 | HR 67 | Temp 97.3°F | Ht 63.0 in | Wt 155.0 lb

## 2015-08-15 DIAGNOSIS — M25561 Pain in right knee: Secondary | ICD-10-CM

## 2015-08-15 DIAGNOSIS — N183 Chronic kidney disease, stage 3 unspecified: Secondary | ICD-10-CM

## 2015-08-15 DIAGNOSIS — E1122 Type 2 diabetes mellitus with diabetic chronic kidney disease: Secondary | ICD-10-CM

## 2015-08-15 MED ORDER — SITAGLIPTIN PHOSPHATE 50 MG PO TABS: 50.0000 mg | ORAL_TABLET | Freq: Every day | ORAL | Status: DC

## 2015-08-15 NOTE — Progress Notes (Signed)
Subjective:    Patient ID: Tonya SimmeringMary W Ekholm, female    DOB: 05/17/1945, 70 y.o.   MRN: [id number]  HPI Patient here today for right knee pain from a fall that happened yesterday. Patient fell after getting out of bathtub slipping on the tile. Last visit we stopped her metformin as she was having diarrhea. Now she reports that her sugars of been elevated. The only other change that was made was to increase her glimepiride from 4-6 mg.      Patient Active Problem List   Diagnosis Date Noted  . GERD (gastroesophageal reflux disease) 10/20/2013  . Frequency 02/18/2013  . Chest pain 01/21/2013  . Precordial pain 01/15/2013  . Coronary atherosclerosis of native coronary artery 01/14/2013  . CAD, multiple vessel 07/10/2012  . CHEST PAIN UNSPECIFIED 09/04/2009  . CARDIAC MURMUR 07/03/2009  . Type 2 diabetes mellitus (HCC) 09/12/2008  . Hyperlipidemia 09/12/2008  . Essential hypertension 09/12/2008   Outpatient Encounter Prescriptions as of 08/15/2015  Medication Sig  . Alcohol Swabs (B-D SINGLE USE SWABS REGULAR) PADS TEST TWICE A DAY   . aspirin 81 MG tablet Take 1 tablet (81 mg total) by mouth daily.  Marland Kitchen. atorvastatin (LIPITOR) 20 MG tablet TAKE 1 TABLET EVERY DAY  . cholecalciferol (VITAMIN D) 1000 UNITS tablet   . glimepiride (AMARYL) 4 MG tablet Take 1.5 tablets (6 mg total) by mouth daily.  . hydrALAZINE (APRESOLINE) 10 MG tablet TAKE 1 TABLET FOUR TIMES DAILY  . Liniments (ORTHOGEL) GEL Apply 1 application topically daily as needed (pain).   . metoprolol succinate (TOPROL-XL) 100 MG 24 hr tablet TAKE 1 TABLET (100 MG TOTAL) DAILY. TAKE WITH OR IMMEDIATELY FOLLOWING A MEAL.  . nitroGLYCERIN (NITROSTAT) 0.4 MG SL tablet Place 1 tablet (0.4 mg total) under the tongue every 5 (five) minutes as needed. For chest pain  . omeprazole (PRILOSEC) 40 MG capsule TAKE 1 CAPSULE EVERY DAY  . polyethylene glycol powder (GLYCOLAX/MIRALAX) powder Take 17 g by mouth 2 (two) times daily as needed.  Marland Kitchen.  PRODIGY NO CODING BLOOD GLUC test strip TEST  BLOOD  SUGAR TWICE DAILY  . quinapril-hydrochlorothiazide (ACCURETIC) 20-12.5 MG tablet TAKE 1 TABLET EVERY DAY  . [DISCONTINUED] metFORMIN (GLUCOPHAGE) 500 MG tablet TAKE 1 TABLET TWICE DAILY WITH MEALS   No facility-administered encounter medications on file as of 08/15/2015.      Review of Systems  Constitutional: Negative.   HENT: Negative.   Eyes: Negative.   Respiratory: Negative.   Cardiovascular: Negative.   Gastrointestinal: Negative.   Endocrine: Negative.   Genitourinary: Negative.   Musculoskeletal: Positive for arthralgias (right knee pain).  Skin: Negative.   Allergic/Immunologic: Negative.   Neurological: Positive for dizziness (for 2 days ).  Hematological: Negative.   Psychiatric/Behavioral: Negative.        Objective:   Physical Exam  Constitutional: She appears well-developed and well-nourished.  Musculoskeletal:  Right knee: There is a slight effusion palpable. The knee seems stable to stress testing. There is some tenderness with palpation over the patella   BP 119/56 mmHg  Pulse 67  Temp(Src) 97.3 F (36.3 C) (Oral)  Ht 5\' 3"  (1.6 m)  Wt 155 lb (70.308 kg)  BMI 27.46 kg/m2        Assessment & Plan:  1. Right knee pain X-ray shows no evidence of bony change. Have recommended use as tolerated and ice pack for swelling - DG Knee 1-2 Views Right; Future   2. Type 2 diabetes mellitus with stage 3 chronic  kidney disease, without long-term current use of insulin (HCC) By history, sugars have not been well controlled off metformin. Will try Januvia 50 mg to take along with Amaryl. Patient has follow-up appointment about one month from now.  Frederica Kuster MD

## 2015-08-16 ENCOUNTER — Telehealth: Payer: Self-pay | Admitting: *Deleted

## 2015-08-16 NOTE — Telephone Encounter (Signed)
Patient aware to take one and a half pills of amaryl 4 mg.   Call us back with new blood sugar readings.

## 2015-09-11 ENCOUNTER — Encounter: Payer: Self-pay | Admitting: Family Medicine

## 2015-09-11 ENCOUNTER — Ambulatory Visit (INDEPENDENT_AMBULATORY_CARE_PROVIDER_SITE_OTHER): Payer: Commercial Managed Care - HMO | Admitting: Family Medicine

## 2015-09-11 VITALS — BP 158/71 | HR 53 | Temp 97.0°F | Ht 63.0 in | Wt 156.0 lb

## 2015-09-11 DIAGNOSIS — K2100 Gastro-esophageal reflux disease with esophagitis, without bleeding: Secondary | ICD-10-CM

## 2015-09-11 DIAGNOSIS — E1122 Type 2 diabetes mellitus with diabetic chronic kidney disease: Secondary | ICD-10-CM | POA: Diagnosis not present

## 2015-09-11 DIAGNOSIS — E785 Hyperlipidemia, unspecified: Secondary | ICD-10-CM | POA: Diagnosis not present

## 2015-09-11 DIAGNOSIS — N183 Chronic kidney disease, stage 3 (moderate): Secondary | ICD-10-CM

## 2015-09-11 DIAGNOSIS — I10 Essential (primary) hypertension: Secondary | ICD-10-CM | POA: Diagnosis not present

## 2015-09-11 LAB — BAYER DCA HB A1C WAIVED: HB A1C (BAYER DCA - WAIVED): 9.1 % — ABNORMAL HIGH (ref <7.0)

## 2015-09-11 MED ORDER — ATORVASTATIN CALCIUM 20 MG PO TABS: 20.0000 mg | ORAL_TABLET | Freq: Every day | ORAL | Status: DC

## 2015-09-11 NOTE — Patient Instructions (Signed)
Medicare Annual Wellness Visit  [place] and the medical providers at [facility] strive to bring you the best medical care.  In doing so we not only want to address your current medical conditions and concerns but also to detect new conditions early and prevent illness, disease and health-related problems.    Medicare offers a yearly Wellness Visit which allows our clinical staff to assess your need for preventative services including immunizations, lifestyle education, counseling to decrease risk of preventable diseases and screening for fall risk and other medical concerns.    This visit is provided free of charge (no copay) for all Medicare recipients. The clinical pharmacists at [facility] have begun to conduct these Wellness Visits which will also include a thorough review of all your medications.    As you primary medical provider recommend that you make an appointment for your Annual Wellness Visit if you have not done so already this year.  You may set up this appointment before you leave today or you may call back ([phone]) and schedule an appointment.  Please make sure when you call that you mention that you are scheduling your Annual Wellness Visit with the clinical pharmacist so that the appointment may be made for the proper length of time.     Continue current medications. Continue good therapeutic lifestyle changes which include good diet and exercise. Fall precautions discussed with patient. If an FOBT was given today- please return it to our front desk. If you are over 50 years old - you may need Prevnar 13 or the adult Pneumonia vaccine.  **Flu shots are available--- please call and schedule a FLU-CLINIC appointment**  After your visit with us today you will receive a survey in the mail or online from [employer] regarding your care with us. Please take a moment to fill this out. Your feedback is very  important to us as you can help us better understand your patient needs as well as improve your experience and satisfaction. WE CARE ABOUT YOU!!!    

## 2015-09-11 NOTE — Progress Notes (Signed)
Subjective:    Patient ID: Tonya Carpenter, female    DOB: Dec 29, 1945, 70 y.o.   MRN: [id number]  HPI Pt here for follow up and management of chronic medical problems which includes diabetes and hyperlipidemia. She is taking medications regularly.  There always seems to be some confusion about the medicines she is supposed to be taking. At her last visit we stopped the metformin because she was complaining of diarrhea and added Januvia to be taken along with glimepiride. I think she still takes metformin occasionally. Sugars in the last month have generally been less than 200 although most of been an the range between 150 and 200 mg percent     Patient Active Problem List   Diagnosis Date Noted  . GERD (gastroesophageal reflux disease) 10/20/2013  . Frequency 02/18/2013  . Chest pain 01/21/2013  . Precordial pain 01/15/2013  . Coronary atherosclerosis of native coronary artery 01/14/2013  . CAD, multiple vessel 07/10/2012  . CHEST PAIN UNSPECIFIED 09/04/2009  . CARDIAC MURMUR 07/03/2009  . Type 2 diabetes mellitus ([place]) 09/12/2008  . Hyperlipidemia 09/12/2008  . Essential hypertension 09/12/2008   Outpatient Encounter Prescriptions as of 09/11/2015  Medication Sig  . aspirin 81 MG tablet Take 1 tablet (81 mg total) by mouth daily.  Marland Kitchen atorvastatin (LIPITOR) 20 MG tablet TAKE 1 TABLET EVERY DAY  . cholecalciferol (VITAMIN D) 1000 UNITS tablet   . glimepiride (AMARYL) 4 MG tablet Take 1.5 tablets (6 mg total) by mouth daily.  . hydrALAZINE (APRESOLINE) 10 MG tablet TAKE 1 TABLET FOUR TIMES DAILY  . Liniments (ORTHOGEL) GEL Apply 1 application topically daily as needed (pain).   . metoprolol succinate (TOPROL-XL) 100 MG 24 hr tablet TAKE 1 TABLET (100 MG TOTAL) DAILY. TAKE WITH OR IMMEDIATELY FOLLOWING A MEAL.  Marland Kitchen omeprazole (PRILOSEC) 40 MG capsule TAKE 1 CAPSULE EVERY DAY  . polyethylene glycol powder (GLYCOLAX/MIRALAX) powder Take 17 g by mouth 2 (two) times daily as needed.  Marland Kitchen PRODIGY  NO CODING BLOOD GLUC test strip TEST  BLOOD  SUGAR TWICE DAILY  . quinapril-hydrochlorothiazide (ACCURETIC) 20-12.5 MG tablet TAKE 1 TABLET EVERY DAY  . sitaGLIPtin (JANUVIA) 50 MG tablet Take 1 tablet (50 mg total) by mouth daily.  . nitroGLYCERIN (NITROSTAT) 0.4 MG SL tablet Place 1 tablet (0.4 mg total) under the tongue every 5 (five) minutes as needed. For chest pain (Patient not taking: Reported on 09/11/2015)  . [DISCONTINUED] Alcohol Swabs (B-D SINGLE USE SWABS REGULAR) PADS TEST TWICE A DAY    No facility-administered encounter medications on file as of 09/11/2015.     Review of Systems  Constitutional: Negative.   HENT: Negative.   Eyes: Negative.   Respiratory: Negative.   Cardiovascular: Negative.   Gastrointestinal: Negative.   Endocrine: Negative.   Genitourinary: Negative.   Musculoskeletal: Negative.   Skin: Negative.   Allergic/Immunologic: Negative.   Neurological: Negative.   Hematological: Negative.   Psychiatric/Behavioral: Negative.        Objective:   Physical Exam  Constitutional: She is oriented to person, place, and time. She appears well-developed and well-nourished.  Cardiovascular: Normal rate, regular rhythm and normal heart sounds.   Pulmonary/Chest: Effort normal and breath sounds normal.  Neurological: She is alert and oriented to person, place, and time.  Psychiatric: She has a normal mood and affect. Her behavior is normal.   BP 158/71 mmHg  Pulse 53  Temp(Src) 97 F (36.1 C) (Oral)  Ht _0  (1.6 m)  Wt 156 lb (70.761  kg)  BMI 27.64 kg/m2        Assessment & Plan:  1. Type 2 diabetes mellitus with stage 3 chronic kidney disease, without long-term current use of insulin (HCC) Asked A1c was 8.0. I would estimate based on her blood sugars it will be a little less today - Bayer DCA Hb A1c Waived - BMP8+EGFR  2. Hyperlipidemia Lipids are at goal with LDL of 63. Continue with atorvastatin  3. Essential hypertension She is on  multidrug therapy for blood pressure during this includes quinapril, metoprolol, and hydralazine. There is some slight elevation in systolic reading today - CJA7+WPTY  4. Gastroesophageal reflux disease with esophagitis Continue on omeprazole. This seems to be effective in control of symptoms  Wardell Honour MD

## 2015-09-12 LAB — BMP8+EGFR
BUN/Creatinine Ratio: 13 (ref 12–28)
BUN: 15 mg/dL (ref 8–27)
CALCIUM: 10 mg/dL (ref 8.7–10.3)
CHLORIDE: 98 mmol/L (ref 96–106)
CO2: 28 mmol/L (ref 18–29)
CREATININE: 1.2 mg/dL — AB (ref 0.57–1.00)
GFR calc non Af Amer: 47 mL/min/{1.73_m2} — ABNORMAL LOW (ref >59)
GFR, EST AFRICAN AMERICAN: 54 mL/min/{1.73_m2} — AB (ref >59)
Glucose: 156 mg/dL — ABNORMAL HIGH (ref 65–99)
Potassium: 4.2 mmol/L (ref 3.5–5.2)
Sodium: 140 mmol/L (ref 134–144)

## 2015-09-21 ENCOUNTER — Other Ambulatory Visit: Payer: Self-pay | Admitting: Family Medicine

## 2015-09-21 ENCOUNTER — Telehealth: Payer: Self-pay | Admitting: Family Medicine

## 2015-09-21 NOTE — Telephone Encounter (Signed)
Spoke with patient, she was calling us back about her lab results.  These were explained to her.

## 2015-10-05 ENCOUNTER — Telehealth: Payer: Self-pay | Admitting: Family Medicine

## 2015-10-05 MED ORDER — ASPIRIN 81 MG PO TABS: 81.0000 mg | ORAL_TABLET | Freq: Every day | ORAL | Status: DC

## 2015-10-05 MED ORDER — SITAGLIPTIN PHOSPHATE 50 MG PO TABS: 50.0000 mg | ORAL_TABLET | Freq: Every day | ORAL | Status: DC

## 2015-10-05 NOTE — Telephone Encounter (Signed)
done

## 2015-11-16 ENCOUNTER — Encounter: Payer: Self-pay | Admitting: Family Medicine

## 2015-11-16 ENCOUNTER — Ambulatory Visit (INDEPENDENT_AMBULATORY_CARE_PROVIDER_SITE_OTHER): Payer: Commercial Managed Care - HMO | Admitting: Family Medicine

## 2015-11-16 VITALS — BP 117/59 | HR 56 | Temp 96.9°F | Ht 63.0 in | Wt 156.8 lb

## 2015-11-16 DIAGNOSIS — I10 Essential (primary) hypertension: Secondary | ICD-10-CM | POA: Diagnosis not present

## 2015-11-16 DIAGNOSIS — E1122 Type 2 diabetes mellitus with diabetic chronic kidney disease: Secondary | ICD-10-CM | POA: Diagnosis not present

## 2015-11-16 DIAGNOSIS — N183 Chronic kidney disease, stage 3 unspecified: Secondary | ICD-10-CM

## 2015-11-16 MED ORDER — LOSARTAN POTASSIUM-HCTZ 50-12.5 MG PO TABS: 1.0000 | ORAL_TABLET | Freq: Every day | ORAL | 1 refills | Status: DC

## 2015-11-16 NOTE — Progress Notes (Signed)
Subjective:    Patient ID: Tonya Carpenter, female    DOB: 11/24/1945, 70 y.o.   MRN: [id number]  HPI 70 year old female with cough. She reports that she has had a cough since April. She has been using over-the-counter cough medicine. For her blood pressure she takes an ACE inhibitor, quinapril. She has stopped her metformin due to diarrhea. She was given Januvia along with glimepiride. Her sugars have generally been under 200 her last A1c was 9.12 months ago.  Patient Active Problem List   Diagnosis Date Noted  . GERD (gastroesophageal reflux disease) 10/20/2013  . Frequency 02/18/2013  . Chest pain 01/21/2013  . Precordial pain 01/15/2013  . Coronary atherosclerosis of native coronary artery 01/14/2013  . CAD, multiple vessel 07/10/2012  . CHEST PAIN UNSPECIFIED 09/04/2009  . CARDIAC MURMUR 07/03/2009  . Type 2 diabetes mellitus (HCC) 09/12/2008  . Hyperlipidemia 09/12/2008  . Essential hypertension 09/12/2008   Outpatient Encounter Prescriptions as of 11/16/2015  Medication Sig  . aspirin 81 MG tablet Take 1 tablet (81 mg total) by mouth daily.  Marland Kitchen. atorvastatin (LIPITOR) 20 MG tablet Take 1 tablet (20 mg total) by mouth daily.  . cholecalciferol (VITAMIN D) 1000 UNITS tablet   . glimepiride (AMARYL) 4 MG tablet Take 1.5 tablets (6 mg total) by mouth daily.  . hydrALAZINE (APRESOLINE) 10 MG tablet TAKE 1 TABLET FOUR TIMES DAILY  . Liniments (ORTHOGEL) GEL Apply 1 application topically daily as needed (pain).   . metoprolol succinate (TOPROL-XL) 100 MG 24 hr tablet TAKE 1 TABLET (100 MG TOTAL) DAILY. TAKE WITH OR IMMEDIATELY FOLLOWING A MEAL.  . nitroGLYCERIN (NITROSTAT) 0.4 MG SL tablet PLACE 1 TABLET (0.4 MG TOTAL) UNDER THE TONGUE EVERY 5 (FIVE) MINUTES AS NEEDED. FOR CHEST PAIN  . omeprazole (PRILOSEC) 40 MG capsule TAKE 1 CAPSULE EVERY DAY  . polyethylene glycol powder (GLYCOLAX/MIRALAX) powder Take 17 g by mouth 2 (two) times daily as needed.  Marland Kitchen. PRODIGY NO CODING BLOOD GLUC test  strip TEST  BLOOD  SUGAR TWICE DAILY  . quinapril-hydrochlorothiazide (ACCURETIC) 20-12.5 MG tablet TAKE 1 TABLET EVERY DAY  . sitaGLIPtin (JANUVIA) 50 MG tablet Take 1 tablet (50 mg total) by mouth daily.   No facility-administered encounter medications on file as of 11/16/2015.       Review of Systems  Constitutional: Negative.   Respiratory: Positive for cough.   Cardiovascular: Negative.   Gastrointestinal: Negative.   Neurological: Negative.   Psychiatric/Behavioral: Negative.        Objective:   Physical Exam  Constitutional: She is oriented to person, place, and time. She appears well-developed and well-nourished.  HENT:  Mouth/Throat: Oropharynx is clear and moist.  Cardiovascular: Normal rate, regular rhythm and normal heart sounds.   Pulmonary/Chest: Effort normal and breath sounds normal.  Neurological: She is alert and oriented to person, place, and time.  Psychiatric: She has a normal mood and affect. Her behavior is normal.   BP (!) 117/59 (BP Location: Right Arm, Patient Position: Sitting, Cuff Size: Normal)   Pulse (!) 56   Temp (!) 96.9 F (36.1 C) (Oral)   Ht 5\' 3"  (1.6 m)   Wt 156 lb 12.8 oz (71.1 kg)   BMI 27.78 kg/m         Assessment & Plan:  1. Essential hypertension Blood pressure is well controlled but I'm concerned that the ACE inhibitor may be contributing to her cough. Will discontinue quinapril and begin Hyzaar, 50/12.5  2. Type 2 diabetes mellitus with stage  3 chronic kidney disease, without long-term current use of insulin (HCC) Patient's sugars are relatively stable on combination of Januvia and an Amaryl. We'll plan to continue and check A1c at next visit  Frederica Kuster MD

## 2015-11-22 ENCOUNTER — Other Ambulatory Visit: Payer: Self-pay | Admitting: Family Medicine

## 2015-11-30 ENCOUNTER — Telehealth: Payer: Self-pay | Admitting: Family Medicine

## 2015-12-05 ENCOUNTER — Telehealth: Payer: Self-pay | Admitting: Family Medicine

## 2015-12-05 ENCOUNTER — Other Ambulatory Visit: Payer: Self-pay | Admitting: Family Medicine

## 2015-12-06 NOTE — Telephone Encounter (Signed)
Rxs sent to pharmacy this morning

## 2015-12-14 NOTE — Telephone Encounter (Signed)
Multiple attempts made to contact patient.  This encounter will now be closed  

## 2015-12-15 ENCOUNTER — Ambulatory Visit: Payer: Commercial Managed Care - HMO | Admitting: Family Medicine

## 2016-01-10 ENCOUNTER — Encounter (INDEPENDENT_AMBULATORY_CARE_PROVIDER_SITE_OTHER): Payer: Self-pay

## 2016-01-10 ENCOUNTER — Ambulatory Visit (INDEPENDENT_AMBULATORY_CARE_PROVIDER_SITE_OTHER): Payer: Commercial Managed Care - HMO | Admitting: Pharmacist

## 2016-01-10 ENCOUNTER — Encounter: Payer: Self-pay | Admitting: Pharmacist

## 2016-01-10 VITALS — BP 110/68 | HR 58 | Ht 62.0 in | Wt 157.0 lb

## 2016-01-10 DIAGNOSIS — Z0000 Encounter for general adult medical examination without abnormal findings: Secondary | ICD-10-CM | POA: Diagnosis not present

## 2016-01-10 DIAGNOSIS — Z23 Encounter for immunization: Secondary | ICD-10-CM

## 2016-01-10 DIAGNOSIS — R29890 Loss of height: Secondary | ICD-10-CM

## 2016-01-10 DIAGNOSIS — Z780 Asymptomatic menopausal state: Secondary | ICD-10-CM

## 2016-01-10 DIAGNOSIS — E1122 Type 2 diabetes mellitus with diabetic chronic kidney disease: Secondary | ICD-10-CM

## 2016-01-10 DIAGNOSIS — N183 Chronic kidney disease, stage 3 (moderate): Secondary | ICD-10-CM

## 2016-01-10 DIAGNOSIS — Z1159 Encounter for screening for other viral diseases: Secondary | ICD-10-CM

## 2016-01-10 LAB — BAYER DCA HB A1C WAIVED: HB A1C: 9.9 % — AB (ref <7.0)

## 2016-01-10 MED ORDER — HYDRALAZINE HCL 10 MG PO TABS: 10.0000 mg | ORAL_TABLET | Freq: Two times a day (BID) | ORAL | 1 refills | Status: DC

## 2016-01-10 MED ORDER — SITAGLIP PHOS-METFORMIN HCL ER 50-500 MG PO TB24: 1.0000 | ORAL_TABLET | Freq: Every day | ORAL | 0 refills | Status: DC

## 2016-01-10 MED ORDER — GLIMEPIRIDE 4 MG PO TABS: ORAL_TABLET | ORAL | 1 refills | Status: DC

## 2016-01-10 NOTE — Progress Notes (Signed)
Patient ID: Tonya Carpenter, female   DOB: June 26, 1945, 70 y.o.   MRN: [id number]     Subjective:   Tonya Carpenter is a 70 y.o. female who presents for an Initial Medicare Annual Wellness Visit.  Tonya Carpenter is married black female.  Her husband is present with her in office today. They live in apartment in [place], [place].   Tonya Carpenter states that she is concerned about her BG which has been high the last 2-3 weeks.  She is currently taking Januvia 14m daily and glimepiride 477mtablets - take 1 and 1/2 tablets with breakfast.  She previously took metformin 50016mid but this was discontinued about 5 months ago due to diarrhea.  HBG readings:  7 day avg = 258 14 day avg = 248 28 day avg = 227  306, 246, 269, 214, 243, 216, 279, 203, 249, 321  She also c/o rash.  She is small eraser sized patches on both legs - about 10 per leg.  She states that they itch from time to time.  Has been present for about 1 month.   Tonya Carpenter reports that she sometimes gets dizzy.  [facility].   Current Medications (verified) Outpatient Encounter Prescriptions as of 01/10/2016  Medication Sig  . aspirin 81 MG tablet Take 1 tablet (81 mg total) by mouth daily.  . aMarland Kitchenorvastatin (LIPITOR) 20 MG tablet Take 1 tablet (20 mg total) by mouth daily.  . cholecalciferol (VITAMIN D) 1000 UNITS tablet   . glimepiride (AMARYL) 4 MG tablet taking 1 and 1/2 tablet once each morning with breakfast  . hydrALAZINE (APRESOLINE) 10 MG tablet Take 1 tablet (10 mg total) by mouth 2 (two) times daily.  . lMarland Kitchensartan-hydrochlorothiazide (HYZAAR) 50-12.5 MG tablet Take 1 tablet by mouth daily.  . metoprolol succinate (TOPROL-XL) 100 MG 24 hr tablet TAKE 1 TABLET EVERY DAY  WITH  OR  IMMEDIATELY  FOLLOWING A MEAL  . nitroGLYCERIN (NITROSTAT) 0.4 MG SL tablet PLACE 1 TABLET (0.4 MG TOTAL) UNDER THE TONGUE EVERY 5 (FIVE) MINUTES AS NEEDED. FOR CHEST PAIN  . omeprazole (PRILOSEC) 40 MG capsule  TAKE 1 CAPSULE EVERY DAY  . polyethylene glycol powder (GLYCOLAX/MIRALAX) powder Take 17 g by mouth 2 (two) times daily as needed.  . PMarland KitchenODIGY NO CODING BLOOD GLUC test strip TEST  BLOOD  SUGAR TWICE DAILY  . PRODIGY TWIST TOP LANCETS 28G MISC TEST TWICE DAILY  . [DISCONTINUED] glimepiride (AMARYL) 4 MG tablet TAKE 1 TABLET EVERY MORNING BEFORE A MEAL (Patient taking differently: taking 1 and 1/2 tablet once each morning with breakfast)  . [DISCONTINUED] hydrALAZINE (APRESOLINE) 10 MG tablet TAKE 1 TABLET FOUR TIMES DAILY (Patient taking differently: take 1 tablet tid)  . [DISCONTINUED] sitaGLIPtin (JANUVIA) 50 MG tablet Take 1 tablet (50 mg total) by mouth daily.  . Liniments (ORTHOGEL) GEL Apply 1 application topically daily as needed (pain).   . SitaGLIPtin-MetFORMIN HCl (JANUMET XR) 50-500 MG TB24 Take 1 tablet by mouth daily after breakfast.   No facility-administered encounter medications on file as of 01/10/2016.     Allergies (verified) Review of patient's allergies indicates no known allergies.   History: Past Medical History:  Diagnosis Date  . Anxiety   . Arthritis    "hands" (01/14/2013)  . Asthma   . Cataract   . Coronary artery disease   . Exertional shortness of breath   . Hyperlipidemia   . Hypertension   .  Myocardial infarction    "several years back" (01/14/2013)  . Stroke [facility])    "light stroke long years ago; didn't affect me permanently" (01/14/2013)  . Type 2 diabetes mellitus ([place])    Past Surgical History:  Procedure Laterality Date  . APPENDECTOMY    . CARDIAC CATHETERIZATION  2008   no angiographically significant CAD  . CARDIAC CATHETERIZATION  01/14/2013  . CARDIOVASCULAR STRESS TEST  12/2009   no evidence for stress-induced reversibility or ischemia.  She had a fixed anterior septal wall defect, possibly related to breast attenuation and her EF was 54%.  Marland Kitchen CATARACT EXTRACTION W/PHACO Right 10/25/2014   Procedure: CATARACT EXTRACTION PHACO AND  INTRAOCULAR LENS PLACEMENT; CDE:  17.28;  Surgeon: Rutherford Guys, MD;  Location: [facility];  Service: Ophthalmology;  Laterality: Right;  . CATARACT EXTRACTION W/PHACO Left 11/08/2014   Procedure: CATARACT EXTRACTION PHACO AND INTRAOCULAR LENS PLACEMENT (IOC);  Surgeon: Rutherford Guys, MD;  Location: [facility];  Service: Ophthalmology;  Laterality: Left;  CDE: 5.94  . CHOLECYSTECTOMY    . DILATION AND CURETTAGE OF UTERUS    . LEFT HEART CATHETERIZATION WITH CORONARY ANGIOGRAM N/A 01/14/2013   Procedure: LEFT HEART CATHETERIZATION WITH CORONARY ANGIOGRAM;  Surgeon: Burnell Blanks, MD;  Location: [facility] CATH LAB;  Service: Cardiovascular;  Laterality: N/A;  . TONSILLECTOMY    . TUBAL LIGATION    . VAGINAL HYSTERECTOMY     Family History  Problem Relation Age of Onset  . Hypertension Mother   . Hypertension Father   . Cancer Sister     breast   Social History   Occupational History  . Disability Unemployed   Social History Main Topics  . Smoking status: Never Smoker  . Smokeless tobacco: Never Used  . Alcohol use No  . Drug use: No  . Sexual activity: No    Do you feel safe at home?  Yes Are there smokers in your home (other than you)? No  Dietary issues and exercise activities: Current Exercise Habits: Home exercise routine, Type of exercise: walking, Time (Minutes): 30, Frequency (Times/Week): 5, Weekly Exercise (Minutes/Week): 150, Intensity: Moderate  Current Dietary habits:   Breakfast - eggs and cheese, bacon, 1 or 2 pieces of toast; biscuits Lunch - spam sandwhich Supper - chicken, broccoli, green beans, cabbage, greens Snack - popcorne, crackers/ nabs - 4 Drinks - sweet tea or water  Objective:    Today's Vitals   01/10/16 1225  BP: 110/68  Pulse: (!) 58  Weight: 157 lb (71.2 kg)  Height: _0  (1.575 m)  PainSc: 0-No pain   Body mass index is 28.72 kg/m.   A1c = 9.9% today  Activities of Daily Living In your present state of health, do you have any  difficulty performing the following activities: 01/10/2016  Hearing? N  Vision? N  Difficulty concentrating or making decisions? Y  Walking or climbing stairs? N  Dressing or bathing? N  Doing errands, shopping? N  Preparing Food and eating ? N  Using the Toilet? N  In the past six months, have you accidently leaked urine? N  Do you have problems with loss of bowel control? N  Managing your Medications? N  Managing your Finances? N  Housekeeping or managing your Housekeeping? N  Some recent data might be hidden     Cardiac Risk Factors include: advanced age (>24mn, >>102women);diabetes mellitus;dyslipidemia;hypertension  Depression Screen PHQ 2/9 Scores 01/10/2016 09/11/2015 08/15/2015 07/28/2015  PHQ - 2 Score 2 0 0 0  PHQ-  9 Score 5 - - -     Fall Risk Fall Risk  01/10/2016 09/11/2015 08/15/2015 07/28/2015 05/09/2015  Falls in the past year? Yes Yes Yes No No  Number falls in past yr: _0 - -  Injury with Fall? Yes No Yes - -  Risk Factor Category  High Fall Risk - - - -  Risk for fall due to : History of fall(s) - - - -  Follow up Falls prevention discussed - - - -    Cognitive Function: MMSE - Mini Mental State Exam 01/10/2016  Orientation to time 5  Orientation to Place 5  Registration 3  Recall 0  Language- name 2 objects 2  Language- repeat 1  Language- follow 3 step command 3  Language- read & follow direction 1  Write a sentence 1  Copy design 1    Immunizations and Health Maintenance Immunization History  Administered Date(s) Administered  . Influenza,inj,Quad PF,36+ Mos 12/18/2012, 02/02/2014, 01/03/2015, 01/10/2016  . Pneumococcal Conjugate-13 12/23/2012  . Pneumococcal Polysaccharide-23 05/25/2014  . Tdap 07/19/2013  . Zoster 06/04/2013   Health Maintenance Due  Topic Date Due  . Hepatitis C Screening  17-Mar-1946  . DEXA SCAN  02/20/2012  . FOOT EXAM  10/03/2015  . OPHTHALMOLOGY EXAM  10/04/2015  . INFLUENZA VACCINE  10/17/2015  . MAMMOGRAM   12/05/2015    Patient Care Team: Wardell Honour, MD as PCP - General (Family Medicine)  Indicate any recent Medical Services you may have received from other than [facility] providers in the past year (date may be approximate).    Assessment:    Annual Wellness Visit  Dizziness / low BP Rash on both low extremities Uncontrolled type 2 DM   Screening Tests Health Maintenance  Topic Date Due  . Hepatitis C Screening  1945-05-20  . DEXA SCAN  02/20/2012  . FOOT EXAM  10/03/2015  . OPHTHALMOLOGY EXAM  10/04/2015  . INFLUENZA VACCINE  10/17/2015  . MAMMOGRAM  12/05/2015  . HEMOGLOBIN A1C  03/12/2016  . COLONOSCOPY  10/21/2018  . TETANUS/TDAP  07/20/2023  . ZOSTAVAX  Completed  . PNA vac Low Risk Adult  Completed        Plan:   During the course of the visit Tonya Carpenter was educated and counseled about the following appropriate screening and preventive services:   Vaccines to include Pneumoccal, Influenza, Td, Zostavax - given influenza vaccine today.  All other vaccines are UTD  Colorectal cancer screening - UTD  Cardiovascular disease screening - EKG is UTD; taking statin  BP low today and patient having dizziness - decrease hydralazine 55m bid.    Diabetes - change Januvia to Janumet XR 50/5046m1 tablet qd with breakfast. Continue glimepiride 55m50m and 1/2 tablet qd  Bone Denisty / Osteoporosis Screening - ordered for 01/12/2016  Mammogram - appt 02/2016  Glaucoma screening / Diabetic Eye Exam - has appt 03/2016  Nutrition counseling - CHO counting diet reviewed.  Recommeded switch to unsweetened tea.  Choose lower fat protein options.   Advanced Directives - patient declined  Physical Activity - continue to walk daily  Follow up with PCP as planned 01/12/16 for DM/ HTN and rash  Orders Placed This Encounter  Procedures  . DG [facility] DEXA    Standing Status:   Future    Standing Expiration Date:   03/11/2017    Order Specific Question:   Reason for Exam (SYMPTOM   OR DIAGNOSIS REQUIRED)    Answer:  LOSS OF HEIGHT; POST MENOPAUSAL FEMALE  . Flu Vaccine QUAD 36+ mos IM  . Bayer DCA Hb A1c Waived  . BMP8+EGFR  . Microalbumin / creatinine urine ratio  . Hepatitis C antibody    Patient Instructions (the written plan) were given to the patient.   Cherre Robins, PharmD   01/10/2016

## 2016-01-10 NOTE — Patient Instructions (Addendum)
  Ms. Tonya Carpenter , Thank you for taking time to come for your Medicare Wellness Visit. I appreciate your ongoing commitment to your health goals. Please review the following plan we discussed and let me know if I can assist you in the future.   These are the goals we discussed: Stop Januvia 50mg . - start JanuMet XR 50/500mg  - take 1 tablet once each morning with breakfast.   Decrease hydralazine 10mg  to 1 tablet twice a day - recheck blood pressure when you see Dr Hyacinth MeekerMiller.   Goal Blood glucose:    Fasting (before meals) = 80 to 130   Within 2 hours of eating = less than 180  Try to have no more than 3 of these foods per meal:  Fruit:   1/2 cup or once piece (baseball size)- apples, pears, pineapple, peaches, oranges  1 cup - berries, melons  1/2 banana or grapefruit  Stachy Vegetables:   1/2 cup potatoes (white or sweet), corn, peas  Other starches:   1 piece of bread  1/2 cup rice or pasta  4 inch pancake    These foods you can eat more freely: Proteins:   Fish  Chicken or [place]  Beef or pork (1 or 2 servings per week)  Eggs  Nuts (peanuts, walnuts, almonds, pistachios)  Cheese  Non starchy vegetables:  Green beans  Broccoli or cauliflower  Lettuce, greens, cabbage  Brussel Sprout  Carrots  Onions and peppers  Celery  Tomatoes  Asparagus  Eggplant  Cucumbers  Squash and Zucchini     This is a list of the screening recommended for you and due dates:  Health Maintenance  Topic Date Due  .  Hepatitis C: One time screening is recommended by Center for Disease Control  (CDC) for  adults born from [birth year] through 1964.   Checked today  . DEXA scan (bone density measurement)  02/20/2012  . Complete foot exam   10/03/2015 - will get Friday at appointment with PCP  . Eye exam for diabetics  10/04/2015 - has appointment with Dr Nile RiggsShapiro January 2018  . Flu Shot  Done today  . Mammogram  12/05/2015 - Has appointment for December 2017  . Hemoglobin A1C  Done today - was 9.9%   . Colon Cancer Screening  10/21/2018  . Tetanus Vaccine  07/20/2023  . Shingles Vaccine  Completed  . Pneumonia vaccines  Completed

## 2016-01-11 LAB — BMP8+EGFR
BUN / CREAT RATIO: 16 (ref 12–28)
BUN: 18 mg/dL (ref 8–27)
CHLORIDE: 97 mmol/L (ref 96–106)
CO2: 28 mmol/L (ref 18–29)
CREATININE: 1.15 mg/dL — AB (ref 0.57–1.00)
Calcium: 9.7 mg/dL (ref 8.7–10.3)
GFR calc Af Amer: 56 mL/min/{1.73_m2} — ABNORMAL LOW (ref >59)
GFR calc non Af Amer: 49 mL/min/{1.73_m2} — ABNORMAL LOW (ref >59)
GLUCOSE: 227 mg/dL — AB (ref 65–99)
Potassium: 4.2 mmol/L (ref 3.5–5.2)
Sodium: 138 mmol/L (ref 134–144)

## 2016-01-11 LAB — MICROALBUMIN / CREATININE URINE RATIO
CREATININE, UR: 41.4 mg/dL
MICROALBUM., U, RANDOM: 19.6 ug/mL
Microalb/Creat Ratio: 47.3 mg/g creat — ABNORMAL HIGH (ref 0.0–30.0)

## 2016-01-11 LAB — HEPATITIS C ANTIBODY: Hep C Virus Ab: <0.1 s/co ratio (ref 0.0–0.9)

## 2016-01-12 ENCOUNTER — Ambulatory Visit: Payer: Self-pay

## 2016-01-12 ENCOUNTER — Encounter: Payer: Self-pay | Admitting: Family Medicine

## 2016-01-12 ENCOUNTER — Ambulatory Visit (INDEPENDENT_AMBULATORY_CARE_PROVIDER_SITE_OTHER): Payer: Commercial Managed Care - HMO | Admitting: Family Medicine

## 2016-01-12 VITALS — BP 139/87 | HR 64 | Temp 98.1°F | Ht 62.0 in | Wt 160.4 lb

## 2016-01-12 DIAGNOSIS — E1122 Type 2 diabetes mellitus with diabetic chronic kidney disease: Secondary | ICD-10-CM

## 2016-01-12 DIAGNOSIS — N183 Chronic kidney disease, stage 3 (moderate): Secondary | ICD-10-CM | POA: Diagnosis not present

## 2016-01-12 DIAGNOSIS — I10 Essential (primary) hypertension: Secondary | ICD-10-CM | POA: Diagnosis not present

## 2016-01-12 DIAGNOSIS — E7800 Pure hypercholesterolemia, unspecified: Secondary | ICD-10-CM | POA: Diagnosis not present

## 2016-01-12 NOTE — Progress Notes (Signed)
Subjective:    Patient ID: Tonya Carpenter, female    DOB: [birth year], 70 y.o.   MRN: [id number]  HPI   Patient is here today for followup of chronic medical conditions including hypertension, hyperlipidemia and diabetes. Seen earlier this week and given Janumet. Her sugars have not been well controlled. I think part of it is dietary noncompliance. Her last A1c was 9.1 in June. She was concerned that metformin is causing diarrhea and that also has contributed to her poor control. She does not want to use needles and so she was only taking Januvia and Amaryl prior to the Janumet. I do have some concerns about affording Janumet; maybe she can get it on some patient assistance plan  Patient Active Problem List   Diagnosis Date Noted  . GERD (gastroesophageal reflux disease) 10/20/2013  . Frequency 02/18/2013  . Chest pain 01/21/2013  . Precordial pain 01/15/2013  . Coronary atherosclerosis of native coronary artery 01/14/2013  . CAD, multiple vessel 07/10/2012  . CHEST PAIN UNSPECIFIED 09/04/2009  . CARDIAC MURMUR 07/03/2009  . Type 2 diabetes mellitus (HCC) 09/12/2008  . Hyperlipidemia 09/12/2008  . Essential hypertension 09/12/2008   Outpatient Encounter Prescriptions as of 01/12/2016  Medication Sig  . aspirin 81 MG tablet Take 1 tablet (81 mg total) by mouth daily.  Marland Kitchen atorvastatin (LIPITOR) 20 MG tablet Take 1 tablet (20 mg total) by mouth daily.  . cholecalciferol (VITAMIN D) 1000 UNITS tablet   . glimepiride (AMARYL) 4 MG tablet taking 1 and 1/2 tablet once each morning with breakfast  . hydrALAZINE (APRESOLINE) 10 MG tablet Take 1 tablet (10 mg total) by mouth 2 (two) times daily.  . Liniments (ORTHOGEL) GEL Apply 1 application topically daily as needed (pain).   Marland Kitchen losartan-hydrochlorothiazide (HYZAAR) 50-12.5 MG tablet Take 1 tablet by mouth daily.  . metoprolol succinate (TOPROL-XL) 100 MG 24 hr tablet TAKE 1 TABLET EVERY DAY  WITH  OR  IMMEDIATELY  FOLLOWING A MEAL  .  nitroGLYCERIN (NITROSTAT) 0.4 MG SL tablet PLACE 1 TABLET (0.4 MG TOTAL) UNDER THE TONGUE EVERY 5 (FIVE) MINUTES AS NEEDED. FOR CHEST PAIN  . omeprazole (PRILOSEC) 40 MG capsule TAKE 1 CAPSULE EVERY DAY  . polyethylene glycol powder (GLYCOLAX/MIRALAX) powder Take 17 g by mouth 2 (two) times daily as needed.  Marland Kitchen PRODIGY NO CODING BLOOD GLUC test strip TEST  BLOOD  SUGAR TWICE DAILY  . PRODIGY TWIST TOP LANCETS 28G MISC TEST TWICE DAILY  . SitaGLIPtin-MetFORMIN HCl (JANUMET XR) 50-500 MG TB24 Take 1 tablet by mouth daily after breakfast.   No facility-administered encounter medications on file as of 01/12/2016.        Review of Systems  Constitutional: Negative.   HENT: Negative.   Eyes: Negative.   Respiratory: Negative.   Cardiovascular: Negative.   Gastrointestinal: Negative.   Endocrine: Negative.   Genitourinary: Negative.   Musculoskeletal: Negative.   Skin: Negative.   Allergic/Immunologic: Negative.   Neurological: Negative.   Hematological: Negative.   Psychiatric/Behavioral: Negative.        Objective:   Physical Exam  Constitutional: She is oriented to person, place, and time. She appears well-developed and well-nourished.  Cardiovascular: Normal rate, regular rhythm and normal heart sounds.   Pulmonary/Chest: Effort normal and breath sounds normal.  Neurological: She is alert and oriented to person, place, and time.  Skin:  Right foot has a large callus on the plantar lateral aspect but it is not painful, nails are also dystrophic especially the middle toe  Psychiatric: She has a normal mood and affect. Her behavior is normal.    BP 139/87   Pulse 64   Temp 98.1 F (36.7 C) (Oral)   Ht 5\' 2"  (1.575 m)   Wt 160 lb 6 oz (72.7 kg)   BMI 29.33 kg/m         Assessment & Plan:        1. Type 2 diabetes mellitus with stage 3 chronic kidney disease, without long-term current use of insulin (HCC) Hopefully combination of Januvia and metformin X are will work  and she will tolerate it but time will tell  2. Essential hypertension Blood pressure well controlled on current regimen of hydrochlorothiazide metoprolol losartan and hydralazine  3. Pure hypercholesterolemia Lipids well controlled on current dose of a atorvastatin. Repeat in February  Frederica KusterStephen M Zechariah Bissonnette MD

## 2016-01-18 ENCOUNTER — Other Ambulatory Visit: Payer: Self-pay | Admitting: Family Medicine

## 2016-02-07 ENCOUNTER — Other Ambulatory Visit: Payer: Self-pay | Admitting: Family Medicine

## 2016-02-12 ENCOUNTER — Emergency Department (HOSPITAL_COMMUNITY): Payer: Commercial Managed Care - HMO

## 2016-02-12 ENCOUNTER — Encounter (HOSPITAL_COMMUNITY): Payer: Self-pay | Admitting: Emergency Medicine

## 2016-02-12 ENCOUNTER — Emergency Department (HOSPITAL_COMMUNITY)
Admission: EM | Admit: 2016-02-12 | Discharge: 2016-02-12 | Disposition: A | Discharge status: Home / Self Care | Payer: Commercial Managed Care - HMO | Attending: Emergency Medicine | Admitting: Emergency Medicine

## 2016-02-12 ENCOUNTER — Other Ambulatory Visit: Payer: Self-pay | Admitting: *Deleted

## 2016-02-12 ENCOUNTER — Other Ambulatory Visit: Payer: Self-pay | Admitting: Pharmacist

## 2016-02-12 DIAGNOSIS — I2510 Atherosclerotic heart disease of native coronary artery without angina pectoris: Secondary | ICD-10-CM | POA: Insufficient documentation

## 2016-02-12 DIAGNOSIS — Z79899 Other long term (current) drug therapy: Secondary | ICD-10-CM | POA: Diagnosis not present

## 2016-02-12 DIAGNOSIS — S3991XA Unspecified injury of abdomen, initial encounter: Secondary | ICD-10-CM | POA: Diagnosis not present

## 2016-02-12 DIAGNOSIS — J45909 Unspecified asthma, uncomplicated: Secondary | ICD-10-CM | POA: Insufficient documentation

## 2016-02-12 DIAGNOSIS — R1084 Generalized abdominal pain: Secondary | ICD-10-CM | POA: Diagnosis not present

## 2016-02-12 DIAGNOSIS — M545 Low back pain, unspecified: Secondary | ICD-10-CM

## 2016-02-12 DIAGNOSIS — R52 Pain, unspecified: Secondary | ICD-10-CM | POA: Diagnosis not present

## 2016-02-12 DIAGNOSIS — I10 Essential (primary) hypertension: Secondary | ICD-10-CM | POA: Diagnosis not present

## 2016-02-12 DIAGNOSIS — R1032 Left lower quadrant pain: Secondary | ICD-10-CM | POA: Diagnosis not present

## 2016-02-12 DIAGNOSIS — M25552 Pain in left hip: Secondary | ICD-10-CM

## 2016-02-12 DIAGNOSIS — E119 Type 2 diabetes mellitus without complications: Secondary | ICD-10-CM | POA: Insufficient documentation

## 2016-02-12 DIAGNOSIS — S3992XA Unspecified injury of lower back, initial encounter: Secondary | ICD-10-CM | POA: Diagnosis not present

## 2016-02-12 DIAGNOSIS — S79911A Unspecified injury of right hip, initial encounter: Secondary | ICD-10-CM | POA: Diagnosis not present

## 2016-02-12 DIAGNOSIS — M25551 Pain in right hip: Secondary | ICD-10-CM | POA: Diagnosis not present

## 2016-02-12 DIAGNOSIS — Z7982 Long term (current) use of aspirin: Secondary | ICD-10-CM | POA: Diagnosis not present

## 2016-02-12 DIAGNOSIS — R109 Unspecified abdominal pain: Secondary | ICD-10-CM | POA: Diagnosis not present

## 2016-02-12 LAB — URINALYSIS, ROUTINE W REFLEX MICROSCOPIC
BILIRUBIN URINE: NEGATIVE
Glucose, UA: 100 mg/dL — AB
HGB URINE DIPSTICK: NEGATIVE
KETONES UR: NEGATIVE mg/dL
Leukocytes, UA: NEGATIVE
NITRITE: NEGATIVE
Protein, ur: NEGATIVE mg/dL
SPECIFIC GRAVITY, URINE: 1.01 (ref 1.005–1.030)
pH: 6 (ref 5.0–8.0)

## 2016-02-12 LAB — CBC WITH DIFFERENTIAL/PLATELET
Basophils Absolute: 0 10*3/uL (ref 0.0–0.1)
Basophils Relative: 0 %
EOS ABS: 0 10*3/uL (ref 0.0–0.7)
Eosinophils Relative: 0 %
HEMATOCRIT: 41.2 % (ref 36.0–46.0)
HEMOGLOBIN: 12.9 g/dL (ref 12.0–15.0)
LYMPHS ABS: 2.6 10*3/uL (ref 0.7–4.0)
LYMPHS PCT: 15 %
MCH: 27.3 pg (ref 26.0–34.0)
MCHC: 31.3 g/dL (ref 30.0–36.0)
MCV: 87.1 fL (ref 78.0–100.0)
MONOS PCT: 9 %
Monocytes Absolute: 1.7 10*3/uL — ABNORMAL HIGH (ref 0.1–1.0)
NEUTROS ABS: 13.6 10*3/uL — AB (ref 1.7–7.7)
NEUTROS PCT: 76 %
Platelets: 213 10*3/uL (ref 150–400)
RBC: 4.73 MIL/uL (ref 3.87–5.11)
RDW: 12.9 % (ref 11.5–15.5)
WBC: 17.9 10*3/uL — AB (ref 4.0–10.5)

## 2016-02-12 LAB — COMPREHENSIVE METABOLIC PANEL
ALK PHOS: 53 U/L (ref 38–126)
ALT: 12 U/L — AB (ref 14–54)
ANION GAP: 10 (ref 5–15)
AST: 17 U/L (ref 15–41)
Albumin: 3.8 g/dL (ref 3.5–5.0)
BILIRUBIN TOTAL: 0.7 mg/dL (ref 0.3–1.2)
BUN: 13 mg/dL (ref 6–20)
CALCIUM: 9.4 mg/dL (ref 8.9–10.3)
CO2: 28 mmol/L (ref 22–32)
Chloride: 96 mmol/L — ABNORMAL LOW (ref 101–111)
Creatinine, Ser: 1.04 mg/dL — ABNORMAL HIGH (ref 0.44–1.00)
GFR, EST NON AFRICAN AMERICAN: 54 mL/min — AB (ref >60)
Glucose, Bld: 187 mg/dL — ABNORMAL HIGH (ref 65–99)
Potassium: 3.2 mmol/L — ABNORMAL LOW (ref 3.5–5.1)
Sodium: 134 mmol/L — ABNORMAL LOW (ref 135–145)
TOTAL PROTEIN: 8.4 g/dL — AB (ref 6.5–8.1)

## 2016-02-12 LAB — CBG MONITORING, ED: Glucose-Capillary: 173 mg/dL — ABNORMAL HIGH (ref 65–99)

## 2016-02-12 LAB — LIPASE, BLOOD: Lipase: 36 U/L (ref 11–51)

## 2016-02-12 MED ORDER — TRAMADOL HCL 50 MG PO TABS: 50.0000 mg | ORAL_TABLET | Freq: Four times a day (QID) | ORAL | 0 refills | Status: DC | PRN

## 2016-02-12 MED ORDER — POLYETHYLENE GLYCOL 3350 17 G PO PACK: 17.0000 g | PACK | Freq: Every day | ORAL | 0 refills | Status: DC

## 2016-02-12 MED ORDER — SITAGLIP PHOS-METFORMIN HCL ER 50-500 MG PO TB24: 1.0000 | ORAL_TABLET | Freq: Every day | ORAL | 0 refills | Status: DC

## 2016-02-12 NOTE — ED Notes (Signed)
MD at the bedside  

## 2016-02-12 NOTE — ED Notes (Signed)
Pt gone to xray

## 2016-02-12 NOTE — ED Triage Notes (Signed)
EMS brings pt from home, pt complaining of hurting on left side, worse with movement

## 2016-02-12 NOTE — ED Notes (Signed)
patient assisted to bedside commode, complaining of left side hip pain, would not walk to wheelchair to go to bathroom

## 2016-02-12 NOTE — ED Notes (Signed)
Assisted to bedside commode

## 2016-02-12 NOTE — ED Provider Notes (Signed)
[facility] Provider Note   CSN: [id number] Arrival date & time: 02/12/16  1422     History   Chief Complaint Chief Complaint  Patient presents with  . Back Pain    HPI Tonya Carpenter is a 70 y.o. female.  Patient brought in by EMS. Patient with a complaint of hurting on the left side worse with movement. Nursing did note that seemed to hurt more on the right side with movement. Patient also complaining of generalized abdominal pain that everything that happened suddenly shortly prior to arrival. Denies any chest pain nausea vomiting or diarrhea. No fevers no dysuria. Patient is followed by [facility].    Patient last seen by them October 27 has an appointment November 29.  Patient denies any pain currently. Only has pain when she stands.  Past Medical History:  Diagnosis Date  . Anxiety   . Arthritis    "hands" (01/14/2013)  . Asthma   . Cataract   . Coronary artery disease   . Exertional shortness of breath   . Hyperlipidemia   . Hypertension   . Myocardial infarction    "several years back" (01/14/2013)  . Stroke [facility])    "light stroke long years ago; didn't affect me permanently" (01/14/2013)  . Type 2 diabetes mellitus [facility])     Patient Active Problem List   Diagnosis Date Noted  . GERD (gastroesophageal reflux disease) 10/20/2013  . Frequency 02/18/2013  . Chest pain 01/21/2013  . Precordial pain 01/15/2013  . Coronary atherosclerosis of native coronary artery 01/14/2013  . CAD, multiple vessel 07/10/2012  . CHEST PAIN UNSPECIFIED 09/04/2009  . CARDIAC MURMUR 07/03/2009  . Type 2 diabetes mellitus (HCC) 09/12/2008  . Hyperlipidemia 09/12/2008  . Essential hypertension 09/12/2008    Past Surgical History:  Procedure Laterality Date  . APPENDECTOMY    . CARDIAC CATHETERIZATION  2008   no angiographically significant CAD  . CARDIAC CATHETERIZATION  01/14/2013  . CARDIOVASCULAR STRESS TEST  12/2009   no evidence for  stress-induced reversibility or ischemia.  She had a fixed anterior septal wall defect, possibly related to breast attenuation and her EF was 54%.  Marland Kitchen CATARACT EXTRACTION W/PHACO Right 10/25/2014   Procedure: CATARACT EXTRACTION PHACO AND INTRAOCULAR LENS PLACEMENT; CDE:  17.28;  Surgeon: Jethro Bolus, MD;  Location: [facility] ORS;  Service: Ophthalmology;  Laterality: Right;  . CATARACT EXTRACTION W/PHACO Left 11/08/2014   Procedure: CATARACT EXTRACTION PHACO AND INTRAOCULAR LENS PLACEMENT (IOC);  Surgeon: Jethro Bolus, MD;  Location: [facility] ORS;  Service: Ophthalmology;  Laterality: Left;  CDE: 5.94  . CHOLECYSTECTOMY    . DILATION AND CURETTAGE OF UTERUS    . LEFT HEART CATHETERIZATION WITH CORONARY ANGIOGRAM N/A 01/14/2013   Procedure: LEFT HEART CATHETERIZATION WITH CORONARY ANGIOGRAM;  Surgeon: Kathleene Hazel, MD;  [facility] LAB;  Service: Cardiovascular;  Laterality: N/A;  . TONSILLECTOMY    . TUBAL LIGATION    . VAGINAL HYSTERECTOMY      OB History    Gravida Para Term Preterm AB Living             2   SAB TAB Ectopic Multiple Live Births                   Home Medications    Prior to Admission medications   Medication Sig Start Date End Date Taking? Authorizing Provider  Alcohol Swabs (B-D SINGLE USE SWABS REGULAR) PADS TEST TWO TIMES DAILY 01/18/16  Yes Jeannett Senior  Paul Half, MD  aspirin 81 MG tablet Take 1 tablet (81 mg total) by mouth daily. 10/05/15  Yes Frederica Kuster, MD  atorvastatin (LIPITOR) 20 MG tablet Take 1 tablet (20 mg total) by mouth daily. 09/11/15  Yes Frederica Kuster, MD  cholecalciferol (VITAMIN D) 1000 UNITS tablet Take 1,000 Units by mouth daily.  07/21/14  Yes Historical Provider, MD  glimepiride (AMARYL) 4 MG tablet TAKE 1 TABLET EVERY MORNING BEFORE A MEAL 02/07/16  Yes Frederica Kuster, MD  hydrALAZINE (APRESOLINE) 10 MG tablet Take 1 tablet (10 mg total) by mouth 2 (two) times daily. 01/10/16  Yes Tammy Eckard, PharmD  Liniments (ORTHOGEL) GEL Apply 1  application topically daily as needed (pain).    Yes Historical Provider, MD  losartan-hydrochlorothiazide (HYZAAR) 50-12.5 MG tablet Take 1 tablet by mouth daily. 11/16/15  Yes Frederica Kuster, MD  metoprolol succinate (TOPROL-XL) 100 MG 24 hr tablet TAKE 1 TABLET EVERY DAY  WITH  OR  IMMEDIATELY  FOLLOWING A MEAL 12/06/15  Yes Frederica Kuster, MD  nitroGLYCERIN (NITROSTAT) 0.4 MG SL tablet PLACE 1 TABLET (0.4 MG TOTAL) UNDER THE TONGUE EVERY 5 (FIVE) MINUTES AS NEEDED. FOR CHEST PAIN 09/22/15  Yes Frederica Kuster, MD  omeprazole (PRILOSEC) 40 MG capsule TAKE 1 CAPSULE EVERY DAY 12/06/15  Yes Frederica Kuster, MD  PRODIGY NO CODING BLOOD GLUC test strip TEST  BLOOD  SUGAR TWICE DAILY 04/04/15  Yes Frederica Kuster, MD  PRODIGY TWIST TOP LANCETS 28G MISC TEST TWICE DAILY 11/22/15  Yes Frederica Kuster, MD  SitaGLIPtin-MetFORMIN HCl (JANUMET XR) 50-500 MG TB24 Take 1 tablet by mouth daily after breakfast. 02/12/16  Yes Frederica Kuster, MD  traMADol (ULTRAM) 50 MG tablet Take 1 tablet (50 mg total) by mouth every 6 (six) hours as needed. 02/12/16   Vanetta Mulders, MD    Family History Family History  Problem Relation Age of Onset  . Hypertension Mother   . Hypertension Father   . Cancer Sister     breast    Social History Social History  Substance Use Topics  . Smoking status: Never Smoker  . Smokeless tobacco: Never Used  . Alcohol use No     Allergies   Patient has no known allergies.   Review of Systems Review of Systems  Constitutional: Negative for fever.  HENT: Negative for congestion.   Eyes: Negative for visual disturbance.  Respiratory: Negative for shortness of breath.   Cardiovascular: Negative for chest pain.  Gastrointestinal: Positive for abdominal pain.  Genitourinary: Negative for dysuria.  Musculoskeletal: Positive for back pain.  Skin: Negative for rash.  Neurological: Negative for syncope and headaches.  Hematological: Does not bruise/bleed easily.    Psychiatric/Behavioral: Negative for confusion.     Physical Exam Updated Vital Signs BP 145/82 (BP Location: Left Arm)   Pulse 78   Temp 98.5 F (36.9 C) (Oral)   Resp 22   Wt 70.8 kg   SpO2 97%   BMI 28.53 kg/m   Physical Exam  Constitutional: She is oriented to person, place, and time. She appears well-developed and well-nourished. No distress.  HENT:  Head: Normocephalic and atraumatic.  Mouth/Throat: Oropharynx is clear and moist.  Eyes: EOM are normal. Pupils are equal, round, and reactive to light.  Neck: Normal range of motion. Neck supple.  Cardiovascular: Normal rate, regular rhythm and normal heart sounds.   Pulmonary/Chest: Effort normal and breath sounds normal. No respiratory distress.  Abdominal: Soft. Bowel sounds  are normal. She exhibits no distension and no mass. There is no tenderness. There is no guarding.  Musculoskeletal: Normal range of motion. She exhibits no edema.  Neurological: She is alert and oriented to person, place, and time. No cranial nerve deficit or sensory deficit. She exhibits normal muscle tone. Coordination normal.  Skin: Skin is warm.  Nursing note and vitals reviewed.    ED Treatments / Results  Labs (all labs ordered are listed, but only abnormal results are displayed) Labs Reviewed  CBC WITH DIFFERENTIAL/PLATELET - Abnormal; Notable for the following:       Result Value   WBC 17.9 (*)    Neutro Abs 13.6 (*)    Monocytes Absolute 1.7 (*)    All other components within normal limits  COMPREHENSIVE METABOLIC PANEL - Abnormal; Notable for the following:    Sodium 134 (*)    Potassium 3.2 (*)    Chloride 96 (*)    Glucose, Bld 187 (*)    Creatinine, Ser 1.04 (*)    Total Protein 8.4 (*)    ALT 12 (*)    GFR calc non Af Amer 54 (*)    All other components within normal limits  URINALYSIS, ROUTINE W REFLEX MICROSCOPIC (NOT AT [facility]) - Abnormal; Notable for the following:    Glucose, UA 100 (*)    All other components within  normal limits  CBG MONITORING, ED - Abnormal; Notable for the following:    Glucose-Capillary 173 (*)    All other components within normal limits  LIPASE, BLOOD    EKG  EKG Interpretation  Date/Time:  Monday February 12 2016 15:34:25 EST Ventricular Rate:  77 PR Interval:    QRS Duration: 169 QT Interval:  442 QTC Calculation: 501 R Axis:   43 Text Interpretation:  Sinus rhythm Left bundle branch block No significant change since last tracing Confirmed by Margart Zemanek  MD, [employer] [phone]) on 02/12/2016 3:52:11 PM       Radiology Dg Lumbar Spine Complete  Result Date: 02/12/2016 CLINICAL DATA:  Fall.  Back pain EXAM: LUMBAR SPINE - COMPLETE 4+ VIEW COMPARISON:  None. FINDINGS: Grade 1 anterolisthesis L4-5. Remaining alignment normal. Negative for fracture or pars defect. Mild disc space narrowing L4-5. Otherwise no significant disc degeneration in the lumbar spine. IMPRESSION: No acute abnormality.  Degenerative listhesis L4-5. Electronically Signed   By: Marlan Palauharles  Clark M.D.   On: 02/12/2016 16:34   Ct Pelvis Wo Contrast  Result Date: 02/12/2016 CLINICAL DATA:  LEFT hip pain. EXAM: CT PELVIS WITHOUT CONTRAST TECHNIQUE: Multidetector CT imaging of the pelvis was performed following the standard protocol without intravenous contrast. COMPARISON:  Acute abdominal series February 12, 2016 at 1602 hours FINDINGS: Urinary Tract: Urinary bladder is well distended, harboring no intravesicular calculi. Bowel:  Moderate amount of included retained large bowel stool. Vascular/Lymphatic: Mild calcific atherosclerosis. Reproductive:  Status post hysterectomy. Other:  No intraperitoneal free fluid or free air within the pelvis. Musculoskeletal: Atrophic RIGHT rectus abdominus muscle with anterior abdominal wall scarring. Mild symmetric sacroiliac osteoarthrosis. Grade 1 L4-5 anterolisthesis, with small broad-based disc osteophyte and severe facet arthropathy results in severe canal stenosis. Severe RIGHT  L4-5 and L5-S1 neural foraminal narrowing. IMPRESSION: No acute fracture. Grade 1 L4-5 anterolisthesis on degenerative basis resulting in severe L4-5 canal stenosis. Severe RIGHT L4-5 and L5-S1 neural foraminal narrowing. Moderate amount of retained large bowel stool. Electronically Signed   By: Awilda Metroourtnay  Bloomer M.D.   On: 02/12/2016 18:33   Dg Abd Acute W/chest  Result Date: 02/12/2016 CLINICAL DATA:  Fall.  Abdominal pain EXAM: DG ABDOMEN ACUTE W/ 1V CHEST COMPARISON:  Chest x-ray 06/23/2014 FINDINGS: Heart size mildly enlarged. Lungs are clear. Negative for heart failure. Normal bowel gas pattern. No renal calculi. Atherosclerotic calcification. Negative for fracture. Degenerative facet disease at L4-5. IMPRESSION: No acute abnormality. Atherosclerotic disease Electronically Signed   By: Marlan Palauharles  Clark M.D.   On: 02/12/2016 16:38   Dg Hip Unilat With Pelvis 2-3 Views Right  Result Date: 02/12/2016 CLINICAL DATA:  Fall.  Pain EXAM: DG HIP (WITH OR WITHOUT PELVIS) 2-3V RIGHT COMPARISON:  None. FINDINGS: Negative for fracture.  Right hip joint normal. Atherosclerotic calcification IMPRESSION: Negative for fracture. Electronically Signed   By: Marlan Palauharles  Clark M.D.   On: 02/12/2016 16:35    Procedures Procedures (including critical care time)  Medications Ordered in ED Medications - No data to display   Initial Impression / Assessment and Plan / ED Course  I have reviewed the triage vital signs and the nursing notes.  Pertinent labs & imaging results that were available during my care of the patient were reviewed by me and considered in my medical decision making (see chart for details).  Clinical Course    Patient's pain is now resolved. Patient did have an episode of pain in the right hip when she stood earlier. X-rays of that area were negative. Concern for possible hairline fracture could is or was a history of fall CT of right hip was ordered when patient got back there she complained  that her left hip was hurting so we did both hips. Both negative. CT also did show a lot of narrowing in the lumbar spine. More so on the right. This could represent a cause for back pain. No neuro deficits. The patient's main complaint was generalized abdominal pain. None has been present while she's been here.  Plain films of her abdomen without any significant abnormalities other than increased stool load.  Will treat patient with MiraLAX.  Noted is a marked leukocytosis without a significant abnormal differential. Not able to explain this. Patient appears nontoxic no acute distress abdomen is nontender urinalysis is negative. Labs otherwise without significant abnormalities. Patient has follow-up with [facility] available to her. We'll have her follow-up for repeat white blood cell count.   Final Clinical Impressions(s) / ED Diagnoses   Final diagnoses:  Pain  Acute bilateral low back pain without sciatica  Generalized abdominal pain    New Prescriptions New Prescriptions   TRAMADOL (ULTRAM) 50 MG TABLET    Take 1 tablet (50 mg total) by mouth every 6 (six) hours as needed.     Vanetta MuldersScott Santiago Stenzel, MD 02/12/16 1857

## 2016-02-12 NOTE — Discharge Instructions (Signed)
Take pain medicine as directed. CT scan showed no evidence of any hip injuries or fractures. But she do have a lot of narrowing in your back bones. This could cause pain. Make an appointment to follow-up with your regular Dr. return for any new or worse symptoms.  It will be important to follow-up with your primary care doctor to have your white blood cell count rechecked. Today it was high. Also CT did show some evidence of constipation take the MiraLAX as directed. Return for any new or worse symptoms.

## 2016-02-12 NOTE — Progress Notes (Signed)
Pt out of Janumet  Pt has appt on Weds with Dr Hyacinth MeekerMiller [facility]

## 2016-02-14 ENCOUNTER — Ambulatory Visit (INDEPENDENT_AMBULATORY_CARE_PROVIDER_SITE_OTHER): Payer: Commercial Managed Care - HMO | Admitting: Family Medicine

## 2016-02-14 ENCOUNTER — Encounter: Payer: Self-pay | Admitting: Family Medicine

## 2016-02-14 VITALS — BP 122/78 | HR 61 | Temp 96.7°F | Ht 62.0 in | Wt 159.0 lb

## 2016-02-14 DIAGNOSIS — I10 Essential (primary) hypertension: Secondary | ICD-10-CM

## 2016-02-14 DIAGNOSIS — D72829 Elevated white blood cell count, unspecified: Secondary | ICD-10-CM

## 2016-02-14 DIAGNOSIS — E1122 Type 2 diabetes mellitus with diabetic chronic kidney disease: Secondary | ICD-10-CM

## 2016-02-14 DIAGNOSIS — N183 Chronic kidney disease, stage 3 unspecified: Secondary | ICD-10-CM

## 2016-02-14 LAB — CBC WITH DIFFERENTIAL/PLATELET
BASOS: 0 %
Basophils Absolute: 0 10*3/uL (ref 0.0–0.2)
EOS (ABSOLUTE): 0 10*3/uL (ref 0.0–0.4)
EOS: 0 %
HEMATOCRIT: 36.9 % (ref 34.0–46.6)
HEMOGLOBIN: 12 g/dL (ref 11.1–15.9)
IMMATURE GRANULOCYTES: 0 %
Immature Grans (Abs): 0 10*3/uL (ref 0.0–0.1)
LYMPHS ABS: 1.5 10*3/uL (ref 0.7–3.1)
Lymphs: 13 %
MCH: 27.1 pg (ref 26.6–33.0)
MCHC: 32.5 g/dL (ref 31.5–35.7)
MCV: 83 fL (ref 79–97)
MONOCYTES: 5 %
Monocytes Absolute: 0.6 10*3/uL (ref 0.1–0.9)
Neutrophils Absolute: 9.5 10*3/uL — ABNORMAL HIGH (ref 1.4–7.0)
Neutrophils: 82 %
Platelets: 243 10*3/uL (ref 150–379)
RBC: 4.43 x10E6/uL (ref 3.77–5.28)
RDW: 13.1 % (ref 12.3–15.4)
WBC: 11.6 10*3/uL — AB (ref 3.4–10.8)

## 2016-02-14 MED ORDER — SITAGLIP PHOS-METFORMIN HCL ER 50-500 MG PO TB24: ORAL_TABLET | ORAL | 1 refills | Status: DC

## 2016-02-14 MED ORDER — HYDRALAZINE HCL 10 MG PO TABS: 10.0000 mg | ORAL_TABLET | Freq: Two times a day (BID) | ORAL | 1 refills | Status: DC

## 2016-02-14 MED ORDER — GLIMEPIRIDE 4 MG PO TABS: ORAL_TABLET | ORAL | 1 refills | Status: DC

## 2016-02-14 MED ORDER — ATORVASTATIN CALCIUM 20 MG PO TABS: 20.0000 mg | ORAL_TABLET | Freq: Every day | ORAL | 1 refills | Status: DC

## 2016-02-14 MED ORDER — LOSARTAN POTASSIUM-HCTZ 50-12.5 MG PO TABS: 1.0000 | ORAL_TABLET | Freq: Every day | ORAL | 1 refills | Status: DC

## 2016-02-14 NOTE — Addendum Note (Signed)
Addended by: Fawn KirkHOLT, CATHY on: 02/14/2016 02:37 PM   Modules accepted: Orders

## 2016-02-14 NOTE — Progress Notes (Signed)
Subjective:    Patient ID: Tonya SimmeringMary W Yun, female    DOB: 05/07/1945, 70 y.o.   MRN: [id number]  HPI 70 year old female here to follow-up from an ER visit. She presented to the ER earlier this week with acute onset of abdominal and back pain which resolved while she was there. Despite multiple scans x-rays and labs no etiology was found. She was noted to have an elevated white blood cell count in the ER requested we follow-up with that. Regarding her sugars she has been on Janumet X are with good results with no sugars being greater than 200 and only one being less than 100 over the past month.  Patient Active Problem List   Diagnosis Date Noted  . GERD (gastroesophageal reflux disease) 10/20/2013  . Frequency 02/18/2013  . Chest pain 01/21/2013  . Precordial pain 01/15/2013  . Coronary atherosclerosis of native coronary artery 01/14/2013  . CAD, multiple vessel 07/10/2012  . CHEST PAIN UNSPECIFIED 09/04/2009  . CARDIAC MURMUR 07/03/2009  . Type 2 diabetes mellitus (HCC) 09/12/2008  . Hyperlipidemia 09/12/2008  . Essential hypertension 09/12/2008   Outpatient Encounter Prescriptions as of 02/14/2016  Medication Sig  . Alcohol Swabs (B-D SINGLE USE SWABS REGULAR) PADS TEST TWO TIMES DAILY  . aspirin 81 MG tablet Take 1 tablet (81 mg total) by mouth daily.  Marland Kitchen. atorvastatin (LIPITOR) 20 MG tablet Take 1 tablet (20 mg total) by mouth daily.  . cholecalciferol (VITAMIN D) 1000 UNITS tablet Take 1,000 Units by mouth daily.   Marland Kitchen. glimepiride (AMARYL) 4 MG tablet TAKE 1 TABLET EVERY MORNING BEFORE A MEAL  . hydrALAZINE (APRESOLINE) 10 MG tablet Take 1 tablet (10 mg total) by mouth 2 (two) times daily.  Marland Kitchen. JANUMET XR 50-500 MG TB24 TAKE (1) TABLET DAILY IN THE MORNING AFTER BREAK- FAST.  Marland Kitchen. Liniments (ORTHOGEL) GEL Apply 1 application topically daily as needed (pain).   Marland Kitchen. losartan-hydrochlorothiazide (HYZAAR) 50-12.5 MG tablet Take 1 tablet by mouth daily.  . metoprolol succinate (TOPROL-XL) 100 MG  24 hr tablet TAKE 1 TABLET EVERY DAY  WITH  OR  IMMEDIATELY  FOLLOWING A MEAL  . nitroGLYCERIN (NITROSTAT) 0.4 MG SL tablet PLACE 1 TABLET (0.4 MG TOTAL) UNDER THE TONGUE EVERY 5 (FIVE) MINUTES AS NEEDED. FOR CHEST PAIN  . omeprazole (PRILOSEC) 40 MG capsule TAKE 1 CAPSULE EVERY DAY  . polyethylene glycol (MIRALAX) packet Take 17 g by mouth daily.  Marland Kitchen. PRODIGY NO CODING BLOOD GLUC test strip TEST  BLOOD  SUGAR TWICE DAILY  . PRODIGY TWIST TOP LANCETS 28G MISC TEST TWICE DAILY  . traMADol (ULTRAM) 50 MG tablet Take 1 tablet (50 mg total) by mouth every 6 (six) hours as needed.   No facility-administered encounter medications on file as of 02/14/2016.       Review of Systems  Constitutional: Negative.   Respiratory: Negative.   Cardiovascular: Negative.   Gastrointestinal: Negative.   Neurological: Negative.   Psychiatric/Behavioral: Negative.        Objective:   Physical Exam  Constitutional: She is oriented to person, place, and time. She appears well-developed and well-nourished.  Cardiovascular: Normal rate, regular rhythm and normal heart sounds.   Pulmonary/Chest: Effort normal and breath sounds normal.  Neurological: She is alert and oriented to person, place, and time.  Psychiatric: She has a normal mood and affect. Her behavior is normal.   BP 122/78   Pulse 61   Temp (!) 96.7 F (35.9 C) (Oral)   Ht 5\' 2"  (1.575 m)  Wt 159 lb (72.1 kg)   BMI 29.08 kg/m         Assessment & Plan:  1. Leukocytosis, unspecified type Follow-up from ER visit. Patient is showing no signs or symptoms of infection - CBC with Differential/Platelet  2. Essential hypertension  Blood pressure fairly well controlled on metoprolol hydralazine and losartan hydrochlorothiazide   3. Type 2 diabetes mellitus with stage 3 chronic kidney disease, without long-term current use of insulin (HCC) As noted above sugars have been good on Janumet X are. She had experienced diarrhea on plain metformin  but apparently that are is better tolerated. Last A1c was only one month ago so too early to check.  Frederica KusterStephen M Miller MD

## 2016-02-15 ENCOUNTER — Telehealth: Payer: Self-pay | Admitting: Family Medicine

## 2016-02-15 NOTE — Telephone Encounter (Signed)
Please review last labs and advise.

## 2016-02-15 NOTE — Telephone Encounter (Signed)
Please call patient with the CBC results. The hemoglobin was good and within normal limits. The white blood cell count remains slightly elevated but is much improved from 3 days ago. The platelet count is adequate.

## 2016-02-15 NOTE — Telephone Encounter (Signed)
Aware. 

## 2016-02-21 ENCOUNTER — Telehealth: Payer: Self-pay | Admitting: Family Medicine

## 2016-02-23 NOTE — Telephone Encounter (Signed)
Pt's next 3 appts were printed and mailed to her

## 2016-03-13 ENCOUNTER — Encounter: Payer: Commercial Managed Care - HMO | Admitting: *Deleted

## 2016-03-13 DIAGNOSIS — Z1231 Encounter for screening mammogram for malignant neoplasm of breast: Secondary | ICD-10-CM | POA: Diagnosis not present

## 2016-03-19 DIAGNOSIS — E119 Type 2 diabetes mellitus without complications: Secondary | ICD-10-CM | POA: Diagnosis not present

## 2016-03-19 DIAGNOSIS — Z961 Presence of intraocular lens: Secondary | ICD-10-CM | POA: Diagnosis not present

## 2016-03-19 DIAGNOSIS — H524 Presbyopia: Secondary | ICD-10-CM | POA: Diagnosis not present

## 2016-03-22 ENCOUNTER — Telehealth: Payer: Self-pay | Admitting: Pharmacist

## 2016-03-22 NOTE — Telephone Encounter (Signed)
Called patient to follow up BG. Patient reports that most of the time BG in 130 to 150's thought she has had come 180's recently - usually related to when she had eaten something high in sugar.    Discussed limiting sugar and CHO intake.   She is to call back is she is getting FBG over 150 or other BG reading over 200 for appt for med adjustment and TLC discussion.  Otherwise plan to f/u with PCP as planned 05/2016

## 2016-04-02 ENCOUNTER — Other Ambulatory Visit: Payer: Self-pay | Admitting: Family Medicine

## 2016-04-02 MED ORDER — SITAGLIP PHOS-METFORMIN HCL ER 50-500 MG PO TB24: ORAL_TABLET | ORAL | 0 refills | Status: DC

## 2016-04-02 NOTE — Telephone Encounter (Signed)
Pt requesting refill on mail order RXs Order sent in per pt request Pt has appt for check up in 05/2016

## 2016-04-05 ENCOUNTER — Telehealth: Payer: Self-pay | Admitting: Family Medicine

## 2016-04-05 MED ORDER — LOSARTAN POTASSIUM-HCTZ 50-12.5 MG PO TABS: 1.0000 | ORAL_TABLET | Freq: Every day | ORAL | 1 refills | Status: DC

## 2016-04-05 MED ORDER — GLIMEPIRIDE 4 MG PO TABS: ORAL_TABLET | ORAL | 1 refills | Status: DC

## 2016-04-05 MED ORDER — BD SWAB SINGLE USE REGULAR PADS: MEDICATED_PAD | 3 refills | Status: DC

## 2016-04-05 MED ORDER — HYDRALAZINE HCL 10 MG PO TABS: 10.0000 mg | ORAL_TABLET | Freq: Two times a day (BID) | ORAL | 1 refills | Status: DC

## 2016-04-05 MED ORDER — GLUCOSE BLOOD VI STRP: ORAL_STRIP | 3 refills | Status: DC

## 2016-04-05 NOTE — Telephone Encounter (Signed)
Pt aware = refills done

## 2016-05-17 ENCOUNTER — Ambulatory Visit: Payer: Commercial Managed Care - HMO | Admitting: Family Medicine

## 2016-05-22 ENCOUNTER — Encounter: Payer: Self-pay | Admitting: Family Medicine

## 2016-05-22 ENCOUNTER — Ambulatory Visit (INDEPENDENT_AMBULATORY_CARE_PROVIDER_SITE_OTHER): Payer: Commercial Managed Care - HMO | Admitting: Family Medicine

## 2016-05-22 VITALS — BP 124/61 | HR 62 | Temp 96.6°F | Ht 62.0 in | Wt 159.8 lb

## 2016-05-22 DIAGNOSIS — E1122 Type 2 diabetes mellitus with diabetic chronic kidney disease: Secondary | ICD-10-CM | POA: Diagnosis not present

## 2016-05-22 DIAGNOSIS — N183 Chronic kidney disease, stage 3 unspecified: Secondary | ICD-10-CM

## 2016-05-22 DIAGNOSIS — I10 Essential (primary) hypertension: Secondary | ICD-10-CM | POA: Diagnosis not present

## 2016-05-22 DIAGNOSIS — E7800 Pure hypercholesterolemia, unspecified: Secondary | ICD-10-CM

## 2016-05-22 LAB — BAYER DCA HB A1C WAIVED: HB A1C: 9.2 % — AB (ref <7.0)

## 2016-05-22 MED ORDER — METOPROLOL SUCCINATE ER 100 MG PO TB24: ORAL_TABLET | ORAL | 1 refills | Status: DC

## 2016-05-22 MED ORDER — OMEPRAZOLE 40 MG PO CPDR: 40.0000 mg | DELAYED_RELEASE_CAPSULE | Freq: Every day | ORAL | 1 refills | Status: DC

## 2016-05-22 MED ORDER — SITAGLIP PHOS-METFORMIN HCL ER 50-500 MG PO TB24: ORAL_TABLET | ORAL | 1 refills | Status: DC

## 2016-05-22 NOTE — Progress Notes (Signed)
Subjective:    Patient ID: Tonya Carpenter, female    DOB: Jul 29, 1945, 71 y.o.   MRN: [id number]  HPI 71 year old female with diabetes and hypertension. There has been some issues with toleration of her medicines particularly metformin but now that she is on combination metformin and Januvia in the long-acting formulation the diarrhea seems to have resolved. She brings in a record of her sugars at home. Generally they have been between 150 and 200. She is also treated for lipids last LDL was at goal. Last A1c was elevated at 9.9.  Patient Active Problem List   Diagnosis Date Noted  . GERD (gastroesophageal reflux disease) 10/20/2013  . Frequency 02/18/2013  . Chest pain 01/21/2013  . Precordial pain 01/15/2013  . Coronary atherosclerosis of native coronary artery 01/14/2013  . CAD, multiple vessel 07/10/2012  . CHEST PAIN UNSPECIFIED 09/04/2009  . CARDIAC MURMUR 07/03/2009  . Type 2 diabetes mellitus ([place]) 09/12/2008  . Hyperlipidemia 09/12/2008  . Essential hypertension 09/12/2008   Outpatient Encounter Prescriptions as of 05/22/2016  Medication Sig  . Alcohol Swabs (B-D SINGLE USE SWABS REGULAR) PADS TEST TWO TIMES DAILY  . aspirin 81 MG tablet Take 1 tablet (81 mg total) by mouth daily.  Marland Kitchen atorvastatin (LIPITOR) 20 MG tablet Take 1 tablet (20 mg total) by mouth daily.  . cholecalciferol (VITAMIN D) 1000 UNITS tablet Take 1,000 Units by mouth daily.   Marland Kitchen glimepiride (AMARYL) 4 MG tablet TAKE 1 TABLET EVERY MORNING BEFORE A MEAL  . glucose blood (PRODIGY NO CODING BLOOD GLUC) test strip TEST  BLOOD  SUGAR TWICE DAILY  . hydrALAZINE (APRESOLINE) 10 MG tablet Take 1 tablet (10 mg total) by mouth 2 (two) times daily.  . Liniments (ORTHOGEL) GEL Apply 1 application topically daily as needed (pain).   Marland Kitchen losartan-hydrochlorothiazide (HYZAAR) 50-12.5 MG tablet Take 1 tablet by mouth daily.  . metoprolol succinate (TOPROL-XL) 100 MG 24 hr tablet TAKE 1 TABLET EVERY DAY  WITH  OR  IMMEDIATELY   FOLLOWING A MEAL  . omeprazole (PRILOSEC) 40 MG capsule Take 1 capsule (40 mg total) by mouth daily.  . polyethylene glycol (MIRALAX) packet Take 17 g by mouth daily.  Marland Kitchen PRODIGY TWIST TOP LANCETS 28G MISC TEST TWICE DAILY  . SitaGLIPtin-MetFORMIN HCl (JANUMET XR) 50-500 MG TB24 TAKE (1) TABLET DAILY IN THE MORNING AFTER BREAK- FAST.  . [DISCONTINUED] metoprolol succinate (TOPROL-XL) 100 MG 24 hr tablet TAKE 1 TABLET EVERY DAY  WITH  OR  IMMEDIATELY  FOLLOWING A MEAL  . [DISCONTINUED] omeprazole (PRILOSEC) 40 MG capsule TAKE 1 CAPSULE EVERY DAY  . [DISCONTINUED] SitaGLIPtin-MetFORMIN HCl (JANUMET XR) 50-500 MG TB24 TAKE (1) TABLET DAILY IN THE MORNING AFTER BREAK- FAST.  Marland Kitchen nitroGLYCERIN (NITROSTAT) 0.4 MG SL tablet PLACE 1 TABLET (0.4 MG TOTAL) UNDER THE TONGUE EVERY 5 (FIVE) MINUTES AS NEEDED. FOR CHEST PAIN (Patient not taking: Reported on 05/22/2016)  . traMADol (ULTRAM) 50 MG tablet Take 1 tablet (50 mg total) by mouth every 6 (six) hours as needed. (Patient not taking: Reported on 05/22/2016)   No facility-administered encounter medications on file as of 05/22/2016.       Review of Systems  Constitutional: Negative.   Respiratory: Negative.   Cardiovascular: Negative.   Gastrointestinal: Negative.        Objective:   Physical Exam  Constitutional: She is oriented to person, place, and time. She appears well-developed and well-nourished.  Cardiovascular: Normal rate and regular rhythm.   Murmur heard. Pulmonary/Chest: Effort normal and  breath sounds normal.  Neurological: She is alert and oriented to person, place, and time.  Psychiatric: She has a normal mood and affect. Her behavior is normal.   BP 124/61   Pulse 62   Temp (!) 96.6 F (35.9 C) (Oral)   Ht _0  (1.575 m)   Wt 159 lb 12.8 oz (72.5 kg)   BMI 29.23 kg/m         Assessment & Plan:  1. Essential hypertension Blood pressures are controlled on hydralazine and losartan and metoprolol - CMP14+EGFR  2. Type 2  diabetes mellitus with stage 3 chronic kidney disease, without long-term current use of insulin (HCC) I would anticipate A1c still being elevated but improved from 9.9 when last checked - Bayer DCA Hb A1c Waived  3. Pure hypercholesterolemia Anticipate good control of lipids on atorvastatin 20 mg - Lipid panel  Wardell Honour MD

## 2016-05-23 LAB — CMP14+EGFR
A/G RATIO: 1.2 (ref 1.2–2.2)
ALK PHOS: 64 IU/L (ref 39–117)
ALT: 14 IU/L (ref 0–32)
AST: 20 IU/L (ref 0–40)
Albumin: 4.1 g/dL (ref 3.6–4.8)
BILIRUBIN TOTAL: 0.4 mg/dL (ref 0.0–1.2)
BUN/Creatinine Ratio: 11 — ABNORMAL LOW (ref 12–28)
BUN: 12 mg/dL (ref 8–27)
CHLORIDE: 101 mmol/L (ref 96–106)
CO2: 30 mmol/L — ABNORMAL HIGH (ref 18–29)
Calcium: 9.7 mg/dL (ref 8.7–10.3)
Creatinine, Ser: 1.14 mg/dL — ABNORMAL HIGH (ref 0.57–1.00)
GFR calc non Af Amer: 49 mL/min/{1.73_m2} — ABNORMAL LOW (ref >59)
GFR, EST AFRICAN AMERICAN: 57 mL/min/{1.73_m2} — AB (ref >59)
GLOBULIN, TOTAL: 3.3 g/dL (ref 1.5–4.5)
GLUCOSE: 140 mg/dL — AB (ref 65–99)
POTASSIUM: 3.9 mmol/L (ref 3.5–5.2)
SODIUM: 144 mmol/L (ref 134–144)
Total Protein: 7.4 g/dL (ref 6.0–8.5)

## 2016-05-23 LAB — LIPID PANEL
CHOLESTEROL TOTAL: 138 mg/dL (ref 100–199)
Chol/HDL Ratio: 2.8 ratio units (ref 0.0–4.4)
HDL: 49 mg/dL (ref >39)
LDL Calculated: 68 mg/dL (ref 0–99)
Triglycerides: 107 mg/dL (ref 0–149)
VLDL CHOLESTEROL CAL: 21 mg/dL (ref 5–40)

## 2016-05-31 ENCOUNTER — Telehealth: Payer: Self-pay | Admitting: Family Medicine

## 2016-05-31 NOTE — Telephone Encounter (Signed)
Need to know which medications she needs refills on [facility]

## 2016-06-05 ENCOUNTER — Other Ambulatory Visit: Payer: Self-pay | Admitting: Family Medicine

## 2016-06-11 ENCOUNTER — Other Ambulatory Visit: Payer: Self-pay | Admitting: *Deleted

## 2016-06-12 MED ORDER — ATORVASTATIN CALCIUM 20 MG PO TABS: 20.0000 mg | ORAL_TABLET | Freq: Every day | ORAL | 1 refills | Status: DC

## 2016-06-12 MED ORDER — GLIMEPIRIDE 4 MG PO TABS: ORAL_TABLET | ORAL | 1 refills | Status: DC

## 2016-06-12 MED ORDER — LOSARTAN POTASSIUM-HCTZ 50-12.5 MG PO TABS: 1.0000 | ORAL_TABLET | Freq: Every day | ORAL | 1 refills | Status: DC

## 2016-06-12 MED ORDER — HYDRALAZINE HCL 10 MG PO TABS: 10.0000 mg | ORAL_TABLET | Freq: Two times a day (BID) | ORAL | 1 refills | Status: DC

## 2016-06-12 NOTE — Progress Notes (Signed)
Okay at current level for 6 mos. Thanks ws 

## 2016-06-12 NOTE — Progress Notes (Signed)
Patient aware, scripts sent to mail order.

## 2016-06-28 ENCOUNTER — Other Ambulatory Visit: Payer: Self-pay | Admitting: Family Medicine

## 2016-07-12 ENCOUNTER — Ambulatory Visit: Payer: Commercial Managed Care - HMO | Admitting: Family Medicine

## 2016-07-24 ENCOUNTER — Other Ambulatory Visit: Payer: Self-pay

## 2016-07-24 MED ORDER — TRUE METRIX LEVEL 2 NORMAL VI SOLN: 3 refills | Status: DC

## 2016-07-24 MED ORDER — BD SWAB SINGLE USE REGULAR PADS: MEDICATED_PAD | 3 refills | Status: DC

## 2016-07-24 MED ORDER — GLUCOSE BLOOD VI STRP: ORAL_STRIP | 3 refills | Status: DC

## 2016-07-24 MED ORDER — TRUEPLUS LANCETS 33G MISC: 3 refills | Status: DC

## 2016-08-01 ENCOUNTER — Other Ambulatory Visit: Payer: Self-pay | Admitting: Family Medicine

## 2016-08-23 ENCOUNTER — Ambulatory Visit: Payer: Commercial Managed Care - HMO | Admitting: Family Medicine

## 2016-08-26 ENCOUNTER — Telehealth: Payer: Self-pay | Admitting: Family Medicine

## 2016-08-26 ENCOUNTER — Encounter: Payer: Self-pay | Admitting: Family Medicine

## 2016-08-27 ENCOUNTER — Other Ambulatory Visit: Payer: Self-pay | Admitting: Family Medicine

## 2016-08-29 NOTE — Telephone Encounter (Signed)
Patient reports someone was to cancel this appointment for her.

## 2016-11-12 ENCOUNTER — Other Ambulatory Visit: Payer: Self-pay | Admitting: Family Medicine

## 2016-11-13 NOTE — Telephone Encounter (Signed)
Next OV 01/13/17

## 2016-11-19 ENCOUNTER — Other Ambulatory Visit: Payer: Self-pay | Admitting: Family Medicine

## 2016-11-26 ENCOUNTER — Encounter: Payer: Self-pay | Admitting: *Deleted

## 2016-12-30 ENCOUNTER — Telehealth: Payer: Self-pay | Admitting: *Deleted

## 2016-12-30 NOTE — Telephone Encounter (Signed)
Patient called requesting appointment with Dr. Louanne Skye for cramping in her feet.  Scheduled her for tomorrow at 9:55 am.

## 2016-12-31 ENCOUNTER — Ambulatory Visit: Payer: Self-pay | Admitting: Family Medicine

## 2016-12-31 ENCOUNTER — Encounter: Payer: Self-pay | Admitting: Family Medicine

## 2017-01-13 ENCOUNTER — Ambulatory Visit (INDEPENDENT_AMBULATORY_CARE_PROVIDER_SITE_OTHER): Payer: Medicare HMO | Admitting: Family Medicine

## 2017-01-13 ENCOUNTER — Encounter: Payer: Self-pay | Admitting: Family Medicine

## 2017-01-13 VITALS — BP 129/73 | HR 62 | Temp 97.1°F | Ht 62.0 in | Wt 161.0 lb

## 2017-01-13 DIAGNOSIS — E1169 Type 2 diabetes mellitus with other specified complication: Secondary | ICD-10-CM | POA: Diagnosis not present

## 2017-01-13 DIAGNOSIS — N183 Chronic kidney disease, stage 3 unspecified: Secondary | ICD-10-CM

## 2017-01-13 DIAGNOSIS — I10 Essential (primary) hypertension: Secondary | ICD-10-CM

## 2017-01-13 DIAGNOSIS — E1122 Type 2 diabetes mellitus with diabetic chronic kidney disease: Secondary | ICD-10-CM

## 2017-01-13 DIAGNOSIS — Z23 Encounter for immunization: Secondary | ICD-10-CM

## 2017-01-13 DIAGNOSIS — E785 Hyperlipidemia, unspecified: Secondary | ICD-10-CM | POA: Diagnosis not present

## 2017-01-13 DIAGNOSIS — K2100 Gastro-esophageal reflux disease with esophagitis, without bleeding: Secondary | ICD-10-CM

## 2017-01-13 DIAGNOSIS — E1159 Type 2 diabetes mellitus with other circulatory complications: Secondary | ICD-10-CM | POA: Diagnosis not present

## 2017-01-13 LAB — BAYER DCA HB A1C WAIVED: HB A1C: 7.9 % — AB (ref <7.0)

## 2017-01-13 MED ORDER — SITAGLIP PHOS-METFORMIN HCL ER 50-500 MG PO TB24: 50.0000 mg | ORAL_TABLET | Freq: Two times a day (BID) | ORAL | 2 refills | Status: DC

## 2017-01-13 NOTE — Progress Notes (Signed)
BP 129/73   Pulse 62   Temp (!) 97.1 F (36.2 C) (Oral)   Ht _0  (1.575 m)   Wt 161 lb (73 kg)   BMI 29.45 kg/m    Subjective:    Patient ID: Tonya Carpenter, female    DOB: May 10, 1945, 71 y.o.   MRN: [id number]  HPI: Tonya Carpenter is a 71 y.o. female presenting on 01/13/2017 for Diabetes (6 mo; is she supposed to take Janumet once or twice daily?); Hyperlipidemia; and Hypertension   HPI Type 2 diabetes mellitus Patient comes in today for recheck of his diabetes. Patient has been currently taking Janumet and glimepiride. Patient is currently on an ACE inhibitor/ARB. Patient has not seen an ophthalmologist this year. Patient denies any issues with their feet.   Hyperlipidemia Patient is coming in for recheck of his hyperlipidemia. The patient is currently taking Lipitor. They deny any issues with myalgias or history of liver damage from it. They deny any focal numbness or weakness or chest pain.   Hypertension Patient is currently on losartan-hctz and hydralazine and metoprolol, and their blood pressure today is 129/73. Patient denies any lightheadedness or dizziness. Patient denies headaches, blurred vision, chest pains, shortness of breath, or weakness. Denies any side effects from medication and is content with current medication.   GERD Patient is currently on omeprazole.  She denies any major symptoms or abdominal pain or belching or burping. She denies any blood in her stool or lightheadedness or dizziness.   Relevant past medical, surgical, family and social history reviewed and updated as indicated. Interim medical history since our last visit reviewed. Allergies and medications reviewed and updated.  Review of Systems  Constitutional: Negative for chills and fever.  HENT: Negative for congestion, ear discharge and ear pain.   Eyes: Negative for redness and visual disturbance.  Respiratory: Negative for chest tightness and shortness of breath.   Cardiovascular: Negative for  chest pain and leg swelling.  Gastrointestinal: Negative for abdominal pain and constipation.  Musculoskeletal: Negative for back pain and gait problem.  Skin: Negative for rash.  Neurological: Negative for dizziness, weakness, light-headedness, numbness and headaches.  Psychiatric/Behavioral: Negative for agitation and behavioral problems.  All other systems reviewed and are negative.   Per HPI unless specifically indicated above      Objective:    BP 129/73   Pulse 62   Temp (!) 97.1 F (36.2 C) (Oral)   Ht _1  (1.575 m)   Wt 161 lb (73 kg)   BMI 29.45 kg/m   Wt Readings from Last 3 Encounters:  01/13/17 161 lb (73 kg)  05/22/16 159 lb 12.8 oz (72.5 kg)  02/14/16 159 lb (72.1 kg)    Physical Exam  Constitutional: She is oriented to person, place, and time. She appears well-developed and well-nourished. No distress.  Eyes: Conjunctivae are normal.  Neck: Neck supple. No thyromegaly present.  Cardiovascular: Normal rate, regular rhythm, normal heart sounds and intact distal pulses.   No murmur heard. Pulmonary/Chest: Effort normal and breath sounds normal. No respiratory distress. She has no wheezes. She has no rales.  Abdominal: Soft. Bowel sounds are normal. She exhibits no distension. There is no tenderness. There is no rebound and no guarding.  Musculoskeletal: Normal range of motion. She exhibits no edema.  Lymphadenopathy:    She has no cervical adenopathy.  Neurological: She is alert and oriented to person, place, and time. Coordination normal.  Skin: Skin is warm and dry. No rash noted.  She is not diaphoretic.  Psychiatric: She has a normal mood and affect. Her behavior is normal.  Nursing note and vitals reviewed.     Assessment & Plan:   Problem List Items Addressed This Visit      Cardiovascular and Mediastinum   Hypertension associated with diabetes ([place])   Relevant Medications   SitaGLIPtin-MetFORMIN HCl (JANUMET XR) 50-500 MG TB24   Other Relevant  Orders   CMP14+EGFR (Completed)     Digestive   GERD (gastroesophageal reflux disease)     Endocrine   Type 2 diabetes mellitus (HCC) - Primary   Relevant Medications   SitaGLIPtin-MetFORMIN HCl (JANUMET XR) 50-500 MG TB24   Other Relevant Orders   Bayer DCA Hb A1c Waived (Completed)   CMP14+EGFR (Completed)   Microalbumin / creatinine urine ratio (Completed)   Hyperlipidemia associated with type 2 diabetes mellitus (HCC)   Relevant Medications   SitaGLIPtin-MetFORMIN HCl (JANUMET XR) 50-500 MG TB24   Other Relevant Orders   Lipid panel (Completed)    Other Visit Diagnoses    Need for immunization against influenza       Relevant Orders   Flu Vaccine QUAD 36+ mos IM (Completed)       Follow up plan: Return in about 3 months (around 04/15/2017), or if symptoms worsen or fail to improve, for diabetes.  Counseling provided for all of the vaccine components Orders Placed This Encounter  Procedures  . Bayer DCA Hb A1c Waived  . CMP14+EGFR  . Lipid panel  . Microalbumin / creatinine urine ratio    Tonya Pina, MD [facility] 01/13/2017, 12:05 PM

## 2017-01-14 ENCOUNTER — Encounter: Payer: Self-pay | Admitting: *Deleted

## 2017-01-14 ENCOUNTER — Ambulatory Visit (INDEPENDENT_AMBULATORY_CARE_PROVIDER_SITE_OTHER): Payer: Medicare HMO | Admitting: *Deleted

## 2017-01-14 VITALS — BP 114/67 | HR 62 | Ht 62.0 in | Wt 161.0 lb

## 2017-01-14 DIAGNOSIS — Z0000 Encounter for general adult medical examination without abnormal findings: Secondary | ICD-10-CM | POA: Diagnosis not present

## 2017-01-14 LAB — CMP14+EGFR
A/G RATIO: 1.2 (ref 1.2–2.2)
ALK PHOS: 60 IU/L (ref 39–117)
ALT: 10 IU/L (ref 0–32)
AST: 15 IU/L (ref 0–40)
Albumin: 4.1 g/dL (ref 3.6–4.8)
BILIRUBIN TOTAL: 0.5 mg/dL (ref 0.0–1.2)
BUN / CREAT RATIO: 11 — AB (ref 12–28)
BUN: 14 mg/dL (ref 8–27)
CHLORIDE: 96 mmol/L (ref 96–106)
CO2: 28 mmol/L (ref 20–29)
Calcium: 9.9 mg/dL (ref 8.7–10.3)
Creatinine, Ser: 1.28 mg/dL — ABNORMAL HIGH (ref 0.57–1.00)
GFR calc non Af Amer: 43 mL/min/{1.73_m2} — ABNORMAL LOW (ref >59)
GFR, EST AFRICAN AMERICAN: 49 mL/min/{1.73_m2} — AB (ref >59)
GLUCOSE: 103 mg/dL — AB (ref 65–99)
Globulin, Total: 3.5 g/dL (ref 1.5–4.5)
POTASSIUM: 3.8 mmol/L (ref 3.5–5.2)
Sodium: 138 mmol/L (ref 134–144)
TOTAL PROTEIN: 7.6 g/dL (ref 6.0–8.5)

## 2017-01-14 LAB — LIPID PANEL
CHOLESTEROL TOTAL: 138 mg/dL (ref 100–199)
Chol/HDL Ratio: 2.6 ratio (ref 0.0–4.4)
HDL: 54 mg/dL (ref >39)
LDL Calculated: 65 mg/dL (ref 0–99)
Triglycerides: 97 mg/dL (ref 0–149)
VLDL CHOLESTEROL CAL: 19 mg/dL (ref 5–40)

## 2017-01-14 LAB — MICROALBUMIN / CREATININE URINE RATIO
CREATININE, UR: 82.3 mg/dL
MICROALB/CREAT RATIO: 16.6 mg/g{creat} (ref 0.0–30.0)
MICROALBUM., U, RANDOM: 13.7 ug/mL

## 2017-01-14 NOTE — Patient Instructions (Addendum)
  Ms. Darrol JumpRedd , Thank you for taking time to come for your Medicare Wellness Visit. I appreciate your ongoing commitment to your health goals. Please review the following plan we discussed and let me know if I can assist you in the future.   These are the goals we discussed: Goals    . Exercise 150 minutes per week (moderate activity)       This is a list of the screening recommended for you and due dates:  Health Maintenance  Topic Date Due  . DEXA scan (bone density measurement)  02/20/2012  . Complete foot exam   10/03/2015  . Eye exam for diabetics  10/04/2015  . Mammogram  03/13/2017  . Hemoglobin A1C  07/14/2017  . Colon Cancer Screening  10/21/2018  . Tetanus Vaccine  07/20/2023  . Flu Shot  Completed  .  Hepatitis C: One time screening is recommended by Center for Disease Control  (CDC) for  adults born from [birth year] through 1965.   Completed  . Pneumonia vaccines  Completed

## 2017-01-29 NOTE — Progress Notes (Signed)
Subjective:   Tonya Carpenter is a 71 y.o. female who presents for a subsequent Medicare Annual Wellness Visit. Tonya Carpenter lives in an apartment with her husband. They do not drive but are able to walk to local destinations without a problem. Her husband is also here for a wellness visit and accompanies her today.   Review of Systems    Health is about the same as last year.   Cardiac Risk Factors include: advanced age (>10355men, 41>65 women);diabetes mellitus;dyslipidemia;hypertension  Other systems negative today.     Objective:    Today's Vitals   01/14/17 1115  BP: 114/67  Pulse: 62  Weight: 161 lb (73 kg)  Height: 5\' 2"  (1.575 m)   Body mass index is 29.45 kg/m.   Current Medications (verified) Outpatient Encounter Medications as of 01/14/2017  Medication Sig  . acetaminophen (TYLENOL) 500 MG tablet Take 1,000 mg by mouth every 6 (six) hours as needed.  . Alcohol Swabs (B-D SINGLE USE SWABS REGULAR) PADS TEST TWO TIMES DAILY  . aspirin 81 MG tablet Take 1 tablet (81 mg total) by mouth daily.  Marland Kitchen. atorvastatin (LIPITOR) 20 MG tablet Take 1 tablet (20 mg total) by mouth daily.  . Blood Glucose Calibration (TRUE METRIX LEVEL 2) Normal SOLN Use as needed with BS machine  . cholecalciferol (VITAMIN D) 1000 UNITS tablet Take 1,000 Units by mouth daily.   Marland Kitchen. glimepiride (AMARYL) 4 MG tablet TAKE 1 TABLET EVERY MORNING BEFORE A MEAL  . glucose blood (PRODIGY NO CODING BLOOD GLUC) test strip TEST  BLOOD  SUGAR TWICE DAILY  . hydrALAZINE (APRESOLINE) 10 MG tablet Take 1 tablet (10 mg total) by mouth 2 (two) times daily.  . Liniments (ORTHOGEL) GEL Apply 1 application topically daily as needed (pain).   Marland Kitchen. losartan-hydrochlorothiazide (HYZAAR) 50-12.5 MG tablet Take 1 tablet by mouth daily.  . metoprolol succinate (TOPROL-XL) 100 MG 24 hr tablet TAKE 1 TABLET EVERY DAY  WITH  OR  IMMEDIATELY  FOLLOWING A MEAL  . nitroGLYCERIN (NITROSTAT) 0.4 MG SL tablet PLACE 1 TABLET (0.4 MG TOTAL) UNDER THE  TONGUE EVERY 5 (FIVE) MINUTES AS NEEDED. FOR CHEST PAIN  . omeprazole (PRILOSEC) 40 MG capsule TAKE 1 CAPSULE EVERY DAY  . PRODIGY TWIST TOP LANCETS 28G MISC TEST TWICE DAILY  . SitaGLIPtin-MetFORMIN HCl (JANUMET XR) 50-500 MG TB24 Take 50-500 mg by mouth 2 (two) times daily.  . TRUE METRIX BLOOD GLUCOSE TEST test strip CHECK BLOOD SUGAR TWICE DAILY  AND AS NEEDED  . TRUEPLUS LANCETS 33G MISC TEST BLOOD SUGAR TWICE DAILY  AND AS NEEDED   No facility-administered encounter medications on file as of 01/14/2017.     Allergies (verified) Patient has no known allergies.   History: Past Medical History:  Diagnosis Date  . Anxiety   . Arthritis    "hands" (01/14/2013)  . Asthma   . Cataract   . Coronary artery disease   . Exertional shortness of breath   . Hyperlipidemia   . Hypertension   . Myocardial infarction [facility](HCC)    "several years back" (01/14/2013)  . Stroke [facility](HCC)    "light stroke long years ago; didn't affect me permanently" (01/14/2013)  . Type 2 diabetes mellitus (HCC)    Past Surgical History:  Procedure Laterality Date  . APPENDECTOMY    . CARDIAC CATHETERIZATION  2008   no angiographically significant CAD  . CARDIAC CATHETERIZATION  01/14/2013  . CARDIOVASCULAR STRESS TEST  12/2009   no evidence for stress-induced reversibility  or ischemia.  She had a fixed anterior septal wall defect, possibly related to breast attenuation and her EF was 54%.  . CHOLECYSTECTOMY    . DILATION AND CURETTAGE OF UTERUS    . TONSILLECTOMY    . TUBAL LIGATION    . VAGINAL HYSTERECTOMY     Family History  Problem Relation Age of Onset  . Hypertension Mother   . Hypertension Father   . Cancer Sister        breast   Social History   Occupational History  . Occupation: Disability    Employer: UNEMPLOYED  Tobacco Use  . Smoking status: Never Smoker  . Smokeless tobacco: Never Used  Substance and Sexual Activity  . Alcohol use: No  . Drug use: No  . Sexual activity: No     Birth control/protection: Surgical    Tobacco Counseling No tobacco use  Activities of Daily Living In your present state of health, do you have any difficulty performing the following activities: 01/14/2017  Hearing? N  Vision? N  Difficulty concentrating or making decisions? N  Walking or climbing stairs? N  Dressing or bathing? N  Doing errands, shopping? N  Preparing Food and eating ? N  Using the Toilet? N  In the past six months, have you accidently leaked urine? N  Do you have problems with loss of bowel control? N  Managing your Medications? N  Managing your Finances? N  Housekeeping or managing your Housekeeping? N  Some recent data might be hidden    Immunizations and Health Maintenance Immunization History  Administered Date(s) Administered  . Influenza,inj,Quad PF,6+ Mos 12/18/2012, 02/02/2014, 01/03/2015, 01/10/2016, 01/13/2017  . Pneumococcal Conjugate-13 12/23/2012  . Pneumococcal Polysaccharide-23 05/25/2014  . Tdap 07/19/2013  . Zoster 06/04/2013   Health Maintenance Due  Topic Date Due  . DEXA SCAN  02/20/2012  . FOOT EXAM  10/03/2015  . OPHTHALMOLOGY EXAM  10/04/2015    Patient Care Team: [name], Elige Radon, MD as PCP - General (Family Medicine) West Bali, MD as Consulting Physician (Gastroenterology) Jethro Bolus, MD as Consulting Physician (Ophthalmology)  No hospitalizations, ER visits, or surgeries this past year.      Assessment:   This is a routine wellness examination for [facility].   Hearing/Vision screen No deficits noted during visit. Eye exam is overdue but she is scheduled.   Dietary issues and exercise activities discussed: Current Exercise Habits: Home exercise routine, Type of exercise: walking, Time (Minutes): 45, Frequency (Times/Week): 7, Weekly Exercise (Minutes/Week): 315, Intensity: Mild, Exercise limited by: None identified  Goals Stay active with a goal of at least 150 min of moderate activity a week  Depression  Screen PHQ 2/9 Scores 01/14/2017 01/13/2017 05/22/2016 02/14/2016 01/12/2016 01/10/2016 09/11/2015  PHQ - 2 Score 0 0 - 1 1 2  0  PHQ- 9 Score - - - - - 5 -  Exception Documentation - - Other- indicate reason in comment box - - - -  Not completed - - lost mom 05/02/16 just had funeral this past weekend - - - -    Fall Risk Fall Risk  01/14/2017 01/13/2017 02/14/2016 01/12/2016 01/10/2016  Falls in the past year? No No Yes Yes Yes  Number falls in past yr: - - 2 or more 1 1  Injury with Fall? - - No Yes Yes  Risk Factor Category  - - - - High Fall Risk  Risk for fall due to : - - - - History of fall(s)  Follow up - - - -  Falls prevention discussed    Cognitive Function: MMSE - Mini Mental State Exam 01/14/2017 01/10/2016  Orientation to time 3 5  Orientation to Place 4 5  Registration 3 3  Attention/ Calculation 0 -  Recall 2 0  Language- name 2 objects 2 2  Language- repeat 0 1  Language- follow 3 step command 3 3  Language- read & follow direction 1 1  Write a sentence 1 1  Copy design 0 1  Total score 19 -  Unable to complete properly due to lack of comprehension      Screening Tests Health Maintenance  Topic Date Due  . DEXA SCAN  02/20/2012  . FOOT EXAM  10/03/2015  . OPHTHALMOLOGY EXAM  10/04/2015  . MAMMOGRAM  03/13/2017  . HEMOGLOBIN A1C  07/14/2017  . COLONOSCOPY  10/21/2018  . TETANUS/TDAP  07/20/2023  . INFLUENZA VACCINE  Completed  . Hepatitis C Screening  Completed  . PNA vac Low Risk Adult  Completed      Plan:  Recommended bone density scan Have eye exam report sent to our office Continue to stay active Move carefully to avoid falls Keep f/u with PCP  I have personally reviewed and noted the following in the patient's chart:   . Medical and social history . Use of alcohol, tobacco or illicit drugs  . Current medications and supplements . Functional ability and status . Nutritional status . Physical activity . Advanced directives . List of  other physicians . Hospitalizations, surgeries, and ER visits in previous 12 months . Vitals . Screenings to include cognitive, depression, and falls . Referrals and appointments  In addition, I have reviewed and discussed with patient certain preventive protocols, quality metrics, and best practice recommendations. A written personalized care plan for preventive services as well as general preventive health recommendations were provided to patient.     Demetrios LollKristen Kennis Buell, RN   01/15/2017

## 2017-01-30 ENCOUNTER — Other Ambulatory Visit: Payer: Self-pay | Admitting: Family Medicine

## 2017-03-06 ENCOUNTER — Other Ambulatory Visit: Payer: Self-pay | Admitting: Family Medicine

## 2017-03-25 DIAGNOSIS — H524 Presbyopia: Secondary | ICD-10-CM | POA: Diagnosis not present

## 2017-03-25 DIAGNOSIS — Z961 Presence of intraocular lens: Secondary | ICD-10-CM | POA: Diagnosis not present

## 2017-03-25 DIAGNOSIS — E119 Type 2 diabetes mellitus without complications: Secondary | ICD-10-CM | POA: Diagnosis not present

## 2017-03-25 DIAGNOSIS — Z7984 Long term (current) use of oral hypoglycemic drugs: Secondary | ICD-10-CM | POA: Diagnosis not present

## 2017-04-09 ENCOUNTER — Telehealth: Payer: Self-pay | Admitting: *Deleted

## 2017-04-09 MED ORDER — POLYETHYLENE GLYCOL 3350 17 GM/SCOOP PO POWD: 17.0000 g | Freq: Two times a day (BID) | ORAL | 1 refills | Status: DC | PRN

## 2017-04-09 NOTE — Telephone Encounter (Signed)
Fax received Rf Polyethylene Glycol Medication not on current list Please advise [facility] mail order

## 2017-04-15 DIAGNOSIS — Z1231 Encounter for screening mammogram for malignant neoplasm of breast: Secondary | ICD-10-CM | POA: Diagnosis not present

## 2017-04-16 ENCOUNTER — Encounter: Payer: Self-pay | Admitting: Family Medicine

## 2017-04-16 ENCOUNTER — Ambulatory Visit (INDEPENDENT_AMBULATORY_CARE_PROVIDER_SITE_OTHER): Payer: Medicare HMO | Admitting: Family Medicine

## 2017-04-16 VITALS — BP 116/71 | HR 68 | Temp 96.9°F | Ht 62.0 in | Wt 161.0 lb

## 2017-04-16 DIAGNOSIS — N183 Chronic kidney disease, stage 3 (moderate): Secondary | ICD-10-CM

## 2017-04-16 DIAGNOSIS — E785 Hyperlipidemia, unspecified: Secondary | ICD-10-CM | POA: Diagnosis not present

## 2017-04-16 DIAGNOSIS — E1159 Type 2 diabetes mellitus with other circulatory complications: Secondary | ICD-10-CM

## 2017-04-16 DIAGNOSIS — I10 Essential (primary) hypertension: Secondary | ICD-10-CM

## 2017-04-16 DIAGNOSIS — K2100 Gastro-esophageal reflux disease with esophagitis, without bleeding: Secondary | ICD-10-CM

## 2017-04-16 DIAGNOSIS — E1169 Type 2 diabetes mellitus with other specified complication: Secondary | ICD-10-CM

## 2017-04-16 DIAGNOSIS — E1122 Type 2 diabetes mellitus with diabetic chronic kidney disease: Secondary | ICD-10-CM | POA: Diagnosis not present

## 2017-04-16 LAB — BAYER DCA HB A1C WAIVED: HB A1C: 6.4 % (ref <7.0)

## 2017-04-16 NOTE — Progress Notes (Signed)
BP 116/71   Pulse 68   Temp (!) 96.9 F (36.1 C) (Oral)   Ht 5' 2"  (1.575 m)   Wt 161 lb (73 kg)   BMI 29.45 kg/m    Subjective:    Patient ID: Tonya Carpenter, female    DOB: [birth year], 72 y.o.   MRN: [id number]  HPI: Tonya Carpenter is a 72 y.o. female presenting on 04/16/2017 for Diabetes (3 mo); Hyperlipidemia; and Hypertension   HPI Type 2 diabetes mellitus Patient comes in today for recheck of his diabetes. Patient has been currently taking Amaryl and Janumet, her blood sugars at home for the most part have been running between 90 and 170 although she does have an occasional outlier above 190 and she had 2 blood sugars in the 50s 2 months ago but none since. Patient is currently on an ACE inhibitor/ARB. Patient has seen an ophthalmologist this year. Patient denies any issues with their feet.   Hyperlipidemia Patient is coming in for recheck of his hyperlipidemia. The patient is currently taking atorvastatin. They deny any issues with myalgias or history of liver damage from it. They deny any focal numbness or weakness or chest pain.   GERD Patient is currently on omeprazole.  She denies any major symptoms or abdominal pain or belching or burping. She denies any blood in her stool or lightheadedness or dizziness.   Hypertension Patient is currently on losartan-hydrochlorothiazide and hydralazine, and their blood pressure today is 116/71. Patient denies any lightheadedness or dizziness. Patient denies headaches, blurred vision, chest pains, shortness of breath, or weakness. Denies any side effects from medication and is content with current medication.   Relevant past medical, surgical, family and social history reviewed and updated as indicated. Interim medical history since our last visit reviewed. Allergies and medications reviewed and updated.  Review of Systems  Constitutional: Negative for chills and fever.  Eyes: Negative for visual disturbance.  Respiratory: Negative for  chest tightness and shortness of breath.   Cardiovascular: Negative for chest pain and leg swelling.  Musculoskeletal: Negative for back pain and gait problem.  Skin: Negative for rash.  Neurological: Negative for dizziness, light-headedness and headaches.  Psychiatric/Behavioral: Negative for agitation and behavioral problems.  All other systems reviewed and are negative.   Per HPI unless specifically indicated above   Allergies as of 04/16/2017   No Known Allergies     Medication List        Accurate as of 04/16/17 10:08 AM. Always use your most recent med list.          acetaminophen 500 MG tablet Commonly known as:  TYLENOL Take 1,000 mg by mouth every 6 (six) hours as needed.   ASPIRIN ADULT LOW STRENGTH 81 MG EC tablet Generic drug:  aspirin TAKE 1 TABLET EVERY DAY   atorvastatin 20 MG tablet Commonly known as:  LIPITOR TAKE 1 TABLET (20 MG TOTAL) BY MOUTH DAILY.   B-D SINGLE USE SWABS REGULAR Pads TEST TWO TIMES DAILY   ergocalciferol 50000 units capsule Commonly known as:  VITAMIN D2 Take 50,000 Units by mouth once a week.   glimepiride 4 MG tablet Commonly known as:  AMARYL TAKE 1 TABLET EVERY MORNING BEFORE A MEAL   glucose blood test strip Commonly known as:  PRODIGY NO CODING BLOOD GLUC TEST  BLOOD  SUGAR TWICE DAILY   TRUE METRIX BLOOD GLUCOSE TEST test strip Generic drug:  glucose blood CHECK BLOOD SUGAR TWICE DAILY  AND AS NEEDED  hydrALAZINE 10 MG tablet Commonly known as:  APRESOLINE Take 1 tablet (10 mg total) by mouth 2 (two) times daily.   losartan-hydrochlorothiazide 50-12.5 MG tablet Commonly known as:  HYZAAR TAKE 1 TABLET EVERY DAY   metoprolol succinate 100 MG 24 hr tablet Commonly known as:  TOPROL-XL TAKE 1 TABLET EVERY DAY  WITH  OR  IMMEDIATELY  FOLLOWING A MEAL   nitroGLYCERIN 0.4 MG SL tablet Commonly known as:  NITROSTAT PLACE 1 TABLET (0.4 MG TOTAL) UNDER THE TONGUE EVERY 5 (FIVE) MINUTES AS NEEDED. FOR CHEST PAIN     omeprazole 40 MG capsule Commonly known as:  PRILOSEC TAKE 1 CAPSULE EVERY DAY   ORTHOGEL Gel Apply 1 application topically daily as needed (pain).   polyethylene glycol powder powder Commonly known as:  GLYCOLAX/MIRALAX Take 17 g by mouth 2 (two) times daily as needed.   PRODIGY TWIST TOP LANCETS 28G Misc TEST TWICE DAILY   TRUEPLUS LANCETS 33G Misc TEST BLOOD SUGAR TWICE DAILY  AND AS NEEDED   SitaGLIPtin-MetFORMIN HCl 50-500 MG Tb24 Commonly known as:  JANUMET XR Take 50-500 mg by mouth 2 (two) times daily.   TRUE METRIX LEVEL 2 Normal Soln Use as needed with BS machine          Objective:    BP 116/71   Pulse 68   Temp (!) 96.9 F (36.1 C) (Oral)   Ht 5' 2"  (1.575 m)   Wt 161 lb (73 kg)   BMI 29.45 kg/m   Wt Readings from Last 3 Encounters:  04/16/17 161 lb (73 kg)  01/14/17 161 lb (73 kg)  01/13/17 161 lb (73 kg)    Physical Exam  Constitutional: She is oriented to person, place, and time. She appears well-developed and well-nourished. No distress.  Eyes: Conjunctivae are normal.  Neck: Neck supple. No thyromegaly present.  Cardiovascular: Normal rate, regular rhythm, normal heart sounds and intact distal pulses.  No murmur heard. Pulmonary/Chest: Effort normal and breath sounds normal. No respiratory distress. She has no wheezes. She has no rales.  Musculoskeletal: Normal range of motion. She exhibits no edema.  Lymphadenopathy:    She has no cervical adenopathy.  Neurological: She is alert and oriented to person, place, and time. Coordination normal.  Skin: Skin is warm and dry. No rash noted. She is not diaphoretic.  Psychiatric: She has a normal mood and affect. Her behavior is normal.  Nursing note and vitals reviewed.       Assessment & Plan:   Problem List Items Addressed This Visit      Cardiovascular and Mediastinum   Hypertension associated with diabetes ([place])   Relevant Orders   Bayer DCA Hb A1c Waived     Digestive   GERD  (gastroesophageal reflux disease)     Endocrine   Type 2 diabetes mellitus ([place]) - Primary   Relevant Orders   CMP14+EGFR   Bayer DCA Hb A1c Waived   Hyperlipidemia associated with type 2 diabetes mellitus ([place])    Continue current medications and continue to monitor for lows, patient continues to have low blood sugars that we may have to stop Amaryl  Follow up plan: Return in about 3 months (around 07/15/2017) for Diabetes and hypertension recheck.  Counseling provided for all of the vaccine components Orders Placed This Encounter  Procedures  . CMP14+EGFR  . [facility] Hb A1c [place], MD [facility] 04/16/2017, 10:08 AM

## 2017-04-17 LAB — CMP14+EGFR
A/G RATIO: 1.3 (ref 1.2–2.2)
ALK PHOS: 65 IU/L (ref 39–117)
ALT: 9 IU/L (ref 0–32)
AST: 11 IU/L (ref 0–40)
Albumin: 4.4 g/dL (ref 3.5–4.8)
BILIRUBIN TOTAL: 0.3 mg/dL (ref 0.0–1.2)
BUN/Creatinine Ratio: 16 (ref 12–28)
BUN: 18 mg/dL (ref 8–27)
CO2: 26 mmol/L (ref 20–29)
Calcium: 10 mg/dL (ref 8.7–10.3)
Chloride: 100 mmol/L (ref 96–106)
Creatinine, Ser: 1.14 mg/dL — ABNORMAL HIGH (ref 0.57–1.00)
GFR calc non Af Amer: 49 mL/min/{1.73_m2} — ABNORMAL LOW (ref >59)
GFR, EST AFRICAN AMERICAN: 56 mL/min/{1.73_m2} — AB (ref >59)
GLUCOSE: 105 mg/dL — AB (ref 65–99)
Globulin, Total: 3.5 g/dL (ref 1.5–4.5)
POTASSIUM: 4.1 mmol/L (ref 3.5–5.2)
Sodium: 140 mmol/L (ref 134–144)
TOTAL PROTEIN: 7.9 g/dL (ref 6.0–8.5)

## 2017-06-10 ENCOUNTER — Other Ambulatory Visit: Payer: Self-pay | Admitting: Family Medicine

## 2017-06-11 ENCOUNTER — Other Ambulatory Visit: Payer: Self-pay | Admitting: *Deleted

## 2017-06-11 MED ORDER — VITAMIN D 1000 UNITS PO TABS: 1000.0000 [IU] | ORAL_TABLET | Freq: Every day | ORAL | 1 refills | Status: DC

## 2017-06-12 ENCOUNTER — Other Ambulatory Visit: Payer: Self-pay | Admitting: *Deleted

## 2017-06-12 MED ORDER — OMEPRAZOLE 40 MG PO CPDR: 40.0000 mg | DELAYED_RELEASE_CAPSULE | Freq: Every day | ORAL | 0 refills | Status: DC

## 2017-07-15 ENCOUNTER — Encounter: Payer: Self-pay | Admitting: Family Medicine

## 2017-07-15 ENCOUNTER — Ambulatory Visit (INDEPENDENT_AMBULATORY_CARE_PROVIDER_SITE_OTHER): Payer: Medicare HMO | Admitting: Family Medicine

## 2017-07-15 VITALS — BP 139/84 | HR 76 | Temp 97.4°F | Ht 62.0 in | Wt 160.0 lb

## 2017-07-15 DIAGNOSIS — E785 Hyperlipidemia, unspecified: Secondary | ICD-10-CM

## 2017-07-15 DIAGNOSIS — N183 Chronic kidney disease, stage 3 unspecified: Secondary | ICD-10-CM

## 2017-07-15 DIAGNOSIS — K2100 Gastro-esophageal reflux disease with esophagitis, without bleeding: Secondary | ICD-10-CM

## 2017-07-15 DIAGNOSIS — I10 Essential (primary) hypertension: Secondary | ICD-10-CM | POA: Diagnosis not present

## 2017-07-15 DIAGNOSIS — E1169 Type 2 diabetes mellitus with other specified complication: Secondary | ICD-10-CM

## 2017-07-15 DIAGNOSIS — E1159 Type 2 diabetes mellitus with other circulatory complications: Secondary | ICD-10-CM

## 2017-07-15 DIAGNOSIS — E1122 Type 2 diabetes mellitus with diabetic chronic kidney disease: Secondary | ICD-10-CM | POA: Diagnosis not present

## 2017-07-15 LAB — CMP14+EGFR
A/G RATIO: 1.3 (ref 1.2–2.2)
ALT: 12 IU/L (ref 0–32)
AST: 16 IU/L (ref 0–40)
Albumin: 4.3 g/dL (ref 3.5–4.8)
Alkaline Phosphatase: 54 IU/L (ref 39–117)
BILIRUBIN TOTAL: 0.4 mg/dL (ref 0.0–1.2)
BUN/Creatinine Ratio: 13 (ref 12–28)
BUN: 15 mg/dL (ref 8–27)
CHLORIDE: 101 mmol/L (ref 96–106)
CO2: 27 mmol/L (ref 20–29)
Calcium: 10.1 mg/dL (ref 8.7–10.3)
Creatinine, Ser: 1.2 mg/dL — ABNORMAL HIGH (ref 0.57–1.00)
GFR calc Af Amer: 53 mL/min/{1.73_m2} — ABNORMAL LOW (ref >59)
GFR calc non Af Amer: 46 mL/min/{1.73_m2} — ABNORMAL LOW (ref >59)
Globulin, Total: 3.2 g/dL (ref 1.5–4.5)
Glucose: 85 mg/dL (ref 65–99)
POTASSIUM: 3.9 mmol/L (ref 3.5–5.2)
Sodium: 142 mmol/L (ref 134–144)
Total Protein: 7.5 g/dL (ref 6.0–8.5)

## 2017-07-15 LAB — BAYER DCA HB A1C WAIVED: HB A1C: 6.1 % (ref <7.0)

## 2017-07-15 MED ORDER — NITROGLYCERIN 0.4 MG SL SUBL: SUBLINGUAL_TABLET | SUBLINGUAL | 0 refills | Status: DC

## 2017-07-15 MED ORDER — SITAGLIP PHOS-METFORMIN HCL ER 50-500 MG PO TB24: 50.0000 mg | ORAL_TABLET | Freq: Two times a day (BID) | ORAL | 1 refills | Status: DC

## 2017-07-15 MED ORDER — OMEPRAZOLE 40 MG PO CPDR: 40.0000 mg | DELAYED_RELEASE_CAPSULE | Freq: Every day | ORAL | 3 refills | Status: DC

## 2017-07-15 MED ORDER — GLIMEPIRIDE 4 MG PO TABS: 4.0000 mg | ORAL_TABLET | Freq: Every day | ORAL | 1 refills | Status: DC

## 2017-07-15 MED ORDER — METOPROLOL SUCCINATE ER 100 MG PO TB24: ORAL_TABLET | ORAL | 1 refills | Status: DC

## 2017-07-15 MED ORDER — LOSARTAN POTASSIUM-HCTZ 50-12.5 MG PO TABS: 1.0000 | ORAL_TABLET | Freq: Every day | ORAL | 1 refills | Status: DC

## 2017-07-15 MED ORDER — HYDRALAZINE HCL 10 MG PO TABS: 10.0000 mg | ORAL_TABLET | Freq: Two times a day (BID) | ORAL | 1 refills | Status: DC

## 2017-07-15 MED ORDER — ATORVASTATIN CALCIUM 20 MG PO TABS: 20.0000 mg | ORAL_TABLET | Freq: Every day | ORAL | 1 refills | Status: DC

## 2017-07-15 NOTE — Patient Instructions (Signed)
May reduce Janumet to just Januvia and get rid of the metformin if patient continues to have abdominal issues and if A1c is running well.

## 2017-07-15 NOTE — Progress Notes (Signed)
BP 139/84   Pulse 76   Temp (!) 97.4 F (36.3 C) (Oral)   Ht 5' 2"  (1.575 m)   Wt 160 lb (72.6 kg)   BMI 29.26 kg/m    Subjective:    Patient ID: Tonya Carpenter, female    DOB: Sep 12, 1945, 72 y.o.   MRN: [id number]  HPI: Tonya Carpenter is a 72 y.o. female presenting on 07/15/2017 for Hyperlipidemia; Hypertension; and Diabetes   HPI Hypertension Patient is currently on metoprolol and hydralazine and losartan-hydrochlorothiazide, and their blood pressure today is 139/84. Patient denies any lightheadedness or dizziness. Patient denies headaches, blurred vision, chest pains, shortness of breath, or weakness. Denies any side effects from medication and is content with current medication.   Type 2 diabetes mellitus Patient comes in today for recheck of his diabetes. Patient has been currently taking Janumet and Amaryl. Patient is currently on an ACE inhibitor/ARB. Patient has not seen an ophthalmologist this year. Patient denies any issues with their feet.   Hyperlipidemia Patient is coming in for recheck of his hyperlipidemia. The patient is currently taking Lipitor. They deny any issues with myalgias or history of liver damage from it. They deny any focal numbness or weakness or chest pain.   GERD Patient is currently on no medication.  She denies any major symptoms or abdominal pain or belching or burping. She denies any blood in her stool or lightheadedness or dizziness.   Relevant past medical, surgical, family and social history reviewed and updated as indicated. Interim medical history since our last visit reviewed. Allergies and medications reviewed and updated.  Review of Systems  Constitutional: Negative for chills and fever.  Eyes: Negative for visual disturbance.  Respiratory: Negative for chest tightness and shortness of breath.   Cardiovascular: Negative for chest pain and leg swelling.  Genitourinary: Negative for difficulty urinating and dysuria.  Musculoskeletal: Negative  for back pain and gait problem.  Skin: Negative for rash.  Neurological: Negative for light-headedness and headaches.  Psychiatric/Behavioral: Negative for agitation and behavioral problems.  All other systems reviewed and are negative.   Per HPI unless specifically indicated above   Allergies as of 07/15/2017   No Known Allergies     Medication List        Accurate as of 07/15/17 11:22 AM. Always use your most recent med list.          acetaminophen 500 MG tablet Commonly known as:  TYLENOL Take 1,000 mg by mouth every 6 (six) hours as needed.   ASPIRIN ADULT LOW STRENGTH 81 MG EC tablet Generic drug:  aspirin TAKE 1 TABLET EVERY DAY   atorvastatin 20 MG tablet Commonly known as:  LIPITOR Take 1 tablet (20 mg total) by mouth daily.   B-D SINGLE USE SWABS REGULAR Pads TEST TWO TIMES DAILY   cholecalciferol 1000 units tablet Commonly known as:  VITAMIN D Take 1,000 Units by mouth daily.   glimepiride 4 MG tablet Commonly known as:  AMARYL Take 1 tablet (4 mg total) by mouth daily with breakfast.   glucose blood test strip Commonly known as:  PRODIGY NO CODING BLOOD GLUC TEST  BLOOD  SUGAR TWICE DAILY   TRUE METRIX BLOOD GLUCOSE TEST test strip Generic drug:  glucose blood CHECK BLOOD SUGAR TWICE DAILY  AND AS NEEDED   hydrALAZINE 10 MG tablet Commonly known as:  APRESOLINE Take 1 tablet (10 mg total) by mouth 2 (two) times daily.   losartan-hydrochlorothiazide 50-12.5 MG tablet Commonly known as:  HYZAAR Take 1 tablet by mouth daily.   metoprolol succinate 100 MG 24 hr tablet Commonly known as:  TOPROL-XL TAKE 1 TABLET EVERY DAY  WITH  OR  IMMEDIATELY  FOLLOWING A MEAL   nitroGLYCERIN 0.4 MG SL tablet Commonly known as:  NITROSTAT PLACE 1 TABLET (0.4 MG TOTAL) UNDER THE TONGUE EVERY 5 (FIVE) MINUTES AS NEEDED. FOR CHEST PAIN   omeprazole 40 MG capsule Commonly known as:  PRILOSEC Take 1 capsule (40 mg total) by mouth daily.   ORTHOGEL Gel Apply 1  application topically daily as needed (pain).   polyethylene glycol powder powder Commonly known as:  GLYCOLAX/MIRALAX Take 17 g by mouth 2 (two) times daily as needed.   PRODIGY TWIST TOP LANCETS 28G Misc TEST TWICE DAILY   TRUEPLUS LANCETS 33G Misc TEST BLOOD SUGAR TWICE DAILY  AND AS NEEDED   SitaGLIPtin-MetFORMIN HCl 50-500 MG Tb24 Commonly known as:  JANUMET XR Take 50-500 mg by mouth 2 (two) times daily.   TRUE METRIX LEVEL 2 Normal Soln Use as needed with BS machine          Objective:    BP 139/84   Pulse 76   Temp (!) 97.4 F (36.3 C) (Oral)   Ht 5' 2"  (1.575 m)   Wt 160 lb (72.6 kg)   BMI 29.26 kg/m   Wt Readings from Last 3 Encounters:  07/15/17 160 lb (72.6 kg)  04/16/17 161 lb (73 kg)  01/14/17 161 lb (73 kg)    Physical Exam  Constitutional: She is oriented to person, place, and time. She appears well-developed and well-nourished. No distress.  Eyes: Conjunctivae are normal.  Cardiovascular: Normal rate, regular rhythm, normal heart sounds and intact distal pulses.  No murmur heard. Pulmonary/Chest: Effort normal and breath sounds normal. No respiratory distress. She has no wheezes.  Musculoskeletal: Normal range of motion. She exhibits no edema or tenderness.  Neurological: She is alert and oriented to person, place, and time. Coordination normal.  Skin: Skin is warm and dry. No rash noted. She is not diaphoretic.  Psychiatric: She has a normal mood and affect. Her behavior is normal.  Nursing note and vitals reviewed.       Assessment & Plan:   Problem List Items Addressed This Visit      Cardiovascular and Mediastinum   Hypertension associated with diabetes ([place]) - Primary   Relevant Medications   nitroGLYCERIN (NITROSTAT) 0.4 MG SL tablet   metoprolol succinate (TOPROL-XL) 100 MG 24 hr tablet   losartan-hydrochlorothiazide (HYZAAR) 50-12.5 MG tablet   hydrALAZINE (APRESOLINE) 10 MG tablet   glimepiride (AMARYL) 4 MG tablet    atorvastatin (LIPITOR) 20 MG tablet   Other Relevant Orders   CMP14+EGFR (Completed)     Digestive   GERD (gastroesophageal reflux disease)   Relevant Medications   omeprazole (PRILOSEC) 40 MG capsule     Endocrine   Type 2 diabetes mellitus ([facility])   Relevant Medications   losartan-hydrochlorothiazide (HYZAAR) 50-12.5 MG tablet   glimepiride (AMARYL) 4 MG tablet   atorvastatin (LIPITOR) 20 MG tablet   Other Relevant Orders   Bayer DCA Hb A1c Waived (Completed)   CMP14+EGFR (Completed)   Hyperlipidemia associated with type 2 diabetes mellitus ([facility])   Relevant Medications   nitroGLYCERIN (NITROSTAT) 0.4 MG SL tablet   metoprolol succinate (TOPROL-XL) 100 MG 24 hr tablet   losartan-hydrochlorothiazide (HYZAAR) 50-12.5 MG tablet   hydrALAZINE (APRESOLINE) 10 MG tablet   glimepiride (AMARYL) 4 MG tablet   atorvastatin (LIPITOR)  20 MG tablet     May reduce Janumet to just Januvia and get rid of the metformin if patient continues to have abdominal issues and if A1c is running well.  Otherwise continue medications for now.  We will check labs today  Follow up plan: Return in about 3 months (around 10/14/2017), or if symptoms worsen or fail to improve, for Hypertension and diabetes recheck.  Counseling provided for all of the vaccine components Orders Placed This Encounter  Procedures  . Bayer DCA Hb A1c Waived  . Easton Child Campoy, MD [facility] 07/15/2017, 11:23 AM

## 2017-07-16 ENCOUNTER — Other Ambulatory Visit: Payer: Self-pay

## 2017-07-16 DIAGNOSIS — N183 Chronic kidney disease, stage 3 unspecified: Secondary | ICD-10-CM

## 2017-07-16 DIAGNOSIS — E1122 Type 2 diabetes mellitus with diabetic chronic kidney disease: Secondary | ICD-10-CM

## 2017-07-30 ENCOUNTER — Encounter: Payer: Self-pay | Admitting: *Deleted

## 2017-07-30 ENCOUNTER — Other Ambulatory Visit: Payer: Self-pay | Admitting: Family Medicine

## 2017-07-30 DIAGNOSIS — H9313 Tinnitus, bilateral: Secondary | ICD-10-CM

## 2017-07-30 NOTE — Progress Notes (Signed)
Referral has been started and patient aware, per letter mailed to her.

## 2017-07-30 NOTE — Progress Notes (Unsigned)
Will do referral to ENT for tinnitus

## 2017-09-02 ENCOUNTER — Other Ambulatory Visit: Payer: Self-pay | Admitting: Family Medicine

## 2017-10-15 ENCOUNTER — Ambulatory Visit: Payer: Medicare HMO | Admitting: Family Medicine

## 2017-10-15 ENCOUNTER — Encounter: Payer: Self-pay | Admitting: Family Medicine

## 2017-10-15 ENCOUNTER — Ambulatory Visit (INDEPENDENT_AMBULATORY_CARE_PROVIDER_SITE_OTHER): Payer: Medicare HMO | Admitting: Family Medicine

## 2017-10-15 VITALS — BP 138/75 | HR 58 | Temp 97.2°F | Ht 62.0 in | Wt 159.6 lb

## 2017-10-15 DIAGNOSIS — E785 Hyperlipidemia, unspecified: Secondary | ICD-10-CM | POA: Diagnosis not present

## 2017-10-15 DIAGNOSIS — N183 Chronic kidney disease, stage 3 (moderate): Secondary | ICD-10-CM

## 2017-10-15 DIAGNOSIS — K2100 Gastro-esophageal reflux disease with esophagitis, without bleeding: Secondary | ICD-10-CM

## 2017-10-15 DIAGNOSIS — I10 Essential (primary) hypertension: Secondary | ICD-10-CM | POA: Diagnosis not present

## 2017-10-15 DIAGNOSIS — E1122 Type 2 diabetes mellitus with diabetic chronic kidney disease: Secondary | ICD-10-CM | POA: Diagnosis not present

## 2017-10-15 DIAGNOSIS — E1169 Type 2 diabetes mellitus with other specified complication: Secondary | ICD-10-CM

## 2017-10-15 DIAGNOSIS — E1159 Type 2 diabetes mellitus with other circulatory complications: Secondary | ICD-10-CM | POA: Diagnosis not present

## 2017-10-15 DIAGNOSIS — I152 Hypertension secondary to endocrine disorders: Secondary | ICD-10-CM

## 2017-10-15 LAB — LIPID PANEL
CHOLESTEROL TOTAL: 132 mg/dL (ref 100–199)
Chol/HDL Ratio: 2.6 ratio (ref 0.0–4.4)
HDL: 50 mg/dL (ref >39)
LDL Calculated: 58 mg/dL (ref 0–99)
TRIGLYCERIDES: 121 mg/dL (ref 0–149)
VLDL CHOLESTEROL CAL: 24 mg/dL (ref 5–40)

## 2017-10-15 LAB — CMP14+EGFR
A/G RATIO: 1.2 (ref 1.2–2.2)
ALK PHOS: 56 IU/L (ref 39–117)
ALT: 10 IU/L (ref 0–32)
AST: 13 IU/L (ref 0–40)
Albumin: 4.1 g/dL (ref 3.5–4.8)
BILIRUBIN TOTAL: 0.4 mg/dL (ref 0.0–1.2)
BUN/Creatinine Ratio: 10 — ABNORMAL LOW (ref 12–28)
BUN: 12 mg/dL (ref 8–27)
CHLORIDE: 101 mmol/L (ref 96–106)
CO2: 27 mmol/L (ref 20–29)
Calcium: 9.8 mg/dL (ref 8.7–10.3)
Creatinine, Ser: 1.24 mg/dL — ABNORMAL HIGH (ref 0.57–1.00)
GFR calc non Af Amer: 44 mL/min/{1.73_m2} — ABNORMAL LOW (ref >59)
GFR, EST AFRICAN AMERICAN: 51 mL/min/{1.73_m2} — AB (ref >59)
Globulin, Total: 3.3 g/dL (ref 1.5–4.5)
Glucose: 99 mg/dL (ref 65–99)
POTASSIUM: 3.5 mmol/L (ref 3.5–5.2)
Sodium: 142 mmol/L (ref 134–144)
Total Protein: 7.4 g/dL (ref 6.0–8.5)

## 2017-10-15 LAB — BAYER DCA HB A1C WAIVED: HB A1C: 6.1 % (ref <7.0)

## 2017-10-15 NOTE — Progress Notes (Signed)
BP 138/75   Pulse (!) 58   Temp (!) 97.2 F (36.2 C) (Oral)   Ht _0  (1.575 m)   Wt 159 lb 9.6 oz (72.4 kg)   BMI 29.19 kg/m    Subjective:    Patient ID: Tonya Carpenter, female    DOB: [birth year], 72 y.o.   MRN: [id number]  HPI: Tonya Carpenter is a 72 y.o. female presenting on 10/15/2017 for Diabetes (3 month follow up) and Hypertension   HPI Hypertension Patient is currently on hydralazine and losartan-hydrochlorothiazide and metoprolol, and their blood pressure today is 138/75. Patient denies any lightheadedness or dizziness. Patient denies headaches, blurred vision, chest pains, shortness of breath, or weakness. Denies any side effects from medication and is content with current medication.   Type 2 diabetes mellitus Patient comes in today for recheck of his diabetes. Patient has been currently taking Janumet. Patient is currently on an ACE inhibitor/ARB. Patient has not seen an ophthalmologist this year. Patient denies any issues with their feet.  Patient does have stage III renal disease and we are monitoring with labs.  Patient is already on ACE inhibitor for renal protection and recommend hydration as well as she can.  Hyperlipidemia Patient is coming in for recheck of his hyperlipidemia. The patient is currently taking Lipitor. They deny any issues with myalgias or history of liver damage from it. They deny any focal numbness or weakness or chest pain.   GERD Patient is currently on omeprazole.  She denies any major symptoms or abdominal pain or belching or burping. She denies any blood in her stool or lightheadedness or dizziness.   Relevant past medical, surgical, family and social history reviewed and updated as indicated. Interim medical history since our last visit reviewed. Allergies and medications reviewed and updated.  Review of Systems  Constitutional: Negative for chills and fever.  Eyes: Negative for visual disturbance.  Respiratory: Negative for chest tightness  and shortness of breath.   Cardiovascular: Negative for chest pain and leg swelling.  Genitourinary: Negative for difficulty urinating.  Musculoskeletal: Negative for back pain and gait problem.  Skin: Negative for rash.  Neurological: Negative for dizziness, weakness, light-headedness, numbness and headaches.  Psychiatric/Behavioral: Negative for agitation and behavioral problems.  All other systems reviewed and are negative.   Per HPI unless specifically indicated above   Allergies as of 10/15/2017   No Known Allergies     Medication List        Accurate as of 10/15/17  8:57 AM. Always use your most recent med list.          acetaminophen 500 MG tablet Commonly known as:  TYLENOL Take 1,000 mg by mouth every 6 (six) hours as needed.   ASPIRIN ADULT LOW STRENGTH 81 MG EC tablet Generic drug:  aspirin TAKE 1 TABLET EVERY DAY   atorvastatin 20 MG tablet Commonly known as:  LIPITOR Take 1 tablet (20 mg total) by mouth daily.   B-D SINGLE USE SWABS REGULAR Pads TEST TWO TIMES DAILY   cholecalciferol 1000 units tablet Commonly known as:  VITAMIN D Take 1,000 Units by mouth daily.   glucose blood test strip Commonly known as:  PRODIGY NO CODING BLOOD GLUC TEST  BLOOD  SUGAR TWICE DAILY   TRUE METRIX BLOOD GLUCOSE TEST test strip Generic drug:  glucose blood TEST BLOOD SUGAR TWICE DAILY  AND AS NEEDED   hydrALAZINE 10 MG tablet Commonly known as:  APRESOLINE Take 1 tablet (10 mg total) by  mouth 2 (two) times daily.   JANUMET XR 50-500 MG Tb24 Generic drug:  SitaGLIPtin-MetFORMIN HCl Take 1 tablet by mouth 2 (two) times daily.   losartan-hydrochlorothiazide 50-12.5 MG tablet Commonly known as:  HYZAAR Take 1 tablet by mouth daily.   metoprolol succinate 100 MG 24 hr tablet Commonly known as:  TOPROL-XL TAKE 1 TABLET EVERY DAY  WITH  OR  IMMEDIATELY  FOLLOWING A MEAL   nitroGLYCERIN 0.4 MG SL tablet Commonly known as:  NITROSTAT PLACE 1 TABLET (0.4 MG  TOTAL) UNDER THE TONGUE EVERY 5 (FIVE) MINUTES AS NEEDED. FOR CHEST PAIN   omeprazole 40 MG capsule Commonly known as:  PRILOSEC Take 1 capsule (40 mg total) by mouth daily.   ORTHOGEL Gel Apply 1 application topically daily as needed (pain).   polyethylene glycol powder powder Commonly known as:  GLYCOLAX/MIRALAX Take 17 g by mouth 2 (two) times daily as needed.   PRODIGY TWIST TOP LANCETS 28G Misc TEST TWICE DAILY   TRUEPLUS LANCETS 33G Misc TEST BLOOD SUGAR TWICE DAILY  AND AS NEEDED   TRUE METRIX LEVEL 2 Normal Soln Use as needed with BS machine          Objective:    BP 138/75   Pulse (!) 58   Temp (!) 97.2 F (36.2 C) (Oral)   Ht _0  (1.575 m)   Wt 159 lb 9.6 oz (72.4 kg)   BMI 29.19 kg/m   Wt Readings from Last 3 Encounters:  10/15/17 159 lb 9.6 oz (72.4 kg)  07/15/17 160 lb (72.6 kg)  04/16/17 161 lb (73 kg)    Physical Exam  Constitutional: She is oriented to person, place, and time. She appears well-developed and well-nourished. No distress.  Eyes: Conjunctivae are normal.  Neck: Neck supple. No thyromegaly present.  Cardiovascular: Normal rate, regular rhythm, normal heart sounds and intact distal pulses.  No murmur heard. Pulmonary/Chest: Effort normal and breath sounds normal. No respiratory distress. She has no wheezes.  Musculoskeletal: Normal range of motion. She exhibits no edema.  Lymphadenopathy:    She has no cervical adenopathy.  Neurological: She is alert and oriented to person, place, and time. Coordination normal.  Skin: Skin is warm and dry. No rash noted. She is not diaphoretic.  Psychiatric: She has a normal mood and affect. Her behavior is normal.  Nursing note and vitals reviewed.     Assessment & Plan:   Problem List Items Addressed This Visit      Cardiovascular and Mediastinum   Hypertension associated with diabetes ([place])   Relevant Medications   JANUMET XR 50-500 MG TB24     Digestive   GERD (gastroesophageal reflux  disease)     Endocrine   Type 2 diabetes mellitus ([place]) - Primary   Relevant Medications   JANUMET XR 50-500 MG TB24   Other Relevant Orders   Bayer DCA Hb A1c Waived (Completed)   CMP14+EGFR (Completed)   Hyperlipidemia associated with type 2 diabetes mellitus (HCC)   Relevant Medications   JANUMET XR 50-500 MG TB24   Other Relevant Orders   Lipid panel (Completed)       Follow up plan: Return in about 3 months (around 01/15/2018), or if symptoms worsen or fail to improve, for Diabetes and hypertension recheck.  Counseling provided for all of the vaccine components Orders Placed This Encounter  Procedures  . Bayer DCA Hb A1c Waived  . CMP14+EGFR  . Lipid panel    Caryl Pina, MD [facility] 10/15/2017, 8:57 AM

## 2017-11-21 ENCOUNTER — Other Ambulatory Visit: Payer: Self-pay | Admitting: Family Medicine

## 2017-11-28 ENCOUNTER — Telehealth: Payer: Self-pay

## 2017-12-02 ENCOUNTER — Encounter: Payer: Self-pay | Admitting: Family Medicine

## 2017-12-02 ENCOUNTER — Ambulatory Visit (INDEPENDENT_AMBULATORY_CARE_PROVIDER_SITE_OTHER): Payer: Medicare HMO | Admitting: Family Medicine

## 2017-12-02 ENCOUNTER — Other Ambulatory Visit: Payer: Self-pay | Admitting: Family Medicine

## 2017-12-02 VITALS — BP 115/68 | HR 72 | Temp 98.0°F | Ht 62.0 in | Wt 151.0 lb

## 2017-12-02 DIAGNOSIS — M7541 Impingement syndrome of right shoulder: Secondary | ICD-10-CM | POA: Diagnosis not present

## 2017-12-02 NOTE — Patient Instructions (Signed)
Shoulder Impingement Syndrome Shoulder impingement syndrome is a condition that causes pain when connective tissues (tendons) surrounding the shoulder joint become pinched. These tendons are part of the group of muscles and tissues that help to stabilize the shoulder (rotator cuff). Beneath the rotator cuff is a fluid-filled sac (bursa) that allows the muscles and tendons to glide smoothly. The bursa may become swollen or irritated (bursitis). Bursitis, swelling in the rotator cuff tendons, or both conditions can decrease how much space is under a bone in the shoulder joint (acromion), resulting in impingement. What are the causes? Shoulder impingement syndrome can be caused by bursitis or swelling of the rotator cuff tendons, which may result from:  Repetitive overhead arm movements.  Falling onto the shoulder.  Weakness in the shoulder muscles.  What increases the risk? You may be more likely to develop this condition if you are an athlete who participates in:  Sports that involve throwing, such as baseball.  Tennis.  Swimming.  Volleyball.  Some people are also more likely to develop impingement syndrome because of the shape of their acromion bone. What are the signs or symptoms? The main symptom of this condition is pain on the front or side of the shoulder. Pain may:  Get worse when lifting or raising the arm.  Get worse at night.  Wake you up from sleeping.  Feel sharp when the shoulder is moved, and then fade to an ache.  Other signs and symptoms may include:  Tenderness.  Stiffness.  Inability to raise the arm above shoulder level or behind the body.  Weakness.  How is this diagnosed? This condition may be diagnosed based on:  Your symptoms.  Your medical history.  A physical exam.  Imaging tests, such as: ? X-rays. ? MRI. ? Ultrasound.  How is this treated? Treatment for this condition may include:  Resting your shoulder and avoiding all  activities that cause pain or put stress on the shoulder.  Icing your shoulder.  NSAIDs to help reduce pain and swelling.  One or more injections of medicines to numb the area and reduce inflammation.  Physical therapy.  Surgery. This may be needed if nonsurgical treatments have not helped. Surgery may involve repairing the rotator cuff, reshaping the acromion, or removing the bursa.  Follow these instructions at home: Managing pain, stiffness, and swelling  If directed, apply ice to the injured area. ? Put ice in a plastic bag. ? Place a towel between your skin and the bag. ? Leave the ice on for 20 minutes, 2-3 times a day. Activity  Rest and return to your normal activities as told by your health care provider. Ask your health care provider what activities are safe for you.  Do exercises as told by your health care provider. General instructions  Do not use any tobacco products, including cigarettes, chewing tobacco, or e-cigarettes. Tobacco can delay healing. If you need help quitting, ask your health care provider.  Ask your health care provider when it is safe for you to drive.  Take over-the-counter and prescription medicines only as told by your health care provider.  Keep all follow-up visits as told by your health care provider. This is important. How is this prevented?  Give your body time to rest between periods of activity.  Be safe and responsible while being active to avoid falls.  Maintain physical fitness, including strength and flexibility. Contact a health care provider if:  Your symptoms have not improved after 1-2 months of treatment and   rest.  You cannot lift your arm away from your body. This information is not intended to replace advice given to you by your health care provider. Make sure you discuss any questions you have with your health care provider. Document Released: 03/04/2005 Document Revised: 11/09/2015 Document Reviewed:  02/04/2015 Elsevier Interactive Patient Education  2018 Elsevier Inc.  

## 2017-12-02 NOTE — Progress Notes (Addendum)
Subjective:    Patient ID: Tonya Carpenter, female    DOB: [birth year], 72 y.o.   MRN: [id number]  Chief Complaint:  Right shoulder pain   HPI: Tonya Carpenter is a 72 y.o. female presenting on 12/02/2017 for Right shoulder pain   1. Shoulder impingement syndrome, right    Pt presents today with 2-3 weeks of right shoulder pain. Denies injury or overuse. States the pain is aching and constant, 4/10. States the pain is worse with lifting objects and raising her arm. She has used heat, topical creams, and tylenol with complete relief of the pain. Pt states she is unable to take NSAIDS due to her diabetes and kidneys.   Relevant past medical, surgical, family and social history reviewed and updated as indicated. Interim medical history since our last visit reviewed. Allergies and medications reviewed and updated. DATA REVIEWED: CHART IN EPIC  Family History reviewed for pertinent findings.  Past Medical History:  Diagnosis Date  . Anxiety   . Arthritis    "hands" (01/14/2013)  . Asthma   . Cataract   . Coronary artery disease   . Exertional shortness of breath   . Hyperlipidemia   . Hypertension   . Myocardial infarction [facility])    "several years back" (01/14/2013)  . Stroke [facility])    "light stroke long years ago; didn't affect me permanently" (01/14/2013)  . Type 2 diabetes mellitus ([place])     Past Surgical History:  Procedure Laterality Date  . APPENDECTOMY    . CARDIAC CATHETERIZATION  2008   no angiographically significant CAD  . CARDIAC CATHETERIZATION  01/14/2013  . CARDIOVASCULAR STRESS TEST  12/2009   no evidence for stress-induced reversibility or ischemia.  She had a fixed anterior septal wall defect, possibly related to breast attenuation and her EF was 54%.  Marland Kitchen CATARACT EXTRACTION W/PHACO Right 10/25/2014   Procedure: CATARACT EXTRACTION PHACO AND INTRAOCULAR LENS PLACEMENT; CDE:  17.28;  Surgeon: Rutherford Guys, MD;  Location: [facility];  Service: Ophthalmology;  Laterality:  Right;  . CATARACT EXTRACTION W/PHACO Left 11/08/2014   Procedure: CATARACT EXTRACTION PHACO AND INTRAOCULAR LENS PLACEMENT (IOC);  Surgeon: Rutherford Guys, MD;  Location: [facility];  Service: Ophthalmology;  Laterality: Left;  CDE: 5.94  . CHOLECYSTECTOMY    . DILATION AND CURETTAGE OF UTERUS    . LEFT HEART CATHETERIZATION WITH CORONARY ANGIOGRAM N/A 01/14/2013   Procedure: LEFT HEART CATHETERIZATION WITH CORONARY ANGIOGRAM;  Surgeon: Burnell Blanks, MD;  Location: [facility] CATH LAB;  Service: Cardiovascular;  Laterality: N/A;  . TONSILLECTOMY    . TUBAL LIGATION    . VAGINAL HYSTERECTOMY      Social History   Socioeconomic History  . Marital status: Married    Spouse name: Not on file  . Number of children: 2  . Years of education: Not on file  . Highest education level: Not on file  Occupational History  . Occupation: Disability    Employer: UNEMPLOYED  Social Needs  . Financial resource strain: Not on file  . Food insecurity:    Worry: Not on file    Inability: Not on file  . Transportation needs:    Medical: Not on file    Non-medical: Not on file  Tobacco Use  . Smoking status: Never Smoker  . Smokeless tobacco: Never Used  Substance and Sexual Activity  . Alcohol use: No  . Drug use: No  . Sexual activity: Never    Birth control/protection: Surgical  Lifestyle  .  Physical activity:    Days per week: Not on file    Minutes per session: Not on file  . Stress: Not on file  Relationships  . Social connections:    Talks on phone: Not on file    Gets together: Not on file    Attends religious service: Not on file    Active member of club or organization: Not on file    Attends meetings of clubs or organizations: Not on file    Relationship status: Not on file  . Intimate partner violence:    Fear of current or ex partner: Not on file    Emotionally abused: Not on file    Physically abused: Not on file    Forced sexual activity: Not on file  Other Topics Concern    . Not on file  Social History Narrative   Lives in [place], [place].     Outpatient Encounter Medications as of 12/02/2017  Medication Sig  . acetaminophen (TYLENOL) 500 MG tablet Take 1,000 mg by mouth every 6 (six) hours as needed.  . Alcohol Swabs (B-D SINGLE USE SWABS REGULAR) PADS TEST TWO TIMES DAILY  . ASPIRIN ADULT LOW STRENGTH 81 MG EC tablet TAKE 1 TABLET EVERY DAY  . atorvastatin (LIPITOR) 20 MG tablet Take 1 tablet (20 mg total) by mouth daily.  . Blood Glucose Calibration (TRUE METRIX LEVEL 2) Normal SOLN Use as needed with BS machine  . cholecalciferol (VITAMIN D) 1000 units tablet Take 1,000 Units by mouth daily.  Marland Kitchen glucose blood (PRODIGY NO CODING BLOOD GLUC) test strip USE  TO TEST BLOOD SUGAR TWICE DAILY  . hydrALAZINE (APRESOLINE) 10 MG tablet Take 1 tablet (10 mg total) by mouth 2 (two) times daily.  Marland Kitchen JANUMET XR 50-500 MG TB24 Take 1 tablet by mouth 2 (two) times daily.  . Liniments (ORTHOGEL) GEL Apply 1 application topically daily as needed (pain).   Marland Kitchen losartan-hydrochlorothiazide (HYZAAR) 50-12.5 MG tablet Take 1 tablet by mouth daily.  . metoprolol succinate (TOPROL-XL) 100 MG 24 hr tablet TAKE 1 TABLET EVERY DAY  WITH  OR  IMMEDIATELY  FOLLOWING A MEAL  . nitroGLYCERIN (NITROSTAT) 0.4 MG SL tablet PLACE 1 TABLET (0.4 MG TOTAL) UNDER THE TONGUE EVERY 5 (FIVE) MINUTES AS NEEDED. FOR CHEST PAIN  . omeprazole (PRILOSEC) 40 MG capsule Take 1 capsule (40 mg total) by mouth daily.  . polyethylene glycol powder (GLYCOLAX/MIRALAX) powder Take 17 g by mouth 2 (two) times daily as needed.  Marland Kitchen PRODIGY TWIST TOP LANCETS 28G MISC TEST TWICE DAILY  . TRUEPLUS LANCETS 33G MISC TEST BLOOD SUGAR TWICE DAILY  AND AS NEEDED   No facility-administered encounter medications on file as of 12/02/2017.     No Known Allergies  Review of Systems  Constitutional: Negative for chills, fatigue and fever.  Respiratory: Negative for chest tightness and shortness of breath.   Cardiovascular:  Negative for chest pain and palpitations.  Musculoskeletal: Positive for arthralgias and myalgias. Negative for joint swelling.  Skin: Negative for rash and wound.  Neurological: Negative for weakness and numbness.  All other systems reviewed and are negative.       Objective:    BP 115/68   Pulse 72   Temp 98 F (36.7 C) (Oral)   Ht 5' 2"  (1.575 m)   Wt 151 lb (68.5 kg)   BMI 27.62 kg/m    Wt Readings from Last 3 Encounters:  12/02/17 151 lb (68.5 kg)  10/15/17 159 lb 9.6 oz (72.4  kg)  07/15/17 160 lb (72.6 kg)    Physical Exam  Constitutional: She is oriented to person, place, and time. She appears well-developed and well-nourished.  Neck: Normal range of motion. Neck supple.  Cardiovascular: Normal rate, regular rhythm, normal heart sounds and intact distal pulses.  Pulmonary/Chest: Effort normal and breath sounds normal.  Musculoskeletal:       Right shoulder: She exhibits tenderness, pain and decreased strength. She exhibits no swelling, no effusion, no crepitus, no deformity, no laceration, no spasm and normal pulse.  Left shoulder: Pain with abduction, adduction. Negative drop arm sign. Positive Neer and Hawkins signs. Sensation, distal pulses, and cap refill WNL.   Neurological: She is alert and oriented to person, place, and time.  Skin: Skin is warm and dry. Capillary refill takes less than 2 seconds.  Psychiatric: She has a normal mood and affect. Her behavior is normal. Judgment and thought content normal.  Nursing note and vitals reviewed.   Results for orders placed or performed in visit on 10/15/17  Bayer DCA Hb A1c Waived  Result Value Ref Range   HB A1C (BAYER DCA - WAIVED) 6.1 <7.0 %  CMP14+EGFR  Result Value Ref Range   Glucose 99 65 - 99 mg/dL   BUN 12 8 - 27 mg/dL   Creatinine, Ser 1.24 (H) 0.57 - 1.00 mg/dL   GFR calc non Af Amer 44 (L) >59 mL/min/1.73   GFR calc Af Amer 51 (L) >59 mL/min/1.73   BUN/Creatinine Ratio 10 (L) 12 - 28   Sodium 142  134 - 144 mmol/L   Potassium 3.5 3.5 - 5.2 mmol/L   Chloride 101 96 - 106 mmol/L   CO2 27 20 - 29 mmol/L   Calcium 9.8 8.7 - 10.3 mg/dL   Total Protein 7.4 6.0 - 8.5 g/dL   Albumin 4.1 3.5 - 4.8 g/dL   Globulin, Total 3.3 1.5 - 4.5 g/dL   Albumin/Globulin Ratio 1.2 1.2 - 2.2   Bilirubin Total 0.4 0.0 - 1.2 mg/dL   Alkaline Phosphatase 56 39 - 117 IU/L   AST 13 0 - 40 IU/L   ALT 10 0 - 32 IU/L  Lipid panel  Result Value Ref Range   Cholesterol, Total 132 100 - 199 mg/dL   Triglycerides 121 0 - 149 mg/dL   HDL 50 >39 mg/dL   VLDL Cholesterol Cal 24 5 - 40 mg/dL   LDL Calculated 58 0 - 99 mg/dL   Chol/HDL Ratio 2.6 0.0 - 4.4 ratio      Assessment & Plan:   1. Shoulder impingement syndrome, right Moist heat. Strengthening exercises as tolerated. Tylenol (no NSAIDS due to to kidney function). Biofreeze or [facility] as needed and tolerated. Pt willing to try physical therapy, referral made.  - Ambulatory referral to Physical Therapy   Continue all other maintenance medications.  Follow up plan: Return if symptoms worsen or fail to improve.  Educational handout given for shoulder impingement syndrome  The above assessment and management plan was discussed with the patient. The patient verbalized understanding of and has agreed to the management plan. Patient is aware to call the clinic if symptoms persist or worsen. Patient is aware when to return to the clinic for a follow-up visit. Patient educated on when it is appropriate to go to the emergency department.   Monia Pouch, FNP-C [facility] [phone]

## 2017-12-02 NOTE — Telephone Encounter (Signed)
x

## 2017-12-18 ENCOUNTER — Other Ambulatory Visit: Payer: Self-pay | Admitting: *Deleted

## 2017-12-18 MED ORDER — GLUCOSE BLOOD VI STRP: ORAL_STRIP | 3 refills | Status: DC

## 2017-12-18 NOTE — Addendum Note (Signed)
Addended by: Julious Payer D on: 12/18/2017 04:42 PM   Modules accepted: Orders

## 2017-12-22 ENCOUNTER — Ambulatory Visit (INDEPENDENT_AMBULATORY_CARE_PROVIDER_SITE_OTHER): Payer: Medicare HMO | Admitting: *Deleted

## 2017-12-22 DIAGNOSIS — Z23 Encounter for immunization: Secondary | ICD-10-CM | POA: Diagnosis not present

## 2017-12-31 ENCOUNTER — Other Ambulatory Visit: Payer: Self-pay | Admitting: *Deleted

## 2017-12-31 ENCOUNTER — Telehealth: Payer: Self-pay | Admitting: Family Medicine

## 2017-12-31 DIAGNOSIS — E1122 Type 2 diabetes mellitus with diabetic chronic kidney disease: Secondary | ICD-10-CM

## 2017-12-31 DIAGNOSIS — N183 Chronic kidney disease, stage 3 (moderate): Principal | ICD-10-CM

## 2017-12-31 MED ORDER — PRODIGY TWIST TOP LANCETS 28G MISC: 2 refills | Status: DC

## 2017-12-31 NOTE — Telephone Encounter (Signed)
Patient aware.

## 2017-12-31 NOTE — Telephone Encounter (Signed)
Script for lancets sent to mail order.

## 2017-12-31 NOTE — Telephone Encounter (Signed)
What is the name of the medication? PRODIGY TWIST TOP LANCETS 28G MISC  Have you contacted your pharmacy to request a refill? no  Which pharmacy would you like this sent to? [facility]   Patient notified that their request is being sent to the clinical staff for review and that they should receive a call once it is complete. If they do not receive a call within 24 hours they can check with their pharmacy or our office.

## 2018-01-16 ENCOUNTER — Ambulatory Visit: Payer: Medicare HMO | Admitting: Family Medicine

## 2018-01-23 ENCOUNTER — Encounter: Payer: Medicare HMO | Admitting: *Deleted

## 2018-01-29 ENCOUNTER — Encounter: Payer: Self-pay | Admitting: Family Medicine

## 2018-01-29 ENCOUNTER — Ambulatory Visit (INDEPENDENT_AMBULATORY_CARE_PROVIDER_SITE_OTHER): Payer: Medicare HMO | Admitting: Family Medicine

## 2018-01-29 VITALS — BP 113/65 | HR 64 | Temp 96.6°F | Ht 62.0 in | Wt 150.2 lb

## 2018-01-29 DIAGNOSIS — E1169 Type 2 diabetes mellitus with other specified complication: Secondary | ICD-10-CM | POA: Diagnosis not present

## 2018-01-29 DIAGNOSIS — E1122 Type 2 diabetes mellitus with diabetic chronic kidney disease: Secondary | ICD-10-CM

## 2018-01-29 DIAGNOSIS — E785 Hyperlipidemia, unspecified: Secondary | ICD-10-CM

## 2018-01-29 DIAGNOSIS — I208 Other forms of angina pectoris: Secondary | ICD-10-CM

## 2018-01-29 DIAGNOSIS — N183 Chronic kidney disease, stage 3 (moderate): Secondary | ICD-10-CM

## 2018-01-29 DIAGNOSIS — E1159 Type 2 diabetes mellitus with other circulatory complications: Secondary | ICD-10-CM | POA: Diagnosis not present

## 2018-01-29 DIAGNOSIS — K2100 Gastro-esophageal reflux disease with esophagitis, without bleeding: Secondary | ICD-10-CM

## 2018-01-29 DIAGNOSIS — I10 Essential (primary) hypertension: Secondary | ICD-10-CM

## 2018-01-29 DIAGNOSIS — I152 Hypertension secondary to endocrine disorders: Secondary | ICD-10-CM

## 2018-01-29 LAB — BAYER DCA HB A1C WAIVED: HB A1C: 6.8 % (ref <7.0)

## 2018-01-29 MED ORDER — JANUMET XR 50-500 MG PO TB24: 1.0000 | ORAL_TABLET | Freq: Two times a day (BID) | ORAL | 3 refills | Status: DC

## 2018-01-29 MED ORDER — METOPROLOL SUCCINATE ER 100 MG PO TB24: ORAL_TABLET | ORAL | 3 refills | Status: DC

## 2018-01-29 MED ORDER — HYDRALAZINE HCL 10 MG PO TABS: 10.0000 mg | ORAL_TABLET | Freq: Two times a day (BID) | ORAL | 3 refills | Status: DC

## 2018-01-29 MED ORDER — LOSARTAN POTASSIUM-HCTZ 50-12.5 MG PO TABS: 1.0000 | ORAL_TABLET | Freq: Every day | ORAL | 3 refills | Status: DC

## 2018-01-29 MED ORDER — ATORVASTATIN CALCIUM 20 MG PO TABS: 20.0000 mg | ORAL_TABLET | Freq: Every day | ORAL | 3 refills | Status: DC

## 2018-01-29 NOTE — Addendum Note (Signed)
Addended by: Arville CareETTINGER, Telissa Palmisano on: 01/29/2018 11:26 AM   Modules accepted: Orders

## 2018-01-29 NOTE — Progress Notes (Addendum)
BP 113/65   Pulse 64   Temp (!) 96.6 F (35.9 C) (Oral)   Ht 5' 2"  (1.575 m)   Wt 150 lb 3.2 oz (68.1 kg)   BMI 27.47 kg/m    Subjective:    Patient ID: Tonya Carpenter, female    DOB: 05-11-1945, 72 y.o.   MRN: [id number]  HPI: Tonya Carpenter is a 72 y.o. female presenting on 01/29/2018 for Diabetes (3 month follow up) and Hypertension   HPI Hypertension Patient is currently on losartan-hydrochlorothiazide and metoprolol and hydralazine, and their blood pressure today is 113/65. Patient denies any lightheadedness or dizziness. Patient denies headaches, blurred vision, chest pains, shortness of breath, or weakness. Denies any side effects from medication and is content with current medication.   Type 2 diabetes mellitus Patient comes in today for recheck of his diabetes. Patient has been currently taking Janumet. Patient is currently on an ACE inhibitor/ARB. Patient has not seen an ophthalmologist this year because he is already legally blind. Patient denies any issues with their feet.   Hyperlipidemia Patient is coming in for recheck of his hyperlipidemia. The patient is currently taking no medication we are monitoring for now. They deny any issues with myalgias or history of liver damage from it. They deny any focal numbness or weakness or chest pain.   GERD Patient is currently on omeprazole.  She denies any major symptoms or abdominal pain or belching or burping. She denies any blood in her stool or lightheadedness or dizziness.   Relevant past medical, surgical, family and social history reviewed and updated as indicated. Interim medical history since our last visit reviewed. Allergies and medications reviewed and updated.  Review of Systems  Constitutional: Negative for chills and fever.  Eyes: Negative for redness and visual disturbance.  Respiratory: Negative for chest tightness and shortness of breath.   Cardiovascular: Negative for chest pain and leg swelling.    Musculoskeletal: Negative for back pain and gait problem.  Skin: Negative for rash.  Neurological: Negative for dizziness, weakness, light-headedness, numbness and headaches.  Psychiatric/Behavioral: Negative for agitation and behavioral problems.  All other systems reviewed and are negative.   Per HPI unless specifically indicated above   Allergies as of 01/29/2018   No Known Allergies     Medication List        Accurate as of 01/29/18 11:25 AM. Always use your most recent med list.          acetaminophen 500 MG tablet Commonly known as:  TYLENOL Take 1,000 mg by mouth every 6 (six) hours as needed.   ASPIRIN ADULT LOW STRENGTH 81 MG EC tablet Generic drug:  aspirin TAKE 1 TABLET EVERY DAY   atorvastatin 20 MG tablet Commonly known as:  LIPITOR Take 1 tablet (20 mg total) by mouth daily.   B-D SINGLE USE SWABS REGULAR Pads USE TO CHECK BLOOD SUGAR TWICE DAILY AND AS NEEDED   cholecalciferol 1000 units tablet Commonly known as:  VITAMIN D Take 1,000 Units by mouth daily.   glucose blood test strip USE  TO TEST BLOOD SUGAR TWICE DAILY   hydrALAZINE 10 MG tablet Commonly known as:  APRESOLINE Take 1 tablet (10 mg total) by mouth 2 (two) times daily.   JANUMET XR 50-500 MG Tb24 Generic drug:  SitaGLIPtin-MetFORMIN HCl Take 1 tablet by mouth 2 (two) times daily.   losartan-hydrochlorothiazide 50-12.5 MG tablet Commonly known as:  HYZAAR Take 1 tablet by mouth daily.   metoprolol succinate 100 MG  24 hr tablet Commonly known as:  TOPROL-XL TAKE 1 TABLET EVERY DAY  WITH  OR  IMMEDIATELY  FOLLOWING A MEAL   nitroGLYCERIN 0.4 MG SL tablet Commonly known as:  NITROSTAT PLACE 1 TABLET (0.4 MG TOTAL) UNDER THE TONGUE EVERY 5 (FIVE) MINUTES AS NEEDED. FOR CHEST PAIN   omeprazole 40 MG capsule Commonly known as:  PRILOSEC Take 1 capsule (40 mg total) by mouth daily.   ORTHOGEL Gel Apply 1 application topically daily as needed (pain).   polyethylene glycol  powder powder Commonly known as:  GLYCOLAX/MIRALAX Take 17 g by mouth 2 (two) times daily as needed.   PRODIGY TWIST TOP LANCETS 28G Misc TEST TWICE DAILY   TRUE METRIX LEVEL 2 Normal Soln Use as needed with BS machine          Objective:    BP 113/65   Pulse 64   Temp (!) 96.6 F (35.9 C) (Oral)   Ht 5' 2"  (1.575 m)   Wt 150 lb 3.2 oz (68.1 kg)   BMI 27.47 kg/m   Wt Readings from Last 3 Encounters:  01/29/18 150 lb 3.2 oz (68.1 kg)  12/02/17 151 lb (68.5 kg)  10/15/17 159 lb 9.6 oz (72.4 kg)    Physical Exam  Constitutional: She is oriented to person, place, and time. She appears well-developed and well-nourished. No distress.  Eyes: Conjunctivae are normal.  Neck: Neck supple. No thyromegaly present.  Cardiovascular: Normal rate, regular rhythm, normal heart sounds and intact distal pulses.  No murmur heard. Pulmonary/Chest: Effort normal and breath sounds normal. No respiratory distress. She has no wheezes.  Musculoskeletal: Normal range of motion. She exhibits no edema.  Lymphadenopathy:    She has no cervical adenopathy.  Neurological: She is alert and oriented to person, place, and time. Coordination normal.  Skin: Skin is warm and dry. No rash noted. She is not diaphoretic.  Psychiatric: She has a normal mood and affect. Her behavior is normal.  Nursing note and vitals reviewed.     Assessment & Plan:   Problem List Items Addressed This Visit      Cardiovascular and Mediastinum   Hypertension associated with diabetes ([place])   Relevant Medications   atorvastatin (LIPITOR) 20 MG tablet   hydrALAZINE (APRESOLINE) 10 MG tablet   losartan-hydrochlorothiazide (HYZAAR) 50-12.5 MG tablet   metoprolol succinate (TOPROL-XL) 100 MG 24 hr tablet   JANUMET XR 50-500 MG TB24   Other Relevant Orders   CMP14+EGFR     Digestive   GERD (gastroesophageal reflux disease)   Relevant Orders   CMP14+EGFR     Endocrine   Type 2 diabetes mellitus ([facility]) - Primary    Relevant Medications   atorvastatin (LIPITOR) 20 MG tablet   losartan-hydrochlorothiazide (HYZAAR) 50-12.5 MG tablet   JANUMET XR 50-500 MG TB24   Other Relevant Orders   CMP14+EGFR   Bayer DCA Hb A1c Waived   Hyperlipidemia associated with type 2 diabetes mellitus ([facility])   Relevant Medications   atorvastatin (LIPITOR) 20 MG tablet   losartan-hydrochlorothiazide (HYZAAR) 50-12.5 MG tablet   JANUMET XR 50-500 MG TB24    Other Visit Diagnoses    Atypical angina ([place])       Patient has been having intermittent occasional short chest pain and pressure on the left side of her chest, it has happened most recently a week ago   Relevant Medications   atorvastatin (LIPITOR) 20 MG tablet   hydrALAZINE (APRESOLINE) 10 MG tablet   losartan-hydrochlorothiazide (HYZAAR) 50-12.5 MG  tablet   metoprolol succinate (TOPROL-XL) 100 MG 24 hr tablet   Other Relevant Orders   Ambulatory referral to Cardiology      Follow up plan: Return in about 3 months (around 05/01/2018), or if symptoms worsen or fail to improve, for Diabetes and hypertension.  Counseling provided for all of the vaccine components Orders Placed This Encounter  Procedures  . CMP14+EGFR  . Bayer DCA Hb A1c Waived  . Ambulatory referral to Cardiology    Caryl Pina, MD [facility] 01/29/2018, 11:25 AM

## 2018-01-30 LAB — CMP14+EGFR
ALBUMIN: 4.5 g/dL (ref 3.5–4.8)
ALT: 12 IU/L (ref 0–32)
AST: 13 IU/L (ref 0–40)
Albumin/Globulin Ratio: 1.4 (ref 1.2–2.2)
Alkaline Phosphatase: 56 IU/L (ref 39–117)
BILIRUBIN TOTAL: 0.4 mg/dL (ref 0.0–1.2)
BUN / CREAT RATIO: 15 (ref 12–28)
BUN: 18 mg/dL (ref 8–27)
CALCIUM: 10.1 mg/dL (ref 8.7–10.3)
CHLORIDE: 101 mmol/L (ref 96–106)
CO2: 22 mmol/L (ref 20–29)
CREATININE: 1.24 mg/dL — AB (ref 0.57–1.00)
GFR calc non Af Amer: 44 mL/min/{1.73_m2} — ABNORMAL LOW (ref >59)
GFR, EST AFRICAN AMERICAN: 51 mL/min/{1.73_m2} — AB (ref >59)
GLUCOSE: 100 mg/dL — AB (ref 65–99)
Globulin, Total: 3.3 g/dL (ref 1.5–4.5)
Potassium: 4.3 mmol/L (ref 3.5–5.2)
Sodium: 143 mmol/L (ref 134–144)
TOTAL PROTEIN: 7.8 g/dL (ref 6.0–8.5)

## 2018-02-02 ENCOUNTER — Other Ambulatory Visit: Payer: Self-pay | Admitting: Family Medicine

## 2018-02-02 DIAGNOSIS — E1159 Type 2 diabetes mellitus with other circulatory complications: Secondary | ICD-10-CM

## 2018-02-02 DIAGNOSIS — I10 Essential (primary) hypertension: Principal | ICD-10-CM

## 2018-02-02 NOTE — Telephone Encounter (Signed)
Pt aware refills have been sent to [facility]

## 2018-02-23 ENCOUNTER — Encounter: Payer: Medicare HMO | Admitting: *Deleted

## 2018-02-24 ENCOUNTER — Ambulatory Visit (INDEPENDENT_AMBULATORY_CARE_PROVIDER_SITE_OTHER): Payer: Medicare HMO | Admitting: *Deleted

## 2018-02-24 ENCOUNTER — Ambulatory Visit (INDEPENDENT_AMBULATORY_CARE_PROVIDER_SITE_OTHER): Payer: Medicare HMO

## 2018-02-24 ENCOUNTER — Encounter: Payer: Self-pay | Admitting: *Deleted

## 2018-02-24 VITALS — BP 131/59 | HR 66 | Ht 62.0 in | Wt 153.0 lb

## 2018-02-24 DIAGNOSIS — Z780 Asymptomatic menopausal state: Secondary | ICD-10-CM

## 2018-02-24 DIAGNOSIS — Z23 Encounter for immunization: Secondary | ICD-10-CM

## 2018-02-24 DIAGNOSIS — Z0000 Encounter for general adult medical examination without abnormal findings: Secondary | ICD-10-CM | POA: Diagnosis not present

## 2018-02-24 NOTE — Patient Instructions (Signed)
Please work on your goal of continuing to exercise around 150 minutes per week, walking is a great option.  You received your 1st Shingrix (Shingles Vaccine), you will need the 2nd dose after 04/27/2018.  Please follow up with Dr. Warrick Parisian as scheduled.  Thank you for coming in for your Annual Wellness Visit today!!  Preventive Care 65 Years and Older, Female Preventive care refers to lifestyle choices and visits with your health care provider that can promote health and wellness. What does preventive care include?  A yearly physical exam. This is also called an annual well check.  Dental exams once or twice a year.  Routine eye exams. Ask your health care provider how often you should have your eyes checked.  Personal lifestyle choices, including: ? Daily care of your teeth and gums. ? Regular physical activity. ? Eating a healthy diet. ? Avoiding tobacco and drug use. ? Limiting alcohol use. ? Practicing safe sex. ? Taking low-dose aspirin every day. ? Taking vitamin and mineral supplements as recommended by your health care provider. What happens during an annual well check? The services and screenings done by your health care provider during your annual well check will depend on your age, overall health, lifestyle risk factors, and family history of disease. Counseling Your health care provider may ask you questions about your:  Alcohol use.  Tobacco use.  Drug use.  Emotional well-being.  Home and relationship well-being.  Sexual activity.  Eating habits.  History of falls.  Memory and ability to understand (cognition).  Work and work Statistician.  Reproductive health.  Screening You may have the following tests or measurements:  Height, weight, and BMI.  Blood pressure.  Lipid and cholesterol levels. These may be checked every 5 years, or more frequently if you are over 45 years old.  Skin check.  Lung cancer screening. You may have this  screening every year starting at age 55 if you have a 30-pack-year history of smoking and currently smoke or have quit within the past 15 years.  Fecal occult blood test (FOBT) of the stool. You may have this test every year starting at age 30.  Flexible sigmoidoscopy or colonoscopy. You may have a sigmoidoscopy every 5 years or a colonoscopy every 10 years starting at age 23.  Hepatitis C blood test.  Hepatitis B blood test.  Sexually transmitted disease (STD) testing.  Diabetes screening. This is done by checking your blood sugar (glucose) after you have not eaten for a while (fasting). You may have this done every 1-3 years.  Bone density scan. This is done to screen for osteoporosis. You may have this done starting at age 35.  Mammogram. This may be done every 1-2 years. Talk to your health care provider about how often you should have regular mammograms.  Talk with your health care provider about your test results, treatment options, and if necessary, the need for more tests. Vaccines Your health care provider may recommend certain vaccines, such as:  Influenza vaccine. This is recommended every year.  Tetanus, diphtheria, and acellular pertussis (Tdap, Td) vaccine. You may need a Td booster every 10 years.  Varicella vaccine. You may need this if you have not been vaccinated.  Zoster vaccine. You may need this after age 39.  Measles, mumps, and rubella (MMR) vaccine. You may need at least one dose of MMR if you were born in 1957 or later. You may also need a second dose.  Pneumococcal 13-valent conjugate (PCV13) vaccine. One dose  is recommended after age 68.  Pneumococcal polysaccharide (PPSV23) vaccine. One dose is recommended after age 71.  Meningococcal vaccine. You may need this if you have certain conditions.  Hepatitis A vaccine. You may need this if you have certain conditions or if you travel or work in places where you may be exposed to hepatitis A.  Hepatitis B  vaccine. You may need this if you have certain conditions or if you travel or work in places where you may be exposed to hepatitis B.  Haemophilus influenzae type b (Hib) vaccine. You may need this if you have certain conditions.  Talk to your health care provider about which screenings and vaccines you need and how often you need them. This information is not intended to replace advice given to you by your health care provider. Make sure you discuss any questions you have with your health care provider. Document Released: 03/31/2015 Document Revised: 11/22/2015 Document Reviewed: 01/03/2015 Elsevier Interactive Patient Education  2018 [employer].   Diabetes Mellitus and Nutrition When you have diabetes (diabetes mellitus), it is very important to have healthy eating habits because your blood sugar (glucose) levels are greatly affected by what you eat and drink. Eating healthy foods in the appropriate amounts, at about the same times every day, can help you:  Control your blood glucose.  Lower your risk of heart disease.  Improve your blood pressure.  Reach or maintain a healthy weight.  Every person with diabetes is different, and each person has different needs for a meal plan. Your health care provider may recommend that you work with a diet and nutrition specialist (dietitian) to make a meal plan that is best for you. Your meal plan may vary depending on factors such as:  The calories you need.  The medicines you take.  Your weight.  Your blood glucose, blood pressure, and cholesterol levels.  Your activity level.  Other health conditions you have, such as heart or kidney disease.  How do carbohydrates affect me? Carbohydrates affect your blood glucose level more than any other type of food. Eating carbohydrates naturally increases the amount of glucose in your blood. Carbohydrate counting is a method for keeping track of how many carbohydrates you eat. Counting  carbohydrates is important to keep your blood glucose at a healthy level, especially if you use insulin or take certain oral diabetes medicines. It is important to know how many carbohydrates you can safely have in each meal. This is different for every person. Your dietitian can help you calculate how many carbohydrates you should have at each meal and for snack. Foods that contain carbohydrates include:  Bread, cereal, rice, pasta, and crackers.  Potatoes and corn.  Peas, beans, and lentils.  Milk and yogurt.  Fruit and juice.  Desserts, such as cakes, cookies, ice cream, and candy.  How does alcohol affect me? Alcohol can cause a sudden decrease in blood glucose (hypoglycemia), especially if you use insulin or take certain oral diabetes medicines. Hypoglycemia can be a life-threatening condition. Symptoms of hypoglycemia (sleepiness, dizziness, and confusion) are similar to symptoms of having too much alcohol. If your health care provider says that alcohol is safe for you, follow these guidelines:  Limit alcohol intake to no more than 1 drink per day for nonpregnant women and 2 drinks per day for men. One drink equals 12 oz of beer, 5 oz of wine, or 1 oz of hard liquor.  Do not drink on an empty stomach.  Keep yourself hydrated  with water, diet soda, or unsweetened iced tea.  Keep in mind that regular soda, juice, and other mixers may contain a lot of sugar and must be counted as carbohydrates.  What are tips for following this plan? Reading food labels  Start by checking the serving size on the label. The amount of calories, carbohydrates, fats, and other nutrients listed on the label are based on one serving of the food. Many foods contain more than one serving per package.  Check the total grams (g) of carbohydrates in one serving. You can calculate the number of servings of carbohydrates in one serving by dividing the total carbohydrates by 15. For example, if a food has 30 g  of total carbohydrates, it would be equal to 2 servings of carbohydrates.  Check the number of grams (g) of saturated and trans fats in one serving. Choose foods that have low or no amount of these fats.  Check the number of milligrams (mg) of sodium in one serving. Most people should limit total sodium intake to less than 2,300 mg per day.  Always check the nutrition information of foods labeled as "low-fat" or "nonfat". These foods may be higher in added sugar or refined carbohydrates and should be avoided.  Talk to your dietitian to identify your daily goals for nutrients listed on the label. Shopping  Avoid buying canned, premade, or processed foods. These foods tend to be high in fat, sodium, and added sugar.  Shop around the outside edge of the grocery store. This includes fresh fruits and vegetables, bulk grains, fresh meats, and fresh dairy. Cooking  Use low-heat cooking methods, such as baking, instead of high-heat cooking methods like deep frying.  Cook using healthy oils, such as olive, canola, or sunflower oil.  Avoid cooking with butter, cream, or high-fat meats. Meal planning  Eat meals and snacks regularly, preferably at the same times every day. Avoid going long periods of time without eating.  Eat foods high in fiber, such as fresh fruits, vegetables, beans, and whole grains. Talk to your dietitian about how many servings of carbohydrates you can eat at each meal.  Eat 4-6 ounces of lean protein each day, such as lean meat, chicken, fish, eggs, or tofu. 1 ounce is equal to 1 ounce of meat, chicken, or fish, 1 egg, or 1/4 cup of tofu.  Eat some foods each day that contain healthy fats, such as avocado, nuts, seeds, and fish. Lifestyle   Check your blood glucose regularly.  Exercise at least 30 minutes 5 or more days each week, or as told by your health care provider.  Take medicines as told by your health care provider.  Do not use any products that contain  nicotine or tobacco, such as cigarettes and e-cigarettes. If you need help quitting, ask your health care provider.  Work with a Social worker or diabetes educator to identify strategies to manage stress and any emotional and social challenges. What are some questions to ask my health care provider?  Do I need to meet with a diabetes educator?  Do I need to meet with a dietitian?  What number can I call if I have questions?  When are the best times to check my blood glucose? Where to find more information:  American Diabetes Association: diabetes.org/food-and-fitness/food  Academy of Nutrition and Dietetics: PokerClues.dk  [employer] of Diabetes and Digestive and Kidney Diseases (NIH): ContactWire.be Summary  A healthy meal plan will help you control your blood glucose and maintain a healthy  lifestyle.  Working with a diet and nutrition specialist (dietitian) can help you make a meal plan that is best for you.  Keep in mind that carbohydrates and alcohol have immediate effects on your blood glucose levels. It is important to count carbohydrates and to use alcohol carefully. This information is not intended to replace advice given to you by your health care provider. Make sure you discuss any questions you have with your health care provider. Document Released: 11/29/2004 Document Revised: 04/08/2016 Document Reviewed: 04/08/2016 Elsevier Interactive Patient Education  [employer].

## 2018-02-24 NOTE — Progress Notes (Signed)
Subjective:   Tonya Carpenter is a 72 y.o. female who presents for Medicare Annual (Subsequent) preventive examination.  Tonya Carpenter is accompanied today by her husband, who is alslo here for an Annual Wellness Visit. They live locally within walking distance of our office in an apartment.  Tonya Carpenter enjoys being involved with her church, participating in programs with the [facility], and cooking.  She has 2 sons that also live locally.  Mrs.  Tonya Carpenter feels her health is unchanged from last year.  She reports no ER visits, hospitalizations, or surgeries in the past year.  Tonya Carpenter states that she and her husband use local transportation services for traveling within the county  She states her cousin Tonya Carpenter takes them to their out of town appointsmenta    Review of Systems:   Musculoskeletal - Right shoulder pain Other systems negative   Cardiac Risk Factors include: diabetes mellitus;advanced age (>2055men, 60>65 women);dyslipidemia;hypertension     Objective:     Vitals: BP (!) 131/59   Pulse 66   Wt 153 lb (69.4 kg)   BMI 27.98 kg/m   Body mass index is 27.98 kg/m.  Advanced Directives 02/24/2018 01/14/2017 02/12/2016 01/10/2016 11/08/2014 11/04/2014 10/25/2014  Does Patient Have a Medical Advance Directive? No No No No No No No  Would patient like information on creating a medical advance directive? No - Patient declined No - Patient declined - No - patient declined information No - patient declined information - No - patient declined information    Tobacco Social History   Tobacco Use  Smoking Status Never Smoker  Smokeless Tobacco Never Used       Clinical Intake:     Pain Score: 9  right shoulder                  Past Medical History:  Diagnosis Date  . Anxiety   . Arthritis    "hands" (01/14/2013)  . Asthma   . Cataract   . Coronary artery disease   . Exertional shortness of breath   . Hyperlipidemia   . Hypertension   . Myocardial infarction [facility](HCC)     "several years back" (01/14/2013)  . Stroke [facility](HCC)    "light stroke long years ago; didn't affect me permanently" (01/14/2013)  . Type 2 diabetes mellitus (HCC)    Past Surgical History:  Procedure Laterality Date  . APPENDECTOMY    . CARDIAC CATHETERIZATION  2008   no angiographically significant CAD  . CARDIAC CATHETERIZATION  01/14/2013  . CARDIOVASCULAR STRESS TEST  12/2009   no evidence for stress-induced reversibility or ischemia.  She had a fixed anterior septal wall defect, possibly related to breast attenuation and her EF was 54%.  Marland Kitchen. CATARACT EXTRACTION W/PHACO Right 10/25/2014   Procedure: CATARACT EXTRACTION PHACO AND INTRAOCULAR LENS PLACEMENT; CDE:  17.28;  Surgeon: Jethro BolusMark Shapiro, MD;  Location: [facility];  Service: Ophthalmology;  Laterality: Right;  . CATARACT EXTRACTION W/PHACO Left 11/08/2014   Procedure: CATARACT EXTRACTION PHACO AND INTRAOCULAR LENS PLACEMENT (IOC);  Surgeon: Jethro BolusMark Shapiro, MD;  Location: [facility];  Service: Ophthalmology;  Laterality: Left;  CDE: 5.94  . CHOLECYSTECTOMY    . DILATION AND CURETTAGE OF UTERUS    . LEFT HEART CATHETERIZATION WITH CORONARY ANGIOGRAM N/A 01/14/2013   Procedure: LEFT HEART CATHETERIZATION WITH CORONARY ANGIOGRAM;  Surgeon: Kathleene Hazelhristopher D McAlhany, MD;  [facility];  Service: Cardiovascular;  Laterality: N/A;  . TONSILLECTOMY    . TUBAL LIGATION    .  VAGINAL HYSTERECTOMY     Family History  Problem Relation Age of Onset  . Hypertension Mother   . Hypertension Father   . Cancer Sister        breast  . Heart Problems Son   . Seizures Son    Social History   Socioeconomic History  . Marital status: Married    Spouse name: Not on file  . Number of children: 2  . Years of education: Not on file  . Highest education level: Not on file  Occupational History  . Occupation: Disability    Employer: UNEMPLOYED  Social Needs  . Financial resource strain: Not very hard  . Food insecurity:    Worry: Never true     Inability: Never true  . Transportation needs:    Medical: No    Non-medical: No  Tobacco Use  . Smoking status: Never Smoker  . Smokeless tobacco: Never Used  Substance and Sexual Activity  . Alcohol use: No  . Drug use: No  . Sexual activity: Not on file  Lifestyle  . Physical activity:    Days per week: 6 days    Minutes per session: 60 min  . Stress: Only a little  Relationships  . Social connections:    Talks on phone: More than three times a week    Gets together: Twice a week    Attends religious service: More than 4 times per year    Active member of club or organization: Yes    Attends meetings of clubs or organizations: More than 4 times per year    Relationship status: Married  Other Topics Concern  . Not on file  Social History Narrative   Lives in [place], [place].     Outpatient Encounter Medications as of 02/24/2018  Medication Sig  . acetaminophen (TYLENOL) 500 MG tablet Take 1,000 mg by mouth every 6 (six) hours as needed.  . Alcohol Swabs (B-D SINGLE USE SWABS REGULAR) PADS USE TO CHECK BLOOD SUGAR TWICE DAILY AND AS NEEDED  . ASPIRIN ADULT LOW STRENGTH 81 MG EC tablet TAKE 1 TABLET EVERY DAY  . atorvastatin (LIPITOR) 20 MG tablet Take 1 tablet (20 mg total) by mouth daily.  . Blood Glucose Calibration (TRUE METRIX LEVEL 2) Normal SOLN Use as needed with BS machine  . cholecalciferol (VITAMIN D) 1000 units tablet Take 1,000 Units by mouth daily.  Marland Kitchen glucose blood (PRODIGY NO CODING BLOOD GLUC) test strip USE  TO TEST BLOOD SUGAR TWICE DAILY  . hydrALAZINE (APRESOLINE) 10 MG tablet Take 1 tablet (10 mg total) by mouth 2 (two) times daily.  Marland Kitchen JANUMET XR 50-500 MG TB24 Take 1 tablet by mouth 2 (two) times daily.  . Liniments (ORTHOGEL) GEL Apply 1 application topically daily as needed (pain).   Marland Kitchen losartan-hydrochlorothiazide (HYZAAR) 50-12.5 MG tablet Take 1 tablet by mouth daily.  . metoprolol succinate (TOPROL-XL) 100 MG 24 hr tablet TAKE 1 TABLET EVERY DAY  WITH   OR  IMMEDIATELY  FOLLOWING A MEAL  . nitroGLYCERIN (NITROSTAT) 0.4 MG SL tablet PLACE 1 TABLET (0.4 MG TOTAL) UNDER THE TONGUE EVERY 5 (FIVE) MINUTES AS NEEDED. FOR CHEST PAIN  . omeprazole (PRILOSEC) 40 MG capsule Take 1 capsule (40 mg total) by mouth daily.  . polyethylene glycol powder (GLYCOLAX/MIRALAX) powder Take 17 g by mouth 2 (two) times daily as needed.  Marland Kitchen PRODIGY TWIST TOP LANCETS 28G MISC TEST TWICE DAILY   No facility-administered encounter medications on file as of 02/24/2018.  Activities of Daily Living In your present state of health, do you have any difficulty performing the following activities: 02/24/2018  Hearing? N  Vision? N  Difficulty concentrating or making decisions? Y  Comment Trouble remembering  Walking or climbing stairs? N  Dressing or bathing? N  Doing errands, shopping? N  Preparing Food and eating ? N  Using the Toilet? N  In the past six months, have you accidently leaked urine? N  Do you have problems with loss of bowel control? N  Managing your Medications? N  Managing your Finances? N  Housekeeping or managing your Housekeeping? N  Some recent data might be hidden    Patient Care Team: [name], Elige Radon, MD as PCP - General (Family Medicine) [place] Bali, MD as Consulting Physician (Gastroenterology) Jethro Bolus, MD as Consulting Physician (Ophthalmology)    Assessment:   This is a routine wellness examination for [facility].  Exercise Activities and Dietary recommendations  Patient states she eats 3 meals per day.  Usually egg whites and bacon or sausage for breakfast, a sandwich for lunch, and protein such as [place], green beans, potatoes and cornbread for supper.  Recommended decreasing processed meats such as bacon or sausage to once per week.  Recommended diet of mostly lean proteins, non-starchy vegetables, and whole grains and fruits in moderation.  Diabetic nutrition handout papers given today.  Patient states she has had access  to and resources for all the food she needs over the past year.  She states needing more access to food has been an issue in the past.  Information given on local food pantries incase this is ever an issue in the future.     Current Exercise Habits: Home exercise routine, Type of exercise: walking, Time (Minutes): 60, Frequency (Times/Week): 6, Weekly Exercise (Minutes/Week): 360, Intensity: Moderate  Goals    . Exercise 150 minutes per week (moderate activity)       Fall Risk Fall Risk  01/29/2018 12/02/2017 10/15/2017 07/15/2017 04/16/2017  Falls in the past year? 0 No No No No  Number falls in past yr: - - - - -  Injury with Fall? - - - - -  Risk Factor Category  - - - - -  Risk for fall due to : - - - - -  Follow up - - - - -   Is the patient's home free of loose throw rugs in walkways, pet beds, electrical cords, etc?   no, recommended removing throw rugs       Grab bars in the bathroom? yes      Handrails on the stairs?   no stairs in home      Adequate lighting?   yes    Depression Screen PHQ 2/9 Scores 02/24/2018 01/29/2018 12/02/2017 10/15/2017  PHQ - 2 Score 1 0 0 0  PHQ- 9 Score - - - -  Exception Documentation - - - -  Not completed - - - -     Cognitive Function MMSE - Mini Mental State Exam 01/14/2017 01/10/2016  Orientation to time 3 5  Orientation to Place 4 5  Registration 3 3  Attention/ Calculation 0 -  Recall 2 0  Language- name 2 objects 2 2  Language- repeat 0 1  Language- follow 3 step command 3 3  Language- read & follow direction 1 1  Write a sentence 1 1  Copy design 0 1  Total score 19 -     6CIT Screen 02/24/2018  What  Year? 0 points  What month? 0 points  What time? 0 points  Count back from 20 4 points  Months in reverse 4 points  Repeat phrase 2 points  Total - 10 points      Immunization History  Administered Date(s) Administered  . Influenza, High Dose Seasonal PF 12/22/2017  . Influenza,inj,Quad PF,6+ Mos 12/18/2012,  02/02/2014, 01/03/2015, 01/10/2016, 01/13/2017  . Pneumococcal Conjugate-13 12/23/2012  . Pneumococcal Polysaccharide-23 05/25/2014  . Tdap 07/19/2013  . Zoster 06/04/2013  . Zoster Recombinat (Shingrix) 02/24/2018    Qualifies for Shingles Vaccine?yes, first dose given today  Screening Tests Health Maintenance  Topic Date Due  . FOOT EXAM  10/03/2015  . OPHTHALMOLOGY EXAM  03/25/2018  . MAMMOGRAM  04/15/2018  . HEMOGLOBIN A1C  07/30/2018  . COLONOSCOPY  10/21/2018  . TETANUS/TDAP  07/20/2023  . INFLUENZA VACCINE  Completed  . DEXA SCAN  Completed  . Hepatitis C Screening  Completed  . PNA vac Low Risk Adult  Completed   Recommend foot exam at next visit with Dr [name] Scheduled mammogram for [facility]/28/17 at [facility] on mobile unit.      Cancer Screenings: Lung: Low Dose CT Chest recommended if Age 5-80 years, 30 pack-year currently smoking OR have quit w/in 15years. Patient does not qualify. Breast:  Up to date on Mammogram? Yes, scheduled  Up to date of Bone Density/Dexa? Yes, done today  Colorectal: Yes   Additional Screenings:  Hepatitis C Screening: Completed 01/10/16     Plan:     Work on your goal of continuing to exercise around 150 minutes per week, walking is a great option. You received your 1st Shingrix (Shingles Vaccine), you will need the 2nd dose after 04/27/2018. Follow up with Dr. Louanne Skye as scheduled.     I have personally reviewed and noted the following in the patient's chart:   . Medical and social history . Use of alcohol, tobacco or illicit drugs  . Current medications and supplements . Functional ability and status . Nutritional status . Physical activity . Advanced directives . List of other physicians . Hospitalizations, surgeries, and ER visits in previous 12 months . Vitals . Screenings to include cognitive, depression, and falls . Referrals and appointments  In addition, I have reviewed and discussed with patient certain  preventive protocols, quality metrics, and best practice recommendations. A written personalized care plan for preventive services as well as general preventive health recommendations were provided to patient.     Bernadene Bell, RN  02/24/2018

## 2018-02-25 DIAGNOSIS — Z780 Asymptomatic menopausal state: Secondary | ICD-10-CM | POA: Diagnosis not present

## 2018-02-25 DIAGNOSIS — Z13820 Encounter for screening for osteoporosis: Secondary | ICD-10-CM | POA: Diagnosis not present

## 2018-02-26 ENCOUNTER — Ambulatory Visit: Payer: 59 | Admitting: Family Medicine

## 2018-04-20 NOTE — Progress Notes (Signed)
Cardiology Office Note   Date:  04/22/2018   ID:  Sanna, Gunby [birth year], MRN [id number]  PCP:  [name], Elige Radon, MD  Cardiologist:   No primary care provider on file. Referring:  [name], Elige Radon, MD  Chief Complaint  Patient presents with  . Chest Pain      History of Present Illness: Tonya Carpenter is a 73 y.o. female who is referred by [name], Elige Radon, MD for evaluation of chest pain.  She has had cardiac work up in the past.  Echo in 2011 demonstrated a low normal EF.  There was a small pericardial effusion.   Cardiac cath in 2014 demonstrated only mild coronary plaque.    She says that she gets chest discomfort but she says this is been very stable pattern and is infrequent.  She has not had taken nitroglycerin in a long time.  She is taken these for years.  She does not think the pattern is changed since she had her last heart catheterization.  She says the pain is somewhat sharp and fleeting.  She cannot bring it on.  She does some walking down to the store and does not bring this on.  She does not describe any shortness of breath.  She does not describe PND or orthopnea.  She has no palpitations.  She does have occasional dizziness.  She has some episodes where her blood sugar drops and she feels lightheaded.  She has not had frank syncope however.  She does not describe orthostatic changes.  The dizziness is somewhat sporadic.   Past Medical History:  Diagnosis Date  . Anxiety   . Arthritis    "hands" (01/14/2013)  . Asthma   . Cataract   . Coronary artery disease    Non obstructive CAD 2014 cath.   . Exertional shortness of breath   . Hyperlipidemia   . Hypertension   . Stroke [facility])    "light stroke long years ago; didn't affect me permanently" (01/14/2013)  . Type 2 diabetes mellitus (HCC)     Past Surgical History:  Procedure Laterality Date  . APPENDECTOMY    . CARDIAC CATHETERIZATION  2008   no angiographically significant CAD  .  CARDIOVASCULAR STRESS TEST  12/2009   no evidence for stress-induced reversibility or ischemia.  She had a fixed anterior septal wall defect, possibly related to breast attenuation and her EF was 54%.  Mar[facility] Kitchen CATARACT EXTRACTION W/PHACO Right 10/25/2014   Procedure: CATARACT EXTRACTION PHACO AND INTRAOCULAR LENS PLACEMENT; CDE:  17.28;  Surgeon: Jethro Bolus, MD;  Location: [facility];  Service: Ophthalmology;  Laterality: Right;  . CATARACT EXTRACTION W/PHACO Left 11/08/2014   Procedure: CATARACT EXTRACTION PHACO AND INTRAOCULAR LENS PLACEMENT (IOC);  Surgeon: Jethro Bolus, MD;  Location: [facility];  Service: Ophthalmology;  Laterality: Left;  CDE: 5.94  . CHOLECYSTECTOMY    . DILATION AND CURETTAGE OF UTERUS    . LEFT HEART CATHETERIZATION WITH CORONARY ANGIOGRAM N/A 01/14/2013   Procedure: LEFT HEART CATHETERIZATION WITH CORONARY ANGIOGRAM;  Surgeon: Kathleene Hazel, MD;  Location: [facility] CATH LAB;  Service: Cardiovascular;  Laterality: N/A;  . TONSILLECTOMY    . TUBAL LIGATION    . VAGINAL HYSTERECTOMY       Current Outpatient Medications  Medication Sig Dispense Refill  . acetaminophen (TYLENOL) 500 MG tablet Take 1,000 mg by mouth every 6 (six) hours as needed.    . ASPIRIN ADULT LOW STRENGTH 81 MG EC tablet TAKE 1 TABLET  EVERY DAY 90 tablet 1  . atorvastatin (LIPITOR) 20 MG tablet Take 1 tablet (20 mg total) by mouth daily. 90 tablet 3  . cholecalciferol (VITAMIN D) 1000 units tablet Take 1,000 Units by mouth daily.    . hydrALAZINE (APRESOLINE) 10 MG tablet Take 1 tablet (10 mg total) by mouth 2 (two) times daily. 180 tablet 3  . JANUMET XR 50-500 MG TB24 Take 1 tablet by mouth 2 (two) times daily. 90 tablet 3  . losartan-hydrochlorothiazide (HYZAAR) 50-12.5 MG tablet Take 1 tablet by mouth daily. 90 tablet 3  . nitroGLYCERIN (NITROSTAT) 0.4 MG SL tablet PLACE 1 TABLET (0.4 MG TOTAL) UNDER THE TONGUE EVERY 5 (FIVE) MINUTES AS NEEDED. FOR CHEST PAIN 25 tablet 0  . omeprazole (PRILOSEC) 40 MG  capsule Take 1 capsule (40 mg total) by mouth daily. 90 capsule 3  . polyethylene glycol powder (GLYCOLAX/MIRALAX) powder Take 17 g by mouth 2 (two) times daily as needed. 3350 g 1  . Alcohol Swabs (B-D SINGLE USE SWABS REGULAR) PADS USE TO CHECK BLOOD SUGAR TWICE DAILY AND AS NEEDED 300 each 2  . Blood Glucose Calibration (TRUE METRIX LEVEL 2) Normal SOLN Use as needed with BS machine 1 each 3  . glucose blood (PRODIGY NO CODING BLOOD GLUC) test strip USE  TO TEST BLOOD SUGAR TWICE DAILY 200 each 3  . Liniments (ORTHOGEL) GEL Apply 1 application topically daily as needed (pain).     . metoprolol succinate (TOPROL-XL) 50 MG 24 hr tablet Take 1 tablet (50 mg total) by mouth daily. Take with or immediately following a meal. 90 tablet 3  . PRODIGY TWIST TOP LANCETS 28G MISC TEST TWICE DAILY 200 each 2   No current facility-administered medications for this visit.     Allergies:   Patient has no known allergies.    Social History:  The patient  reports that she has never smoked. She has never used smokeless tobacco. She reports that she does not drink alcohol or use drugs.   Family History:  The patient's family history includes Cancer in her sister; Heart Problems in her son; Hypertension in her father and mother; Seizures in her son.    ROS:  Please see the history of present illness.   Otherwise, review of systems are positive for none.   All other systems are reviewed and negative.    PHYSICAL EXAM: VS:  BP 120/60   Pulse 70   Ht 5\' 2"  (1.575 m)   Wt 150 lb (68 kg)   BMI 27.44 kg/m  , BMI Body mass index is 27.44 kg/m. GENERAL:  Well appearing HEENT:  Pupils equal round and reactive, fundi not visualized, oral mucosa unremarkable NECK:  No jugular venous distention, waveform within normal limits, carotid upstroke brisk and symmetric, no bruits, no thyromegaly LYMPHATICS:  No cervical, inguinal adenopathy LUNGS:  Clear to auscultation bilaterally BACK:  No CVA tenderness CHEST:   Unremarkable HEART:  PMI not displaced or sustained,S1 and S2 within normal limits, no S3, no S4, no clicks, no rubs, no murmurs ABD:  Flat, positive bowel sounds normal in frequency in pitch, no bruits, no rebound, no guarding, no midline pulsatile mass, no hepatomegaly, no splenomegaly EXT:  2 plus pulses throughout, no edema, no cyanosis no clubbing SKIN:  No rashes no nodules NEURO:  Cranial nerves II through XII grossly intact, motor grossly intact throughout PSYCH:  Cognitively intact, oriented to person place and time    EKG:  EKG is ordered today. The  ekg ordered today demonstrates sinus rhythm, rate 78, left bundle branch block.  This is not changed from 2017   Recent Labs: 01/29/2018: ALT 12; BUN 18; Creatinine, Ser 1.24; Potassium 4.3; Sodium 143    Lipid Panel    Component Value Date/Time   CHOL 132 10/15/2017 0902   CHOL 148 07/10/2012 1147   TRIG 121 10/15/2017 0902   TRIG 85 07/10/2012 1147   HDL 50 10/15/2017 0902   HDL 51 07/10/2012 1147   CHOLHDL 2.6 10/15/2017 0902   LDLCALC 58 10/15/2017 0902   LDLCALC 80 07/10/2012 1147      Wt Readings from Last 3 Encounters:  04/22/18 150 lb (68 kg)  02/24/18 153 lb (69.4 kg)  01/29/18 150 lb 3.2 oz (68.1 kg)      Other studies Reviewed: Additional studies/ records that were reviewed today include: Labs. Review of the above records demonstrates:  Please see elsewhere in the note.     ASSESSMENT AND PLAN:  CHEST PAIN:   Her chest pain is atypical in a stable pattern.  She is had no obstructive coronary disease in the past.  Despite the risk factors I do not think the pretest probability of obstructive coronary disease as an etiology is high.  I will not plan any further testing at this point.  HTN: Blood pressure is controlled.  I will be making the change in her medicines as below.  DYSLIPIDEMIA: LDL was at target at 58.  No change in therapy.  DM: I reviewed a list of blood sugars that she takes in records  and these are excellent.  Her A1c is 6.8.  Management per [name], Elige Radon, MD  LBBB:   This is chronic.  She does have some dizziness.  There is a small possibility this could be related to some bradycardia arrhythmias.  I am going to reduce her metoprolol to 50 mg daily.  See her back to see if she is had improvement in the symptoms.   Current medicines are reviewed at length with the patient today.  The patient does not have concerns regarding medicines.  The following changes have been made:   As above  Labs/ tests ordered today include: None  Orders Placed This Encounter  Procedures  . EKG 12-Lead     Disposition:   FU with me in 3 months.     Signed, Rollene Rotunda, MD  04/22/2018 12:23 PM    [facility]

## 2018-04-22 ENCOUNTER — Encounter: Payer: Self-pay | Admitting: Cardiology

## 2018-04-22 ENCOUNTER — Ambulatory Visit: Payer: Medicare HMO | Admitting: Cardiology

## 2018-04-22 VITALS — BP 120/60 | HR 70 | Ht 62.0 in | Wt 150.0 lb

## 2018-04-22 DIAGNOSIS — E785 Hyperlipidemia, unspecified: Secondary | ICD-10-CM | POA: Diagnosis not present

## 2018-04-22 DIAGNOSIS — R079 Chest pain, unspecified: Secondary | ICD-10-CM | POA: Diagnosis not present

## 2018-04-22 DIAGNOSIS — I10 Essential (primary) hypertension: Secondary | ICD-10-CM | POA: Diagnosis not present

## 2018-04-22 DIAGNOSIS — R42 Dizziness and giddiness: Secondary | ICD-10-CM | POA: Diagnosis not present

## 2018-04-22 DIAGNOSIS — I447 Left bundle-branch block, unspecified: Secondary | ICD-10-CM | POA: Diagnosis not present

## 2018-04-22 MED ORDER — METOPROLOL SUCCINATE ER 50 MG PO TB24: 50.0000 mg | ORAL_TABLET | Freq: Every day | ORAL | 3 refills | Status: DC

## 2018-04-22 NOTE — Patient Instructions (Signed)
Medication Instructions:  Please decrease your Metoprolol to 50 mg a day. This will be 1/2 tablet of the bottle you have now. When your new bottle comes in the mail - take 1 a day. Continue all other medications as listed.  If you need a refill on your cardiac medications before your next appointment, please call your pharmacy.    Follow-Up: Follow up in 2 months with Dr Antoine Poche.  Thank you for choosing [facility]!!

## 2018-04-29 ENCOUNTER — Telehealth: Payer: Self-pay | Admitting: Family Medicine

## 2018-04-29 NOTE — Telephone Encounter (Signed)
Will discuss with Dr. Louanne Skye at appt on 05/07/2018

## 2018-05-02 ENCOUNTER — Ambulatory Visit (INDEPENDENT_AMBULATORY_CARE_PROVIDER_SITE_OTHER): Payer: Medicare HMO | Admitting: Family Medicine

## 2018-05-02 ENCOUNTER — Encounter: Payer: Self-pay | Admitting: Family Medicine

## 2018-05-02 VITALS — BP 125/76 | HR 63 | Temp 97.7°F | Ht 62.0 in | Wt 148.0 lb

## 2018-05-02 DIAGNOSIS — M7551 Bursitis of right shoulder: Secondary | ICD-10-CM | POA: Diagnosis not present

## 2018-05-02 MED ORDER — BETAMETHASONE SOD PHOS & ACET 6 (3-3) MG/ML IJ SUSP
6.0000 mg | Freq: Once | INTRAMUSCULAR | Status: AC
  Administered 2018-05-02: 6 mg via INTRAMUSCULAR

## 2018-05-02 NOTE — Progress Notes (Signed)
Chief Complaint  Patient presents with  . Shoulder Pain    right shoulder pain    HPI  Patient presents today for right shoulder pain.  Pain is moderate in severity but it limits her activities.  She mentions that she has an elderly husband who is been sick she tries to take care of and she has been able to cook for him because of the pain in her shoulder nor she able to clean their home.  It is located at the ball of the shoulder just laterally on the right near the margin of the deltoid as it inserts at the humerus.  It is painful for all range of motion as well as when she sitting still.  There is no known injury.  She states that she has had bursitis in that shoulder before many years ago.  This time it started hurting several months ago.  It is been building until recently over the last week or 2 she has been unable to go about those activities mentioned above. PMH: Smoking status noted ROS: Per HPI  Objective: BP 125/76   Pulse 63   Temp 97.7 F (36.5 C)   Ht 5\' 2"  (1.575 m)   Wt 148 lb (67.1 kg)   BMI 27.07 kg/m  Gen: NAD, alert, cooperative with exam HEENT: NCAT, EOMI, PERRL Ext: No edema, warm.  Right shoulder has full passive range of motion.  Some pain with abducted and full external rotation.  There is tenderness over the belly of the deltoid laterally.  No edema.  No lesions.  Bursitis Neuro: Alert and oriented, No gross deficits  Assessment and plan:  1. Bursitis of right shoulder     Meds ordered this encounter  Medications  . betamethasone acetate-betamethasone sodium phosphate (CELESTONE) injection 6 mg    Patient requested the injection rather than oral medication.  She is concerned about her sugar dropping too low at times.  I assured her that this injection would be more likely to make her sugar climb.  If she has further lows then she should follow-up with Dr. Louanne Skye as soon as possible.  Follow up as needed.  Mechele Claude, MD

## 2018-05-06 ENCOUNTER — Telehealth: Payer: Self-pay | Admitting: Family Medicine

## 2018-05-06 MED ORDER — DICLOFENAC SODIUM 1 % TD GEL: 2.0000 g | Freq: Four times a day (QID) | TRANSDERMAL | 1 refills | Status: DC

## 2018-05-06 NOTE — Telephone Encounter (Signed)
Can we send in a rub on medication and/or pill that may also help with her shoulder pain?

## 2018-05-06 NOTE — Telephone Encounter (Signed)
Attempted to contact patient - NA °

## 2018-05-06 NOTE — Telephone Encounter (Signed)
PT has called states that she had a [facility] done [facility] an she is hurting REALLY bad states that she has used Hot pad and Tylenol and it is not working

## 2018-05-06 NOTE — Telephone Encounter (Signed)
Forwarded to PCP.

## 2018-05-06 NOTE — Telephone Encounter (Signed)
Please let patient know that I sent in Voltaren gel for her.  She can rub this on her shoulder and see if it helps and then let us know from there.

## 2018-05-07 ENCOUNTER — Ambulatory Visit: Payer: Medicare HMO | Admitting: Family Medicine

## 2018-05-08 ENCOUNTER — Telehealth: Payer: Self-pay | Admitting: Family Medicine

## 2018-05-08 MED ORDER — GLUCERNA PO LIQD: 237.0000 mL | Freq: Two times a day (BID) | ORAL | 2 refills | Status: DC

## 2018-05-08 NOTE — Telephone Encounter (Signed)
Recommended Glucerna.  Rx sent to pharmacy.

## 2018-05-08 NOTE — Telephone Encounter (Signed)
She says she has been using a gel rub in with some relief.   Advised ice may be helpful for twenty minutes on and off.

## 2018-05-12 DIAGNOSIS — Z1231 Encounter for screening mammogram for malignant neoplasm of breast: Secondary | ICD-10-CM | POA: Diagnosis not present

## 2018-05-12 LAB — HM MAMMOGRAPHY

## 2018-05-13 ENCOUNTER — Other Ambulatory Visit: Payer: Self-pay | Admitting: Family Medicine

## 2018-05-18 ENCOUNTER — Telehealth: Payer: Self-pay | Admitting: Family Medicine

## 2018-05-18 NOTE — Telephone Encounter (Signed)
PT is wanting to talk to Dr [name] nurse to see about getting an CNA or Aid to help her.

## 2018-05-18 NOTE — Telephone Encounter (Signed)
Patient is requesting help with light household duties as her right shoulder pain will not allow her to cook and clean.  Dr. Louanne Skye please advise.

## 2018-05-19 ENCOUNTER — Other Ambulatory Visit: Payer: Self-pay | Admitting: Family Medicine

## 2018-05-19 NOTE — Telephone Encounter (Signed)
I do not know that her insurance would pay for help based on right shoulder pain but if she wanted to try for it she would have to come in for a visit because she has Medicare and it requires a face-to-face

## 2018-05-19 NOTE — Telephone Encounter (Signed)
Na-cb 3/3

## 2018-05-19 NOTE — Telephone Encounter (Signed)
Attempted to contact patient.  Patient should have a refill at the pharmacy on diclofenac gel

## 2018-05-21 ENCOUNTER — Telehealth: Payer: Self-pay | Admitting: Family Medicine

## 2018-05-21 NOTE — Telephone Encounter (Signed)
Patient aware.

## 2018-05-21 NOTE — Telephone Encounter (Signed)
Patient states that she is using diclofenac 4 times daily and states that it is not helping but has a relief from [employer]

## 2018-05-21 NOTE — Telephone Encounter (Signed)
Great keep using OrthoGel

## 2018-05-22 ENCOUNTER — Other Ambulatory Visit: Payer: Self-pay | Admitting: Family Medicine

## 2018-05-22 NOTE — Telephone Encounter (Signed)
Call taken care of in different encounter.  This encounter will now be closed

## 2018-05-23 ENCOUNTER — Other Ambulatory Visit: Payer: Self-pay | Admitting: Family Medicine

## 2018-05-23 NOTE — Telephone Encounter (Signed)
Pt aware rx was sent over 05/06/18 and to call [facility] for refill.

## 2018-06-15 ENCOUNTER — Telehealth: Payer: Self-pay | Admitting: Family Medicine

## 2018-06-15 NOTE — Telephone Encounter (Signed)
Pt states she already spoke to someone with Social Services and they were helping her.

## 2018-07-01 ENCOUNTER — Ambulatory Visit: Payer: Medicare HMO | Admitting: Cardiology

## 2018-07-23 ENCOUNTER — Telehealth: Payer: Self-pay | Admitting: Cardiology

## 2018-07-23 NOTE — Telephone Encounter (Signed)
Virtual Visit Pre-Appointment Phone Call  "(Name), I am calling you today to discuss your upcoming appointment. We are currently trying to limit exposure to the virus that causes COVID-19 by seeing patients at home rather than in the office."  1. "What is the BEST phone number to call the day of the visit?" - include this in appointment notes  2. Do you have or have access to (through a family member/friend) a smartphone with video capability that we can use for your visit?" a. If yes - list this number in appt notes as cell (if different from BEST phone #) and list the appointment type as a VIDEO visit in appointment notes b. If no - list the appointment type as a PHONE visit in appointment notes  3. Confirm consent - "In the setting of the current Covid19 crisis, you are scheduled for a (phone or video) visit with your provider on (date) at (time).  Just as we do with many in-office visits, in order for you to participate in this visit, we must obtain consent.  If you'd like, I can send this to your mychart (if signed up) or email for you to review.  Otherwise, I can obtain your verbal consent now.  All virtual visits are billed to your insurance company just like a normal visit would be.  By agreeing to a virtual visit, we'd like you to understand that the technology does not allow for your provider to perform an examination, and thus may limit your provider's ability to fully assess your condition. If your provider identifies any concerns that need to be evaluated in person, we will make arrangements to do so.  Finally, though the technology is pretty good, we cannot assure that it will always work on either your or our end, and in the setting of a video visit, we may have to convert it to a phone-only visit.  In either situation, we cannot ensure that we have a secure connection.  Are you willing to proceed?" STAFF: Did the patient verbally acknowledge consent to telehealth visit? Document  YES/NO here: yes  4. Advise patient to be prepared - "Two hours prior to your appointment, go ahead and check your blood pressure, pulse, oxygen saturation, and your weight (if you have the equipment to check those) and write them all down. When your visit starts, your provider will ask you for this information. If you have an Apple Watch or Kardia device, please plan to have heart rate information ready on the day of your appointment. Please have a pen and paper handy nearby the day of the visit as well."  5. Give patient instructions for MyChart download to smartphone OR [email or web address] as below if video visit (depending on what platform provider is using)  6. Inform patient they will receive a phone call 15 minutes prior to their appointment time (may be from unknown caller ID) so they should be prepared to answer    TELEPHONE CALL NOTE  Tonya Carpenter has been deemed a candidate for a follow-up tele-health visit to limit community exposure during the Covid-19 pandemic. I spoke with the patient via phone to ensure availability of phone/video source, confirm preferred email & phone number, and discuss instructions and expectations.  I reminded Tonya HeaterMary W Carpenter to be prepared with any vital sign and/or heart rhythm information that could potentially be obtained via home monitoring, at the time of her visit. I reminded Tonya HeaterMary W Carpenter to expect a phone call prior to  her visit.  Geraldine Contras 07/23/2018 3:37 PM   INSTRUCTIONS FOR DOWNLOADING THE MYCHART APP TO SMARTPHONE  - The patient must first make sure to have activated MyChart and know their login information - If Apple, go to [employer] and type in MyChart in the search bar and download the app. If Android, ask patient to go to [employer] and type in [place] in the search bar and download the app. The app is free but as with any other app downloads, their phone may require them to verify saved payment information or Apple/Android password.    - The patient will need to then log into the app with their MyChart username and password, and select [facility] as their healthcare provider to link the account. When it is time for your visit, go to the MyChart app, find appointments, and click Begin Video Visit. Be sure to Select Allow for your device to access the Microphone and Camera for your visit. You will then be connected, and your provider will be with you shortly.  **If they have any issues connecting, or need assistance please contact MyChart service desk [phone])**  **If using a computer, in order to ensure the best quality for their visit they will need to use either of the following Internet Browsers: [employer] or Google Chrome**  IF USING DOXIMITY or DOXY.ME - The patient will receive a link just prior to their visit by text.     FULL LENGTH CONSENT FOR TELE-HEALTH VISIT   I hereby voluntarily request, consent and authorize [facility] and its employed or contracted physicians, physician assistants, nurse practitioners or other licensed health care professionals (the Practitioner), to provide me with telemedicine health care services (the Services") as deemed necessary by the treating Practitioner. I acknowledge and consent to receive the Services by the Practitioner via telemedicine. I understand that the telemedicine visit will involve communicating with the Practitioner through live audiovisual communication technology and the disclosure of certain medical information by electronic transmission. I acknowledge that I have been given the opportunity to request an in-person assessment or other available alternative prior to the telemedicine visit and am voluntarily participating in the telemedicine visit.  I understand that I have the right to withhold or withdraw my consent to the use of telemedicine in the course of my care at any time, without affecting my right to future care or treatment, and that  the Practitioner or I may terminate the telemedicine visit at any time. I understand that I have the right to inspect all information obtained and/or recorded in the course of the telemedicine visit and may receive copies of available information for a reasonable fee.  I understand that some of the potential risks of receiving the Services via telemedicine include:   Delay or interruption in medical evaluation due to technological equipment failure or disruption;  Information transmitted may not be sufficient (e.g. poor resolution of images) to allow for appropriate medical decision making by the Practitioner; and/or   In rare instances, security protocols could fail, causing a breach of personal health information.  Furthermore, I acknowledge that it is my responsibility to provide information about my medical history, conditions and care that is complete and accurate to the best of my ability. I acknowledge that Practitioner's advice, recommendations, and/or decision may be based on factors not within their control, such as incomplete or inaccurate data provided by me or distortions of diagnostic images or specimens that may result from electronic transmissions.  I understand that the practice of medicine is not an exact science and that Practitioner makes no warranties or guarantees regarding treatment outcomes. I acknowledge that I will receive a copy of this consent concurrently upon execution via email to the email address I last provided but may also request a printed copy by calling the office of [place].    I understand that my insurance will be billed for this visit.   I have read or had this consent read to me.  I understand the contents of this consent, which adequately explains the benefits and risks of the Services being provided via telemedicine.   I have been provided ample opportunity to ask questions regarding this consent and the Services and have had my questions answered to  my satisfaction.  I give my informed consent for the services to be provided through the use of telemedicine in my medical care  By participating in this telemedicine visit I agree to the above.

## 2018-07-27 NOTE — Progress Notes (Signed)
Virtual Visit via Telephone Note   This visit type was conducted due to national recommendations for restrictions regarding the COVID-19 Pandemic (e.g. social distancing) in an effort to limit this patient's exposure and mitigate transmission in our community.  Due to her co-morbid illnesses, this patient is at least at moderate risk for complications without adequate follow up.  This format is felt to be most appropriate for this patient at this time.  The patient did not have access to video technology/had technical difficulties with video requiring transitioning to audio format only (telephone).  All issues noted in this document were discussed and addressed.  No physical exam could be performed with this format.  Please refer to the patient's chart for her  consent to telehealth for [facility].   Date:  07/28/2018   ID:  Tonya, Carpenter Aug 05, 1945, MRN [id number]  Patient Location: Home Provider Location: Home  PCP:  [name], Elige Radon, MD  Cardiologist:  Rollene Rotunda, MD  Electrophysiologist:  None   Evaluation Performed:  Follow-Up Visit  Chief Complaint:     Chest pain  History of Present Illness:    Tonya Carpenter is a 73 y.o. female who was referred for evaluation of chest pain.  She has had cardiac work up in the past.  Echo in 2011 demonstrated a low normal EF.  There was a small pericardial effusion.   Cardiac cath in 2014 demonstrated only mild coronary plaque.    At the last visit she was having some dizziness.  Denies reduced her beta-blocker.  Since then she said she is less dizzy.  She has been under a lot of stress.  Her husband has had cancer and apparently became combative and had to be removed from home to hospice.  She is upset about this.  But she is not complaining of any new cardiovascular complaints.  Is not having any new chest pressure, neck or arm discomfort.  She is not describing the dizziness that she was having.  She can walk several blocks to the store  and she did this today and did not have any problems with it.  She is not having any new pain other than some fleeting sharp discomfort that she has had periodically.  She just feels anxious.  The patient does not have symptoms concerning for COVID-19 infection (fever, chills, cough, or new shortness of breath).    Past Medical History:  Diagnosis Date  . Anxiety   . Arthritis    "hands" (01/14/2013)  . Asthma   . Cataract   . Coronary artery disease    Non obstructive CAD 2014 cath.   . Exertional shortness of breath   . Hyperlipidemia   . Hypertension   . Stroke [facility])    "light stroke long years ago; didn't affect me permanently" (01/14/2013)  . Type 2 diabetes mellitus (HCC)    Past Surgical History:  Procedure Laterality Date  . APPENDECTOMY    . CARDIAC CATHETERIZATION  2008   no angiographically significant CAD  . CARDIOVASCULAR STRESS TEST  12/2009   no evidence for stress-induced reversibility or ischemia.  She had a fixed anterior septal wall defect, possibly related to breast attenuation and her EF was 54%.  Marland Kitchen CATARACT EXTRACTION W/PHACO Right 10/25/2014   Procedure: CATARACT EXTRACTION PHACO AND INTRAOCULAR LENS PLACEMENT; CDE:  17.28;  Surgeon: Jethro Bolus, MD;  Location: [facility];  Service: Ophthalmology;  Laterality: Right;  . CATARACT EXTRACTION W/PHACO Left 11/08/2014   Procedure: CATARACT  EXTRACTION PHACO AND INTRAOCULAR LENS PLACEMENT (IOC);  Surgeon: Jethro BolusMark Shapiro, MD;  Location: [facility];  Service: Ophthalmology;  Laterality: Left;  CDE: 5.94  . CHOLECYSTECTOMY    . DILATION AND CURETTAGE OF UTERUS    . LEFT HEART CATHETERIZATION WITH CORONARY ANGIOGRAM N/A 01/14/2013   Procedure: LEFT HEART CATHETERIZATION WITH CORONARY ANGIOGRAM;  Surgeon: Kathleene Hazelhristopher D McAlhany, MD;  [facility];  Service: Cardiovascular;  Laterality: N/A;  . TONSILLECTOMY    . TUBAL LIGATION    . VAGINAL HYSTERECTOMY       Current Meds  Medication Sig  . acetaminophen (TYLENOL)  500 MG tablet Take 1,000 mg by mouth every 6 (six) hours as needed.  . Alcohol Swabs (B-D SINGLE USE SWABS REGULAR) PADS USE TO CHECK BLOOD SUGAR TWICE DAILY AND AS NEEDED  . ASPIRIN ADULT LOW STRENGTH 81 MG EC tablet TAKE 1 TABLET EVERY DAY  . atorvastatin (LIPITOR) 20 MG tablet Take 1 tablet (20 mg total) by mouth daily.  . Blood Glucose Calibration (TRUE METRIX LEVEL 2) Normal SOLN Use as needed with BS machine  . cholecalciferol (VITAMIN D) 1000 units tablet Take 1,000 Units by mouth daily.  . diclofenac sodium (VOLTAREN) 1 % GEL Apply 2 g topically 4 (four) times daily.  Marland Kitchen. GLUCERNA (GLUCERNA) LIQD Take 237 mLs by mouth 2 (two) times daily between meals.  Marland Kitchen. glucose blood (PRODIGY NO CODING BLOOD GLUC) test strip USE  TO TEST BLOOD SUGAR TWICE DAILY  . hydrALAZINE (APRESOLINE) 10 MG tablet Take 1 tablet (10 mg total) by mouth 2 (two) times daily.  Marland Kitchen. JANUMET XR 50-500 MG TB24 Take 1 tablet by mouth 2 (two) times daily.  . Liniments (ORTHOGEL) GEL Apply 1 application topically daily as needed (pain).   Marland Kitchen. losartan-hydrochlorothiazide (HYZAAR) 50-12.5 MG tablet Take 1 tablet by mouth daily.  . metoprolol succinate (TOPROL-XL) 50 MG 24 hr tablet Take 1 tablet (50 mg total) by mouth daily. Take with or immediately following a meal.  . nitroGLYCERIN (NITROSTAT) 0.4 MG SL tablet PLACE 1 TABLET (0.4 MG TOTAL) UNDER THE TONGUE EVERY 5 (FIVE) MINUTES AS NEEDED. FOR CHEST PAIN  . omeprazole (PRILOSEC) 40 MG capsule TAKE 1 CAPSULE (40 MG TOTAL) BY MOUTH DAILY.  Marland Kitchen. polyethylene glycol powder (GLYCOLAX/MIRALAX) powder Take 17 g by mouth 2 (two) times daily as needed.  Marland Kitchen. PRODIGY TWIST TOP LANCETS 28G MISC TEST TWICE DAILY     Allergies:   Patient has no known allergies.   Social History   Tobacco Use  . Smoking status: Never Smoker  . Smokeless tobacco: Never Used  Substance Use Topics  . Alcohol use: No  . Drug use: No     Family Hx: The patient's family history includes Cancer in her sister;  Heart Problems in her son; Hypertension in her father and mother; Seizures in her son.  ROS:   Please see the history of present illness.    As stated in the HPI and negative for all other systems.   Prior CV studies:   The following studies were reviewed today:    Labs/Other Tests and Data Reviewed:    EKG:  No ECG reviewed.  Recent Labs: 01/29/2018: ALT 12; BUN 18; Creatinine, Ser 1.24; Potassium 4.3; Sodium 143   Recent Lipid Panel Lab Results  Component Value Date/Time   CHOL 132 10/15/2017 09:02 AM   CHOL 148 07/10/2012 11:47 AM   TRIG 121 10/15/2017 09:02 AM   TRIG 85 07/10/2012 11:47 AM   HDL  50 10/15/2017 09:02 AM   HDL 51 07/10/2012 11:47 AM   CHOLHDL 2.6 10/15/2017 09:02 AM   LDLCALC 58 10/15/2017 09:02 AM   LDLCALC 80 07/10/2012 11:47 AM   Lab Results  Component Value Date   HGBA1C 6.8 01/29/2018    Wt Readings from Last 3 Encounters:  07/28/18 153 lb (69.4 kg)  05/02/18 148 lb (67.1 kg)  04/22/18 150 lb (68 kg)     Objective:    Vital Signs:  BP 124/80   Ht  (1.575 m)   Wt 153 lb (69.4 kg)   BMI 27.98 kg/m    VITAL SIGNS:  reviewed  ASSESSMENT & PLAN:    CHEST PAIN:    This is atypical.  No further cardiac work up.    HTN: Blood pressure is controlled.     DYSLIPIDEMIA:    LDL is excellent.    DM:    A1C is 6.8.  Continue current meds.   LBBB:   This is chronic .  No further work up.   Her dizziness seems to have improved with the reduction in her beta-blocker.  No other change in therapy.  COVID-19 Education: The signs and symptoms of COVID-19 were discussed with the patient and how to seek care for testing (follow up with PCP or arrange E-visit).  The importance of social distancing was discussed today.  Time:   Today, I have spent 16 minutes with the patient with telehealth technology discussing the above problems.     Medication Adjustments/Labs and Tests Ordered: Current medicines are reviewed at length with the  patient today.  Concerns regarding medicines are outlined above.   Tests Ordered: No orders of the defined types were placed in this encounter.   Medication Changes: No orders of the defined types were placed in this encounter.   Disposition:  Follow up prn  Signed, Rollene Rotunda, MD  07/28/2018 2:14 PM    [facility]

## 2018-07-28 ENCOUNTER — Telehealth (INDEPENDENT_AMBULATORY_CARE_PROVIDER_SITE_OTHER): Payer: Medicare HMO | Admitting: Cardiology

## 2018-07-28 ENCOUNTER — Other Ambulatory Visit: Payer: Self-pay | Admitting: Family Medicine

## 2018-07-28 ENCOUNTER — Encounter: Payer: Self-pay | Admitting: Cardiology

## 2018-07-28 VITALS — BP 124/80 | Ht 62.0 in | Wt 153.0 lb

## 2018-07-28 DIAGNOSIS — I447 Left bundle-branch block, unspecified: Secondary | ICD-10-CM | POA: Diagnosis not present

## 2018-07-28 DIAGNOSIS — Z7189 Other specified counseling: Secondary | ICD-10-CM

## 2018-07-28 DIAGNOSIS — I10 Essential (primary) hypertension: Secondary | ICD-10-CM

## 2018-07-28 DIAGNOSIS — E785 Hyperlipidemia, unspecified: Secondary | ICD-10-CM

## 2018-07-28 DIAGNOSIS — R079 Chest pain, unspecified: Secondary | ICD-10-CM

## 2018-07-28 DIAGNOSIS — E119 Type 2 diabetes mellitus without complications: Secondary | ICD-10-CM | POA: Diagnosis not present

## 2018-07-28 DIAGNOSIS — R42 Dizziness and giddiness: Secondary | ICD-10-CM

## 2018-07-28 NOTE — Patient Instructions (Addendum)
Medication Instructions:   Your physician recommends that you continue on your current medications as directed. Please refer to the Current Medication list given to you today.  Labwork:  NONE  Testing/Procedures:  NONE  Follow-Up:  Your physician recommends that you schedule a follow-up appointment in: as needed.   Any Other Special Instructions Will Be Listed Below (If Applicable).  If you need a refill on your cardiac medications before your next appointment, please call your pharmacy. 

## 2018-07-31 ENCOUNTER — Ambulatory Visit (INDEPENDENT_AMBULATORY_CARE_PROVIDER_SITE_OTHER): Payer: Medicare HMO | Admitting: Family Medicine

## 2018-07-31 ENCOUNTER — Other Ambulatory Visit: Payer: Self-pay

## 2018-07-31 ENCOUNTER — Encounter: Payer: Self-pay | Admitting: Family Medicine

## 2018-07-31 VITALS — BP 128/70 | HR 62 | Temp 96.6°F | Ht 62.0 in | Wt 153.4 lb

## 2018-07-31 DIAGNOSIS — E785 Hyperlipidemia, unspecified: Secondary | ICD-10-CM

## 2018-07-31 DIAGNOSIS — K2100 Gastro-esophageal reflux disease with esophagitis, without bleeding: Secondary | ICD-10-CM

## 2018-07-31 DIAGNOSIS — I152 Hypertension secondary to endocrine disorders: Secondary | ICD-10-CM

## 2018-07-31 DIAGNOSIS — E1169 Type 2 diabetes mellitus with other specified complication: Secondary | ICD-10-CM

## 2018-07-31 DIAGNOSIS — N183 Chronic kidney disease, stage 3 (moderate): Secondary | ICD-10-CM | POA: Diagnosis not present

## 2018-07-31 DIAGNOSIS — E1159 Type 2 diabetes mellitus with other circulatory complications: Secondary | ICD-10-CM

## 2018-07-31 DIAGNOSIS — I10 Essential (primary) hypertension: Secondary | ICD-10-CM

## 2018-07-31 DIAGNOSIS — E1122 Type 2 diabetes mellitus with diabetic chronic kidney disease: Secondary | ICD-10-CM

## 2018-07-31 LAB — BAYER DCA HB A1C WAIVED: HB A1C (BAYER DCA - WAIVED): 7 % — ABNORMAL HIGH (ref <7.0)

## 2018-07-31 NOTE — Progress Notes (Signed)
BP 128/70   Pulse 62   Temp (!) 96.6 F (35.9 C) (Oral)   Ht 5' 2" (1.575 m)   Wt 153 lb 6.4 oz (69.6 kg)   BMI 28.06 kg/m    Subjective:   Patient ID: Tonya Carpenter, female    DOB: January 29, 1946, 73 y.o.   MRN: [id number]  HPI: Tonya Carpenter is a 73 y.o. female presenting on 07/31/2018 for Diabetes (3 month follow up)   HPI Type 2 diabetes mellitus Patient comes in today for recheck of his diabetes. Patient has been currently taking the med, most of her blood sugars are running between the 70s and 140s in the a.m and in the p.m. Patient is currently on an ACE inhibitor/ARB. Patient has not seen an ophthalmologist this year. Patient denies any issues with their feet.   Hypertension Patient is currently on hydralazine and losartan-hydrochlorothiazide and metoprolol, and their blood pressure today is 128/70. Patient denies any lightheadedness or dizziness. Patient denies headaches, blurred vision, chest pains, shortness of breath, or weakness. Denies any side effects from medication and is content with current medication.   Hyperlipidemia Patient is coming in for recheck of his hyperlipidemia. The patient is currently taking atorvastatin. They deny any issues with myalgias or history of liver damage from it. They deny any focal numbness or weakness or chest pain.   GERD Patient is currently on omeprazole.  She denies any major symptoms or abdominal pain or belching or burping. She denies any blood in her stool or lightheadedness or dizziness.   Relevant past medical, surgical, family and social history reviewed and updated as indicated. Interim medical history since our last visit reviewed. Allergies and medications reviewed and updated.  Review of Systems  Constitutional: Negative for chills and fever.  Eyes: Negative for visual disturbance.  Respiratory: Negative for chest tightness and shortness of breath.   Cardiovascular: Negative for chest pain and leg swelling.  Gastrointestinal:  Negative for abdominal pain.  Musculoskeletal: Negative for back pain and gait problem.  Skin: Negative for rash.  Neurological: Negative for light-headedness and headaches.  Psychiatric/Behavioral: Negative for agitation and behavioral problems.  All other systems reviewed and are negative.   Per HPI unless specifically indicated above   Allergies as of 07/31/2018   No Known Allergies     Medication List       Accurate as of Jul 31, 2018 10:47 AM. If you have any questions, ask your nurse or doctor.        acetaminophen 500 MG tablet Commonly known as:  TYLENOL Take 1,000 mg by mouth every 6 (six) hours as needed.   Aspirin Adult Low Strength 81 MG EC tablet Generic drug:  aspirin TAKE 1 TABLET EVERY DAY   atorvastatin 20 MG tablet Commonly known as:  LIPITOR Take 1 tablet (20 mg total) by mouth daily.   B-D SINGLE USE SWABS REGULAR Pads USE TO CHECK BLOOD SUGAR TWICE DAILY AND AS NEEDED   cholecalciferol 25 MCG (1000 UT) tablet Commonly known as:  VITAMIN D TAKE 1 TABLET EVERY DAY   diclofenac sodium 1 % Gel Commonly known as:  Voltaren Apply 2 g topically 4 (four) times daily.   Glucerna Liqd Take 237 mLs by mouth 2 (two) times daily between meals.   glucose blood test strip Commonly known as:  True Metrix Blood Glucose Test Test blood sugar twice daily and as needed Dx E11.9   hydrALAZINE 10 MG tablet Commonly known as:  APRESOLINE Take 1  tablet (10 mg total) by mouth 2 (two) times daily.   Janumet XR 50-500 MG Tb24 Generic drug:  SitaGLIPtin-MetFORMIN HCl Take 1 tablet by mouth 2 (two) times daily.   losartan-hydrochlorothiazide 50-12.5 MG tablet Commonly known as:  HYZAAR Take 1 tablet by mouth daily.   metoprolol succinate 50 MG 24 hr tablet Commonly known as:  TOPROL-XL Take 1 tablet (50 mg total) by mouth daily. Take with or immediately following a meal.   nitroGLYCERIN 0.4 MG SL tablet Commonly known as:  NITROSTAT PLACE 1 TABLET (0.4 MG  TOTAL) UNDER THE TONGUE EVERY 5 (FIVE) MINUTES AS NEEDED. FOR CHEST PAIN   omeprazole 40 MG capsule Commonly known as:  PRILOSEC TAKE 1 CAPSULE (40 MG TOTAL) BY MOUTH DAILY.   Orthogel Gel Apply 1 application topically daily as needed (pain).   polyethylene glycol powder 17 GM/SCOOP powder Commonly known as:  GLYCOLAX/MIRALAX Take 17 g by mouth 2 (two) times daily as needed.   Prodigy Twist Top Lancets 28G Misc TEST TWICE DAILY   True Metrix Level 2 Normal Soln Use as needed with BS machine        Objective:   BP 128/70   Pulse 62   Temp (!) 96.6 F (35.9 C) (Oral)   Ht 5' 2" (1.575 m)   Wt 153 lb 6.4 oz (69.6 kg)   BMI 28.06 kg/m   Wt Readings from Last 3 Encounters:  07/31/18 153 lb 6.4 oz (69.6 kg)  07/28/18 153 lb (69.4 kg)  05/02/18 148 lb (67.1 kg)    Physical Exam Vitals signs and nursing note reviewed.  Constitutional:      General: She is not in acute distress.    Appearance: She is well-developed. She is not diaphoretic.  Eyes:     Conjunctiva/sclera: Conjunctivae normal.  Cardiovascular:     Rate and Rhythm: Normal rate and regular rhythm.     Heart sounds: Normal heart sounds. No murmur.  Pulmonary:     Effort: Pulmonary effort is normal. No respiratory distress.     Breath sounds: Normal breath sounds. No wheezing.  Musculoskeletal: Normal range of motion.        General: No swelling.  Skin:    General: Skin is warm and dry.     Findings: No rash.  Neurological:     Mental Status: She is alert and oriented to person, place, and time.     Coordination: Coordination normal.  Psychiatric:        Behavior: Behavior normal.       Assessment & Plan:   Problem List Items Addressed This Visit      Cardiovascular and Mediastinum   Hypertension associated with diabetes ([place])   Relevant Orders   CMP14+EGFR     Digestive   GERD (gastroesophageal reflux disease)   Relevant Orders   CBC with Differential/Platelet     Endocrine   Type 2  diabetes mellitus ([place]) - Primary   Relevant Orders   CMP14+EGFR   Bayer DCA Hb A1c Waived   Hyperlipidemia associated with type 2 diabetes mellitus (HCC)   Relevant Orders   Lipid panel      Continue current medications, no changes and will do his lab work today and continue to monitor, patient is a little bit upset today because her husband was taken by Adult Protective Services and is now on hospice at home hospice but she seems to be doing okay and in good spirits because she still can go visit him. Follow  up plan: Return in about 3 months (around 10/31/2018), or if symptoms worsen or fail to improve, for Diabetes recheck.  Counseling provided for all of the vaccine components Orders Placed This Encounter  Procedures  . CMP14+EGFR  . Bayer DCA Hb A1c Waived  . CBC with Differential/Platelet  . Lipid panel    Caryl Pina, MD [facility] 07/31/2018, 10:47 AM

## 2018-08-01 LAB — CMP14+EGFR
ALT: 17 IU/L (ref 0–32)
AST: 20 IU/L (ref 0–40)
Albumin/Globulin Ratio: 1.3 (ref 1.2–2.2)
Albumin: 4.3 g/dL (ref 3.7–4.7)
Alkaline Phosphatase: 51 IU/L (ref 39–117)
BUN/Creatinine Ratio: 13 (ref 12–28)
BUN: 15 mg/dL (ref 8–27)
Bilirubin Total: 0.4 mg/dL (ref 0.0–1.2)
CO2: 27 mmol/L (ref 20–29)
Calcium: 10.1 mg/dL (ref 8.7–10.3)
Chloride: 103 mmol/L (ref 96–106)
Creatinine, Ser: 1.12 mg/dL — ABNORMAL HIGH (ref 0.57–1.00)
GFR calc Af Amer: 57 mL/min/{1.73_m2} — ABNORMAL LOW (ref >59)
GFR calc non Af Amer: 50 mL/min/{1.73_m2} — ABNORMAL LOW (ref >59)
Globulin, Total: 3.3 g/dL (ref 1.5–4.5)
Glucose: 144 mg/dL — ABNORMAL HIGH (ref 65–99)
Potassium: 3.9 mmol/L (ref 3.5–5.2)
Sodium: 143 mmol/L (ref 134–144)
Total Protein: 7.6 g/dL (ref 6.0–8.5)

## 2018-08-01 LAB — CBC WITH DIFFERENTIAL/PLATELET
Basophils Absolute: 0 10*3/uL (ref 0.0–0.2)
Basos: 0 %
EOS (ABSOLUTE): 0.1 10*3/uL (ref 0.0–0.4)
Eos: 1 %
Hematocrit: 38.5 % (ref 34.0–46.6)
Hemoglobin: 12.4 g/dL (ref 11.1–15.9)
Immature Grans (Abs): 0 10*3/uL (ref 0.0–0.1)
Immature Granulocytes: 0 %
Lymphocytes Absolute: 2.9 10*3/uL (ref 0.7–3.1)
Lymphs: 26 %
MCH: 27.4 pg (ref 26.6–33.0)
MCHC: 32.2 g/dL (ref 31.5–35.7)
MCV: 85 fL (ref 79–97)
Monocytes Absolute: 0.7 10*3/uL (ref 0.1–0.9)
Monocytes: 7 %
Neutrophils Absolute: 7.2 10*3/uL — ABNORMAL HIGH (ref 1.4–7.0)
Neutrophils: 66 %
Platelets: 237 10*3/uL (ref 150–450)
RBC: 4.52 x10E6/uL (ref 3.77–5.28)
RDW: 12 % (ref 11.7–15.4)
WBC: 10.9 10*3/uL — ABNORMAL HIGH (ref 3.4–10.8)

## 2018-08-01 LAB — LIPID PANEL
Chol/HDL Ratio: 2.6 ratio (ref 0.0–4.4)
Cholesterol, Total: 180 mg/dL (ref 100–199)
HDL: 69 mg/dL (ref >39)
LDL Calculated: 91 mg/dL (ref 0–99)
Triglycerides: 100 mg/dL (ref 0–149)
VLDL Cholesterol Cal: 20 mg/dL (ref 5–40)

## 2018-08-06 ENCOUNTER — Other Ambulatory Visit: Payer: Self-pay | Admitting: Family Medicine

## 2018-08-26 ENCOUNTER — Other Ambulatory Visit: Payer: Self-pay | Admitting: Family Medicine

## 2018-09-24 DIAGNOSIS — H2513 Age-related nuclear cataract, bilateral: Secondary | ICD-10-CM | POA: Diagnosis not present

## 2018-09-24 DIAGNOSIS — H40033 Anatomical narrow angle, bilateral: Secondary | ICD-10-CM | POA: Diagnosis not present

## 2018-09-28 ENCOUNTER — Encounter: Payer: Self-pay | Admitting: Family Medicine

## 2018-09-28 ENCOUNTER — Ambulatory Visit (INDEPENDENT_AMBULATORY_CARE_PROVIDER_SITE_OTHER): Payer: Medicare HMO | Admitting: Family Medicine

## 2018-09-28 ENCOUNTER — Other Ambulatory Visit: Payer: Self-pay

## 2018-09-28 VITALS — BP 114/59 | HR 74 | Temp 97.4°F | Ht 62.0 in | Wt 153.4 lb

## 2018-09-28 DIAGNOSIS — M5431 Sciatica, right side: Secondary | ICD-10-CM | POA: Diagnosis not present

## 2018-09-28 NOTE — Progress Notes (Signed)
BP (!) 114/59   Pulse 74   Temp (!) 97.4 F (36.3 C) (Oral)   Ht 5\' 2"  (1.575 m)   Wt 153 lb 6.4 oz (69.6 kg)   BMI 28.06 kg/m    Subjective:   Patient ID: Tonya Carpenter, female    DOB: Nov 10, 1945, 73 y.o.   MRN: [id number]  HPI: Tonya Carpenter is a 73 y.o. female presenting on 09/28/2018 for Back Pain (Right lower back that goes down leg x 2-3 days)   HPI Right lower back pain that goes down the right leg is what patient is coming in for today.  She says is been bothering her over the past 2 to 3 days and she says is been especially an issue because she cannot help her husband who is fighting cancer and is on hospice at home with her and she cannot move around as much and she feels like she might of strained something.  Patient denies any numbness or weakness in that leg.  Relevant past medical, surgical, family and social history reviewed and updated as indicated. Interim medical history since our last visit reviewed. Allergies and medications reviewed and updated.  Review of Systems  Constitutional: Negative for chills and fever.  Eyes: Negative for visual disturbance.  Respiratory: Negative for chest tightness and shortness of breath.   Cardiovascular: Negative for chest pain and leg swelling.  Musculoskeletal: Positive for back pain and myalgias. Negative for gait problem.  Skin: Negative for rash.  Neurological: Negative for light-headedness and headaches.  Psychiatric/Behavioral: Negative for agitation and behavioral problems.  All other systems reviewed and are negative.   Per HPI unless specifically indicated above   Allergies as of 09/28/2018   No Known Allergies     Medication List       Accurate as of September 28, 2018 11:59 PM. If you have any questions, ask your nurse or doctor.        acetaminophen 500 MG tablet Commonly known as: TYLENOL Take 1,000 mg by mouth every 6 (six) hours as needed.   Aspirin Adult Low Strength 81 MG EC tablet Generic drug: aspirin  TAKE 1 TABLET EVERY DAY   atorvastatin 20 MG tablet Commonly known as: LIPITOR Take 1 tablet (20 mg total) by mouth daily.   B-D SINGLE USE SWABS REGULAR Pads USE TO CHECK BLOOD SUGAR TWICE DAILY AND AS NEEDED   cholecalciferol 25 MCG (1000 UT) tablet Commonly known as: VITAMIN D TAKE 1 TABLET EVERY DAY   diclofenac sodium 1 % Gel Commonly known as: VOLTAREN APPLY 2 GRAMS TOPICALLY 4 TIMES DAILY.   Glucerna Liqd Take 237 mLs by mouth 2 (two) times daily between meals.   hydrALAZINE 10 MG tablet Commonly known as: APRESOLINE Take 1 tablet (10 mg total) by mouth 2 (two) times daily.   Janumet XR 50-500 MG Tb24 Generic drug: SitaGLIPtin-MetFORMIN HCl Take 1 tablet by mouth 2 (two) times daily.   losartan-hydrochlorothiazide 50-12.5 MG tablet Commonly known as: HYZAAR Take 1 tablet by mouth daily.   metoprolol succinate 50 MG 24 hr tablet Commonly known as: TOPROL-XL Take 1 tablet (50 mg total) by mouth daily. Take with or immediately following a meal.   nitroGLYCERIN 0.4 MG SL tablet Commonly known as: NITROSTAT PLACE 1 TABLET (0.4 MG TOTAL) UNDER THE TONGUE EVERY 5 (FIVE) MINUTES AS NEEDED. FOR CHEST PAIN   omeprazole 40 MG capsule Commonly known as: PRILOSEC TAKE 1 CAPSULE (40 MG TOTAL) BY MOUTH DAILY.   Orthogel Gel  Apply 1 application topically daily as needed (pain).   polyethylene glycol powder 17 GM/SCOOP powder Commonly known as: GLYCOLAX/MIRALAX Take 17 g by mouth 2 (two) times daily as needed.   Prodigy Twist Top Lancets 28G Misc TEST TWICE DAILY   True Metrix Blood Glucose Test test strip Generic drug: glucose blood Test blood sugar twice daily and as needed Dx E11.9   True Metrix Level 2 Normal Soln Use as needed with BS machine        Objective:   BP (!) 114/59   Pulse 74   Temp (!) 97.4 F (36.3 C) (Oral)   Ht 5\' 2"  (1.575 m)   Wt 153 lb 6.4 oz (69.6 kg)   BMI 28.06 kg/m   Wt Readings from Last 3 Encounters:  09/28/18 153 lb 6.4  oz (69.6 kg)  07/31/18 153 lb 6.4 oz (69.6 kg)  07/28/18 153 lb (69.4 kg)    Physical Exam Vitals signs and nursing note reviewed.  Constitutional:      General: She is not in acute distress.    Appearance: She is well-developed. She is not diaphoretic.  Eyes:     Conjunctiva/sclera: Conjunctivae normal.  Musculoskeletal: Normal range of motion.     Lumbar back: She exhibits tenderness. She exhibits normal range of motion, no bony tenderness, no swelling, no spasm and normal pulse.       Back:  Neurological:     Mental Status: She is alert and oriented to person, place, and time.     Coordination: Coordination normal.  Psychiatric:        Behavior: Behavior normal.       Assessment & Plan:   Problem List Items Addressed This Visit    None    Visit Diagnoses    Right sciatic nerve pain    -  Primary      Discussed doing injection but patient has topical Voltaren and would like to continue with that she just wanted know for sure that she could take that. Follow up plan: Return if symptoms worsen or fail to improve.  Counseling provided for all of the vaccine components No orders of the defined types were placed in this encounter.   Arville CareJoshua [name], MD [facility] 10/04/2018, 9:57 PM

## 2018-10-01 ENCOUNTER — Telehealth: Payer: Self-pay | Admitting: Family Medicine

## 2018-10-01 NOTE — Chronic Care Management (AMB) (Signed)
°  Chronic Care Management   Outreach Note  10/01/2018 Name: Tonya Carpenter MRN: [id number] DOB: 02/07/1946  Referred by: [name], Fransisca Kaufmann, MD Reason for referral : Chronic Care Management (Initial CCM outreach was unsuccessful. )   An unsuccessful telephone outreach was attempted today. The patient was referred to the case management team by for assistance with chronic care management and care coordination.   Follow Up Plan: The care management team will reach out to the patient again over the next 7 days.   [place]  [email or web address]   ??[phone]

## 2018-10-06 ENCOUNTER — Encounter: Payer: Self-pay | Admitting: Gastroenterology

## 2018-10-06 ENCOUNTER — Other Ambulatory Visit: Payer: Self-pay | Admitting: Family Medicine

## 2018-10-06 DIAGNOSIS — E1122 Type 2 diabetes mellitus with diabetic chronic kidney disease: Secondary | ICD-10-CM

## 2018-10-07 ENCOUNTER — Ambulatory Visit: Payer: Medicare HMO | Admitting: Licensed Clinical Social Worker

## 2018-10-07 DIAGNOSIS — I152 Hypertension secondary to endocrine disorders: Secondary | ICD-10-CM

## 2018-10-07 DIAGNOSIS — E1169 Type 2 diabetes mellitus with other specified complication: Secondary | ICD-10-CM

## 2018-10-07 DIAGNOSIS — E1122 Type 2 diabetes mellitus with diabetic chronic kidney disease: Secondary | ICD-10-CM

## 2018-10-07 DIAGNOSIS — R42 Dizziness and giddiness: Secondary | ICD-10-CM

## 2018-10-07 DIAGNOSIS — K2100 Gastro-esophageal reflux disease with esophagitis, without bleeding: Secondary | ICD-10-CM

## 2018-10-07 DIAGNOSIS — E1159 Type 2 diabetes mellitus with other circulatory complications: Secondary | ICD-10-CM

## 2018-10-07 NOTE — Chronic Care Management (AMB) (Signed)
Chronic Care Management   Note  10/07/2018 Name: Tonya Carpenter MRN: [id number] DOB: [birth year]  Tonya Carpenter is a 73 y.o. year old female who is a primary care patient of [name], Fransisca Kaufmann, MD. I reached out to [place] by phone today in response to a referral sent by Tonya Carpenter's health plan.    Tonya Carpenter was given information about Chronic Care Management services today including:  1. CCM service includes personalized support from designated clinical staff supervised by her physician, including individualized plan of care and coordination with other care providers 2. 24/7 contact phone numbers for assistance for urgent and routine care needs. 3. Service will only be billed when office clinical staff spend 20 minutes or more in a month to coordinate care. 4. Only one practitioner may furnish and bill the service in a calendar month. 5. The patient may stop CCM services at any time (effective at the end of the month) by phone call to the office staff. 6. The patient will be responsible for cost sharing (co-pay) of up to 20% of the service fee (after annual deductible is met).  Patient agreed to services and verbal consent obtained.   Follow up plan: Telephone appointment with CCM team member scheduled for: 10/07/2018  [place]  [email or web address]   ??[phone]

## 2018-10-07 NOTE — Patient Instructions (Addendum)
Licensed Clinical Social Worker Visit Information  Goals we discussed today:  Goals Addressed            This Visit's Progress   . Client wants to talk with LCSW about managing stress issues faced (pt-stated)       Current Barriers:  . Financial challenges . Transport challenge  Clinical Social Work Clinical Goal(s):  . Client to talk with LCSW in next 30 days about client managing stress issues  Interventions: . Talked with client about CCM services . Talked with client about support with [facility] for nursing needs . Talked about Food Stamps benefit . Talked about [place] support  Patient Self Care Activities:  . Takes medications as prescribed . Attends scheduled medical appointments  Plan: Client to attend medical appointments LCSW to call client in next 3 weeks to discuss psychosocial needs of client Client to talk with [place] representative about resources through that Center     Initial goal documentation         Materials Provided: No  Follow Up Plan:  LCSW to call client in next 3 weeks to discuss psychosocial needs of client  The patient verbalized understanding of instructions provided today and declined a print copy of patient instruction materials.   [name].Alecsander Hattabaugh MSW, LCSW  Licensed Clinical Social Worker [facility] Care Management [phone]

## 2018-10-07 NOTE — Chronic Care Management (AMB) (Signed)
  Care Management Note   Tonya Carpenter is a 73 y.o. year old female who is a primary care patient of [name], Fransisca Kaufmann, MD. The CM team was consulted for assistance with chronic disease management and care coordination.   I reached out to [place] by phone today.   Review of patient status, including review of consultants reports, relevant laboratory and other test results, and collaboration with appropriate care team members and the patient's provider was performed as part of comprehensive patient evaluation and provision of chronic care management services.   Social determinants of Health: Financial challenge; Stress issues    Chronic Care Management from 10/07/2018 in [place]  PHQ-9 Total Score  6     GAD 7 : Generalized Anxiety Score 10/07/2018  Nervous, Anxious, on Edge 1  Control/stop worrying 1  Worry too much - different things 0  Trouble relaxing 1  Restless 0  Easily annoyed or irritable 0  Afraid - awful might happen 0  Total GAD 7 Score 3  Anxiety Difficulty Somewhat difficult    Goals Addressed            This Visit's Progress   . Client wants to talk with LCSW about managing stress issues faced (pt-stated)       Current Barriers:  . Financial challenges . Transport challenge  Clinical Social Work Clinical Goal(s):  . Client to talk with LCSW in next 30 days about client managing stress issues  Interventions: . Talked with client about CCM services . Talked with client about support with [facility] for nursing needs . Talked about Food Stamps benefit . Talked about [place] support  Patient Self Care Activities:  . Takes medications as prescribed . Attends scheduled medical appointments  Plan: Client to attend medical appointments LCSW to call client in next 3 weeks to discuss psychosocial needs of client Client to talk with [place] representative about resources through that Center  Initial goal  documentation       Client said her husband is receiving care at [facility] [place].  She has financial challenges She has 2 sons.  Client has Food Stamps benefit. She spoke of stress issues faced. LCSW talked with client about RNCM nursing support with CCM program.  Follow Up Plan: LCSW to call client in next 3 weeks to talk about psychosocial needs of client  [name].Honora Searson MSW, LCSW Licensed Clinical Social Worker [facility] Care [facility] [phone]

## 2018-10-08 DIAGNOSIS — H26493 Other secondary cataract, bilateral: Secondary | ICD-10-CM | POA: Diagnosis not present

## 2018-10-15 ENCOUNTER — Ambulatory Visit: Payer: Medicare HMO | Admitting: Licensed Clinical Social Worker

## 2018-10-15 ENCOUNTER — Other Ambulatory Visit: Payer: Self-pay | Admitting: Family Medicine

## 2018-10-15 DIAGNOSIS — E1122 Type 2 diabetes mellitus with diabetic chronic kidney disease: Secondary | ICD-10-CM

## 2018-10-15 DIAGNOSIS — N183 Chronic kidney disease, stage 3 unspecified: Secondary | ICD-10-CM

## 2018-10-15 DIAGNOSIS — R42 Dizziness and giddiness: Secondary | ICD-10-CM

## 2018-10-15 DIAGNOSIS — E1169 Type 2 diabetes mellitus with other specified complication: Secondary | ICD-10-CM

## 2018-10-15 DIAGNOSIS — K2100 Gastro-esophageal reflux disease with esophagitis, without bleeding: Secondary | ICD-10-CM

## 2018-10-15 DIAGNOSIS — E1159 Type 2 diabetes mellitus with other circulatory complications: Secondary | ICD-10-CM

## 2018-10-15 NOTE — Chronic Care Management (AMB) (Signed)
  Care Management Note   Tonya Carpenter is a 73 y.o. year old female who is a primary care patient of [name], Fransisca Kaufmann, MD. The CM team was consulted for assistance with chronic disease management and care coordination.   I reached out to [place] by phone today.   Review of patient status, including review of consultants reports, relevant laboratory and other test results, and collaboration with appropriate care team members and the patient's provider was performed as part of comprehensive patient evaluation and provision of chronic care management services.   Social Determinants of Health: risk for Depression; risk for social isolation; financial strain    Chronic Care Management from 10/07/2018 in [place]  PHQ-9 Total Score  6     GAD 7 : Generalized Anxiety Score 10/07/2018  Nervous, Anxious, on Edge 1  Control/stop worrying 1  Worry too much - different things 0  Trouble relaxing 1  Restless 0  Easily annoyed or irritable 0  Afraid - awful might happen 0  Total GAD 7 Score 3  Anxiety Difficulty Somewhat difficult   Goals    . Client wants to talk with LCSW about managing stress issues faced (pt-stated)     Current Barriers:  . Financial challenges . Transport challenge  Clinical Social Work Clinical Goal(s):  . Client to talk with LCSW in next 30 days about client managing stress issues  Interventions: . Talked with client about CCM services . Talked with client about support with [facility] for nursing needs . Talked about Food Stamps benefit . Talked about [place] support . Talked with client about transport needs of client  Patient Self Care Activities:  . Takes medications as prescribed . Attends scheduled medical appointments  Plan: Client to attend medical appointments LCSW to call client in next 3 weeks to discuss psychosocial needs of client Client to talk with [facility] about resources  Initial goal  documentation    Client said her husband is receiving care at [facility].  She has financial challenges She has 2 sons.  Client has Food Stamps benefit. She spoke of stress issues faced. LCSW talked with client about [facility] nursing support with CCM program. Client said she has adequate food supply. She is having difficulty sleeping. She is taking medications as prescribed. She said she has reduced energy some times. Client has support from her sons.  She said she had talked with representative from [facility] recently. Client spoke of visiting her husband at [facility] of [place], [place]. Client is scheduled to see cardiologist on August 26,2020 in [place], [place] talked with client about transportation needs of client  Follow Up Plan:  LCSW to call client in next 3 weeks to talk with client about psychosocial needs of client.  [name].Jenefer Woerner MSW, LCSW Licensed Clinical Social Worker [facility] Care [facility] [phone]

## 2018-10-15 NOTE — Patient Instructions (Signed)
Licensed Clinical Social Worker Visit Information  Goals we discussed today:  Goals    . Client wants to talk with LCSW about mannaging streess issues faced (pt-stated)     Current Barriers:  . Financial challenges . Transport challenge  Clinical Social Work Clinical Goal(s):  . Client to talk with LCSW in next 30 days about client managing stress issues  Interventions: . Talked with client about CCM services . Talked with client about support with [facility] for nursing needs . Talked about Food Stamps benefit . Talked about [place] support . Talked with client about transportation needs of client  Patient Self Care Activities:  . Takes medications as prescribed . Attends scheduled medical appointments  Plan: Client to attend medical appointments LCSW to call client in next 3 weeks to discuss psychosocial needs of client Client to talk with [facility] about resources   Initial goal documentation     .      Materials Provided: No  Follow Up Plan: LCSW to call client in next 3 weeks to discuss psychosocial needs of client  The patient verbalized understanding of instructions provided today and declined a print copy of patient instruction materials.   [name].Yvett Rossel MSW, LCSW Licensed Clinical Social Worker [facility] Care [facility] [phone]

## 2018-10-16 MED ORDER — JANUMET XR 50-500 MG PO TB24: 1.0000 | ORAL_TABLET | Freq: Two times a day (BID) | ORAL | 0 refills | Status: DC

## 2018-10-16 NOTE — Telephone Encounter (Signed)
Pt aware refill sent 

## 2018-10-21 ENCOUNTER — Ambulatory Visit: Payer: Medicare HMO

## 2018-11-02 ENCOUNTER — Other Ambulatory Visit: Payer: Self-pay

## 2018-11-03 ENCOUNTER — Telehealth: Payer: Self-pay

## 2018-11-03 ENCOUNTER — Ambulatory Visit: Payer: Medicare HMO | Admitting: Family Medicine

## 2018-11-03 NOTE — Telephone Encounter (Signed)
Letter ready, son will pick up.  Patient aware

## 2018-11-03 NOTE — Telephone Encounter (Signed)
Yes I am fine with writing a letter for her

## 2018-11-03 NOTE — Telephone Encounter (Signed)
Patient would like to know if Dr. Warrick Parisian can write a note stating that her son Rod Can needs to stay with her until Dec 31st.  They are wanting to give it to the landlord.  Patient states that she is not able to get a CNA and her son helps her get around the house and helps her around the house.  Please advise

## 2018-11-05 ENCOUNTER — Ambulatory Visit (INDEPENDENT_AMBULATORY_CARE_PROVIDER_SITE_OTHER): Payer: Medicare HMO | Admitting: Licensed Clinical Social Worker

## 2018-11-05 DIAGNOSIS — E1159 Type 2 diabetes mellitus with other circulatory complications: Secondary | ICD-10-CM

## 2018-11-05 DIAGNOSIS — K2100 Gastro-esophageal reflux disease with esophagitis, without bleeding: Secondary | ICD-10-CM

## 2018-11-05 DIAGNOSIS — E1122 Type 2 diabetes mellitus with diabetic chronic kidney disease: Secondary | ICD-10-CM

## 2018-11-05 DIAGNOSIS — I10 Essential (primary) hypertension: Secondary | ICD-10-CM

## 2018-11-05 DIAGNOSIS — E785 Hyperlipidemia, unspecified: Secondary | ICD-10-CM | POA: Diagnosis not present

## 2018-11-05 DIAGNOSIS — R42 Dizziness and giddiness: Secondary | ICD-10-CM

## 2018-11-05 DIAGNOSIS — N183 Chronic kidney disease, stage 3 unspecified: Secondary | ICD-10-CM

## 2018-11-05 DIAGNOSIS — I152 Hypertension secondary to endocrine disorders: Secondary | ICD-10-CM

## 2018-11-05 DIAGNOSIS — E1169 Type 2 diabetes mellitus with other specified complication: Secondary | ICD-10-CM | POA: Diagnosis not present

## 2018-11-05 NOTE — Chronic Care Management (AMB) (Signed)
Care Management Note   Tonya Carpenter is a 73 y.o. year old female who is a primary care patient of [name], Fransisca Kaufmann, MD. The CM team was consulted for assistance with chronic disease management and care coordination.   I reached out to [place] by phone today.   Review of patient status, including review of consultants reports, relevant laboratory and other test results, and collaboration with appropriate care team members and the patient's provider was performed as part of comprehensive patient evaluation and provision of chronic care management services.   Social Determinants of Health:risk of depression; risk of social isolation    Chronic Care Management from 10/07/2018 in [place]  PHQ-9 Total Score  6     GAD 7 : Generalized Anxiety Score 10/07/2018  Nervous, Anxious, on Edge 1  Control/stop worrying 1  Worry too much - different things 0  Trouble relaxing 1  Restless 0  Easily annoyed or irritable 0  Afraid - awful might happen 0  Total GAD 7 Score 3  Anxiety Difficulty Somewhat difficult   Medications    acetaminophen (TYLENOL) 500 MG tablet    Alcohol Swabs (B-D SINGLE USE SWABS REGULAR) PADS    ASPIRIN ADULT LOW STRENGTH 81 MG EC tablet    atorvastatin (LIPITOR) 20 MG tablet    Blood Glucose Calibration (TRUE METRIX LEVEL 2) Normal SOLN    cholecalciferol (VITAMIN D) 25 MCG (1000 UT) tablet    diclofenac sodium (VOLTAREN) 1 % GEL    GLUCERNA (GLUCERNA) LIQD    glucose blood (TRUE METRIX BLOOD GLUCOSE TEST) test strip    hydrALAZINE (APRESOLINE) 10 MG tablet    JANUMET XR 50-500 MG TB24    Liniments (ORTHOGEL) GEL    losartan-hydrochlorothiazide (HYZAAR) 50-12.5 MG tablet    metoprolol succinate (TOPROL-XL) 50 MG 24 hr tablet(Expired)    nitroGLYCERIN (NITROSTAT) 0.4 MG SL tablet    omeprazole (PRILOSEC) 40 MG capsule    polyethylene glycol powder (GLYCOLAX/MIRALAX) powder    PRODIGY TWIST TOP LANCETS 28G MISC   Mark as Reviewed    Goals    . Client wants to talk with LCSW about mannaging streess issues faced (pt-stated)     Current Barriers:  . Financial challenges . Transport challenge  Clinical Social Work Clinical Goal(s):  . Client to talk with LCSW in next 30 days about client managing stress issues  Interventions: . Talked with client about CCM services . Talked with client about support with [facility] for nursing needs . Talked about Food Stamps benefit . Provided counseling support for client (spouse of client passed away on 10/23/18)  Patient Self Care Activities:  . Takes medications as prescribed . Attends scheduled medical appointments  Plan: Client to attend medical appointments LCSW to call client in next 3 weeks to discuss psychosocial needs of client Client to talk with [facility] about resources   Initial goal documentation     Client said her husband passed away on 10/23/18.  Client is grieving loss of her spouse. LCSW provided counseling support for client. She has financial challenges. She has 2 sons. Client has Food Stamps benefit. She spoke of stress issues faced.Client said she has been able to talk with family members about death of her spouse.and taht talking with family members in this way has been helpful to her. CSW talked with client about [facility] nursing support with CCM program. Client said she has adequate food supply. She is having difficulty sleeping.  She is taking medications as prescribed. She said she has reduced energy some times. Client has support from her sons.  She said previously that she had talked with representative from [facility] recently.  Client is scheduled to see cardiologist [place] LCSW encouraged Tonya DandyMary to call RNCM to discuss nursing needs of client  Follow Up Plan: LCSW to call client in next 3 weeks to discuss psychosocial needs of client  Kelton PillarMichael CarpenterJenicka Coxe MSW, LCSW Licensed Clinical Social Worker  [facility] [phone]

## 2018-11-05 NOTE — Patient Instructions (Signed)
Licensed Clinical Social Worker Visit Information  Goals we discussed today:  Goals    . Client wants to talk with LCSW about mannaging streess issues faced (pt-stated)     Current Barriers:  . Financial challenges . Transport challenge  Clinical Social Work Clinical Goal(s):  . Client to talk with LCSW in next 30 days about client managing stress issues  Interventions: . Talked with client about CCM services . Talked with client about support with [facility] for nursing needs . Talked about Food Stamps benefit . Provided counseling support for client (spouse of client passed away on 2018/11/05)  Patient Self Care Activities:  . Takes medications as prescribed . Attends scheduled medical appointments  Plan: Client to attend medical appointments LCSW to call client in next 3 weeks to discuss psychosocial needs of client Client to talk with [facility] about resources   Initial goal documentation     .    Materials Provided: No  Follow Up Plan: LCSW to call client in next 3 weeks to talk about the psychosocial  needs of client  The patient verbalized understanding of instructions provided today and declined a print copy of patient instruction materials.   [name].Tere Mcconaughey MSW, LCSW Licensed Clinical Social Worker [facility] Care [facility] [phone]

## 2018-11-10 NOTE — Progress Notes (Signed)
Cardiology Office Note   Date:  11/11/2018   ID:  Tonya HeckMary W Carpenter, DOB 08/06/1945, MRN [id number]  PCP:  [name], Elige RadonJoshua A, MD  Cardiologist:   Rollene RotundaJames Sham Alviar, MD Referring:  [name], Elige RadonJoshua A, MD  Chief Complaint  Patient presents with  . Chest Pain      History of Present Illness: Tonya SimmeringMary W Carpenter is a 73 y.o. female who is referred by [name], Elige RadonJoshua A, MD for evaluation of chest pain.  She has had cardiac work up in the past. Echo in 2011 demonstrated a low normal EF. There was a small pericardial effusion. Cardiac cath in 2014 demonstrated only mild coronary plaque.  Since I last saw her her husband died.  She is not having any acute cardiovascular complaints.  She had a leg cramp in the office today which she gets periodically.  However, she says she is not having any chest pressure, neck or arm discomfort.  Not having any new palpitations, presyncope or syncope.  She has some chronic mild shortness of breath.   Past Medical History:  Diagnosis Date  . Anxiety   . Arthritis    "hands" (01/14/2013)  . Asthma   . Cataract   . Coronary artery disease    Non obstructive CAD 2014 cath.   . Exertional shortness of breath   . Hyperlipidemia   . Hypertension   . Stroke [facility](HCC)    "light stroke long years ago; didn't affect me permanently" (01/14/2013)  . Type 2 diabetes mellitus (HCC)     Past Surgical History:  Procedure Laterality Date  . APPENDECTOMY    . CARDIAC CATHETERIZATION  2008   no angiographically significant CAD  . CARDIOVASCULAR STRESS TEST  12/2009   no evidence for stress-induced reversibility or ischemia.  She had a fixed anterior septal wall defect, possibly related to breast attenuation and her EF was 54%.  Marland Kitchen. CATARACT EXTRACTION W/PHACO Right 10/25/2014   Procedure: CATARACT EXTRACTION PHACO AND INTRAOCULAR LENS PLACEMENT; CDE:  17.28;  Surgeon: Jethro BolusMark Shapiro, MD;  Location: [facility];  Service: Ophthalmology;  Laterality: Right;  . CATARACT EXTRACTION  W/PHACO Left 11/08/2014   Procedure: CATARACT EXTRACTION PHACO AND INTRAOCULAR LENS PLACEMENT (IOC);  Surgeon: Jethro BolusMark Shapiro, MD;  Location: [facility];  Service: Ophthalmology;  Laterality: Left;  CDE: 5.94  . CHOLECYSTECTOMY    . DILATION AND CURETTAGE OF UTERUS    . LEFT HEART CATHETERIZATION WITH CORONARY ANGIOGRAM N/A 01/14/2013   Procedure: LEFT HEART CATHETERIZATION WITH CORONARY ANGIOGRAM;  Surgeon: Kathleene Hazelhristopher D McAlhany, MD;  [facility];  Service: Cardiovascular;  Laterality: N/A;  . TONSILLECTOMY    . TUBAL LIGATION    . VAGINAL HYSTERECTOMY       Current Outpatient Medications  Medication Sig Dispense Refill  . acetaminophen (TYLENOL) 500 MG tablet Take 1,000 mg by mouth every 6 (six) hours as needed.    . ASPIRIN ADULT LOW STRENGTH 81 MG EC tablet TAKE 1 TABLET EVERY DAY 90 tablet 0  . cholecalciferol (VITAMIN D) 25 MCG (1000 UT) tablet TAKE 1 TABLET EVERY DAY 90 tablet 3  . diclofenac sodium (VOLTAREN) 1 % GEL APPLY 2 GRAMS TOPICALLY 4 TIMES DAILY. 100 g 0  . GLUCERNA (GLUCERNA) LIQD Take 237 mLs by mouth 2 (two) times daily between meals. 180000 mL 2  . hydrALAZINE (APRESOLINE) 10 MG tablet Take 1 tablet (10 mg total) by mouth 2 (two) times daily. 180 tablet 3  . JANUMET XR 50-500 MG TB24 Take 1 tablet  by mouth 2 (two) times daily. 60 tablet 0  . Liniments (ORTHOGEL) GEL Apply 1 application topically daily as needed (pain).     Marland Kitchen losartan-hydrochlorothiazide (HYZAAR) 50-12.5 MG tablet Take 1 tablet by mouth daily. 90 tablet 3  . metoprolol succinate (TOPROL-XL) 50 MG 24 hr tablet Take 1 tablet (50 mg total) by mouth daily. Take with or immediately following a meal. 90 tablet 3  . nitroGLYCERIN (NITROSTAT) 0.4 MG SL tablet PLACE 1 TABLET (0.4 MG TOTAL) UNDER THE TONGUE EVERY 5 (FIVE) MINUTES AS NEEDED. FOR CHEST PAIN 25 tablet 0  . omeprazole (PRILOSEC) 40 MG capsule TAKE 1 CAPSULE (40 MG TOTAL) BY MOUTH DAILY. 90 capsule 0  . polyethylene glycol powder (GLYCOLAX/MIRALAX)  powder Take 17 g by mouth 2 (two) times daily as needed. 3350 g 1  . Alcohol Swabs (B-D SINGLE USE SWABS REGULAR) PADS USE TO CHECK BLOOD SUGAR TWICE DAILY AND AS NEEDED 300 each 2  . atorvastatin (LIPITOR) 20 MG tablet Take 1 tablet (20 mg total) by mouth daily. 90 tablet 3  . Blood Glucose Calibration (TRUE METRIX LEVEL 2) Normal SOLN Use as needed with BS machine 1 each 3  . glucose blood (TRUE METRIX BLOOD GLUCOSE TEST) test strip Test blood sugar twice daily and as needed Dx E11.9 200 each 3  . PRODIGY TWIST TOP LANCETS 28G MISC TEST TWICE DAILY 200 each 2   No current facility-administered medications for this visit.     Allergies:   Patient has no known allergies.    ROS:  Please see the history of present illness.   Otherwise, review of systems are positive for none.   All other systems are reviewed and negative.    PHYSICAL EXAM: VS:  BP 120/60   Pulse 63   Wt 153 lb (69.4 kg)   BMI 27.98 kg/m  , BMI Body mass index is 27.98 kg/m. GENERAL:  Well appearing NECK:  No jugular venous distention, waveform within normal limits, carotid upstroke brisk and symmetric, no bruits, no thyromegaly LUNGS:  Clear to auscultation bilaterally CHEST:  Unremarkable HEART:  PMI not displaced or sustained,S1 and S2 within normal limits, no S3, no S4, no clicks, no rubs, no murmurs ABD:  Flat, positive bowel sounds normal in frequency in pitch, no bruits, no rebound, no guarding, no midline pulsatile mass, no hepatomegaly, no splenomegaly EXT:  2 plus pulses throughout, no edema, no cyanosis no clubbing   EKG:  EKG is  ordered today. Sinus rhythm, rate 63, left bundle branch block, no acute ST-T wave changes.  Recent Labs: 07/31/2018: ALT 17; BUN 15; Creatinine, Ser 1.12; Hemoglobin 12.4; Platelets 237; Potassium 3.9; Sodium 143    Lipid Panel    Component Value Date/Time   CHOL 180 07/31/2018 1251   CHOL 148 07/10/2012 1147   TRIG 100 07/31/2018 1251   TRIG 85 07/10/2012 1147   HDL  69 07/31/2018 1251   HDL 51 07/10/2012 1147   CHOLHDL 2.6 07/31/2018 1251   LDLCALC 91 07/31/2018 1251   LDLCALC 80 07/10/2012 1147      Wt Readings from Last 3 Encounters:  11/11/18 153 lb (69.4 kg)  09/28/18 153 lb 6.4 oz (69.6 kg)  07/31/18 153 lb 6.4 oz (69.6 kg)      Other studies Reviewed: Additional studies/ records that were reviewed today include: Labs. Review of the above records demonstrates:  Please see elsewhere in the note.     ASSESSMENT AND PLAN:  CHEST PAIN:   She  is not having any further chest discomfort.  No further cardiac work-up is suggested.   HTN: Blood pressure is controlled.  She will continue the meds as listed.   DYSLIPIDEMIA: LDL was 91 with an HDL of 69.  No change in therapy.  This is a reasonable ratio.   DM: A1c was 7.0.  No change in therapy.  LBBB:   This is chronic.    Current medicines are reviewed at length with the patient today.  The patient does not have concerns regarding medicines.  The following changes have been made:   None  Labs/ tests ordered today include:   None  Orders Placed This Encounter  Procedures  . EKG 12-Lead     Disposition:   FU with me as needed   Signed, Minus Breeding, MD  11/11/2018 4:32 PM    [facility]

## 2018-11-11 ENCOUNTER — Ambulatory Visit (INDEPENDENT_AMBULATORY_CARE_PROVIDER_SITE_OTHER): Payer: Medicare HMO | Admitting: Cardiology

## 2018-11-11 ENCOUNTER — Other Ambulatory Visit: Payer: Self-pay

## 2018-11-11 ENCOUNTER — Encounter: Payer: Self-pay | Admitting: Cardiology

## 2018-11-11 VITALS — BP 120/60 | HR 63 | Wt 153.0 lb

## 2018-11-11 DIAGNOSIS — E785 Hyperlipidemia, unspecified: Secondary | ICD-10-CM | POA: Diagnosis not present

## 2018-11-11 DIAGNOSIS — R0789 Other chest pain: Secondary | ICD-10-CM | POA: Diagnosis not present

## 2018-11-11 DIAGNOSIS — I10 Essential (primary) hypertension: Secondary | ICD-10-CM | POA: Diagnosis not present

## 2018-11-11 DIAGNOSIS — R079 Chest pain, unspecified: Secondary | ICD-10-CM

## 2018-11-11 NOTE — Patient Instructions (Signed)
Medication Instructions:  The current medical regimen is effective;  continue present plan and medications.  If you need a refill on your cardiac medications before your next appointment, please call your pharmacy.   Follow-Up: Follow up as needed with Dr Hochrein.  Thank you for choosing [facility]!!      

## 2018-11-13 ENCOUNTER — Other Ambulatory Visit: Payer: Self-pay | Admitting: Family Medicine

## 2018-11-30 ENCOUNTER — Ambulatory Visit (INDEPENDENT_AMBULATORY_CARE_PROVIDER_SITE_OTHER): Payer: Medicare HMO | Admitting: Licensed Clinical Social Worker

## 2018-11-30 DIAGNOSIS — N183 Chronic kidney disease, stage 3 (moderate): Secondary | ICD-10-CM

## 2018-11-30 DIAGNOSIS — E785 Hyperlipidemia, unspecified: Secondary | ICD-10-CM | POA: Diagnosis not present

## 2018-11-30 DIAGNOSIS — E1169 Type 2 diabetes mellitus with other specified complication: Secondary | ICD-10-CM | POA: Diagnosis not present

## 2018-11-30 DIAGNOSIS — E1159 Type 2 diabetes mellitus with other circulatory complications: Secondary | ICD-10-CM | POA: Diagnosis not present

## 2018-11-30 DIAGNOSIS — I10 Essential (primary) hypertension: Secondary | ICD-10-CM

## 2018-11-30 DIAGNOSIS — K2100 Gastro-esophageal reflux disease with esophagitis, without bleeding: Secondary | ICD-10-CM

## 2018-11-30 DIAGNOSIS — E1122 Type 2 diabetes mellitus with diabetic chronic kidney disease: Secondary | ICD-10-CM | POA: Diagnosis not present

## 2018-11-30 DIAGNOSIS — R42 Dizziness and giddiness: Secondary | ICD-10-CM

## 2018-11-30 NOTE — Chronic Care Management (AMB) (Signed)
Care Management Note   Kellie SimmeringMary W Corro is a 73 y.o. year old female who is a primary care patient of [name], Elige RadonJoshua A, MD. The CM team was consulted for assistance with chronic disease management and care coordination.   I reached out to Garry HeaterMary W Taketa by phone today.   Review of patient status, including review of consultants reports, relevant laboratory and other test results, and collaboration with appropriate care team members and the patient's provider was performed as part of comprehensive patient evaluation and provision of chronic care management services.   Social Determinants of Health:risk of depression; risk of social isolation; risk of financial strain    [facility]  PHQ-9 Total Score  6     GAD 7 : Generalized Anxiety Score 10/07/2018  Nervous, Anxious, on Edge 1  Control/stop worrying 1  Worry too much - different things 0  Trouble relaxing 1  Restless 0  Easily annoyed or irritable 0  Afraid - awful might happen 0  Total GAD 7 Score 3  Anxiety Difficulty Somewhat difficult   Medications    acetaminophen (TYLENOL) 500 MG tablet    Alcohol Swabs (B-D SINGLE USE SWABS REGULAR) PADS    ASPIRIN ADULT LOW STRENGTH 81 MG EC tablet    atorvastatin (LIPITOR) 20 MG tablet    Blood Glucose Calibration (TRUE METRIX LEVEL 2) Normal SOLN    cholecalciferol (VITAMIN D) 25 MCG (1000 UT) tablet    diclofenac sodium (VOLTAREN) 1 % GEL    GLUCERNA (GLUCERNA) LIQD    glucose blood (TRUE METRIX BLOOD GLUCOSE TEST) test strip    hydrALAZINE (APRESOLINE) 10 MG tablet    JANUMET XR 50-500 MG TB24    Liniments (ORTHOGEL) GEL    losartan-hydrochlorothiazide (HYZAAR) 50-12.5 MG tablet    metoprolol succinate (TOPROL-XL) 50 MG 24 hr tablet(Expired)    nitroGLYCERIN (NITROSTAT) 0.4 MG SL tablet    omeprazole (PRILOSEC) 40 MG capsule    polyethylene glycol powder (GLYCOLAX/MIRALAX) powder    PRODIGY TWIST TOP LANCETS 28G  MISC    Goals    . Client wants to talk with LCSW about mannaging stress issues faced (pt-stated)     Current Barriers:  . Financial challenges . Transport challenge  Clinical Social Work Clinical Goal(s):  . Client to talk with LCSW in next 30 days about client managing stress issues  Interventions: . Talked with client about CCM services . [facility] . Talked with client about transport needs of client . Talked with client about [facility] support . Provided counseling support for client  Patient Self Care Activities:  . Takes medications as prescribed . Attends scheduled medical appointments  Plan: Client to attend medical appointments LCSW to call client in next 3 weeks to discuss psychosocial needs of client [facility]      Initial goal documentation        Client said her husband passed away on October 18, 2018.  Client is grieving loss of her spouse. LCSW provided counseling support for client. She has financial challenges. She has 2 sons. Client has Food Stamps benefit. She spoke of stress issues faced.Client said she has been able to talk with family members about death of her spouse.and that talking with family members in this way has been helpful to her. CS[facility] with [facility] program.Client  said she has adequate food supply. She is having difficulty sleeping. She is taking medications as prescribed. She said she has reduced energy some times. Client has support from her sons. She said previously that she had talked with representative from [facility] recently. She said she sometimes feels dizzy when walking. She said she was thinking about getting a cane to use to ambulate. LCSW and client spoke of recent death of client's spouse. She said he had been ill for a while and had been staying at [facility] for a  number of weeks when he passed away on Oct 19, 2018.  LCSW talked with client about her use of [employer] bus for local transport help. LCSW encouraged client to communicate via phone with representative of [facility]. . Client said she has appointment on 12/03/2018 with Dr. Warrick Parisian. LCSW encouraged Leidy to call RNCM to discuss nursing needs of clientMary said she had difficult time adjusting to death of her spouse but she said she was starting to adjust a little more. She said she was trying to get out more from her home and go shopping and do community errands to help her have variety in her daily schedule.   Follow Up Plan: LCSW will call client in next 3 weeks to discuss the psychosocial needs of client  [name].Renee Beale MSW, LCSW Licensed Clinical Social Worker [facility] Care Management [phone]

## 2018-11-30 NOTE — Patient Instructions (Signed)
Licensed Clinical Social Worker Visit Information  Goals we discussed today:  Goals    . Client wants to talk with LCSW about mannaging streess issues faced (pt-stated)     Current Barriers:  . Financial challenges . Transport challenge  Clinical Social Work Clinical Goal(s):  . Client to talk with LCSW in next 30 days about client managing stress issues  Interventions: . Talked with client about CCM services . Talked with client about support with [facility] for nursing needs . Talked with client about transport needs of client . Talked about [facility] support  . Provided counseling support for client  Patient Self Care Activities:  . Takes medications as prescribed . Attends scheduled medical appointments  Plan: Client to attend medical appointments LCSW to call client in next 3 weeks to discuss psychosocial needs of client Client to talk with [facility] about resources      Initial goal documentation      .      Materials Provided: No  Follow Up Plan:LCSW to call client in next 3 weeks to discuss the psychosocial needs of client   The patient verbalized understanding of instructions provided today and declined a print copy of patient instruction materials.   [name].Stancil Deisher MSW, LCSW Licensed Clinical Social Worker [facility] [phone]

## 2018-12-01 ENCOUNTER — Ambulatory Visit: Payer: Medicare HMO | Admitting: Family Medicine

## 2018-12-03 ENCOUNTER — Encounter: Payer: Self-pay | Admitting: Family Medicine

## 2018-12-03 ENCOUNTER — Ambulatory Visit (INDEPENDENT_AMBULATORY_CARE_PROVIDER_SITE_OTHER): Payer: Medicare HMO | Admitting: Family Medicine

## 2018-12-03 ENCOUNTER — Other Ambulatory Visit: Payer: Self-pay

## 2018-12-03 VITALS — BP 120/62 | HR 66 | Temp 97.8°F | Ht 62.0 in | Wt 146.0 lb

## 2018-12-03 DIAGNOSIS — N183 Chronic kidney disease, stage 3 (moderate): Secondary | ICD-10-CM | POA: Diagnosis not present

## 2018-12-03 DIAGNOSIS — E785 Hyperlipidemia, unspecified: Secondary | ICD-10-CM

## 2018-12-03 DIAGNOSIS — R630 Anorexia: Secondary | ICD-10-CM | POA: Diagnosis not present

## 2018-12-03 DIAGNOSIS — E1159 Type 2 diabetes mellitus with other circulatory complications: Secondary | ICD-10-CM

## 2018-12-03 DIAGNOSIS — E1169 Type 2 diabetes mellitus with other specified complication: Secondary | ICD-10-CM | POA: Diagnosis not present

## 2018-12-03 DIAGNOSIS — K2100 Gastro-esophageal reflux disease with esophagitis, without bleeding: Secondary | ICD-10-CM

## 2018-12-03 DIAGNOSIS — E1122 Type 2 diabetes mellitus with diabetic chronic kidney disease: Secondary | ICD-10-CM

## 2018-12-03 DIAGNOSIS — I10 Essential (primary) hypertension: Secondary | ICD-10-CM

## 2018-12-03 LAB — BAYER DCA HB A1C WAIVED: HB A1C (BAYER DCA - WAIVED): 7.2 % — ABNORMAL HIGH (ref <7.0)

## 2018-12-03 MED ORDER — LOSARTAN POTASSIUM-HCTZ 50-12.5 MG PO TABS: 1.0000 | ORAL_TABLET | Freq: Every day | ORAL | 3 refills | Status: DC

## 2018-12-03 MED ORDER — HYDRALAZINE HCL 10 MG PO TABS: 10.0000 mg | ORAL_TABLET | Freq: Two times a day (BID) | ORAL | 3 refills | Status: DC

## 2018-12-03 MED ORDER — OMEPRAZOLE 40 MG PO CPDR: 40.0000 mg | DELAYED_RELEASE_CAPSULE | Freq: Every day | ORAL | 1 refills | Status: DC

## 2018-12-03 MED ORDER — GLUCERNA PO LIQD: 237.0000 mL | Freq: Two times a day (BID) | ORAL | 2 refills | Status: DC

## 2018-12-03 MED ORDER — JANUMET XR 50-500 MG PO TB24: 1.0000 | ORAL_TABLET | Freq: Two times a day (BID) | ORAL | 0 refills | Status: DC

## 2018-12-03 NOTE — Progress Notes (Signed)
BP 120/62   Pulse 66   Temp 97.8 F (36.6 C) (Temporal)   Ht 5\' 2"  (1.575 m)   Wt 146 lb (66.2 kg)   SpO2 100%   BMI 26.70 kg/m    Subjective:   Patient ID: Tonya Carpenter, female    DOB: 1945/07/11, 73 y.o.   MRN: [id number]  HPI: Tonya Carpenter is a 73 y.o. female presenting on 12/03/2018 for Diabetes, Hypertension, and Medical Management of Chronic Issues (3 month )   HPI Type 2 diabetes mellitus Patient comes in today for recheck of his diabetes. Patient has been currently taking Janumet and. Patient is currently on an ACE inhibitor/ARB. Patient has not seen an ophthalmologist this year. Patient denies any issues with their feet.   Hypertension Patient is currently on hydralazine and losartan hydrochlorothiazide and metoprolol, and their blood pressure today is 120/62. Patient denies any lightheadedness or dizziness. Patient denies headaches, blurred vision, chest pains, shortness of breath, or weakness. Denies any side effects from medication and is content with current medication.   Hyperlipidemia Patient is coming in for recheck of his hyperlipidemia. The patient is currently taking Lipitor. They deny any issues with myalgias or history of liver damage from it. They deny any focal numbness or weakness or chest pain.   GERD Patient is currently on omeprazole.  She denies any major symptoms or abdominal pain or belching or burping. She denies any blood in her stool or lightheadedness or dizziness.   Patient says she has been having decreased appetite along with her acid indigestion.  She is also been dealing with this a lot since her husband passed away and dealing with sadness and depression.  She says she does have her son who is helping take care of her and she has been leaning on her church as well to help but it still been difficult time for her.  She is concerned because she is lost some weight but she does not think she is depressed and does not want to discuss treatment for any  kind of depression she is is just working through the grieving process.  Relevant past medical, surgical, family and social history reviewed and updated as indicated. Interim medical history since our last visit reviewed. Allergies and medications reviewed and updated.  Review of Systems  Constitutional: Negative for chills and fever.  Eyes: Negative for visual disturbance.  Respiratory: Negative for chest tightness and shortness of breath.   Cardiovascular: Negative for chest pain and leg swelling.  Genitourinary: Negative for difficulty urinating and dysuria.  Musculoskeletal: Negative for back pain and gait problem.  Skin: Negative for rash.  Neurological: Negative for dizziness, light-headedness and headaches.  Psychiatric/Behavioral: Negative for agitation and behavioral problems.  All other systems reviewed and are negative.   Per HPI unless specifically indicated above   Allergies as of 12/03/2018   No Known Allergies     Medication List       Accurate as of December 03, 2018 11:50 AM. If you have any questions, ask your nurse or doctor.        acetaminophen 500 MG tablet Commonly known as: TYLENOL Take 1,000 mg by mouth every 6 (six) hours as needed.   Aspirin Adult Low Strength 81 MG EC tablet Generic drug: aspirin TAKE 1 TABLET EVERY DAY   atorvastatin 20 MG tablet Commonly known as: LIPITOR Take 1 tablet (20 mg total) by mouth daily.   B-D SINGLE USE SWABS REGULAR Pads USE TO CHECK BLOOD  SUGAR TWICE DAILY AND AS NEEDED   cholecalciferol 25 MCG (1000 UT) tablet Commonly known as: VITAMIN D TAKE 1 TABLET EVERY DAY   diclofenac sodium 1 % Gel Commonly known as: VOLTAREN APPLY 2 GRAMS TOPICALLY 4 TIMES DAILY.   Glucerna Liqd Take 237 mLs by mouth 2 (two) times daily between meals.   hydrALAZINE 10 MG tablet Commonly known as: APRESOLINE Take 1 tablet (10 mg total) by mouth 2 (two) times daily.   Janumet XR 50-500 MG Tb24 Generic drug:  SitaGLIPtin-MetFORMIN HCl Take 1 tablet by mouth 2 (two) times daily.   losartan-hydrochlorothiazide 50-12.5 MG tablet Commonly known as: HYZAAR Take 1 tablet by mouth daily.   metoprolol succinate 50 MG 24 hr tablet Commonly known as: TOPROL-XL Take 1 tablet (50 mg total) by mouth daily. Take with or immediately following a meal.   nitroGLYCERIN 0.4 MG SL tablet Commonly known as: NITROSTAT PLACE 1 TABLET (0.4 MG TOTAL) UNDER THE TONGUE EVERY 5 (FIVE) MINUTES AS NEEDED. FOR CHEST PAIN   omeprazole 40 MG capsule Commonly known as: PRILOSEC TAKE 1 CAPSULE EVERY DAY   Orthogel Gel Apply 1 application topically daily as needed (pain).   polyethylene glycol powder 17 GM/SCOOP powder Commonly known as: GLYCOLAX/MIRALAX Take 17 g by mouth 2 (two) times daily as needed.   Prodigy Twist Top Lancets 28G Misc TEST TWICE DAILY   True Metrix Blood Glucose Test test strip Generic drug: glucose blood Test blood sugar twice daily and as needed Dx E11.9   True Metrix Level 2 Normal Soln Use as needed with BS machine        Objective:   BP 120/62   Pulse 66   Temp 97.8 F (36.6 C) (Temporal)   Ht 5\' 2"  (1.575 m)   Wt 146 lb (66.2 kg)   SpO2 100%   BMI 26.70 kg/m   Wt Readings from Last 3 Encounters:  12/03/18 146 lb (66.2 kg)  11/11/18 153 lb (69.4 kg)  09/28/18 153 lb 6.4 oz (69.6 kg)    Physical Exam Vitals signs and nursing note reviewed.  Constitutional:      General: She is not in acute distress.    Appearance: She is well-developed. She is not diaphoretic.  Eyes:     Conjunctiva/sclera: Conjunctivae normal.  Cardiovascular:     Rate and Rhythm: Normal rate and regular rhythm.     Heart sounds: Normal heart sounds. No murmur.  Pulmonary:     Effort: Pulmonary effort is normal. No respiratory distress.     Breath sounds: Normal breath sounds. No wheezing.  Abdominal:     General: Abdomen is flat. Bowel sounds are normal. There is no distension.      Tenderness: There is no abdominal tenderness. There is no right CVA tenderness, left CVA tenderness, guarding or rebound.  Musculoskeletal: Normal range of motion.        General: No tenderness.  Skin:    General: Skin is warm and dry.     Findings: No rash.  Neurological:     Mental Status: She is alert and oriented to person, place, and time.     Coordination: Coordination normal.  Psychiatric:        Behavior: Behavior normal.       Assessment & Plan:   Problem List Items Addressed This Visit      Cardiovascular and Mediastinum   Hypertension associated with diabetes (HCC)   Relevant Medications   hydrALAZINE (APRESOLINE) 10 MG tablet   losartan-hydrochlorothiazide (  HYZAAR) 50-12.5 MG tablet   JANUMET XR 50-500 MG TB24   Other Relevant Orders   TSH (Completed)     Digestive   GERD (gastroesophageal reflux disease)   Relevant Medications   omeprazole (PRILOSEC) 40 MG capsule   Other Relevant Orders   TSH (Completed)     Endocrine   Type 2 diabetes mellitus (HCC) - Primary   Relevant Medications   losartan-hydrochlorothiazide (HYZAAR) 50-12.5 MG tablet   JANUMET XR 50-500 MG TB24   Other Relevant Orders   Bayer DCA Hb A1c Waived (Completed)   Hyperlipidemia associated with type 2 diabetes mellitus (HCC)   Relevant Medications   losartan-hydrochlorothiazide (HYZAAR) 50-12.5 MG tablet   JANUMET XR 50-500 MG TB24    Other Visit Diagnoses    Poor appetite          A1c 7.2, no change medication, her blood pressure looks great and continue current medication for that as well.  Her husband passed away and she has been dealing with some sadness without but she denies being depressed and denies need anything major for help and she does have a son who is helping take care of her. Follow up plan: Return in about 3 months (around 03/04/2019), or if symptoms worsen or fail to improve, for Diabetes recheck.  Counseling provided for all of the vaccine components Orders  Placed This Encounter  Procedures  . [facility] Hb A1c Waived    Arville CareJoshua [name], MD [facility] 12/03/2018, 11:50 AM

## 2018-12-04 ENCOUNTER — Telehealth: Payer: Self-pay | Admitting: Family Medicine

## 2018-12-04 LAB — TSH: TSH: 0.977 u[IU]/mL (ref 0.450–4.500)

## 2018-12-04 NOTE — Telephone Encounter (Signed)
Patient states that she just feels like she needs someone there.  Advised patient that by documentation in chart she does not qualify.  Patient verbalized understanding.

## 2018-12-04 NOTE — Telephone Encounter (Signed)
I do not know what she would want home health for, what does she need help with codes based on what I discussed with her I do not think there was anything that would qualify but if she has something that she needs help with and let me know

## 2018-12-08 ENCOUNTER — Other Ambulatory Visit: Payer: Self-pay

## 2018-12-09 ENCOUNTER — Ambulatory Visit (INDEPENDENT_AMBULATORY_CARE_PROVIDER_SITE_OTHER): Payer: Medicare HMO

## 2018-12-09 ENCOUNTER — Encounter: Payer: Self-pay | Admitting: Family

## 2018-12-09 ENCOUNTER — Ambulatory Visit: Payer: Medicare HMO | Admitting: Family Medicine

## 2018-12-09 ENCOUNTER — Ambulatory Visit (INDEPENDENT_AMBULATORY_CARE_PROVIDER_SITE_OTHER): Payer: Medicare HMO | Admitting: Family

## 2018-12-09 VITALS — BP 122/63 | HR 64 | Temp 96.7°F | Resp 20 | Ht 62.0 in | Wt 147.4 lb

## 2018-12-09 DIAGNOSIS — G8929 Other chronic pain: Secondary | ICD-10-CM | POA: Diagnosis not present

## 2018-12-09 DIAGNOSIS — M25511 Pain in right shoulder: Secondary | ICD-10-CM

## 2018-12-09 MED ORDER — DICLOFENAC SODIUM 75 MG PO TBEC: 75.0000 mg | DELAYED_RELEASE_TABLET | Freq: Two times a day (BID) | ORAL | 3 refills | Status: DC

## 2018-12-09 MED ORDER — PREDNISONE 10 MG (21) PO TBPK: ORAL_TABLET | ORAL | 0 refills | Status: DC

## 2018-12-09 NOTE — Patient Instructions (Signed)
Shoulder Pain Many things can cause shoulder pain, including:  An injury to the shoulder.  Overuse of the shoulder.  Arthritis. The source of the pain can be:  Inflammation.  An injury to the shoulder joint.  An injury to a tendon, ligament, or bone. Follow these instructions at home: Pay attention to changes in your symptoms. Let your health care provider know about them. Follow these instructions to relieve your pain. If you have a sling:  Wear the sling as told by your health care provider. Remove it only as told by your health care provider.  Loosen the sling if your fingers tingle, become numb, or turn cold and blue.  Keep the sling clean.  If the sling is not waterproof: ? Do not let it get wet. Remove it to shower or bathe.  Move your arm as little as possible, but keep your hand moving to prevent swelling. Managing pain, stiffness, and swelling   If directed, put ice on the painful area: ? Put ice in a plastic bag. ? Place a towel between your skin and the bag. ? Leave the ice on for 20 minutes, 2-3 times per day. Stop applying ice if it does not help with the pain.  Squeeze a soft ball or a foam pad as much as possible. This helps to keep the shoulder from swelling. It also helps to strengthen the arm. General instructions  Take over-the-counter and prescription medicines only as told by your health care provider.  Keep all follow-up visits as told by your health care provider. This is important. Contact a health care provider if:  Your pain gets worse.  Your pain is not relieved with medicines.  New pain develops in your arm, hand, or fingers. Get help right away if:  Your arm, hand, or fingers: ? Tingle. ? Become numb. ? Become swollen. ? Become painful. ? Turn white or blue. Summary  Shoulder pain can be caused by an injury, overuse, or arthritis.  Pay attention to changes in your symptoms. Let your health care provider know about them.   This condition may be treated with a sling, ice, and pain medicines.  Contact your health care provider if the pain gets worse or new pain develops. Get help right away if your arm, hand, or fingers tingle or become numb, swollen, or painful.  Keep all follow-up visits as told by your health care provider. This is important. This information is not intended to replace advice given to you by your health care provider. Make sure you discuss any questions you have with your health care provider. Document Released: 12/12/2004 Document Revised: 09/16/2017 Document Reviewed: 09/16/2017 Elsevier Patient Education  2020 Elsevier Inc.  

## 2018-12-09 NOTE — Progress Notes (Signed)
Subjective:    Patient ID: Tonya Carpenter, female    DOB: 1946/02/14, 73 y.o.   MRN: [id number]  Chief Complaint  Patient presents with  . right shoulder pain    Shoulder Pain  The pain is present in the right shoulder. This is a chronic problem. The current episode started more than 1 year ago. There has been no history of extremity trauma. The problem occurs intermittently. The problem has been gradually worsening. The quality of the pain is described as aching. The pain is at a severity of 8/10. The pain is moderate. Pertinent negatives include no inability to bear weight or itching. The symptoms are aggravated by activity. She has tried movement, OTC ointments, OTC pain meds and rest for the symptoms. The treatment provided mild relief.      Review of Systems  Skin: Negative for itching.  All other systems reviewed and are negative.      Objective:   Physical Exam Vitals signs reviewed.  Constitutional:      General: She is not in acute distress.    Appearance: She is well-developed.  HENT:     Head: Normocephalic and atraumatic.     Right Ear: Tympanic membrane normal.     Left Ear: Tympanic membrane normal.  Eyes:     Pupils: Pupils are equal, round, and reactive to light.  Neck:     Musculoskeletal: Normal range of motion and neck supple.     Thyroid: No thyromegaly.  Cardiovascular:     Rate and Rhythm: Normal rate and regular rhythm.     Heart sounds: Normal heart sounds. No murmur.  Pulmonary:     Effort: Pulmonary effort is normal. No respiratory distress.     Breath sounds: Normal breath sounds. No wheezing.  Abdominal:     General: Bowel sounds are normal. There is no distension.     Palpations: Abdomen is soft.     Tenderness: There is no abdominal tenderness.  Musculoskeletal:        General: No tenderness.     Comments: Full ROM of right shoulder, pain with abudction  Skin:    General: Skin is warm and dry.  Neurological:     Mental Status: She is  alert and oriented to person, place, and time.     Cranial Nerves: No cranial nerve deficit.     Deep Tendon Reflexes: Reflexes are normal and symmetric.  Psychiatric:        Behavior: Behavior normal.        Thought Content: Thought content normal.        Judgment: Judgment normal.      BP 122/63   Pulse 64   Temp (!) 96.7 F (35.9 C) (Temporal)   Resp 20   Ht 5\' 2"  (1.575 m)   Wt 147 lb 6.4 oz (66.9 kg)   SpO2 100%   BMI 26.96 kg/m      Assessment & Plan:  Tonya Carpenter comes in today with chief complaint of right shoulder pain   Diagnosis and orders addressed:  1. Chronic right shoulder pain Rest Ice  ROM exercises encouraged Keep follow up with PCP Call if symptoms worsen or do not improve - DG Shoulder Right; Future - diclofenac (VOLTAREN) 75 MG EC tablet; Take 1 tablet (75 mg total) by mouth 2 (two) times daily.  Dispense: 60 tablet; Refill: 3 - Ambulatory referral to Physical Therapy - predniSONE (STERAPRED UNI-PAK 21 TAB) 10 MG (21) TBPK tablet; Use as  directed  Dispense: 21 tablet; Refill: 0   Evelina Dun, FNP

## 2018-12-10 ENCOUNTER — Telehealth: Payer: Self-pay | Admitting: Family Medicine

## 2018-12-10 DIAGNOSIS — G8929 Other chronic pain: Secondary | ICD-10-CM

## 2018-12-10 NOTE — Telephone Encounter (Signed)
Patient aware and states she would like Dr. Merita Norton advise.  Would like for him to review the xray and office note and advise.  Patient also states that she does not have transportation to see Ortho.

## 2018-12-10 NOTE — Telephone Encounter (Signed)
Patient seen Christy yesterday - please advise  

## 2018-12-10 NOTE — Telephone Encounter (Signed)
Referral to Ortho placed. Continue medication that was prescribed yesterday.

## 2018-12-10 NOTE — Telephone Encounter (Signed)
X-ray shows some suggestions of arthritis and or possible rotator cuff tear, the 2 options at this point would be to go see an orthopedic or come into our office to try an injection in her shoulder, if she prefer to try the injection first we can do that, if she wants to see OrthO there are orthopedics that come up to [place]

## 2018-12-10 NOTE — Telephone Encounter (Signed)
Attempted to contact patient - NA °

## 2018-12-14 ENCOUNTER — Telehealth: Payer: Self-pay | Admitting: Family Medicine

## 2018-12-15 NOTE — Telephone Encounter (Signed)
Can we check on her Ortho referral. It would be best for her to follow up with them.

## 2018-12-16 ENCOUNTER — Encounter (HOSPITAL_COMMUNITY): Payer: Self-pay

## 2018-12-16 ENCOUNTER — Telehealth: Payer: Self-pay | Admitting: Family Medicine

## 2018-12-16 ENCOUNTER — Emergency Department (HOSPITAL_COMMUNITY): Payer: Medicare HMO

## 2018-12-16 ENCOUNTER — Emergency Department (HOSPITAL_COMMUNITY)
Admission: EM | Admit: 2018-12-16 | Discharge: 2018-12-16 | Disposition: A | Discharge status: Home / Self Care | Payer: Medicare HMO | Attending: Emergency Medicine | Admitting: Emergency Medicine

## 2018-12-16 ENCOUNTER — Other Ambulatory Visit: Payer: Self-pay

## 2018-12-16 DIAGNOSIS — Z7984 Long term (current) use of oral hypoglycemic drugs: Secondary | ICD-10-CM | POA: Insufficient documentation

## 2018-12-16 DIAGNOSIS — Z7982 Long term (current) use of aspirin: Secondary | ICD-10-CM | POA: Diagnosis not present

## 2018-12-16 DIAGNOSIS — G8929 Other chronic pain: Secondary | ICD-10-CM

## 2018-12-16 DIAGNOSIS — I10 Essential (primary) hypertension: Secondary | ICD-10-CM | POA: Diagnosis not present

## 2018-12-16 DIAGNOSIS — M25511 Pain in right shoulder: Secondary | ICD-10-CM | POA: Diagnosis not present

## 2018-12-16 DIAGNOSIS — Z79899 Other long term (current) drug therapy: Secondary | ICD-10-CM | POA: Insufficient documentation

## 2018-12-16 DIAGNOSIS — E119 Type 2 diabetes mellitus without complications: Secondary | ICD-10-CM | POA: Diagnosis not present

## 2018-12-16 DIAGNOSIS — I259 Chronic ischemic heart disease, unspecified: Secondary | ICD-10-CM | POA: Diagnosis not present

## 2018-12-16 DIAGNOSIS — M25519 Pain in unspecified shoulder: Secondary | ICD-10-CM | POA: Diagnosis not present

## 2018-12-16 MED ORDER — METHYLPREDNISOLONE SODIUM SUCC 125 MG IJ SOLR
80.0000 mg | Freq: Once | INTRAMUSCULAR | Status: AC
  Administered 2018-12-16: 80 mg via INTRAMUSCULAR
  Filled 2018-12-16: qty 2

## 2018-12-16 NOTE — Telephone Encounter (Signed)
appt made.  Patient aware  

## 2018-12-16 NOTE — ED Triage Notes (Signed)
Pt brought in by EMS for right shoulder for weeks. Has hx of bursitis. Reports had xrays last week Pt currently taking diclofenc and prednisone

## 2018-12-16 NOTE — ED Provider Notes (Signed)
[facility] Provider Note   CSN: [id number] Arrival date & time: 12/16/18  1140     History   Chief Complaint Chief Complaint  Patient presents with  . Shoulder Pain    HPI Tonya SimmeringMary W Carpenter is a 73 y.o. female.     The history is provided by the patient. No language interpreter was used.  Shoulder Pain Location:  Shoulder Shoulder location:  R shoulder Injury: no   Pain details:    Quality:  Aching   Radiates to:  R shoulder   Severity:  Moderate   Onset quality:  Gradual   Timing:  Constant   Progression:  Worsening Dislocation: no   Relieved by:  Nothing Worsened by:  Nothing Associated symptoms: no fever   Pt complains of right shoulder pain.  Pt reports she has a history of bursitis.  Pt reports pain was bad today.  Pt states she took tylenol and pain is better.  Pt has an rx for prednisone 10 mg.  Pt was suppose to be taking a taper.  Pt has been taking one pill a day    Past Medical History:  Diagnosis Date  . Anxiety   . Arthritis    "hands" (01/14/2013)  . Asthma   . Cataract   . Coronary artery disease    Non obstructive CAD 2014 cath.   . Exertional shortness of breath   . Hyperlipidemia   . Hypertension   . Stroke [facility](HCC)    "light stroke long years ago; didn't affect me permanently" (01/14/2013)  . Type 2 diabetes mellitus [facility](HCC)     Patient Active Problem List   Diagnosis Date Noted  . Dizziness 04/22/2018  . Shoulder impingement syndrome, right 12/02/2017  . GERD (gastroesophageal reflux disease) 10/20/2013  . Frequency 02/18/2013  . Coronary atherosclerosis of native coronary artery 01/14/2013  . CAD, multiple vessel 07/10/2012  . CARDIAC MURMUR 07/03/2009  . Type 2 diabetes mellitus (HCC) 09/12/2008  . Hyperlipidemia associated with type 2 diabetes mellitus (HCC) 09/12/2008  . Hypertension associated with diabetes (HCC) 09/12/2008    Past Surgical History:  Procedure Laterality Date  . APPENDECTOMY    . CARDIAC  CATHETERIZATION  2008   no angiographically significant CAD  . CARDIOVASCULAR STRESS TEST  12/2009   no evidence for stress-induced reversibility or ischemia.  She had a fixed anterior septal wall defect, possibly related to breast attenuation and her EF was 54%.  Marland Kitchen. CATARACT EXTRACTION W/PHACO Right 10/25/2014   Procedure: CATARACT EXTRACTION PHACO AND INTRAOCULAR LENS PLACEMENT; CDE:  17.28;  Surgeon: Jethro BolusMark Shapiro, MD;  Location: [facility];  Service: Ophthalmology;  Laterality: Right;  . CATARACT EXTRACTION W/PHACO Left 11/08/2014   Procedure: CATARACT EXTRACTION PHACO AND INTRAOCULAR LENS PLACEMENT (IOC);  Surgeon: Jethro BolusMark Shapiro, MD;  Location: [facility];  Service: Ophthalmology;  Laterality: Left;  CDE: 5.94  . CHOLECYSTECTOMY    . DILATION AND CURETTAGE OF UTERUS    . LEFT HEART CATHETERIZATION WITH CORONARY ANGIOGRAM N/A 01/14/2013   Procedure: LEFT HEART CATHETERIZATION WITH CORONARY ANGIOGRAM;  Surgeon: Kathleene Hazelhristopher D McAlhany, MD;  [facility];  Service: Cardiovascular;  Laterality: N/A;  . TONSILLECTOMY    . TUBAL LIGATION    . VAGINAL HYSTERECTOMY       OB History    Gravida      Para      Term      Preterm      AB      Living  2  SAB      TAB      Ectopic      Multiple      Live Births               Home Medications    Prior to Admission medications   Medication Sig Start Date End Date Taking? Authorizing Provider  acetaminophen (TYLENOL) 500 MG tablet Take 1,000 mg by mouth every 6 (six) hours as needed.    [provider]  Alcohol Swabs (B-D SINGLE USE SWABS REGULAR) PADS USE TO CHECK BLOOD SUGAR TWICE DAILY AND AS NEEDED 12/03/17   Mechele Claude, MD  ASPIRIN ADULT LOW STRENGTH 81 MG EC tablet TAKE 1 TABLET EVERY DAY 05/22/18   [name], Elige Radon, MD  atorvastatin (LIPITOR) 20 MG tablet Take 1 tablet (20 mg total) by mouth daily. 01/29/18   [name], Elige Radon, MD  Blood Glucose Calibration (TRUE METRIX LEVEL 2) Normal SOLN Use as needed  with BS machine 07/24/16   Mechele Claude, MD  cholecalciferol (VITAMIN D) 25 MCG (1000 UT) tablet TAKE 1 TABLET EVERY DAY 07/29/18   [name], Elige Radon, MD  diclofenac (VOLTAREN) 75 MG EC tablet Take 1 tablet (75 mg total) by mouth 2 (two) times daily. 12/09/18   Jannifer Rodney A, FNP  diclofenac sodium (VOLTAREN) 1 % GEL APPLY 2 GRAMS TOPICALLY 4 TIMES DAILY. 08/06/18   [name], Elige Radon, MD  Glucerna (GLUCERNA) LIQD Take 237 mLs by mouth 2 (two) times daily between meals. 12/03/18   [name], Elige Radon, MD  glucose blood (TRUE METRIX BLOOD GLUCOSE TEST) test strip Test blood sugar twice daily and as needed Dx E11.9 08/27/18   [name], Elige Radon, MD  hydrALAZINE (APRESOLINE) 10 MG tablet Take 1 tablet (10 mg total) by mouth 2 (two) times daily. 12/03/18   [name], Elige Radon, MD  JANUMET XR 50-500 MG TB24 Take 1 tablet by mouth 2 (two) times daily. 12/03/18   [name], Elige Radon, MD  Liniments (ORTHOGEL) GEL Apply 1 application topically daily as needed (pain).     [provider]  losartan-hydrochlorothiazide (HYZAAR) 50-12.5 MG tablet Take 1 tablet by mouth daily. 12/03/18   [name], Elige Radon, MD  metoprolol succinate (TOPROL-XL) 50 MG 24 hr tablet Take 1 tablet (50 mg total) by mouth daily. Take with or immediately following a meal. 04/22/18 12/03/18  Rollene Rotunda, MD  nitroGLYCERIN (NITROSTAT) 0.4 MG SL tablet PLACE 1 TABLET (0.4 MG TOTAL) UNDER THE TONGUE EVERY 5 (FIVE) MINUTES AS NEEDED. FOR CHEST PAIN 08/27/18   [name], Elige Radon, MD  omeprazole (PRILOSEC) 40 MG capsule Take 1 capsule (40 mg total) by mouth daily. 12/03/18   [name], Elige Radon, MD  polyethylene glycol powder (GLYCOLAX/MIRALAX) powder Take 17 g by mouth 2 (two) times daily as needed. 04/09/17   [name], Elige Radon, MD  predniSONE (STERAPRED UNI-PAK 21 TAB) 10 MG (21) TBPK tablet Use as directed 12/09/18   Junie Spencer, FNP  PRODIGY TWIST TOP LANCETS 28G MISC TEST TWICE DAILY 12/31/17   [name], Elige Radon, MD     Family History Family History  Problem Relation Age of Onset  . Hypertension Mother   . Hypertension Father   . Cancer Sister        breast  . Heart Problems Son   . Seizures Son     Social History Social History   Tobacco Use  . Smoking status: Never Smoker  . Smokeless tobacco: Never Used  Substance Use Topics  . Alcohol  use: No  . Drug use: No     Allergies   Patient has no known allergies.   Review of Systems Review of Systems  Constitutional: Negative for fever.  All other systems reviewed and are negative.    Physical Exam Updated Vital Signs BP (!) 159/82 (BP Location: Right Arm)   Pulse (!) 55   Temp 98.4 F (36.9 C)   Resp 14   Ht 5\' 2"  (1.575 m)   Wt 66 kg   SpO2 100%   BMI 26.61 kg/m   Physical Exam Vitals signs and nursing note reviewed.  Constitutional:      Appearance: She is well-developed.  HENT:     Head: Normocephalic.  Neck:     Musculoskeletal: Normal range of motion.  Cardiovascular:     Rate and Rhythm: Normal rate.  Pulmonary:     Effort: Pulmonary effort is normal.  Abdominal:     General: There is no distension.  Musculoskeletal:        General: Tenderness present. No swelling.     Comments: Pain with movement   Skin:    General: Skin is warm.  Neurological:     Mental Status: She is alert and oriented to person, place, and time.  Psychiatric:        Mood and Affect: Mood normal.      ED Treatments / Results  Labs (all labs ordered are listed, but only abnormal results are displayed) Labs Reviewed - No data to display  EKG EKG Interpretation  Date/Time:  Wednesday December 16 2018 11:51:42 EDT Ventricular Rate:  53 PR Interval:    QRS Duration: 162 QT Interval:  490 QTC Calculation: 461 R Axis:   20 Text Interpretation:  Sinus rhythm Atrial premature complex Left bundle branch block Baseline wander in lead(s) V6 No significant change from prior. no STEMI  Confirmed by Nanda Quinton [phone]) on 12/16/2018  12:32:09 PM   Radiology Dg Shoulder Right  Result Date: 12/16/2018 CLINICAL DATA:  Pain EXAM: RIGHT SHOULDER - 2+ VIEW COMPARISON:  12/09/2018 FINDINGS: Again noted are degenerative changes of the glenohumeral joint and right AC joint. The humeral head is high-riding, similar to prior study. There is no acute displaced fracture or dislocation. IMPRESSION: No acute displaced fracture or dislocation. Electronically Signed   By: Constance Holster M.D.   On: 12/16/2018 12:31    Procedures Procedures (including critical care time)  Medications Ordered in ED Medications  methylPREDNISolone sodium succinate (SOLU-MEDROL) 125 mg/2 mL injection 80 mg (has no administration in time range)     Initial Impression / Assessment and Plan / ED Course  I have reviewed the triage vital signs and the nursing notes.  Pertinent labs & imaging results that were available during my care of the patient were reviewed by me and considered in my medical decision making (see chart for details).        MDM   I will treat pt with 80mg  of solumedrol IM.  Pt advised to stop oral prednisone and voltaren.  Pt reports better relief with tylenol.  Pt advised dosage of tylenol and to take lowest dose that helps pain.    Final Clinical Impressions(s) / ED Diagnoses   Final diagnoses:  Chronic right shoulder pain    ED Discharge Orders    None    An After Visit Summary was printed and given to the patient.   Fransico Meadow, [place] 12/16/18 1309    Margette Fast, MD 12/17/18 2025

## 2018-12-16 NOTE — Telephone Encounter (Signed)
I am fine with scheduling her for an injection of the shoulder.  If I do not have anything available then we could always put it on [place] Joyce's schedule on a day that I am here and we can do it together if [place] is okay with it.

## 2018-12-16 NOTE — Telephone Encounter (Signed)
Phone call taken care of in different encounter.  This encounter will now be closed  

## 2018-12-16 NOTE — Discharge Instructions (Addendum)
See your Physician as scheduled.  Stop prednisone and diclofenac.  You can take tylenol.  Take as small amount  as possible and only when needed.

## 2018-12-16 NOTE — Telephone Encounter (Signed)
Absolutely.

## 2018-12-17 ENCOUNTER — Telehealth: Payer: Self-pay | Admitting: Family Medicine

## 2018-12-17 ENCOUNTER — Ambulatory Visit: Payer: Medicare HMO | Admitting: Family Medicine

## 2018-12-17 ENCOUNTER — Ambulatory Visit: Payer: Medicare HMO | Attending: Family | Admitting: Physical Therapy

## 2018-12-17 NOTE — Telephone Encounter (Signed)
I think it would possibly be best for her to be set up to see [facility] when they start coming back in November, if she has any issues before this time she can come in and see [place], I missed her today because she was in the emergency department but have her give [place] a call.

## 2018-12-17 NOTE — Telephone Encounter (Signed)
You can tell her that I do not really have a reason for a referral for home health as she is still able to function on her own, I do not think that they would actually come out to her and do anything that is covered by insurance at this point.

## 2018-12-17 NOTE — Telephone Encounter (Signed)
This patient does not have reliable transportation - Ref was sent to [facility] - stated Patient would like to be seen in our office when [facility] is here. They will be here in November.

## 2018-12-17 NOTE — Telephone Encounter (Signed)
Patient aware.

## 2018-12-17 NOTE — Telephone Encounter (Signed)
Can patient get a ride to [place]? She really needs to see a specialists.

## 2018-12-21 ENCOUNTER — Other Ambulatory Visit: Payer: Self-pay

## 2018-12-21 ENCOUNTER — Emergency Department (HOSPITAL_COMMUNITY)
Admission: EM | Admit: 2018-12-21 | Discharge: 2018-12-21 | Disposition: A | Discharge status: Home / Self Care | Payer: Medicare HMO | Attending: Emergency Medicine | Admitting: Emergency Medicine

## 2018-12-21 ENCOUNTER — Encounter (HOSPITAL_COMMUNITY): Payer: Self-pay

## 2018-12-21 ENCOUNTER — Encounter: Payer: Self-pay | Admitting: Family Medicine

## 2018-12-21 DIAGNOSIS — Z79899 Other long term (current) drug therapy: Secondary | ICD-10-CM | POA: Diagnosis not present

## 2018-12-21 DIAGNOSIS — Z7984 Long term (current) use of oral hypoglycemic drugs: Secondary | ICD-10-CM | POA: Diagnosis not present

## 2018-12-21 DIAGNOSIS — Z7982 Long term (current) use of aspirin: Secondary | ICD-10-CM | POA: Diagnosis not present

## 2018-12-21 DIAGNOSIS — I2510 Atherosclerotic heart disease of native coronary artery without angina pectoris: Secondary | ICD-10-CM | POA: Insufficient documentation

## 2018-12-21 DIAGNOSIS — R52 Pain, unspecified: Secondary | ICD-10-CM | POA: Diagnosis not present

## 2018-12-21 DIAGNOSIS — R5381 Other malaise: Secondary | ICD-10-CM | POA: Diagnosis not present

## 2018-12-21 DIAGNOSIS — M7531 Calcific tendinitis of right shoulder: Secondary | ICD-10-CM | POA: Diagnosis not present

## 2018-12-21 DIAGNOSIS — M19011 Primary osteoarthritis, right shoulder: Secondary | ICD-10-CM | POA: Diagnosis not present

## 2018-12-21 DIAGNOSIS — E119 Type 2 diabetes mellitus without complications: Secondary | ICD-10-CM | POA: Diagnosis not present

## 2018-12-21 DIAGNOSIS — M25519 Pain in unspecified shoulder: Secondary | ICD-10-CM | POA: Diagnosis not present

## 2018-12-21 DIAGNOSIS — M25511 Pain in right shoulder: Secondary | ICD-10-CM | POA: Diagnosis present

## 2018-12-21 DIAGNOSIS — I10 Essential (primary) hypertension: Secondary | ICD-10-CM | POA: Insufficient documentation

## 2018-12-21 DIAGNOSIS — J45909 Unspecified asthma, uncomplicated: Secondary | ICD-10-CM | POA: Insufficient documentation

## 2018-12-21 MED ORDER — HYDROCODONE-ACETAMINOPHEN 5-325 MG PO TABS
2.0000 | ORAL_TABLET | Freq: Once | ORAL | Status: AC
  Administered 2018-12-21: 2 via ORAL
  Filled 2018-12-21: qty 2

## 2018-12-21 MED ORDER — HYDROCODONE-ACETAMINOPHEN 5-325 MG PO TABS: 1.0000 | ORAL_TABLET | ORAL | 0 refills | Status: DC | PRN

## 2018-12-21 MED ORDER — DEXAMETHASONE SODIUM PHOSPHATE 10 MG/ML IJ SOLN
10.0000 mg | Freq: Once | INTRAMUSCULAR | Status: AC
  Administered 2018-12-21: 10 mg via INTRAMUSCULAR
  Filled 2018-12-21: qty 1

## 2018-12-21 MED ORDER — ONDANSETRON HCL 4 MG PO TABS
4.0000 mg | ORAL_TABLET | Freq: Once | ORAL | Status: AC
  Administered 2018-12-21: 17:00:00 4 mg via ORAL
  Filled 2018-12-21: qty 1

## 2018-12-21 NOTE — ED Triage Notes (Signed)
Pt brought to ED via [facility] for c/o R shoulder pain. Pt states flare up started this am. Pt has history of bursitis.

## 2018-12-21 NOTE — ED Provider Notes (Signed)
[facility] Provider Note   CSN: [id number] Arrival date & time: 12/21/18  1321     History   Chief Complaint Chief Complaint  Patient presents with  . Shoulder Pain    HPI Tonya Carpenter is a 73 y.o. female.     Patient is a 73 year old female who presents to the emergency department with complaint of right shoulder pain.  The patient states she has problems with her shoulder from time to time.  She says she has been diagnosed with arthritis in multiple areas, but recently she has been having increasing problems with her shoulder with even minimal movement.  She says that she cannot lay on the shoulder without pain.  She has pain at times when she attempts to raise it and says that there is significant decrease in motion.  No recent fall or injury.  She has not had any fever or hot joint reported.  She describes the pain as an ache sometimes a grinding type pain.  She does not have pain every day until recently the last few days it has been getting more and more severe.  She has been taking diclofenac.  She is also finished a dose of steroid medication, but states the pain is still present and at times getting worse.  The history is provided by the patient.  Shoulder Pain Associated symptoms: no back pain and no neck pain     Past Medical History:  Diagnosis Date  . Anxiety   . Arthritis    "hands" (01/14/2013)  . Asthma   . Cataract   . Coronary artery disease    Non obstructive CAD 2014 cath.   . Exertional shortness of breath   . Hyperlipidemia   . Hypertension   . Stroke [facility](HCC)    "light stroke long years ago; didn't affect me permanently" (01/14/2013)  . Type 2 diabetes mellitus [facility](HCC)     Patient Active Problem List   Diagnosis Date Noted  . Dizziness 04/22/2018  . Shoulder impingement syndrome, right 12/02/2017  . GERD (gastroesophageal reflux disease) 10/20/2013  . Frequency 02/18/2013  . Coronary atherosclerosis of native coronary artery  01/14/2013  . CAD, multiple vessel 07/10/2012  . CARDIAC MURMUR 07/03/2009  . Type 2 diabetes mellitus (HCC) 09/12/2008  . Hyperlipidemia associated with type 2 diabetes mellitus (HCC) 09/12/2008  . Hypertension associated with diabetes (HCC) 09/12/2008    Past Surgical History:  Procedure Laterality Date  . APPENDECTOMY    . CARDIAC CATHETERIZATION  2008   no angiographically significant CAD  . CARDIOVASCULAR STRESS TEST  12/2009   no evidence for stress-induced reversibility or ischemia.  She had a fixed anterior septal wall defect, possibly related to breast attenuation and her EF was 54%.  Marland Kitchen. CATARACT EXTRACTION W/PHACO Right 10/25/2014   Procedure: CATARACT EXTRACTION PHACO AND INTRAOCULAR LENS PLACEMENT; CDE:  17.28;  Surgeon: Jethro BolusMark Shapiro, MD;  Location: [facility];  Service: Ophthalmology;  Laterality: Right;  . CATARACT EXTRACTION W/PHACO Left 11/08/2014   Procedure: CATARACT EXTRACTION PHACO AND INTRAOCULAR LENS PLACEMENT (IOC);  Surgeon: Jethro BolusMark Shapiro, MD;  Location: [facility];  Service: Ophthalmology;  Laterality: Left;  CDE: 5.94  . CHOLECYSTECTOMY    . DILATION AND CURETTAGE OF UTERUS    . LEFT HEART CATHETERIZATION WITH CORONARY ANGIOGRAM N/A 01/14/2013   Procedure: LEFT HEART CATHETERIZATION WITH CORONARY ANGIOGRAM;  Surgeon: Kathleene Hazelhristopher D McAlhany, MD;  [facility];  Service: Cardiovascular;  Laterality: N/A;  . TONSILLECTOMY    . TUBAL  LIGATION    . VAGINAL HYSTERECTOMY       OB History    Gravida      Para      Term      Preterm      AB      Living  2     SAB      TAB      Ectopic      Multiple      Live Births               Home Medications    Prior to Admission medications   Medication Sig Start Date End Date Taking? Authorizing Provider  acetaminophen (TYLENOL) 500 MG tablet Take 1,000 mg by mouth every 6 (six) hours as needed.    [provider]  Alcohol Swabs (B-D SINGLE USE SWABS REGULAR) PADS USE TO CHECK BLOOD SUGAR  TWICE DAILY AND AS NEEDED 12/03/17   Mechele Claude, MD  ASPIRIN ADULT LOW STRENGTH 81 MG EC tablet TAKE 1 TABLET EVERY DAY 05/22/18   [name], Elige Radon, MD  atorvastatin (LIPITOR) 20 MG tablet Take 1 tablet (20 mg total) by mouth daily. 01/29/18   [name], Elige Radon, MD  Blood Glucose Calibration (TRUE METRIX LEVEL 2) Normal SOLN Use as needed with BS machine 07/24/16   Mechele Claude, MD  cholecalciferol (VITAMIN D) 25 MCG (1000 UT) tablet TAKE 1 TABLET EVERY DAY 07/29/18   [name], Elige Radon, MD  diclofenac (VOLTAREN) 75 MG EC tablet Take 1 tablet (75 mg total) by mouth 2 (two) times daily. 12/09/18   Jannifer Rodney A, FNP  diclofenac sodium (VOLTAREN) 1 % GEL APPLY 2 GRAMS TOPICALLY 4 TIMES DAILY. 08/06/18   [name], Elige Radon, MD  Glucerna (GLUCERNA) LIQD Take 237 mLs by mouth 2 (two) times daily between meals. 12/03/18   [name], Elige Radon, MD  glucose blood (TRUE METRIX BLOOD GLUCOSE TEST) test strip Test blood sugar twice daily and as needed Dx E11.9 08/27/18   [name], Elige Radon, MD  hydrALAZINE (APRESOLINE) 10 MG tablet Take 1 tablet (10 mg total) by mouth 2 (two) times daily. 12/03/18   [name], Elige Radon, MD  JANUMET XR 50-500 MG TB24 Take 1 tablet by mouth 2 (two) times daily. 12/03/18   [name], Elige Radon, MD  Liniments (ORTHOGEL) GEL Apply 1 application topically daily as needed (pain).     [provider]  losartan-hydrochlorothiazide (HYZAAR) 50-12.5 MG tablet Take 1 tablet by mouth daily. 12/03/18   [name], Elige Radon, MD  metoprolol succinate (TOPROL-XL) 50 MG 24 hr tablet Take 1 tablet (50 mg total) by mouth daily. Take with or immediately following a meal. 04/22/18 12/03/18  Rollene Rotunda, MD  nitroGLYCERIN (NITROSTAT) 0.4 MG SL tablet PLACE 1 TABLET (0.4 MG TOTAL) UNDER THE TONGUE EVERY 5 (FIVE) MINUTES AS NEEDED. FOR CHEST PAIN 08/27/18   [name], Elige Radon, MD  omeprazole (PRILOSEC) 40 MG capsule Take 1 capsule (40 mg total) by mouth daily. 12/03/18   [name], Elige Radon,  MD  polyethylene glycol powder (GLYCOLAX/MIRALAX) powder Take 17 g by mouth 2 (two) times daily as needed. 04/09/17   [name], Elige Radon, MD  predniSONE (STERAPRED UNI-PAK 21 TAB) 10 MG (21) TBPK tablet Use as directed 12/09/18   Junie Spencer, FNP  PRODIGY TWIST TOP LANCETS 28G MISC TEST TWICE DAILY 12/31/17   [name], Elige Radon, MD    Family History Family History  Problem Relation Age of Onset  . Hypertension Mother   . Hypertension  Father   . Cancer Sister        breast  . Heart Problems Son   . Seizures Son     Social History Social History   Tobacco Use  . Smoking status: Never Smoker  . Smokeless tobacco: Never Used  Substance Use Topics  . Alcohol use: No  . Drug use: No     Allergies   Patient has no known allergies.   Review of Systems Review of Systems  Constitutional: Negative for activity change and appetite change.  HENT: Negative for congestion, ear discharge, ear pain, facial swelling, nosebleeds, rhinorrhea, sneezing and tinnitus.   Eyes: Negative for photophobia, pain and discharge.  Respiratory: Negative for cough, choking, shortness of breath and wheezing.   Cardiovascular: Negative for chest pain, palpitations and leg swelling.  Gastrointestinal: Negative for abdominal pain, blood in stool, constipation, diarrhea, nausea and vomiting.  Genitourinary: Negative for difficulty urinating, dysuria, flank pain, frequency and hematuria.  Musculoskeletal: Positive for arthralgias. Negative for back pain, gait problem, myalgias and neck pain.  Skin: Negative for color change, rash and wound.  Neurological: Negative for dizziness, seizures, syncope, facial asymmetry, speech difficulty, weakness and numbness.  Hematological: Negative for adenopathy. Does not bruise/bleed easily.  Psychiatric/Behavioral: Negative for agitation, confusion, hallucinations, self-injury and suicidal ideas. The patient is not nervous/anxious.      Physical Exam Updated Vital  Signs BP (!) 157/82   Pulse 66   Temp 98 F (36.7 C)   Resp 18   Ht 5\' 3"  (1.6 m)   Wt 64.9 kg   SpO2 98%   BMI 25.33 kg/m   Physical Exam Vitals signs and nursing note reviewed.  Constitutional:      Appearance: She is well-developed. She is not toxic-appearing.  HENT:     Head: Normocephalic.     Right Ear: Tympanic membrane and external ear normal.     Left Ear: Tympanic membrane and external ear normal.  Eyes:     General: Lids are normal.     Pupils: Pupils are equal, round, and reactive to light.  Neck:     Musculoskeletal: Normal range of motion and neck supple.     Vascular: No carotid bruit.  Cardiovascular:     Rate and Rhythm: Normal rate and regular rhythm.     Pulses: Normal pulses.     Heart sounds: Normal heart sounds.  Pulmonary:     Effort: No respiratory distress.     Breath sounds: Normal breath sounds.  Abdominal:     General: Bowel sounds are normal.     Palpations: Abdomen is soft.     Tenderness: There is no abdominal tenderness. There is no guarding.  Musculoskeletal:     Right shoulder: She exhibits decreased range of motion, tenderness, crepitus and pain.     Comments: Mild trapezius soreness.  Mild spasm in the trapezius area right greater than left.  There is crepitus with attempted range of motion of the right shoulder.  There is full range of motion of the right elbow, wrist and fingers.  There are degenerative changes noted of the right and left hand and wrist areas.  Lymphadenopathy:     Head:     Right side of head: No submandibular adenopathy.     Left side of head: No submandibular adenopathy.     Cervical: No cervical adenopathy.  Skin:    General: Skin is warm and dry.  Neurological:     Mental Status: She is alert and  oriented to person, place, and time.     Cranial Nerves: No cranial nerve deficit.     Sensory: No sensory deficit.  Psychiatric:        Speech: Speech normal.      ED Treatments / Results  Labs (all labs  ordered are listed, but only abnormal results are displayed) Labs Reviewed - No data to display  EKG None  Radiology No results found.  Procedures Procedures (including critical care time)  Medications Ordered in ED Medications  dexamethasone (DECADRON) injection 10 mg (has no administration in time range)  HYDROcodone-acetaminophen (NORCO/VICODIN) 5-325 MG per tablet 2 tablet (has no administration in time range)  ondansetron (ZOFRAN) tablet 4 mg (has no administration in time range)     Initial Impression / Assessment and Plan / ED Course  I have reviewed the triage vital signs and the nursing notes.  Pertinent labs & imaging results that were available during my care of the patient were reviewed by me and considered in my medical decision making (see chart for details).          Final Clinical Impressions(s) / ED Diagnoses MDM  Blood pressure slightly elevated, otherwise vital signs are within normal limits. Pulse oximetry is 98% on room air.  Within normal limits by my interpretation.  I have reviewed previous x-rays for this patient.  The patient has multiple sites of degenerative changes including the [facility] joint, and the glenohumeral area.  There is evidence for calcific supraspinatus tendinitis present, as well as a high riding humeral head.  There are no hot joints appreciated.  There is no evidence of dislocation.  There is no neurovascular deficit appreciated.  The examination favors arthritis flare of the right shoulder with tendinitis present.  Patient treated in the emergency department with Decadron and hydrocodone.  Patient has a prescription for diclofenac.  Will add short course of hydrocodone to assist with the pain.  Patient referred to Dr. Romeo Apple for orthopedic evaluation of this shoulder and calcific supraspinatus tendinitis.   Final diagnoses:  Calcific tendonitis of right shoulder  Primary osteoarthritis of right shoulder    ED Discharge Orders          Ordered    HYDROcodone-acetaminophen (NORCO/VICODIN) 5-325 MG tablet  Every 4 hours PRN     12/21/18 1731           Ivery Quale, PA-C 12/21/18 1733    Sabas Sous, MD 12/21/18 2134

## 2018-12-21 NOTE — Discharge Instructions (Addendum)
I have reviewed your previous x-rays, and you have a lot of arthritis in your shoulder.  You also have what appears to be calcium deposits in the 1 of the tendons, consistent with calcific tendinitis.  Please see Dr. Aline Brochure for orthopedic evaluation and management assistance with this issue.  Please continue your diclofenac 2 times daily with food.  Use Tylenol extra strength for mild pain.  May use Norco for more severe pain. This medication may cause drowsiness. Please do not drink, drive, or participate in activity that requires concentration while taking this medication.

## 2018-12-22 ENCOUNTER — Ambulatory Visit: Payer: Medicare HMO | Admitting: Licensed Clinical Social Worker

## 2018-12-22 DIAGNOSIS — E1159 Type 2 diabetes mellitus with other circulatory complications: Secondary | ICD-10-CM

## 2018-12-22 DIAGNOSIS — I2510 Atherosclerotic heart disease of native coronary artery without angina pectoris: Secondary | ICD-10-CM

## 2018-12-22 DIAGNOSIS — R42 Dizziness and giddiness: Secondary | ICD-10-CM

## 2018-12-22 DIAGNOSIS — M7541 Impingement syndrome of right shoulder: Secondary | ICD-10-CM

## 2018-12-22 DIAGNOSIS — E1169 Type 2 diabetes mellitus with other specified complication: Secondary | ICD-10-CM

## 2018-12-22 DIAGNOSIS — E785 Hyperlipidemia, unspecified: Secondary | ICD-10-CM

## 2018-12-22 DIAGNOSIS — I152 Hypertension secondary to endocrine disorders: Secondary | ICD-10-CM

## 2018-12-22 NOTE — Patient Instructions (Addendum)
Licensed Clinical Social Worker Visit Information  Goals we discussed today:  Goals    . Client wants to talk with LCSW about mannaging stress issues faced (pt-stated)     Current Barriers:  . Financial challenges . Transport challenge  Clinical Social Work Clinical Goal(s):  . Client to talk with LCSW in next 30 days about client managing stress issues  Interventions: . Talked with client about support with [facility] for nursing needs . Talked about Food Stamps benefit  .  Talked about [employer] support   Talked with client about transport needs of client  Talked with client about pain issues of client  Talked with [name] about upcoming client appointments    Patient Self Care Activities:  . Takes medications as prescribed . Attends scheduled medical appointments  Plan: Client to attend medical appointments LCSW to call client in next 3 weeks to discuss psychosocial needs of client Client to talk with [facility] about resources      Initial goal documentation     .         Materials Provided: No  Follow Up Plan:  LCSW to call client in next 3 weeks to discuss the psychosocial needs of client  The patient verbalized understanding of instructions provided today and declined a print copy of patient instruction materials.   [name].Astrid Vides MSW, LCSW Licensed Clinical Social Worker [facility] [phone]

## 2018-12-22 NOTE — Chronic Care Management (AMB) (Signed)
Care Management Note   Tonya Carpenter is a 73 y.o. year old female who is a primary care patient of [name], Fransisca Kaufmann, MD. The CM team was consulted for assistance with chronic disease management and care coordination.   I reached out to [place] by phone today.   Review of patient status, including review of consultants reports, relevant laboratory and other test results, and collaboration with appropriate care team members and the patient's provider was performed as part of comprehensive patient evaluation and provision of chronic care management services.   Social Determinants of Health: risk of tobacco use; risk of financial strain; risk of depression; risk of social isolation    Office Visit from 12/03/2018 in [place]  PHQ-9 Total Score  6     GAD 7 : Generalized Anxiety Score 10/07/2018  Nervous, Anxious, on Edge 1  Control/stop worrying 1  Worry too much - different things 0  Trouble relaxing 1  Restless 0  Easily annoyed or irritable 0  Afraid - awful might happen 0  Total GAD 7 Score 3  Anxiety Difficulty Somewhat difficult   Medications   New medications from outside sources are available for reconciliation   acetaminophen (TYLENOL) 500 MG tablet    Alcohol Swabs (B-D SINGLE USE SWABS REGULAR) PADS    ASPIRIN ADULT LOW STRENGTH 81 MG EC tablet    atorvastatin (LIPITOR) 20 MG tablet    Blood Glucose Calibration (TRUE METRIX LEVEL 2) Normal SOLN    cholecalciferol (VITAMIN D) 25 MCG (1000 UT) tablet    diclofenac (VOLTAREN) 75 MG EC tablet    diclofenac sodium (VOLTAREN) 1 % GEL    Glucerna (GLUCERNA) LIQD    glucose blood (TRUE METRIX BLOOD GLUCOSE TEST) test strip    hydrALAZINE (APRESOLINE) 10 MG tablet    HYDROcodone-acetaminophen (NORCO/VICODIN) 5-325 MG tablet    JANUMET XR 50-500 MG TB24    Liniments (ORTHOGEL) GEL    losartan-hydrochlorothiazide (HYZAAR) 50-12.5 MG tablet    metoprolol succinate (TOPROL-XL) 50 MG 24 hr  tablet(Expired)    nitroGLYCERIN (NITROSTAT) 0.4 MG SL tablet    omeprazole (PRILOSEC) 40 MG capsule    polyethylene glycol powder (GLYCOLAX/MIRALAX) powder    predniSONE (STERAPRED UNI-PAK 21 TAB) 10 MG (21) TBPK tablet    PRODIGY TWIST TOP LANCETS 28G MISC    Goals    . Client wants to talk with LCSW about mannaging streess issues faced (pt-stated)     Current Barriers:  . Financial challenges . Transport challenge  Clinical Social Work Clinical Goal(s):  . Client to talk with LCSW in next 30 days about client managing stress issues  Interventions: . Talked with client about transport needs of client . Talked with client about support with [facility] for nursing needs . Talked about Food Stamps benefit . Talked about [place] support . Talked with client about pain issues of client . Talked with [name] about upcoming client appointments   Patient Self Care Activities:  . Takes medications as prescribed . Attends scheduled medical appointments  Plan: Client to attend medical appointments LCSW to call client in next 3 weeks to discuss psychosocial needs of client Client to talk with [facility] about resources    Initial goal documentation        Follow Up Plan:  LCSW to call client in next 3 weeks to discuss with client the psychosocial needs of client  [name].Xylon Croom MSW, LCSW Licensed Holiday representative [facility] [phone]

## 2018-12-25 ENCOUNTER — Other Ambulatory Visit: Payer: Self-pay | Admitting: Family Medicine

## 2018-12-25 ENCOUNTER — Telehealth: Payer: Self-pay | Admitting: Family Medicine

## 2018-12-25 DIAGNOSIS — E1169 Type 2 diabetes mellitus with other specified complication: Secondary | ICD-10-CM

## 2018-12-25 MED ORDER — ATORVASTATIN CALCIUM 20 MG PO TABS: 20.0000 mg | ORAL_TABLET | Freq: Every day | ORAL | 3 refills | Status: DC

## 2018-12-25 NOTE — Progress Notes (Signed)
Refilled Lipitor for the patient because she has not filled it recently.

## 2018-12-25 NOTE — Telephone Encounter (Signed)
PT called - she also seen ER for shoulder = they have her some meds = it is stay bothering her . She will start the meds from ER and call Monday if still bothering her.

## 2018-12-29 ENCOUNTER — Other Ambulatory Visit: Payer: Self-pay

## 2018-12-29 ENCOUNTER — Ambulatory Visit (INDEPENDENT_AMBULATORY_CARE_PROVIDER_SITE_OTHER): Payer: Self-pay | Admitting: *Deleted

## 2018-12-29 DIAGNOSIS — Z1211 Encounter for screening for malignant neoplasm of colon: Secondary | ICD-10-CM

## 2018-12-29 MED ORDER — PEG 3350-KCL-NA BICARB-NACL 420 G PO SOLR: 4000.0000 mL | Freq: Once | ORAL | 0 refills | Status: AC

## 2018-12-29 NOTE — Progress Notes (Signed)
Ok to schedule.  DM meds: Janumet: None the morning of  On prep day: Check CBG ac and hs as well (if they normally check their blood sugar) as if the patient feels like their blood sugar is off. Can use soda, juice (that's in [place]) as needed for any low blood sugar.  Check CBG on arrival to endo unit.

## 2018-12-29 NOTE — Patient Instructions (Signed)
Tonya Carpenter   1945/05/01 MRN: [id number]    Procedure Date: 03/31/2019 Time to register: 9:30 am Place to register: [facility] Procedure Time: 10:30 am Scheduled provider: Dr. Oneida Alar  PREPARATION FOR COLONOSCOPY WITH TRI-LYTE SPLIT PREP  Please notify us immediately if you are diabetic, take iron supplements, or if you are on Coumadin or any other blood thinners.   Please hold the following medications:  See letter  You will need to purchase 1 fleet enema and 1 box of Bisacodyl 57m tablets.   1 DAY BEFORE PROCEDURE:  DATE: 03/30/2019   DAY: Tuesday Continue clear liquids the entire day - NO SOLID FOOD.   Diabetic medications adjustments for today: See letter  At 2:00 pm:  Take 2 Bisacodyl tablets.   At 4:00pm:  Start drinking your solution. Make sure you mix well per instructions on the bottle. Try to drink 1 (one) 8 ounce glass every 10-15 minutes until you have consumed HALF the jug. You should complete by 6:00pm.You must keep the left over solution refrigerated until completed next day.  Continue clear liquids. You must drink plenty of clear liquids to prevent dehyration and kidney failure.     DAY OF PROCEDURE:   DATE: 03/31/2019  DAY: Wednesday If you take medications for your heart, blood pressure or breathing, you may take these medications.  Diabetic medications adjustments for today: See letter  Five hours before your procedure time @ 5:30 am:  Finish remaining amout of bowel prep, drinking 1 (one) 8 ounce glass every 10-15 minutes until complete. You have two hours to consume remaining prep.   Three hours before your procedure time @ 7:30 am:  Nothing by mouth.   At least one hour before going to the hospital:  Give yourself one Fleet enema. You may take your morning medications with sip of water unless we have instructed otherwise.      Please see below for Dietary Information.  CLEAR LIQUIDS INCLUDE:  Water Jello (NOT red in color)   Ice Popsicles  (NOT red in color)   Tea (sugar ok, no milk/cream) Powdered fruit flavored drinks  Coffee (sugar ok, no milk/cream) Gatorade/ Lemonade/ Kool-Aid  (NOT red in color)   Juice: apple, white grape, white cranberry Soft drinks  Clear bullion, consomme, broth (fat free beef/chicken/vegetable)  Carbonated beverages (any kind)  Strained chicken noodle soup Hard Candy   Remember: Clear liquids are liquids that will allow you to see your fingers on the other side of a clear glass. Be sure liquids are NOT red in color, and not cloudy, but CLEAR.  DO NOT EAT OR DRINK ANY OF THE FOLLOWING:  Dairy products of any kind   Cranberry juice Tomato juice / V8 juice   Grapefruit juice Orange juice     Red grape juice  Do not eat any solid foods, including such foods as: cereal, oatmeal, yogurt, fruits, vegetables, creamed soups, eggs, bread, crackers, pureed foods in a blender, etc.   HELPFUL HINTS FOR DRINKING PREP SOLUTION:   Make sure prep is extremely cold. Mix and refrigerate the the morning of the prep. You may also put in the freezer.   You may try mixing some Crystal Light or Country Time Lemonade if you prefer. Mix in small amounts; add more if necessary.  Try drinking through a straw  Rinse mouth with water or a mouthwash between glasses, to remove after-taste.  Try sipping on a cold beverage /ice/ popsicles between glasses of prep.  Place  a piece of sugar-free hard candy in mouth between glasses.  If you become nauseated, try consuming smaller amounts, or stretch out the time between glasses. Stop for 30-60 minutes, then slowly start back drinking.        OTHER INSTRUCTIONS  You will need a responsible adult at least 72 years of age to accompany you and drive you home. This person must remain in the waiting room during your procedure. The hospital will cancel your procedure if you do not have a responsible adult with you.   1. Wear loose fitting clothing that is easily  removed. 2. Leave jewelry and other valuables at home.  3. Remove all body piercing jewelry and leave at home. 4. Total time from sign-in until discharge is approximately 2-3 hours. 5. You should go home directly after your procedure and rest. You can resume normal activities the day after your procedure. 6. The day of your procedure you should not:  Drive  Make legal decisions  Operate machinery  Drink alcohol  Return to work   You may call the office (Dept: [phone]) before 5:00pm, or page the doctor on call [phone]) after 5:00pm, for further instructions, if necessary.   Insurance Information YOU WILL NEED TO CHECK WITH YOUR INSURANCE COMPANY FOR THE BENEFITS OF COVERAGE YOU HAVE FOR THIS PROCEDURE.  UNFORTUNATELY, NOT ALL INSURANCE COMPANIES HAVE BENEFITS TO COVER ALL OR PART OF THESE TYPES OF PROCEDURES.  IT IS YOUR RESPONSIBILITY TO CHECK YOUR BENEFITS, HOWEVER, WE WILL BE GLAD TO ASSIST YOU WITH ANY CODES YOUR INSURANCE COMPANY MAY NEED.    PLEASE NOTE THAT MOST INSURANCE COMPANIES WILL NOT COVER A SCREENING COLONOSCOPY FOR PEOPLE UNDER THE AGE OF 50  IF YOU HAVE BCBS INSURANCE, YOU MAY HAVE BENEFITS FOR A SCREENING COLONOSCOPY BUT IF POLYPS ARE FOUND THE DIAGNOSIS WILL CHANGE AND THEN YOU MAY HAVE A DEDUCTIBLE THAT WILL NEED TO BE MET. SO PLEASE MAKE SURE YOU CHECK YOUR BENEFITS FOR A SCREENING COLONOSCOPY AS WELL AS A DIAGNOSTIC COLONOSCOPY.

## 2018-12-29 NOTE — Addendum Note (Signed)
Addended by: Metro Kung on: 12/29/2018 03:04 PM   Modules accepted: Orders, SmartSet

## 2018-12-29 NOTE — Progress Notes (Signed)
Gastroenterology Pre-Procedure Review  Request Date: 12/29/2018 Requesting Physician: 10 year recall, Last TCS 09/13/2008, no polyps, internal hemorrhoids  PATIENT REVIEW QUESTIONS: The patient responded to the following health history questions as indicated:    1. Diabetes Melitis: yes 2. Joint replacements in the past 12 months: no 3. Major health problems in the past 3 months: no 4. Has an artificial valve or MVP: no 5. Has a defibrillator: no 6. Has been advised in past to take antibiotics in advance of a procedure like teeth cleaning: no 7. Family history of colon cancer: no 8. Alcohol Use: no 9. Illicit drug Use: no 10. History of sleep apnea: no  11. History of coronary artery or other vascular stents placed within the last 12 months: no 12. History of any prior anesthesia complications: no 13. There is no height or weight on file to calculate BMI. ht: 5'2 wt: 147 lbs    MEDICATIONS & ALLERGIES:    Patient reports the following regarding taking any blood thinners:   Plavix? no Aspirin? yes Coumadin? no Brilinta? no Xarelto? no Eliquis? no Pradaxa? no Savaysa? no Effient? no  Patient confirms/reports the following medications:  Current Outpatient Medications  Medication Sig Dispense Refill  . acetaminophen (TYLENOL) 500 MG tablet Take 1,000 mg by mouth as needed.     . Alcohol Swabs (B-D SINGLE USE SWABS REGULAR) PADS USE TO CHECK BLOOD SUGAR TWICE DAILY AND AS NEEDED 300 each 2  . ASPIRIN ADULT LOW STRENGTH 81 MG EC tablet TAKE 1 TABLET EVERY DAY 90 tablet 0  . atorvastatin (LIPITOR) 20 MG tablet Take 1 tablet (20 mg total) by mouth daily. 90 tablet 3  . Blood Glucose Calibration (TRUE METRIX LEVEL 2) Normal SOLN Use as needed with BS machine 1 each 3  . cholecalciferol (VITAMIN D) 25 MCG (1000 UT) tablet TAKE 1 TABLET EVERY DAY 90 tablet 3  . diclofenac sodium (VOLTAREN) 1 % GEL APPLY 2 GRAMS TOPICALLY 4 TIMES DAILY. 100 g 0  . glucose blood (TRUE METRIX BLOOD  GLUCOSE TEST) test strip Test blood sugar twice daily and as needed Dx E11.9 200 each 3  . hydrALAZINE (APRESOLINE) 10 MG tablet Take 1 tablet (10 mg total) by mouth 2 (two) times daily. 180 tablet 3  . JANUMET XR 50-500 MG TB24 Take 1 tablet by mouth 2 (two) times daily. 60 tablet 0  . Liniments (ORTHOGEL) GEL Apply 1 application topically daily as needed (pain).     Marland Kitchen losartan-hydrochlorothiazide (HYZAAR) 50-12.5 MG tablet Take 1 tablet by mouth daily. 90 tablet 3  . metoprolol succinate (TOPROL-XL) 50 MG 24 hr tablet Take 1 tablet (50 mg total) by mouth daily. Take with or immediately following a meal. 90 tablet 3  . nitroGLYCERIN (NITROSTAT) 0.4 MG SL tablet PLACE 1 TABLET (0.4 MG TOTAL) UNDER THE TONGUE EVERY 5 (FIVE) MINUTES AS NEEDED. FOR CHEST PAIN 25 tablet 0  . omeprazole (PRILOSEC) 40 MG capsule Take 1 capsule (40 mg total) by mouth daily. 90 capsule 1  . polyethylene glycol powder (GLYCOLAX/MIRALAX) powder Take 17 g by mouth 2 (two) times daily as needed. 3350 g 1  . predniSONE (STERAPRED UNI-PAK 21 TAB) 10 MG (21) TBPK tablet Use as directed 21 tablet 0  . PRODIGY TWIST TOP LANCETS 28G MISC TEST TWICE DAILY 200 each 2  . diclofenac (VOLTAREN) 75 MG EC tablet Take 1 tablet (75 mg total) by mouth 2 (two) times daily. (Patient not taking: Reported on 12/29/2018) 60 tablet 3  .  Glucerna (GLUCERNA) LIQD Take 237 mLs by mouth 2 (two) times daily between meals. (Patient not taking: Reported on 12/29/2018) 180000 mL 2   No current facility-administered medications for this visit.     Patient confirms/reports the following allergies:  No Known Allergies  No orders of the defined types were placed in this encounter.   AUTHORIZATION INFORMATION Primary Insurance: [facility],  ID #: [id number] Pre-Cert / Berkley Harvey required: No, not required  SCHEDULE INFORMATION: Procedure has been scheduled as follows:  Date: 03/31/2019, Time: 10:30  Location: [facility] with Dr. Darrick Penna  This  Gastroenterology Pre-Precedure Review Form is being routed to the following provider(s): Wynne Dust, NP

## 2018-12-30 ENCOUNTER — Encounter: Payer: Self-pay | Admitting: *Deleted

## 2018-12-30 NOTE — Progress Notes (Signed)
Mailed letter to pt with diabetes medication adjustments.   

## 2018-12-31 ENCOUNTER — Telehealth: Payer: Self-pay | Admitting: *Deleted

## 2018-12-31 ENCOUNTER — Ambulatory Visit (INDEPENDENT_AMBULATORY_CARE_PROVIDER_SITE_OTHER): Payer: Medicare HMO | Admitting: *Deleted

## 2018-12-31 DIAGNOSIS — I10 Essential (primary) hypertension: Secondary | ICD-10-CM | POA: Diagnosis not present

## 2018-12-31 DIAGNOSIS — E785 Hyperlipidemia, unspecified: Secondary | ICD-10-CM | POA: Diagnosis not present

## 2018-12-31 DIAGNOSIS — E1159 Type 2 diabetes mellitus with other circulatory complications: Secondary | ICD-10-CM | POA: Diagnosis not present

## 2018-12-31 DIAGNOSIS — Z748 Other problems related to care provider dependency: Secondary | ICD-10-CM

## 2018-12-31 DIAGNOSIS — E1122 Type 2 diabetes mellitus with diabetic chronic kidney disease: Secondary | ICD-10-CM | POA: Diagnosis not present

## 2018-12-31 DIAGNOSIS — M25511 Pain in right shoulder: Secondary | ICD-10-CM

## 2018-12-31 DIAGNOSIS — N183 Chronic kidney disease, stage 3 unspecified: Secondary | ICD-10-CM

## 2018-12-31 DIAGNOSIS — G8929 Other chronic pain: Secondary | ICD-10-CM

## 2018-12-31 DIAGNOSIS — E1169 Type 2 diabetes mellitus with other specified complication: Secondary | ICD-10-CM | POA: Diagnosis not present

## 2018-12-31 DIAGNOSIS — I2510 Atherosclerotic heart disease of native coronary artery without angina pectoris: Secondary | ICD-10-CM | POA: Diagnosis not present

## 2018-12-31 NOTE — Telephone Encounter (Signed)
12/31/2018   I talked with patient regarding shoulder pain and referral to ortho. She is willing to see one in [place]. Can you put in a new order for ortho and specify [place] location? No need to call patient regarding this telephone encounter because I will f/u with her once the appointment is scheduled. For additional information see CCM note from today.  Chong Sicilian, BSN, RN-BC Embedded Chronic Care Manager [facility] Direct Dial: [phone]

## 2018-12-31 NOTE — Chronic Care Management (AMB) (Signed)
Chronic Care Management   RN CCM Initial Note   12/31/2018 Name: Tonya Carpenter MRN: [id number] DOB: 06/12/1945  Referred by: [name], Elige RadonJoshua A, MD Reason for referral : Chronic Care Management (RN CCM Initial Contact)   Tonya Carpenter is a 73 y.o. year old female who is a primary care patient of [name], Elige RadonJoshua A, MD. The CCM team was consulted for assistance with chronic disease management and care coordination needs.    Review of patient status, including review of consultants reports, relevant laboratory and other test results, and collaboration with appropriate care team members and the patient's provider was performed as part of comprehensive patient evaluation and provision of chronic care management services.    SDOH (Social Determinants of Health) screening performed today: Transportation Stress. See Care Plan for related entries.   Subjective: I spoke with Tonya Carpenter by telephone today. She has been talking with Casimiro NeedleMichael "Scott" Forrest, L[facility] regarding her psychosocial needs and requested to talk with me regarding nursing care needs.   Her primary concern today is that she believes she'll need someone to help her with a colonoscopy prep in January 2021. She is concerned about not being able to follow the directions because of her limited ability to read. She was anxious about the prep and procedure and about transportation to and from the procedure as well. We also discussed her chronic shoulder pain and need for an orthopedic evaluation. It limits her ability to complete chores/tasks around her home and she wants someone to come in and help with those as well. She does have to adult sons that she talks with or sees about every two days but she doesn't want to lean on them for everything because she states that they have their own things to do and take care of.    Objective: Outpatient Encounter Medications as of 12/31/2018  Medication Sig  . acetaminophen  (TYLENOL) 500 MG tablet Take 1,000 mg by mouth as needed.   . Alcohol Swabs (B-D SINGLE USE SWABS REGULAR) PADS USE TO CHECK BLOOD SUGAR TWICE DAILY AND AS NEEDED  . ASPIRIN ADULT LOW STRENGTH 81 MG EC tablet TAKE 1 TABLET EVERY DAY  . atorvastatin (LIPITOR) 20 MG tablet Take 1 tablet (20 mg total) by mouth daily.  . Blood Glucose Calibration (TRUE METRIX LEVEL 2) Normal SOLN Use as needed with BS machine  . cholecalciferol (VITAMIN D) 25 MCG (1000 UT) tablet TAKE 1 TABLET EVERY DAY  . diclofenac (VOLTAREN) 75 MG EC tablet Take 1 tablet (75 mg total) by mouth 2 (two) times daily. (Patient not taking: Reported on 12/29/2018)  . diclofenac sodium (VOLTAREN) 1 % GEL APPLY 2 GRAMS TOPICALLY 4 TIMES DAILY.  Marland Kitchen. Glucerna (GLUCERNA) LIQD Take 237 mLs by mouth 2 (two) times daily between meals. (Patient not taking: Reported on 12/29/2018)  . glucose blood (TRUE METRIX BLOOD GLUCOSE TEST) test strip Test blood sugar twice daily and as needed Dx E11.9  . hydrALAZINE (APRESOLINE) 10 MG tablet Take 1 tablet (10 mg total) by mouth 2 (two) times daily.  Marland Kitchen. JANUMET XR 50-500 MG TB24 Take 1 tablet by mouth 2 (two) times daily.  . Liniments (ORTHOGEL) GEL Apply 1 application topically daily as needed (pain).   Marland Kitchen. losartan-hydrochlorothiazide (HYZAAR) 50-12.5 MG tablet Take 1 tablet by mouth daily.  . metoprolol succinate (TOPROL-XL) 50 MG 24 hr tablet Take 1 tablet (50 mg total) by mouth daily. Take with or immediately following a meal.  .  nitroGLYCERIN (NITROSTAT) 0.4 MG SL tablet PLACE 1 TABLET (0.4 MG TOTAL) UNDER THE TONGUE EVERY 5 (FIVE) MINUTES AS NEEDED. FOR CHEST PAIN  . omeprazole (PRILOSEC) 40 MG capsule Take 1 capsule (40 mg total) by mouth daily.  . polyethylene glycol powder (GLYCOLAX/MIRALAX) powder Take 17 g by mouth 2 (two) times daily as needed.  Marland Kitchen PRODIGY TWIST TOP LANCETS 28G MISC TEST TWICE DAILY   No facility-administered encounter medications on file as of 12/31/2018.     Assessment & Plan:  Goals Addressed            This Visit's Progress     Patient Stated   . In-Home Care (pt-stated)       "I need somebody to help me in my home. I'm here most of the time by myself and I can't take care of everything"  Current Barriers:  Marland Kitchen Knowledge Deficits related to in-home care options . Lacks caregiver support.  . Film/video editor.  . Literacy barriers . Transportation barriers  Nurse Case Manager Clinical Goal(s):  Marland Kitchen Over the next 14 days, patient will work with CCM Team to address needs related to in-home care services  Interventions:  . Collaborated with Theadore Nan, LCSW regarding patient's desire for in-home care services . Provided patient with verbal educational materials related to in-home care services and options with and without full Medicaid coverage . Care Guide referral for research into in-home care service options  Patient Self Care Activities:  . Self administers medications as prescribed . Calls pharmacy for medication refills . Performs ADL's independently . Calls provider office for new concerns or questions . Unable to perform IADLs independently  Initial goal documentation     . Shoulder Pain Management (pt-stated)       Current Barriers:  Marland Kitchen Knowledge Deficits related to shoulder pain management . Lacks caregiver support.  . Film/video editor.  . Literacy barriers . Transportation barriers  Nurse Case Manager Clinical Goal(s):  Marland Kitchen Over the next 7 days, patient will verbalize understanding of plan for evaluation and treatment of shoulder pain.  Interventions:  . Evaluation of current treatment plan related to shoulder pain and patient's adherence to plan as established by provider. . Reviewed medications with patient and discussed diclofenac and tylenol arthritis . Collaborated with Dr [name] regarding new referral to ortho surgeon in [place] . Discussed plans with patient for ongoing care management follow up and provided  patient with direct contact information for care management team . Provided patient and/or caregiver with verbal information about transportation services (community resource).  Patient Self Care Activities:  . Self administers medications as prescribed . Attends all scheduled provider appointments . Calls provider office for new concerns or questions . Unable to independently read, drive, and perform all IADLs  Initial goal documentation     . Transportation Assistance (pt-stated)       "I need help getting to these doctor's appointments"  Current Barriers:  Marland Kitchen Knowledge Deficits related to transportation assistance programs . Lacks caregiver support.  . Film/video editor.  . Literacy barriers . Transportation barriers  Nurse Case Manager Clinical Goal(s):  Marland Kitchen Over the next 14 days, patient will work with RN CCM and LCSW to address needs related to transportation assistance  Interventions:  . Provided patient and/or caregiver with verbal information about transportation assistance Advice worker). . Care Guide referral for transportation assistance  Patient Self Care Activities:  . Self administers medications as prescribed . Calls pharmacy for medication refills . Calls provider office  for new concerns or questions . Unable to perform IADLs independently  Initial goal documentation         Orders Placed This Encounter  Procedures  . Ambulatory referral to Connected Care    Referral Priority:   Routine    Referral Type:   Consultation    Referral Reason:   Specialty Services Required    Number of Visits Requested:   1   Follow-Up Plan: The care management team will reach out to the patient again over the next 7 days.    Demetrios Loll, BSN, RN-BC Embedded Chronic Care Manager [facility] / [facility] Care Management Direct Dial: [phone]

## 2018-12-31 NOTE — Patient Instructions (Signed)
sessment & Plan: Goals Addressed            This Visit's Progress     Patient Stated   . In-Home Care (pt-stated)       "I need somebody to help me in my home. I'm here most of the time by myself and I can't take care of everything"  Current Barriers:  Marland Kitchen Knowledge Deficits related to in-home care options . Lacks caregiver support.  . Corporate treasurer.  . Literacy barriers . Transportation barriers  Nurse Case Manager Clinical Goal(s):  Marland Kitchen Over the next 14 days, patient will work with CCM Team to address needs related to in-home care services  Interventions:  . Collaborated with Lorna Few, LCSW regarding patient's desire for in-home care services . Provided patient with verbal educational materials related to in-home care services and options with and without full Medicaid coverage . Care Guide referral for research into in-home care service options  Patient Self Care Activities:  . Self administers medications as prescribed . Calls pharmacy for medication refills . Performs ADL's independently . Calls provider office for new concerns or questions . Unable to perform IADLs independently  Initial goal documentation     . Shoulder Pain Management (pt-stated)       Current Barriers:  Marland Kitchen Knowledge Deficits related to shoulder pain management . Lacks caregiver support.  . Corporate treasurer.  . Literacy barriers . Transportation barriers  Nurse Case Manager Clinical Goal(s):  Marland Kitchen Over the next 7 days, patient will verbalize understanding of plan for evaluation and treatment of shoulder pain.  Interventions:  . Evaluation of current treatment plan related to shoulder pain and patient's adherence to plan as established by provider. . Reviewed medications with patient and discussed diclofenac and tylenol arthritis . Collaborated with Dr [name] regarding new referral to ortho surgeon in [place] . Discussed plans with patient for ongoing care management follow  up and provided patient with direct contact information for care management team . Provided patient and/or caregiver with verbal information about transportation services (community resource).  Patient Self Care Activities:  . Self administers medications as prescribed . Attends all scheduled provider appointments . Calls provider office for new concerns or questions . Unable to independently read, drive, and perform all IADLs  Initial goal documentation     . Transportation Assistance (pt-stated)       "I need help getting to these doctor's appointments"  Current Barriers:  Marland Kitchen Knowledge Deficits related to transportation assistance programs . Lacks caregiver support.  . Corporate treasurer.  . Literacy barriers . Transportation barriers  Nurse Case Manager Clinical Goal(s):  Marland Kitchen Over the next 14 days, patient will work with RN CCM and LCSW to address needs related to transportation assistance  Interventions:  . Provided patient and/or caregiver with verbal information about transportation assistance Nurse, learning disability). . Care Guide referral for transportation assistance  Patient Self Care Activities:  . Self administers medications as prescribed . Calls pharmacy for medication refills . Calls provider office for new concerns or questions . Unable to perform IADLs independently  Initial goal documentation         Orders Placed This Encounter  Procedures  . Ambulatory referral to Connected Care    Referral Priority:   Routine    Referral Type:   Consultation    Referral Reason:   Specialty Services Required    Number of Visits Requested:   1   Follow-Up Plan: The care management team will reach out to the  patient again over the next 7 days.   Copy of today's visit along with appointment and transportation information will be put up front for patient to pick up once it's available, per her request.  Chong Sicilian, BSN, RN-BC [place] [facility] [place] Management Direct Dial: [phone]

## 2019-01-01 NOTE — Telephone Encounter (Signed)
Go ahead and do a referral to orthopedic, diagnosis shoulder pain

## 2019-01-01 NOTE — Telephone Encounter (Signed)
Referral placed.

## 2019-01-04 ENCOUNTER — Other Ambulatory Visit: Payer: Self-pay | Admitting: Family Medicine

## 2019-01-05 ENCOUNTER — Telehealth: Payer: Self-pay

## 2019-01-05 ENCOUNTER — Ambulatory Visit: Payer: Self-pay | Admitting: *Deleted

## 2019-01-05 DIAGNOSIS — G8929 Other chronic pain: Secondary | ICD-10-CM

## 2019-01-05 DIAGNOSIS — Z748 Other problems related to care provider dependency: Secondary | ICD-10-CM

## 2019-01-05 DIAGNOSIS — M7541 Impingement syndrome of right shoulder: Secondary | ICD-10-CM

## 2019-01-05 DIAGNOSIS — M25511 Pain in right shoulder: Secondary | ICD-10-CM

## 2019-01-05 NOTE — Telephone Encounter (Signed)
01/05/2019 Spoke with patient about [facility] [place] [facility] [place]. Ambrose Mantle [phone]

## 2019-01-05 NOTE — Chronic Care Management (AMB) (Signed)
Chronic Care Management   Follow Up Note   01/05/2019 Name: Tonya Carpenter MRN: [id number] DOB: 14-Jun-1945  Referred by: [name], Fransisca Kaufmann, MD Reason for referral : Chronic Care Management (RN CCM follow up)   Tonya Carpenter is a 73 y.o. year old female who is a primary care patient of [name], Fransisca Kaufmann, MD. The CCM team was consulted for assistance with chronic disease management and care coordination needs.    Review of patient status, including review of consultants reports, relevant laboratory and other test results, and collaboration with appropriate care team members and the patient's provider was performed as part of comprehensive patient evaluation and provision of chronic care management services.    SDOH (Social Determinants of Health) screening performed today: Transportation Stress. See Care Plan for related entries.   I received an incoming call from Tonya Carpenter today. She reached out after speaking with the Care Connection team about transportation assistance. She does not want to pursue the orthopedic appointment for her shoulder pain right now and requested that I cancel the referral.   Goals Addressed            This Visit's Progress     Patient Stated   . Shoulder Pain Management (pt-stated)       Current Barriers:  Marland Kitchen Knowledge Deficits related to shoulder pain management . Lacks caregiver support.  . Film/video editor.  . Literacy barriers . Transportation barriers  Nurse Case Manager Clinical Goal(s):  Marland Kitchen Over the next 30 days, patient will reach out to PCP office if shoulder pain worsens or persists  Interventions:  . Received incoming call from patient requesting to cancel orthopedic referral stating that her shoulder pain has improved and that she will follow-up with PCP office if it worsens . Encouraged to reach out to PCP office if shoulder pain worsens or persists . Reiterated that we can help with transportation arrangement since that is a concern   Patient Self Care Activities:  . Self administers medications as prescribed . Attends all scheduled provider appointments . Calls provider office for new concerns or questions . Unable to independently read, drive, and perform all IADLs  Please see past updates related to this goal by clicking on the "Past Updates" button in the selected goal      . Transportation Assistance (pt-stated)       "I need help getting to these doctor's appointments"  Current Barriers:  Marland Kitchen Knowledge Deficits related to transportation assistance programs . Lacks caregiver support.  . Film/video editor.  . Literacy barriers . Transportation barriers  Nurse Case Manager Clinical Goal(s):  Marland Kitchen Over the next 30 days, patient will work with CCM Team as needed to address needs related to transportation assistance  Interventions:  . Chart reviewed . Spoke with patient by phone. She was offered information on transportation services through [employer] but she declines their assistance at present . Encouraged her to reach out to RN CCM [phone] or to [employer] directly to arrange transportation as needed  Patient Self Care Activities:  . Self administers medications as prescribed . Calls pharmacy for medication refills . Calls provider office for new concerns or questions . Unable to perform IADLs independently  Please see past updates related to this goal by clicking on the "Past Updates" button in the selected goal          The care management team will reach out to the patient again over the next 30 days.   Chong Sicilian, BSN, RN-BC  [place] / [facility] Care Management Direct Dial: [phone]

## 2019-01-05 NOTE — Patient Instructions (Signed)
Visit Information  Goals Addressed            This Visit's Progress     Patient Stated   . Shoulder Pain Management (pt-stated)       Current Barriers:  Marland Kitchen Knowledge Deficits related to shoulder pain management . Lacks caregiver support.  . Film/video editor.  . Literacy barriers . Transportation barriers  Nurse Case Manager Clinical Goal(s):  Marland Kitchen Over the next 30 days, patient will reach out to PCP office if shoulder pain worsens or persists  Interventions:  . Received incoming call from patient requesting to cancel orthopedic referral stating that her shoulder pain has improved and that she will follow-up with PCP office if it worsens . Encouraged to reach out to PCP office if shoulder pain worsens or persists . Reiterated that we can help with transportation arrangement since that is a concern  Patient Self Care Activities:  . Self administers medications as prescribed . Attends all scheduled provider appointments . Calls provider office for new concerns or questions . Unable to independently read, drive, and perform all IADLs  Please see past updates related to this goal by clicking on the "Past Updates" button in the selected goal      . Transportation Assistance (pt-stated)       "I need help getting to these doctor's appointments"  Current Barriers:  Marland Kitchen Knowledge Deficits related to transportation assistance programs . Lacks caregiver support.  . Film/video editor.  . Literacy barriers . Transportation barriers  Nurse Case Manager Clinical Goal(s):  Marland Kitchen Over the next 30 days, patient will work with CCM Team as needed to address needs related to transportation assistance  Interventions:  . Chart reviewed . Spoke with patient by phone. She was offered information on transportation services through [employer] but she declines their assistance at present . Encouraged her to reach out to RN CCM [phone] or to [employer] directly to arrange transportation as  needed  Patient Self Care Activities:  . Self administers medications as prescribed . Calls pharmacy for medication refills . Calls provider office for new concerns or questions . Unable to perform IADLs independently  Please see past updates related to this goal by clicking on the "Past Updates" button in the selected goal         The patient verbalized understanding of instructions provided today and declined a print copy of patient instruction materials.   The care management team will reach out to the patient again over the next 30 days.    Chong Sicilian, BSN, RN-BC Embedded Chronic Care Manager [facility] [place] [facility] Direct Dial: [phone]

## 2019-01-12 ENCOUNTER — Ambulatory Visit: Payer: Medicare HMO | Admitting: Licensed Clinical Social Worker

## 2019-01-12 DIAGNOSIS — E1122 Type 2 diabetes mellitus with diabetic chronic kidney disease: Secondary | ICD-10-CM | POA: Diagnosis not present

## 2019-01-12 DIAGNOSIS — R42 Dizziness and giddiness: Secondary | ICD-10-CM

## 2019-01-12 DIAGNOSIS — E1159 Type 2 diabetes mellitus with other circulatory complications: Secondary | ICD-10-CM

## 2019-01-12 DIAGNOSIS — I2510 Atherosclerotic heart disease of native coronary artery without angina pectoris: Secondary | ICD-10-CM

## 2019-01-12 DIAGNOSIS — N183 Chronic kidney disease, stage 3 unspecified: Secondary | ICD-10-CM | POA: Diagnosis not present

## 2019-01-12 DIAGNOSIS — I10 Essential (primary) hypertension: Secondary | ICD-10-CM

## 2019-01-12 DIAGNOSIS — E1169 Type 2 diabetes mellitus with other specified complication: Secondary | ICD-10-CM | POA: Diagnosis not present

## 2019-01-12 DIAGNOSIS — E785 Hyperlipidemia, unspecified: Secondary | ICD-10-CM

## 2019-01-12 DIAGNOSIS — M7541 Impingement syndrome of right shoulder: Secondary | ICD-10-CM

## 2019-01-12 NOTE — Patient Instructions (Addendum)
Licensed Clinical Social Worker Visit Information  Goals we discussed today:  Goals    . Client wants to talk with LCSW about mannaging streess issues faced (pt-stated)     Current Barriers:  . Financial challenges . Transport challenge  Clinical Social Work Clinical Goal(s):  . Client to talk with LCSW in next 30 days about client managing stress issues  Interventions: . Talked with client about CCM services . Talked with client about support with [facility] for nursing needs . Talked about Food Stamps benefit . Talked about [place] support   Talked with client about pain issues of client  Talked with client about financial needs of client . Talked with client about transportation needs of client  Patient Self Care Activities:  . Takes medications as prescribed . Attends scheduled medical appointments  Plan: Client to attend medical appointments LCSW to call client in next 3 weeks to discuss psychosocial needs of client Client to talk with [facility] about resources   Initial goal documentation        Materials Provided:  No  Follow Up Plan:  LCSW to call client in next 3 weeks to talk with client about the psychosocial needs of client at that time  The patient verbalized understanding of instructions provided today and declined a print copy of patient instruction materials.   [name].Talaya Lamprecht MSW, LCSW Licensed Clinical Social Worker [facility] Care Management [phone]

## 2019-01-12 NOTE — Chronic Care Management (AMB) (Signed)
Care Management Note   Tonya Carpenter is a 73 y.o. year old female who is a primary care patient of [name], Fransisca Kaufmann, MD. The CM team was consulted for assistance with chronic disease management and care coordination.   I reached out to [place] by phone today.   Review of patient status, including review of consultants reports, relevant laboratory and other test results, and collaboration with appropriate care team members and the patient's provider was performed as part of comprehensive patient evaluation and provision of chronic care management services.    Social determinants of health: risk of social isolation; risk of stress; risk of transportation  needs    Office Visit from 12/03/2018 in [place]  PHQ-9 Total Score  6     GAD 7 : Generalized Anxiety Score 10/07/2018  Nervous, Anxious, on Edge 1  Control/stop worrying 1  Worry too much - different things 0  Trouble relaxing 1  Restless 0  Easily annoyed or irritable 0  Afraid - awful might happen 0  Total GAD 7 Score 3  Anxiety Difficulty Somewhat difficult   Medications   New medications from outside sources are available for reconciliation   acetaminophen (TYLENOL) 500 MG tablet    Alcohol Swabs (B-D SINGLE USE SWABS REGULAR) PADS    atorvastatin (LIPITOR) 20 MG tablet    Blood Glucose Calibration (TRUE METRIX LEVEL 2) Normal SOLN    cholecalciferol (VITAMIN D) 25 MCG (1000 UT) tablet    diclofenac (VOLTAREN) 75 MG EC tablet    diclofenac sodium (VOLTAREN) 1 % GEL    Glucerna (GLUCERNA) LIQD    glucose blood (TRUE METRIX BLOOD GLUCOSE TEST) test strip    GNP ASPIRIN LOW DOSE 81 MG EC tablet    hydrALAZINE (APRESOLINE) 10 MG tablet    JANUMET XR 50-500 MG TB24    Liniments (ORTHOGEL) GEL    losartan-hydrochlorothiazide (HYZAAR) 50-12.5 MG tablet    metoprolol succinate (TOPROL-XL) 50 MG 24 hr tablet(Expired)    nitroGLYCERIN (NITROSTAT) 0.4 MG SL tablet    omeprazole (PRILOSEC) 40 MG  capsule    polyethylene glycol powder (GLYCOLAX/MIRALAX) powder    predniSONE (STERAPRED UNI-PAK 21 TAB) 10 MG (21) TBPK tablet    PRODIGY TWIST TOP LANCETS 28G MISC      Goals    . Client wants to talk with LCSW about mannaging streess issues faced (pt-stated)     Current Barriers:  . Financial challenges . Transport challenge  Clinical Social Work Clinical Goal(s):  . Client to talk with LCSW in next 30 days about client managing stress issues  Interventions: . Talked with client about CCM services . Talked with client about support with [facility] for nursing needs . Talked about Food Stamps benefit . Talked about [place] support . Talked with client about pain issues of client . Talked with client about financial needs of client . Talked with client about transportation needs of client  Patient Self Care Activities:  . Takes medications as prescribed . Attends scheduled medical appointments  Plan: Client to attend medical appointments LCSW to call client in next 3 weeks to discuss psychosocial needs of client Client to talk with [facility] about resources      Initial goal documentation       Follow Up Plan: LCSW to call client in next 3 weeks to talk with client about the psychosocial needs of client  [name].Arlita Buffkin MSW, LCSW Licensed Holiday representative [facility] Care Management [phone]

## 2019-01-18 ENCOUNTER — Telehealth: Payer: Self-pay | Admitting: Family Medicine

## 2019-01-18 ENCOUNTER — Telehealth: Payer: Self-pay

## 2019-01-18 NOTE — Telephone Encounter (Signed)
01/18/2019 Spoke with patient about in-home aide and she is on a waiting list, patient knows to call [facility] for transportation for her medical appointments. Ambrose Mantle [phone]

## 2019-01-18 NOTE — Telephone Encounter (Signed)
Refer to previous note- returned patient's call. She is aware she is on waiting list.

## 2019-01-21 ENCOUNTER — Other Ambulatory Visit: Payer: Self-pay | Admitting: Family Medicine

## 2019-02-02 ENCOUNTER — Ambulatory Visit (INDEPENDENT_AMBULATORY_CARE_PROVIDER_SITE_OTHER): Payer: Medicare HMO | Admitting: Licensed Clinical Social Worker

## 2019-02-02 DIAGNOSIS — M7541 Impingement syndrome of right shoulder: Secondary | ICD-10-CM

## 2019-02-02 DIAGNOSIS — E1122 Type 2 diabetes mellitus with diabetic chronic kidney disease: Secondary | ICD-10-CM

## 2019-02-02 DIAGNOSIS — E1159 Type 2 diabetes mellitus with other circulatory complications: Secondary | ICD-10-CM | POA: Diagnosis not present

## 2019-02-02 DIAGNOSIS — E1169 Type 2 diabetes mellitus with other specified complication: Secondary | ICD-10-CM | POA: Diagnosis not present

## 2019-02-02 DIAGNOSIS — I10 Essential (primary) hypertension: Secondary | ICD-10-CM | POA: Diagnosis not present

## 2019-02-02 DIAGNOSIS — I2510 Atherosclerotic heart disease of native coronary artery without angina pectoris: Secondary | ICD-10-CM

## 2019-02-02 DIAGNOSIS — E785 Hyperlipidemia, unspecified: Secondary | ICD-10-CM

## 2019-02-02 DIAGNOSIS — N183 Chronic kidney disease, stage 3 unspecified: Secondary | ICD-10-CM | POA: Diagnosis not present

## 2019-02-02 DIAGNOSIS — R42 Dizziness and giddiness: Secondary | ICD-10-CM

## 2019-02-02 NOTE — Chronic Care Management (AMB) (Signed)
Care Management Note   Tonya Carpenter is a 73 y.o. year old female who is a primary care patient of [name], Fransisca Kaufmann, MD. The CM team was consulted for assistance with chronic disease management and care coordination.   I reached out to [place] by phone today.   Review of patient status, including review of consultants reports, relevant laboratory and other test results, and collaboration with appropriate care team members and the patient's provider was performed as part of comprehensive patient evaluation and provision of chronic care management services.   Social determinants of health: risk of social isolation; risk of stress; risk of transport needs    Office Visit from 12/03/2018 in [place]  PHQ-9 Total Score  6     GAD 7 : Generalized Anxiety Score 10/07/2018  Nervous, Anxious, on Edge 1  Control/stop worrying 1  Worry too much - different things 0  Trouble relaxing 1  Restless 0  Easily annoyed or irritable 0  Afraid - awful might happen 0  Total GAD 7 Score 3  Anxiety Difficulty Somewhat difficult   Medications   New medications from outside sources are available for reconciliation   acetaminophen (TYLENOL) 500 MG tablet    Alcohol Swabs (B-D SINGLE USE SWABS REGULAR) PADS    atorvastatin (LIPITOR) 20 MG tablet    Blood Glucose Calibration (TRUE METRIX LEVEL 2) Normal SOLN    cholecalciferol (VITAMIN D) 25 MCG (1000 UT) tablet    diclofenac (VOLTAREN) 75 MG EC tablet    diclofenac sodium (VOLTAREN) 1 % GEL    Glucerna (GLUCERNA) LIQD    glucose blood (TRUE METRIX BLOOD GLUCOSE TEST) test strip    GNP ASPIRIN LOW DOSE 81 MG EC tablet    hydrALAZINE (APRESOLINE) 10 MG tablet    JANUMET XR 50-500 MG TB24    Liniments (ORTHOGEL) GEL    losartan-hydrochlorothiazide (HYZAAR) 50-12.5 MG tablet    metoprolol succinate (TOPROL-XL) 50 MG 24 hr tablet(Expired)    nitroGLYCERIN (NITROSTAT) 0.4 MG SL tablet    omeprazole (PRILOSEC) 40 MG capsule     polyethylene glycol powder (GLYCOLAX/MIRALAX) powder    predniSONE (STERAPRED UNI-PAK 21 TAB) 10 MG (21) TBPK tablet    PRODIGY TWIST TOP LANCETS 28G MISC    Goals    . Client wants to talk with LCSW about mannaging stress issues faced (pt-stated)     Current Barriers:  . Financial challenges . Transport challenge  Clinical Social Work Clinical Goal(s):  . Client to talk with LCSW in next 30 days about client managing stress issues  Interventions: . Talked with client about support with [facility] for nursing needs . Talked about Food Stamps benefit . Talked about [place] support . Talked with client about pain issues of client . Talked with client about upcoming client appointments  . Talked with client about home health needs of client . Collaborated with RNCM to discuss nursing needs of client  Patient Self Care Activities:  . Takes medications as prescribed . Attends scheduled medical appointments  Plan: Client to attend medical appointments LCSW to call client in next 3 weeks to discuss psychosocial needs of client Client to talk with [facility] about resources   Initial goal documentation      Follow Up Plan: LCSW to call client in next 3 weeks to talk with client about the psychosocial needs of client at that time  [name].Akane Tessier MSW, [employer] Licensed Holiday representative [place]  [facility] Care Management [phone]

## 2019-02-02 NOTE — Patient Instructions (Addendum)
Licensed Clinical Social Worker Visit Information  Goals we discussed today:  Goals    . Client wants to talk with LCSW about mannaging streess issues faced (pt-stated)     Current Barriers:  . Financial challenges . Transport challenge  Clinical Social Work Clinical Goal(s):  . Client to talk with LCSW in next 30 days about client managing stress issues  Interventions:   Talked with client about support with RNCM for nursing needs  Talked about Food Stamps benefit  Talked about [place] support  Talked with client about pain issues of client  Talked with client about upcoming client appointments   Talked with client about home health needs of client  Collaborated with RNCM to discuss nursing needs of client  Patient Self Care Activities:  . Takes medications as prescribed . Attends scheduled medical appointments  Plan: Client to attend medical appointments LCSW to call client in next 3 weeks to discuss psychosocial needs of client Client to talk with [facility] about resources   Initial goal documentation         Materials Provided: No  Follow Up Plan: LCSW to call client in next 3 weeks to discuss the psychosocial needs of client at that time  The patient verbalized understanding of instructions provided today and declined a print copy of patient instruction materials.   [name].Alyric Parkin MSW, LCSW Licensed Clinical Social Worker [facility] Care Management [phone]

## 2019-02-18 ENCOUNTER — Other Ambulatory Visit: Payer: Self-pay | Admitting: Family Medicine

## 2019-02-18 DIAGNOSIS — E1122 Type 2 diabetes mellitus with diabetic chronic kidney disease: Secondary | ICD-10-CM

## 2019-02-24 ENCOUNTER — Ambulatory Visit (INDEPENDENT_AMBULATORY_CARE_PROVIDER_SITE_OTHER): Payer: Medicare HMO | Admitting: Licensed Clinical Social Worker

## 2019-02-24 DIAGNOSIS — N183 Chronic kidney disease, stage 3 unspecified: Secondary | ICD-10-CM | POA: Diagnosis not present

## 2019-02-24 DIAGNOSIS — E785 Hyperlipidemia, unspecified: Secondary | ICD-10-CM

## 2019-02-24 DIAGNOSIS — E1122 Type 2 diabetes mellitus with diabetic chronic kidney disease: Secondary | ICD-10-CM | POA: Diagnosis not present

## 2019-02-24 DIAGNOSIS — I10 Essential (primary) hypertension: Secondary | ICD-10-CM

## 2019-02-24 DIAGNOSIS — E1159 Type 2 diabetes mellitus with other circulatory complications: Secondary | ICD-10-CM | POA: Diagnosis not present

## 2019-02-24 DIAGNOSIS — E1169 Type 2 diabetes mellitus with other specified complication: Secondary | ICD-10-CM | POA: Diagnosis not present

## 2019-02-24 DIAGNOSIS — I2510 Atherosclerotic heart disease of native coronary artery without angina pectoris: Secondary | ICD-10-CM | POA: Diagnosis not present

## 2019-02-24 NOTE — Patient Instructions (Addendum)
Licensed Clinical Social Worker Visit Information  Goals we discussed today:  Goals    . Client wants to talk with LCSW about mannaging streess issues faced (pt-stated)     Current Barriers:    Stress issues faced by client with chronic diagnoses of CAD, GERD,Type II DM, HTN, and Hyperlipidemia . Financial challenges . Transport challenge  Clinical Social Work Clinical Goal(s):  . Client to talk with LCSW in next 30 days about client managing stress issues  Interventions:  Talked with client about support with RNCM for nursing needs   Talked with client about [place] support  Talked with client about Food Stamps benefit  Talked with client about pain issues of client  Talked previously with client about home health needs of client  Patient Self Care Activities:  . Takes medications as prescribed . Attends scheduled medical appointments   Plan: Client to attend medical appointments LCSW to call client in next 4 weeks to discuss psychosocial needs of client Client to talk with [facility] about resources   Initial goal documentation         Materials Provided: No  Follow Up Plan:  LCSW to call client in next 4 weeks to discuss the psychosocial needs of client at that time  The patient verbalized understanding of instructions provided today and declined a print copy of patient instruction materials.   [name].Jasdeep Dejarnett MSW, LCSW Licensed Clinical Social Worker [facility] Care Management [phone]

## 2019-02-24 NOTE — Chronic Care Management (AMB) (Signed)
Care Management Note   Tonya Carpenter is a 73 y.o. year old female who is a primary care patient of [name], Fransisca Kaufmann, MD. The CM team was consulted for assistance with chronic disease management and care coordination.   I reached out to [place] by phone today.    Review of patient status, including review of consultants reports, relevant laboratory and other test results, and collaboration with appropriate care team members and the patient's provider was performed as part of comprehensive patient evaluation and provision of chronic care management services.   Social determinants of health: risk of social isolation; risk of stress; risk of transport needs    Office Visit from 12/03/2018 in [facility]  PHQ-9 Total Score  6     GAD 7 : Generalized Anxiety Score 10/07/2018  Nervous, Anxious, on Edge 1  Control/stop worrying 1  Worry too much - different things 0  Trouble relaxing 1  Restless 0  Easily annoyed or irritable 0  Afraid - awful might happen 0  Total GAD 7 Score 3  Anxiety Difficulty Somewhat difficult   Medications   New medications from outside sources are available for reconciliation   acetaminophen (TYLENOL) 500 MG tablet    Alcohol Swabs (B-D SINGLE USE SWABS REGULAR) PADS    atorvastatin (LIPITOR) 20 MG tablet    Blood Glucose Calibration (TRUE METRIX LEVEL 2) Normal SOLN    cholecalciferol (VITAMIN D) 25 MCG (1000 UT) tablet    diclofenac (VOLTAREN) 75 MG EC tablet    diclofenac sodium (VOLTAREN) 1 % GEL    Glucerna (GLUCERNA) LIQD    glucose blood (PRODIGY NO CODING BLOOD GLUC) test strip    GNP ASPIRIN LOW DOSE 81 MG EC tablet    hydrALAZINE (APRESOLINE) 10 MG tablet    JANUMET XR 50-500 MG TB24    Liniments (ORTHOGEL) GEL    losartan-hydrochlorothiazide (HYZAAR) 50-12.5 MG tablet    metoprolol succinate (TOPROL-XL) 50 MG 24 hr tablet(Expired)    nitroGLYCERIN (NITROSTAT) 0.4 MG SL tablet    omeprazole (PRILOSEC) 40 MG capsule     polyethylene glycol powder (GLYCOLAX/MIRALAX) powder    predniSONE (STERAPRED UNI-PAK 21 TAB) 10 MG (21) TBPK tablet    Prodigy Twist Top Lancets 28G MISC    Goals    . Client wants to talk with LCSW about mannaging streess issues faced (pt-stated)     Current Barriers:  . Stress issues faced by client with chronic diagnoses of CAD, GERD,Type II DM, HTN, and Hyperlipidemia . Financial challenges . Transport challenge  Clinical Social Work Clinical Goal(s):  . Client to talk with LCSW in next 30 days about client managing stress issues  Interventions: . Talked with client about support with RNCM for nursing needs   Talked with client about [place] support  Talked with client about Food Stamps benefit  Talked with client about pain issues of client  Talked previously with client about home health needs of client  Patient Self Care Activities:  . Takes medications as prescribed . Attends scheduled medical appointments  Plan: Client to attend medical appointments LCSW to call client in next 4 weeks to discuss psychosocial needs of client Client to talk with [facility] about resources  Initial goal documentation       Follow Up Plan: LCSW to call client in next 4 weeks to talk with client about the psychosocial needs of client at that time  [name].Maximiano Lott MSW, [employer] Licensed  Clinical Social Worker [facility] Care Management [phone] 

## 2019-03-08 ENCOUNTER — Ambulatory Visit (INDEPENDENT_AMBULATORY_CARE_PROVIDER_SITE_OTHER): Payer: Medicare HMO | Admitting: Family Medicine

## 2019-03-08 ENCOUNTER — Encounter: Payer: Self-pay | Admitting: Family Medicine

## 2019-03-08 DIAGNOSIS — E1122 Type 2 diabetes mellitus with diabetic chronic kidney disease: Secondary | ICD-10-CM | POA: Diagnosis not present

## 2019-03-08 DIAGNOSIS — K2100 Gastro-esophageal reflux disease with esophagitis, without bleeding: Secondary | ICD-10-CM

## 2019-03-08 DIAGNOSIS — I10 Essential (primary) hypertension: Secondary | ICD-10-CM | POA: Diagnosis not present

## 2019-03-08 DIAGNOSIS — G8929 Other chronic pain: Secondary | ICD-10-CM | POA: Diagnosis not present

## 2019-03-08 DIAGNOSIS — N183 Chronic kidney disease, stage 3 unspecified: Secondary | ICD-10-CM | POA: Diagnosis not present

## 2019-03-08 DIAGNOSIS — E1169 Type 2 diabetes mellitus with other specified complication: Secondary | ICD-10-CM

## 2019-03-08 DIAGNOSIS — E1159 Type 2 diabetes mellitus with other circulatory complications: Secondary | ICD-10-CM | POA: Diagnosis not present

## 2019-03-08 DIAGNOSIS — M25511 Pain in right shoulder: Secondary | ICD-10-CM | POA: Diagnosis not present

## 2019-03-08 DIAGNOSIS — E785 Hyperlipidemia, unspecified: Secondary | ICD-10-CM | POA: Diagnosis not present

## 2019-03-08 DIAGNOSIS — I152 Hypertension secondary to endocrine disorders: Secondary | ICD-10-CM

## 2019-03-08 MED ORDER — JANUMET XR 50-500 MG PO TB24: 1.0000 | ORAL_TABLET | Freq: Two times a day (BID) | ORAL | 3 refills | Status: DC

## 2019-03-08 MED ORDER — DICLOFENAC SODIUM 75 MG PO TBEC: 75.0000 mg | DELAYED_RELEASE_TABLET | Freq: Two times a day (BID) | ORAL | 5 refills | Status: DC

## 2019-03-08 NOTE — Progress Notes (Signed)
Virtual Visit via telephone Note  I connected with Tonya Carpenter on 03/08/19 at 1045 by telephone and verified that I am speaking with the correct person using two identifiers. Tonya Carpenter is currently located at home and son are currently with her during visit. The provider, Fransisca Kaufmann [name], MD is located in their office at time of visit.  Call ended at 1104  I discussed the limitations, risks, security and privacy concerns of performing an evaluation and management service by telephone and the availability of in person appointments. I also discussed with the patient that there may be a patient responsible charge related to this service. The patient expressed understanding and agreed to proceed.   History and Present Illness: Type 2 diabetes mellitus Patient comes in today for recheck of his diabetes. Patient has been currently taking janumet and was 133 today. Patient is currently on an ACE inhibitor/ARB. Patient has not seen an ophthalmologist this year. Patient denies any issues with their feet.   Hypertension Patient is currently on hydralazine and losartan-hctz and metoprolol, and their blood pressure today is unknown. Patient denies any lightheadedness or dizziness. Patient denies headaches, blurred vision, chest pains, shortness of breath, or weakness. Denies any side effects from medication and is content with current medication.   Hyperlipidemia Patient is coming in for recheck of his hyperlipidemia. The patient is currently taking atorvastatin. They deny any issues with myalgias or history of liver damage from it. They deny any focal numbness or weakness or chest pain.   GERD Patient is currently on omeprazole.  She denies any major symptoms or abdominal pain or belching or burping. She denies any blood in her stool or lightheadedness or dizziness.     Outpatient Encounter Medications as of 03/08/2019  Medication Sig  . acetaminophen (TYLENOL) 500 MG tablet Take 1,000 mg by  mouth as needed.   . Alcohol Swabs (B-D SINGLE USE SWABS REGULAR) PADS USE TO CHECK BLOOD SUGAR TWICE DAILY AND AS NEEDED Dx E11.9  . atorvastatin (LIPITOR) 20 MG tablet Take 1 tablet (20 mg total) by mouth daily.  . Blood Glucose Calibration (TRUE METRIX LEVEL 2) Normal SOLN Use as needed with BS machine  . cholecalciferol (VITAMIN D) 25 MCG (1000 UT) tablet TAKE 1 TABLET EVERY DAY  . diclofenac (VOLTAREN) 75 MG EC tablet Take 1 tablet (75 mg total) by mouth 2 (two) times daily.  Marland Kitchen glucose blood (PRODIGY NO CODING BLOOD GLUC) test strip TEST BLOOD SUGAR TWICE DAILY Dx E11.9  . GNP ASPIRIN LOW DOSE 81 MG EC tablet TAKE 1 TABLET EVERY DAY  . hydrALAZINE (APRESOLINE) 10 MG tablet Take 1 tablet (10 mg total) by mouth 2 (two) times daily.  Marland Kitchen JANUMET XR 50-500 MG TB24 Take 1 tablet by mouth 2 (two) times daily.  . Liniments (ORTHOGEL) GEL Apply 1 application topically daily as needed (pain).   Marland Kitchen losartan-hydrochlorothiazide (HYZAAR) 50-12.5 MG tablet Take 1 tablet by mouth daily.  . metoprolol succinate (TOPROL-XL) 50 MG 24 hr tablet Take 1 tablet (50 mg total) by mouth daily. Take with or immediately following a meal.  . nitroGLYCERIN (NITROSTAT) 0.4 MG SL tablet PLACE 1 TABLET (0.4 MG TOTAL) UNDER THE TONGUE EVERY 5 (FIVE) MINUTES AS NEEDED. FOR CHEST PAIN  . omeprazole (PRILOSEC) 40 MG capsule Take 1 capsule (40 mg total) by mouth daily.  . polyethylene glycol powder (GLYCOLAX/MIRALAX) powder Take 17 g by mouth 2 (two) times daily as needed.  . Prodigy Twist Top Lancets 28G  MISC TEST TWICE DAILY Dx E11.9  . [DISCONTINUED] diclofenac (VOLTAREN) 75 MG EC tablet Take 1 tablet (75 mg total) by mouth 2 (two) times daily. (Patient not taking: Reported on 12/29/2018)  . [DISCONTINUED] diclofenac sodium (VOLTAREN) 1 % GEL APPLY 2 GRAMS TOPICALLY 4 TIMES DAILY.  . [DISCONTINUED] Glucerna (GLUCERNA) LIQD Take 237 mLs by mouth 2 (two) times daily between meals. (Patient not taking: Reported on 12/29/2018)  .  [DISCONTINUED] JANUMET XR 50-500 MG TB24 Take 1 tablet by mouth 2 (two) times daily.  . [DISCONTINUED] predniSONE (STERAPRED UNI-PAK 21 TAB) 10 MG (21) TBPK tablet Use as directed   No facility-administered encounter medications on file as of 03/08/2019.    Review of Systems  Constitutional: Negative for chills and fever.  Eyes: Negative for visual disturbance.  Respiratory: Negative for chest tightness and shortness of breath.   Cardiovascular: Negative for chest pain and leg swelling.  Musculoskeletal: Negative for back pain and gait problem.  Skin: Negative for rash.  Neurological: Negative for dizziness, light-headedness and headaches.  Psychiatric/Behavioral: Negative for agitation, behavioral problems, self-injury, sleep disturbance and suicidal ideas. The patient is not nervous/anxious.   All other systems reviewed and are negative.   Observations/Objective: Patient sounds comfortable and in no acute distress  Assessment and Plan: Problem List Items Addressed This Visit      Cardiovascular and Mediastinum   Hypertension associated with diabetes (HCC) - Primary   Relevant Medications   JANUMET XR 50-500 MG TB24     Digestive   GERD (gastroesophageal reflux disease)     Endocrine   Type 2 diabetes mellitus (HCC)   Relevant Medications   JANUMET XR 50-500 MG TB24   Hyperlipidemia associated with type 2 diabetes mellitus (HCC)   Relevant Medications   JANUMET XR 50-500 MG TB24    Other Visit Diagnoses    Chronic right shoulder pain       Relevant Medications   diclofenac (VOLTAREN) 75 MG EC tablet   Type 2 diabetes mellitus with stage 3 chronic kidney disease, without long-term current use of insulin (HCC)       Relevant Medications   JANUMET XR 50-500 MG TB24      Continue current medication, no change, continue voltaren.  Follow up plan: Return in about 3 months (around 06/06/2019), or if symptoms worsen or fail to improve, for diabetes and htn and hld.      I discussed the assessment and treatment plan with the patient. The patient was provided an opportunity to ask questions and all were answered. The patient agreed with the plan and demonstrated an understanding of the instructions.   The patient was advised to call back or seek an in-person evaluation if the symptoms worsen or if the condition fails to improve as anticipated.  The above assessment and management plan was discussed with the patient. The patient verbalized understanding of and has agreed to the management plan. Patient is aware to call the clinic if symptoms persist or worsen. Patient is aware when to return to the clinic for a follow-up visit. Patient educated on when it is appropriate to go to the emergency department.    I provided 19 minutes of non-face-to-face time during this encounter.    Nils Pyle, MD

## 2019-03-15 ENCOUNTER — Other Ambulatory Visit: Payer: Self-pay | Admitting: *Deleted

## 2019-03-15 MED ORDER — PRODIGY AUTOCODE BLOOD GLUCOSE DEVI: 0 refills | Status: DC

## 2019-03-17 ENCOUNTER — Telehealth: Payer: Self-pay | Admitting: *Deleted

## 2019-03-17 DIAGNOSIS — E1169 Type 2 diabetes mellitus with other specified complication: Secondary | ICD-10-CM

## 2019-03-17 NOTE — Telephone Encounter (Signed)
Fax from [place] alternative for Prodigy AutoCode blood glucose Covered formulary: [employer] 14 day Reader PA required [place]

## 2019-03-22 ENCOUNTER — Telehealth: Payer: Self-pay | Admitting: *Deleted

## 2019-03-22 NOTE — Telephone Encounter (Signed)
Spoke with pt and tried to go over prep instructions with pt.  She said that she did not understand anything that I was saying.  She said she needed someone there to help her.  She gave me verbal permission to go over prep instructions with Malarie Tappen [phone]).   Spoke to [place] and she said that she is going to help pt with her prep instructions.  She said that she will also take her to pick up her prep kit and other items needed for her colonoscopy.

## 2019-03-23 MED ORDER — FREESTYLE LIBRE 14 DAY SENSOR MISC: 1.0000 | 3 refills | Status: DC

## 2019-03-23 MED ORDER — FREESTYLE LIBRE 14 DAY READER DEVI: 1.0000 | Freq: Four times a day (QID) | 3 refills | Status: DC

## 2019-03-23 NOTE — Telephone Encounter (Signed)
I do not understand, it is saying that it covers the freestyle libre or it covers Prodigy auto code, the Prodigy auto code is just a normal test strip meter that talks, I do not think that is necessary unless the patient wants that.  If it covers the freestyle libre then that is what we are trying to get for her. Arville Care, MD [facility] 03/23/2019, 1:48 PM

## 2019-03-23 NOTE — Telephone Encounter (Signed)
I sent the freestyle libre for the patient,  Tonya Care, MD [facility] 03/23/2019, 2:53 PM

## 2019-03-23 NOTE — Telephone Encounter (Signed)
The Prodigy autocode is what was ordered. It looks like the covered alternative is [employer]

## 2019-03-23 NOTE — Addendum Note (Signed)
Addended by: Arville Care on: 03/23/2019 02:53 PM   Modules accepted: Orders

## 2019-03-24 ENCOUNTER — Ambulatory Visit (INDEPENDENT_AMBULATORY_CARE_PROVIDER_SITE_OTHER): Payer: Medicare HMO | Admitting: Licensed Clinical Social Worker

## 2019-03-24 DIAGNOSIS — E785 Hyperlipidemia, unspecified: Secondary | ICD-10-CM

## 2019-03-24 DIAGNOSIS — N183 Chronic kidney disease, stage 3 unspecified: Secondary | ICD-10-CM

## 2019-03-24 DIAGNOSIS — I10 Essential (primary) hypertension: Secondary | ICD-10-CM

## 2019-03-24 DIAGNOSIS — I2510 Atherosclerotic heart disease of native coronary artery without angina pectoris: Secondary | ICD-10-CM

## 2019-03-24 DIAGNOSIS — E1122 Type 2 diabetes mellitus with diabetic chronic kidney disease: Secondary | ICD-10-CM

## 2019-03-24 DIAGNOSIS — E1169 Type 2 diabetes mellitus with other specified complication: Secondary | ICD-10-CM

## 2019-03-24 DIAGNOSIS — E1159 Type 2 diabetes mellitus with other circulatory complications: Secondary | ICD-10-CM | POA: Diagnosis not present

## 2019-03-24 DIAGNOSIS — I152 Hypertension secondary to endocrine disorders: Secondary | ICD-10-CM

## 2019-03-24 DIAGNOSIS — K2100 Gastro-esophageal reflux disease with esophagitis, without bleeding: Secondary | ICD-10-CM

## 2019-03-24 NOTE — Patient Instructions (Addendum)
Licensed Clinical Social Worker Visit Information  Goals we discussed today:  Goals Addressed            This Visit's Progress   . Client wants to talk with LCSW about mannaging streess issues faced (pt-stated)       Current Barriers:  . Financial challenges in client with Chronic Diagnoses of GERD, CAD, TYpe 2 DM, HLD, HTN . Transport challenge  Clinical Social Work Clinical Goal(s):  . Client to talk with LCSW in next 30 days about client managing stress issues  Interventions: Talked with client about support with RNCM for nursing needs  Talked with client about [facility] support Talked with client about Food Stamps benefit  Talked with client about pain issues of client  Talked with client about home health needs of client  Talked with client about her upcoming medical procedure  Talked with client about transport needs of client  Patient Self Care Activities:  . Takes medications as prescribed . Attends scheduled medical appointments  Plan: Client to attend medical appointments LCSW to call client in next 4 weeks to discuss psychosocial needs of client Client to talk with [facility] about resources  Initial goal documentation         Materials Provided: No  Follow Up Plan:  LCSW to call client in next 4 weeks to discuss the psychosocial needs of client at that time  The patient verbalized understanding of instructions provided today and declined a print copy of patient instruction materials.   [name].Brandolyn Shortridge MSW, LCSW Licensed Clinical Social Worker [facility] Care Management [phone]

## 2019-03-24 NOTE — Chronic Care Management (AMB) (Signed)
  Care Management Note   Tonya KLOEPPEL is a 74 y.o. year old female who is a primary care patient of [name], Elige Radon, MD. The CM team was consulted for assistance with chronic disease management and care coordination.   I reached out to Garry Heater [place] by phone today.   Review of patient status, including review of consultants reports, relevant laboratory and other test results, and collaboration with appropriate care team members and the patient's provider was performed as part of comprehensive patient evaluation and provision of chronic care management services.   Social determinants of health: risk of stress; risk of transport needs    Office Visit from 12/03/2018 in [facility]  PHQ-9 Total Score  6     GAD 7 : Generalized Anxiety Score 10/07/2018  Nervous, Anxious, on Edge 1  Control/stop worrying 1  Worry too much - different things 0  Trouble relaxing 1  Restless 0  Easily annoyed or irritable 0  Afraid - awful might happen 0  Total GAD 7 Score 3  Anxiety Difficulty Somewhat difficult   Medications   (very important)  New medications from outside sources are available for reconciliation  acetaminophen (TYLENOL) 500 MG tablet Alcohol Swabs (B-D SINGLE USE SWABS REGULAR) PADS atorvastatin (LIPITOR) 20 MG tablet Blood Glucose Calibration (TRUE METRIX LEVEL 2) Normal SOLN Blood Glucose Monitoring Suppl (PRODIGY AUTOCODE BLOOD GLUCOSE) DEVI cholecalciferol (VITAMIN D) 25 MCG (1000 UT) tablet Continuous Blood Gluc Receiver (FREESTYLE LIBRE 14 DAY READER) DEVI Continuous Blood Gluc Sensor (FREESTYLE LIBRE 14 DAY SENSOR) MISC diclofenac (VOLTAREN) 75 MG EC tablet glucose blood (PRODIGY NO CODING BLOOD GLUC) test strip GNP ASPIRIN LOW DOSE 81 MG EC tablet hydrALAZINE (APRESOLINE) 10 MG tablet JANUMET XR 50-500 MG TB24 Liniments (ORTHOGEL) GEL losartan-hydrochlorothiazide (HYZAAR) 50-12.5 MG tablet metoprolol succinate (TOPROL-XL) 50 MG 24 hr  tablet(Expired) nitroGLYCERIN (NITROSTAT) 0.4 MG SL tablet omeprazole (PRILOSEC) 40 MG capsule polyethylene glycol powder (GLYCOLAX/MIRALAX) powder Prodigy Twist Top Lancets 28G MISC  Goals Addressed            This Visit's Progress   . Client wants to talk with LCSW about mannaging streess issues faced (pt-stated)       Current Barriers:  . Financial challenges in client with Chronic Diagnoses of GERD, CAD, TYpe 2 DM, HLD, HTN . Transport challenge  Clinical Social Work Clinical Goal(s):  . Client to talk with LCSW in next 30 days about client managing stress issues  Interventions:  Talked with client about support with RNCM for nursing needs   Talked with client about [facility]jean Niemann [facility] support  Talked with client about Food Stamps benefit  Talked with client about pain issues of client  Talked with client about home health needs of client  Talked with client about her upcoming medical procedure   Talked with client about transport needs of client  Patient Self Care Activities:  . Takes medications as prescribed . Attends scheduled medical appointments  Plan: Client to attend medical appointments LCSW to call client in next 4 weeks to discuss psychosocial needs of client Client to talk with [facility] about resources   Initial goal documentation       Follow Up Plan: LCSW to call client in next 4 weeks to discuss the psychosocial needs of client at that time  [name].Richardson Dubree MSW, LCSW Licensed Clinical Social Worker [facility] [phone]

## 2019-03-26 ENCOUNTER — Ambulatory Visit: Payer: Medicare HMO | Admitting: *Deleted

## 2019-03-26 DIAGNOSIS — N183 Chronic kidney disease, stage 3 unspecified: Secondary | ICD-10-CM

## 2019-03-26 DIAGNOSIS — K2100 Gastro-esophageal reflux disease with esophagitis, without bleeding: Secondary | ICD-10-CM

## 2019-03-26 DIAGNOSIS — Z748 Other problems related to care provider dependency: Secondary | ICD-10-CM

## 2019-03-26 NOTE — Patient Instructions (Signed)
Visit Information  Goals Addressed            This Visit's Progress     Patient Stated   . Transportation Assistance (pt-stated)       Transportation assistance needs in patient with history of stroke, diabetes, hypertension, and CAD.   "I need help getting to these doctor's appointments"  Current Barriers:  Marland Kitchen Knowledge Deficits related to transportation assistance programs . Lacks caregiver support.  . Corporate treasurer.  . Literacy barriers . Transportation barriers  Nurse Case Manager Clinical Goal(s):  Marland Kitchen Over the next 2 days, patient will reach out to [employer] [facility] [employer] to arrange transportation to colonoscopy appointment at [facility] on 03/30/2018  Interventions:  . Chart reviewed . Spoke with patient and son by telephone . Assessed transportation needs . Consulted with [facility] regarding discharge after colonoscopy. She has to have a responsible adult present during and after procedure. Verified that they would discharge her to be transported back home by [employer] as long as as another adult was with her that will be responsible for her care the rest of the day . Provided patient and son with this information . Provided patient and son with [employer] contact information and encouraged them to reach out to them today to arrange transportation. Transportation needed on Monday as well to have Covid test but it may be too late to arrange [employer] for that.  . Questions answered  Patient Self Care Activities:  . Self administers medications as prescribed . Calls pharmacy for medication refills . Calls provider office for new concerns or questions . Unable to perform IADLs independently  Please see past updates related to this goal by clicking on the "Past Updates" button in the selected goal        Other   . Colonoscopy Prep       Current Barriers:  Marland Kitchen Knowledge Deficits related to how to do colonoscopy prep . Literacy barriers . Transportation  barriers  Nurse Case Manager Clinical Goal(s):  Marland Kitchen Over the next 5 days, patient will identify a family member/friend that can assist her with the colonoscopy prep  Interventions:  . Discussed colonoscopy prep with patient . Verified that patient picked up the prep from the pharmacy . Encourage her to reach out to a family member or friend that can help her complete the prep appropriately . Verified that she is scheduled for a Covid test on on 03/28/2018. Marland Kitchen Provided with telephone number for [employer] to arrange transportation  Patient Self Care Activities:  . Performs ADL's independently . Performs IADL's independently . Unable to independently drive or read  Initial goal documentation       The care management team will reach out to the patient again over the next 60 days.    Demetrios Loll, BSN, RN-BC Embedded Chronic Care Manager [facility] / [facility] Care Management Direct Dial: [phone]   The patient verbalized understanding of instructions provided today and declined a print copy of patient instruction materials.

## 2019-03-26 NOTE — Chronic Care Management (AMB) (Signed)
Chronic Care Management   Follow Up Note   03/26/2019 Name: Tonya Carpenter MRN: [id number] DOB: 05-30-1945  Referred by: [name], Fransisca Kaufmann, MD Reason for referral : Chronic Care Management (RN follow up)   Tonya Carpenter is a 74 y.o. year old female who is a primary care patient of [name], Fransisca Kaufmann, MD. The CCM team was consulted for assistance with chronic disease management and care coordination needs.    Review of patient status, including review of consultants reports, relevant laboratory and other test results, and collaboration with appropriate care team members and the patient's provider was performed as part of comprehensive patient evaluation and provision of chronic care management services.    SDOH (Social Determinants of Health) screening performed today: Transportation Stress. See Care Plan for related entries.   Outpatient Encounter Medications as of 03/26/2019  Medication Sig  . acetaminophen (TYLENOL) 500 MG tablet Take 1,000 mg by mouth every 6 (six) hours as needed for mild pain or headache.   . Alcohol Swabs (B-D SINGLE USE SWABS REGULAR) PADS USE TO CHECK BLOOD SUGAR TWICE DAILY AND AS NEEDED Dx E11.9  . aspirin EC 81 MG tablet Take 81 mg by mouth daily.  Marland Kitchen atorvastatin (LIPITOR) 20 MG tablet Take 1 tablet (20 mg total) by mouth daily.  . Blood Glucose Calibration (TRUE METRIX LEVEL 2) Normal SOLN Use as needed with BS machine  . Blood Glucose Monitoring Suppl (PRODIGY AUTOCODE BLOOD GLUCOSE) DEVI Test BS BID E11.9  . cholecalciferol (VITAMIN D) 25 MCG (1000 UT) tablet TAKE 1 TABLET EVERY DAY (Patient taking differently: Take 1,000 Units by mouth daily. )  . Continuous Blood Gluc Receiver (FREESTYLE LIBRE 14 DAY READER) DEVI 1 each by Does not apply route 4 (four) times daily.  . Continuous Blood Gluc Sensor (FREESTYLE LIBRE 14 DAY SENSOR) MISC 1 each by Does not apply route every 14 (fourteen) days.  . diclofenac (VOLTAREN) 75 MG EC tablet Take 1 tablet (75 mg total) by  mouth 2 (two) times daily. (Patient not taking: Reported on 03/25/2019)  . diclofenac Sodium (VOLTAREN) 1 % GEL Apply 2 g topically 4 (four) times daily as needed for pain.  Marland Kitchen glucose blood (PRODIGY NO CODING BLOOD GLUC) test strip TEST BLOOD SUGAR TWICE DAILY Dx E11.9  . hydrALAZINE (APRESOLINE) 10 MG tablet Take 1 tablet (10 mg total) by mouth 2 (two) times daily.  Marland Kitchen JANUMET XR 50-500 MG TB24 Take 1 tablet by mouth 2 (two) times daily.  Marland Kitchen losartan-hydrochlorothiazide (HYZAAR) 50-12.5 MG tablet Take 1 tablet by mouth daily.  . metoprolol succinate (TOPROL-XL) 50 MG 24 hr tablet Take 1 tablet (50 mg total) by mouth daily. Take with or immediately following a meal.  . nitroGLYCERIN (NITROSTAT) 0.4 MG SL tablet PLACE 1 TABLET (0.4 MG TOTAL) UNDER THE TONGUE EVERY 5 (FIVE) MINUTES AS NEEDED. FOR CHEST PAIN (Patient taking differently: Place 0.4 mg under the tongue every 5 (five) minutes as needed for chest pain. )  . omeprazole (PRILOSEC) 40 MG capsule Take 1 capsule (40 mg total) by mouth daily.  . Prodigy Twist Top Lancets 28G MISC TEST TWICE DAILY Dx E11.9   No facility-administered encounter medications on file as of 03/26/2019.     RN Care Plan   . Transportation Assistance (pt-stated)       Transportation assistance needs in patient with history of stroke, diabetes, hypertension, and CAD.   "I need help getting to these doctor's appointments"  Current Barriers:  Marland Kitchen Knowledge Deficits related  to transportation assistance programs . Lacks caregiver support.  . Corporate treasurer.  . Literacy barriers . Transportation barriers  Nurse Case Manager Clinical Goal(s):  Marland Kitchen Over the next 2 days, patient will reach out to [facility] of [facility] to arrange transportation to colonoscopy appointment at [facility] on 03/30/2018  Interventions:  . Chart reviewed . Spoke with patient and son by telephone . Assessed transportation needs . Consulted with [facility] regarding  discharge after colonoscopy. She has to have a responsible adult present during and after procedure. Verified that they would discharge her to be transported back home by [facility]TS as long as as another adult was with her that will be responsible for her care the rest of the day . Provided patient and son with this information . Provided patient and son with ADTS contact information and encouraged them to reach out to them today to arrange transportation. Transportation needed on Monday as well to have Covid test but it may be too late to arrange [facility] for that.  . Questions answered  Patient Self Care Activities:  . Self administers medications as prescribed . Calls pharmacy for medication refills . Calls provider office for new concerns or questions . Unable to perform IADLs independently  Please see past updates related to this goal by clicking on the "Past Updates" button in the selected goal        . Colonoscopy Prep       Current Barriers:  Marland Kitchen Knowledge Deficits related to how to do colonoscopy prep . Literacy barriers . Transportation barriers  Nurse Case Manager Clinical Goal(s):  Marland Kitchen Over the next 5 days, patient will identify a family member/friend that can assist her with the colonoscopy prep  Interventions:  . Discussed colonoscopy prep with patient . Verified that patient picked up the prep from the pharmacy . Encourage her to reach out to a family member or friend that can help her complete the prep appropriately . Verified that she is scheduled for a Covid test on on 03/28/2018. Marland Kitchen Provided with telephone number for [facility] to arrange transportation  Patient Self Care Activities:  . Performs ADL's independently . Performs IADL's independently . Unable to independently drive or read  Initial goal documentation        Follow-up Plan The care management team will reach out to the patient again over the next 60 days.    Demetrios Loll, BSN, RN-BC Embedded Chronic Care  Manager [facility] Care Management Direct Dial: [phone]

## 2019-03-29 ENCOUNTER — Other Ambulatory Visit: Payer: Self-pay

## 2019-03-29 ENCOUNTER — Other Ambulatory Visit (HOSPITAL_COMMUNITY)
Admission: RE | Admit: 2019-03-29 | Discharge: 2019-03-29 | Disposition: A | Discharge status: Home / Self Care | Payer: Medicare HMO | Source: Ambulatory Visit | Attending: Gastroenterology | Admitting: Gastroenterology

## 2019-03-29 DIAGNOSIS — Z01812 Encounter for preprocedural laboratory examination: Secondary | ICD-10-CM | POA: Insufficient documentation

## 2019-03-29 DIAGNOSIS — Z20822 Contact with and (suspected) exposure to covid-19: Secondary | ICD-10-CM | POA: Insufficient documentation

## 2019-03-29 LAB — SARS CORONAVIRUS 2 (TAT 6-24 HRS): SARS Coronavirus 2: NEGATIVE

## 2019-03-31 ENCOUNTER — Encounter (HOSPITAL_COMMUNITY)
Admission: RE | Disposition: A | Discharge status: Home / Self Care | Payer: Self-pay | Source: Home / Self Care | Attending: Gastroenterology

## 2019-03-31 ENCOUNTER — Other Ambulatory Visit: Payer: Self-pay

## 2019-03-31 ENCOUNTER — Ambulatory Visit (HOSPITAL_COMMUNITY)
Admission: RE | Admit: 2019-03-31 | Discharge: 2019-03-31 | Disposition: A | Discharge status: Home / Self Care | Payer: Medicare HMO | Attending: Gastroenterology | Admitting: Gastroenterology

## 2019-03-31 ENCOUNTER — Encounter (HOSPITAL_COMMUNITY): Payer: Self-pay | Admitting: Gastroenterology

## 2019-03-31 DIAGNOSIS — Q438 Other specified congenital malformations of intestine: Secondary | ICD-10-CM | POA: Insufficient documentation

## 2019-03-31 DIAGNOSIS — Z7982 Long term (current) use of aspirin: Secondary | ICD-10-CM | POA: Diagnosis not present

## 2019-03-31 DIAGNOSIS — K644 Residual hemorrhoidal skin tags: Secondary | ICD-10-CM | POA: Insufficient documentation

## 2019-03-31 DIAGNOSIS — Z9841 Cataract extraction status, right eye: Secondary | ICD-10-CM | POA: Diagnosis not present

## 2019-03-31 DIAGNOSIS — Z79899 Other long term (current) drug therapy: Secondary | ICD-10-CM | POA: Insufficient documentation

## 2019-03-31 DIAGNOSIS — J45909 Unspecified asthma, uncomplicated: Secondary | ICD-10-CM | POA: Insufficient documentation

## 2019-03-31 DIAGNOSIS — Z9049 Acquired absence of other specified parts of digestive tract: Secondary | ICD-10-CM | POA: Diagnosis not present

## 2019-03-31 DIAGNOSIS — M1990 Unspecified osteoarthritis, unspecified site: Secondary | ICD-10-CM | POA: Insufficient documentation

## 2019-03-31 DIAGNOSIS — E119 Type 2 diabetes mellitus without complications: Secondary | ICD-10-CM | POA: Insufficient documentation

## 2019-03-31 DIAGNOSIS — Z820 Family history of epilepsy and other diseases of the nervous system: Secondary | ICD-10-CM | POA: Diagnosis not present

## 2019-03-31 DIAGNOSIS — Z8673 Personal history of transient ischemic attack (TIA), and cerebral infarction without residual deficits: Secondary | ICD-10-CM | POA: Insufficient documentation

## 2019-03-31 DIAGNOSIS — K648 Other hemorrhoids: Secondary | ICD-10-CM | POA: Diagnosis not present

## 2019-03-31 DIAGNOSIS — I2510 Atherosclerotic heart disease of native coronary artery without angina pectoris: Secondary | ICD-10-CM | POA: Diagnosis not present

## 2019-03-31 DIAGNOSIS — Z8249 Family history of ischemic heart disease and other diseases of the circulatory system: Secondary | ICD-10-CM | POA: Diagnosis not present

## 2019-03-31 DIAGNOSIS — E785 Hyperlipidemia, unspecified: Secondary | ICD-10-CM | POA: Insufficient documentation

## 2019-03-31 DIAGNOSIS — F419 Anxiety disorder, unspecified: Secondary | ICD-10-CM | POA: Diagnosis not present

## 2019-03-31 DIAGNOSIS — Z1211 Encounter for screening for malignant neoplasm of colon: Secondary | ICD-10-CM | POA: Insufficient documentation

## 2019-03-31 DIAGNOSIS — Z961 Presence of intraocular lens: Secondary | ICD-10-CM | POA: Insufficient documentation

## 2019-03-31 DIAGNOSIS — Z9842 Cataract extraction status, left eye: Secondary | ICD-10-CM | POA: Diagnosis not present

## 2019-03-31 DIAGNOSIS — Z90710 Acquired absence of both cervix and uterus: Secondary | ICD-10-CM | POA: Insufficient documentation

## 2019-03-31 DIAGNOSIS — I10 Essential (primary) hypertension: Secondary | ICD-10-CM | POA: Insufficient documentation

## 2019-03-31 DIAGNOSIS — Z803 Family history of malignant neoplasm of breast: Secondary | ICD-10-CM | POA: Diagnosis not present

## 2019-03-31 DIAGNOSIS — Z791 Long term (current) use of non-steroidal anti-inflammatories (NSAID): Secondary | ICD-10-CM | POA: Insufficient documentation

## 2019-03-31 HISTORY — PX: COLONOSCOPY: SHX5424

## 2019-03-31 LAB — GLUCOSE, CAPILLARY: Glucose-Capillary: 109 mg/dL — ABNORMAL HIGH (ref 70–99)

## 2019-03-31 SURGERY — COLONOSCOPY
Anesthesia: Moderate Sedation

## 2019-03-31 MED ORDER — STERILE WATER FOR IRRIGATION IR SOLN
Status: DC | PRN
  Administered 2019-03-31: 1.5 mL

## 2019-03-31 MED ORDER — MEPERIDINE HCL 100 MG/ML IJ SOLN
INTRAMUSCULAR | Status: AC
  Filled 2019-03-31: qty 2

## 2019-03-31 MED ORDER — MIDAZOLAM HCL 5 MG/5ML IJ SOLN
INTRAMUSCULAR | Status: DC | PRN
  Administered 2019-03-31: 1 mg via INTRAVENOUS
  Administered 2019-03-31 (×2): 2 mg via INTRAVENOUS

## 2019-03-31 MED ORDER — MEPERIDINE HCL 100 MG/ML IJ SOLN
INTRAMUSCULAR | Status: DC | PRN
  Administered 2019-03-31 (×2): 25 mg via INTRAVENOUS

## 2019-03-31 MED ORDER — MIDAZOLAM HCL 5 MG/5ML IJ SOLN
INTRAMUSCULAR | Status: AC
  Filled 2019-03-31: qty 10

## 2019-03-31 MED ORDER — SODIUM CHLORIDE 0.9 % IV SOLN: INTRAVENOUS | Status: DC

## 2019-03-31 NOTE — Op Note (Signed)
[facility] Patient Name: Tonya Carpenter Procedure Date: 03/31/2019 10:31 AM MRN: [id number] Date of Birth: 09-Sep-1945 Attending MD: Jonette Eva MD, MD CSN: [id number] Age: 74 Admit Type: Outpatient Procedure:                Colonoscopy, SCREENING Indications:              Screening for colorectal malignant neoplasm Providers:                Jonette Eva MD, MD, Buel Ream. Windell Hummingbird, RN,                            Burke Keels, Technician Referring MD:             Elige Radon. [name] Medicines:                Meperidine 50 mg IV, Midazolam 5 mg IV Complications:            No immediate complications. Estimated Blood Loss:     Estimated blood loss was minimal. Procedure:                Pre-Anesthesia Assessment:                           - Prior to the procedure, a History and Physical                            was performed, and patient medications and                            allergies were reviewed. The patient's tolerance of                            previous anesthesia was also reviewed. The risks                            and benefits of the procedure and the sedation                            options and risks were discussed with the patient.                            All questions were answered, and informed consent                            was obtained. Prior Anticoagulants: The patient has                            taken no previous anticoagulant or antiplatelet                            agents except for aspirin. ASA Grade Assessment: II                            - A patient with mild systemic disease. After  reviewing the risks and benefits, the patient was                            deemed in satisfactory condition to undergo the                            procedure. After obtaining informed consent, the                            colonoscope was passed under direct vision.                            Throughout the procedure, the  patient's blood                            pressure, pulse, and oxygen saturations were                            monitored continuously. The PCF-[id number] ([id number])                            was introduced through the anus and advanced to the                            the cecum, identified by appendiceal orifice and                            ileocecal valve. The colonoscopy was performed with                            difficulty due to poor endoscopic visualization and                            a tortuous colon. Successful completion of the                            procedure was aided by changing the patient to a                            supine position, straightening and shortening the                            scope to obtain bowel loop reduction, lavage, A                            RETRIEVAL NET TO REMOVE FOOD MATTER FROM THE                            RECTUM, and COLOWRAP. The patient tolerated the                            procedure well. The quality of the bowel  preparation was good except the ascending colon was                            fair. The ileocecal valve, appendiceal orifice, and                            rectum were photographed. Scope In: 10:59:34 AM Scope Out: 11:24:31 AM Scope Withdrawal Time: 0 hours 22 minutes 36 seconds  Total Procedure Duration: 0 hours 24 minutes 57 seconds  Findings:      The recto-sigmoid colon and sigmoid colon were mildly tortuous.      External and internal hemorrhoids were found. The hemorrhoids were small. Impression:               - Tortuous colon.                           - External and internal hemorrhoids. Moderate Sedation:      Moderate (conscious) sedation was administered by the endoscopy nurse       and supervised by the endoscopist. The following parameters were       monitored: oxygen saturation, heart rate, blood pressure, and response       to care. Total physician intraservice time  was 35 minutes. Recommendation:           - Patient has a contact number available for                            emergencies. The signs and symptoms of potential                            delayed complications were discussed with the                            patient. Return to normal activities tomorrow.                            Written discharge instructions were provided to the                            patient.                           - High fiber diet.                           - Continue present medications.                           - Repeat colonoscopy is not recommended due to                            current age (77 years or older) for surveillance.                            IF SHE DEVELOPS ANEMIA OR BRBPR AFTER 2024, WOULD  REPEAT A COLONOSCOPY. Procedure Code(s):        --- Professional ---                           [phone], Colonoscopy, flexible; diagnostic, including                            collection of specimen(s) by brushing or washing,                            when performed (separate procedure)                           99153, Moderate sedation; each additional 15                            minutes intraservice time                           G0500, Moderate sedation services provided by the                            same physician or other qualified health care                            professional performing a gastrointestinal                            endoscopic service that sedation supports,                            requiring the presence of an independent trained                            observer to assist in the monitoring of the                            patient's level of consciousness and physiological                            status; initial 15 minutes of intra-service time;                            patient age 70 years or older (additional time may                            be reported with [phone], as  appropriate) Diagnosis Code(s):        --- Professional ---                           Z12.11, Encounter for screening for malignant                            neoplasm of colon  K64.8, Other hemorrhoids                           Q43.8, Other specified congenital malformations of                            intestine CPT copyright 2019 American Medical Association. All rights reserved. The codes documented in this report are preliminary and upon coder review may  be revised to meet current compliance requirements. Jonette EvaSandi Keyerra Lamere, MD Jonette EvaSandi Maeghan Canny MD, MD 03/31/2019 11:37:22 AM This report has been signed electronically. Number of Addenda: 0

## 2019-03-31 NOTE — Discharge Instructions (Signed)
You have moderate size internal hemorrhoids. YOU DID NOT HAVE ANY POLYPS. THE PREP IN RIGHT COLON WAS NOT IDEAL. POLYPS LESS THAN 3 MM COULD HAVE BEEN MISSED.   DRINK WATER TO KEEP YOUR URINE LIGHT YELLOW.  FOLLOW A HIGH FIBER DIET. AVOID ITEMS THAT CAUSE BLOATING. SEE INFO BELOW.   USE PREPARATION H FOUR TIMES  A DAY IF NEEDED TO RELIEVE RECTAL PAIN/PRESSURE/BLEEDING.   We do not routinely screen for polyps after the age of 41, BUT IF AFTER 2024 YOU SEE BLOOD IN YOUR STOOL OR HAVE TROUBLE WITH A LOW BLOOD COUNT YOU SHOULD HAVE ANOTHER COLONOSCOPY.  Colonoscopy Care After Read the instructions outlined below and refer to this sheet in the next week. These discharge instructions provide you with general information on caring for yourself after you leave the hospital. While your treatment has been planned according to the most current medical practices available, unavoidable complications occasionally occur. If you have any problems or questions after discharge, call DR. Taronda Carpenter, [phone].  ACTIVITY  You may resume your regular activity, but move at a slower pace for the next 24 hours.   Take frequent rest periods for the next 24 hours.   Walking will help get rid of the air and reduce the bloated feeling in your belly (abdomen).   No driving for 24 hours (because of the medicine (anesthesia) used during the test).   You may shower.   Do not sign any important legal documents or operate any machinery for 24 hours (because of the anesthesia used during the test).    NUTRITION  Drink plenty of fluids.   You may resume your normal diet as instructed by your doctor.   Begin with a light meal and progress to your normal diet. Heavy or fried foods are harder to digest and may make you feel sick to your stomach (nauseated).   Avoid alcoholic beverages for 24 hours or as instructed.    MEDICATIONS  You may resume your normal medications.   WHAT YOU CAN EXPECT TODAY  Some  feelings of bloating in the abdomen.   Passage of more gas than usual.   Spotting of blood in your stool or on the toilet paper  .  IF YOU HAD POLYPS REMOVED DURING THE COLONOSCOPY:  Eat a soft diet IF YOU HAVE NAUSEA, BLOATING, ABDOMINAL PAIN, OR VOMITING.    FINDING OUT THE RESULTS OF YOUR TEST Not all test results are available during your visit. DR. Darrick Carpenter WILL CALL YOU WITHIN 14 DAYS OF YOUR PROCEDUE WITH YOUR RESULTS. Do not assume everything is normal if you have not heard from DR. Manuella Carpenter, CALL HER OFFICE AT [phone].  SEEK IMMEDIATE MEDICAL ATTENTION AND CALL THE OFFICE: [phone] IF:  You have more than a spotting of blood in your stool.   Your belly is swollen (abdominal distention).   You are nauseated or vomiting.   You have a temperature over 101F.   You have abdominal pain or discomfort that is severe or gets worse throughout the day.  High-Fiber Diet A high-fiber diet changes your normal diet to include more whole grains, legumes, fruits, and vegetables. Changes in the diet involve replacing refined carbohydrates with unrefined foods. The calorie level of the diet is essentially unchanged. The Dietary Reference Intake (recommended amount) for adult males is 38 grams per day. For adult females, it is 25 grams per day. Pregnant and lactating women should consume 28 grams of fiber per day. Fiber is the intact part of a  plant that is not broken down during digestion. Functional fiber is fiber that has been isolated from the plant to provide a beneficial effect in the body.  PURPOSE Increase stool bulk.  Ease and regulate bowel movements.  Lower cholesterol.  REDUCE RISK OF COLON CANCER  INDICATIONS THAT YOU NEED MORE FIBER Constipation and hemorrhoids.  Uncomplicated diverticulosis (intestine condition) and irritable bowel syndrome.  Weight management.  As a protective measure against hardening of the arteries (atherosclerosis), diabetes, and cancer.    GUIDELINES FOR INCREASING FIBER IN THE DIET Start adding fiber to the diet slowly. A gradual increase of about 5 more grams (2 servings of most fruits or vegetables) per day is best. Too rapid an increase in fiber may result in constipation, flatulence, and bloating.  Drink enough water and fluids to keep your urine clear or pale yellow. Water, juice, or caffeine-free drinks are recommended. Not drinking enough fluid may cause constipation.  Eat a variety of high-fiber foods rather than one type of fiber.  Try to increase your intake of fiber through using high-fiber foods rather than fiber pills or supplements that contain small amounts of fiber.  The goal is to change the types of food eaten. Do not supplement your present diet with high-fiber foods, but replace foods in your present diet.

## 2019-03-31 NOTE — H&P (Signed)
Primary Care Physician:  [name], Elige Radon, MD Primary Gastroenterologist:  Dr. Darrick Penna  Pre-Procedure History & Physical: HPI:  Tonya Carpenter is a 74 y.o. female here for COLON CANCER SCREENING.  Past Medical History:  Diagnosis Date  . Anxiety   . Arthritis    "hands" (01/14/2013)  . Asthma   . Cataract   . Coronary artery disease    Non obstructive CAD 2014 cath.   . Exertional shortness of breath   . Hyperlipidemia   . Hypertension   . Stroke [facility])    "light stroke long years ago; didn't affect me permanently" (01/14/2013)  . Type 2 diabetes mellitus (HCC)     Past Surgical History:  Procedure Laterality Date  . APPENDECTOMY    . CARDIAC CATHETERIZATION  2008   no angiographically significant CAD  . CARDIOVASCULAR STRESS TEST  12/2009   no evidence for stress-induced reversibility or ischemia.  She had a fixed anterior septal wall defect, possibly related to breast attenuation and her EF was 54%.  Marland Kitchen CATARACT EXTRACTION W/PHACO Right 10/25/2014   Procedure: CATARACT EXTRACTION PHACO AND INTRAOCULAR LENS PLACEMENT; CDE:  17.28;  Surgeon: Jethro Bolus, MD;  Location: [facility];  Service: Ophthalmology;  Laterality: Right;  . CATARACT EXTRACTION W/PHACO Left 11/08/2014   Procedure: CATARACT EXTRACTION PHACO AND INTRAOCULAR LENS PLACEMENT (IOC);  Surgeon: Jethro Bolus, MD;  Location: [facility];  Service: Ophthalmology;  Laterality: Left;  CDE: 5.94  . CHOLECYSTECTOMY    . DILATION AND CURETTAGE OF UTERUS    . LEFT HEART CATHETERIZATION WITH CORONARY ANGIOGRAM N/A 01/14/2013   Procedure: LEFT HEART CATHETERIZATION WITH CORONARY ANGIOGRAM;  Surgeon: Kathleene Hazel, MD;  Location: [facility] CATH LAB;  Service: Cardiovascular;  Laterality: N/A;  . TONSILLECTOMY    . TUBAL LIGATION    . VAGINAL HYSTERECTOMY      Prior to Admission medications   Medication Sig Start Date End Date Taking? Authorizing Provider  acetaminophen (TYLENOL) 500 MG tablet Take 1,000 mg by mouth every 6 (six)  hours as needed for mild pain or headache.    Yes [provider]  aspirin EC 81 MG tablet Take 81 mg by mouth daily.   Yes [provider]  atorvastatin (LIPITOR) 20 MG tablet Take 1 tablet (20 mg total) by mouth daily. 12/25/18  Yes [name], Elige Radon, MD  cholecalciferol (VITAMIN D) 25 MCG (1000 UT) tablet TAKE 1 TABLET EVERY DAY Patient taking differently: Take 1,000 Units by mouth daily.  07/29/18  Yes [name], Elige Radon, MD  diclofenac Sodium (VOLTAREN) 1 % GEL Apply 2 g topically 4 (four) times daily as needed for pain. 01/22/19  Yes [provider]  hydrALAZINE (APRESOLINE) 10 MG tablet Take 1 tablet (10 mg total) by mouth 2 (two) times daily. 12/03/18  Yes [name], Elige Radon, MD  JANUMET XR 50-500 MG TB24 Take 1 tablet by mouth 2 (two) times daily. 03/08/19  Yes [name], Elige Radon, MD  losartan-hydrochlorothiazide (HYZAAR) 50-12.5 MG tablet Take 1 tablet by mouth daily. 12/03/18  Yes [name], Elige Radon, MD  metoprolol succinate (TOPROL-XL) 50 MG 24 hr tablet Take 1 tablet (50 mg total) by mouth daily. Take with or immediately following a meal. 04/22/18 03/25/19 Yes Hochrein, [name], MD  omeprazole (PRILOSEC) 40 MG capsule Take 1 capsule (40 mg total) by mouth daily. 12/03/18  Yes [name], Elige Radon, MD  Alcohol Swabs (B-D SINGLE USE SWABS REGULAR) PADS USE TO CHECK BLOOD SUGAR TWICE DAILY AND AS NEEDED Dx E11.9 02/19/19  [name], Fransisca Kaufmann, MD  Blood Glucose Calibration (TRUE METRIX LEVEL 2) Normal SOLN Use as needed with BS machine 07/24/16   Claretta Fraise, MD  Blood Glucose Monitoring Suppl (PRODIGY AUTOCODE BLOOD GLUCOSE) DEVI Test BS BID E11.9 03/15/19   [name], Fransisca Kaufmann, MD  Continuous Blood Gluc Receiver (FREESTYLE LIBRE 14 DAY READER) DEVI 1 each by Does not apply route 4 (four) times daily. 03/23/19   [name], Fransisca Kaufmann, MD  Continuous Blood Gluc Sensor (FREESTYLE LIBRE 14 DAY SENSOR) MISC 1 each by Does not apply route every 14 (fourteen) days. 03/23/19    [name], Fransisca Kaufmann, MD  diclofenac (VOLTAREN) 75 MG EC tablet Take 1 tablet (75 mg total) by mouth 2 (two) times daily. Patient not taking: Reported on 03/25/2019 03/08/19   [name], Fransisca Kaufmann, MD  glucose blood (PRODIGY NO CODING BLOOD GLUC) test strip TEST BLOOD SUGAR TWICE DAILY Dx E11.9 02/19/19   [name], Fransisca Kaufmann, MD  nitroGLYCERIN (NITROSTAT) 0.4 MG SL tablet PLACE 1 TABLET (0.4 MG TOTAL) UNDER THE TONGUE EVERY 5 (FIVE) MINUTES AS NEEDED. FOR CHEST PAIN Patient taking differently: Place 0.4 mg under the tongue every 5 (five) minutes as needed for chest pain.  08/27/18   [name], Fransisca Kaufmann, MD  Prodigy Twist Top Lancets 28G MISC TEST TWICE DAILY Dx E11.9 02/19/19   [name], Fransisca Kaufmann, MD    Allergies as of 12/29/2018  . (No Known Allergies)    Family History  Problem Relation Age of Onset  . Hypertension Mother   . Hypertension Father   . Cancer Sister        breast  . Heart Problems Son   . Seizures Son     Social History   Socioeconomic History  . Marital status: Widowed    Spouse name: Not on file  . Number of children: 2  . Years of education: Not on file  . Highest education level: Not on file  Occupational History  . Occupation: Disability    Employer: UNEMPLOYED  Tobacco Use  . Smoking status: Never Smoker  . Smokeless tobacco: Never Used  Substance and Sexual Activity  . Alcohol use: No  . Drug use: No  . Sexual activity: Not on file  Other Topics Concern  . Not on file  Social History Narrative   Lives in [place], [place].    Social Determinants of Health   Financial Resource Strain: Medium Risk  . Difficulty of Paying Living Expenses: Somewhat hard  Food Insecurity: No Food Insecurity  . Worried About Charity fundraiser in the Last Year: Never true  . Ran Out of Food in the Last Year: Never true  Transportation Needs: Unmet Transportation Needs  . Lack of Transportation (Medical): Yes  . Lack of Transportation (Non-Medical): Not on file  Physical  Activity: Sufficiently Active  . Days of Exercise per Week: 7 days  . Minutes of Exercise per Session: 60 min  Stress: Stress Concern Present  . Feeling of Stress : Rather much  Social Connections: Slightly Isolated  . Frequency of Communication with Friends and Family: More than three times a week  . Frequency of Social Gatherings with Friends and Family: More than three times a week  . Attends Religious Services: More than 4 times per year  . Active Member of Clubs or Organizations: Yes  . Attends Archivist Meetings: More than 4 times per year  . Marital Status: Widowed  Intimate Partner Violence: Not At Risk  . Fear of Current  or Ex-Partner: No  . Emotionally Abused: No  . Physically Abused: No  . Sexually Abused: No    Review of Systems: See HPI, otherwise negative ROS   Physical Exam: BP (!) 156/67   Pulse (!) 53   Temp 98.4 F (36.9 C) (Oral)   Resp 19   Ht 5\' 3"  (1.6 m)   Wt 70.8 kg   SpO2 100%   BMI 27.63 kg/m  General:   Alert,  pleasant and cooperative in NAD Head:  Normocephalic and atraumatic. Neck:  Supple; Lungs:  Clear throughout to auscultation.    Heart:  Regular rate and rhythm. Abdomen:  Soft, nontender and nondistended. Normal bowel sounds, without guarding, and without rebound.   Neurologic:  Alert and  oriented x4;  grossly normal neurologically.  Impression/Plan:    SCREENING  Plan:  1. TCS TODAY DISCUSSED PROCEDURE, BENEFITS, & RISKS: < 1% chance of medication reaction, bleeding, perforation, ASPIRATION, or rupture of spleen/liver requiring surgery to fix it and missed polyps < 1 cm 10-20% of the time.

## 2019-04-01 ENCOUNTER — Other Ambulatory Visit: Payer: Self-pay | Admitting: Family Medicine

## 2019-04-01 ENCOUNTER — Telehealth: Payer: Self-pay | Admitting: Family Medicine

## 2019-04-01 DIAGNOSIS — E1169 Type 2 diabetes mellitus with other specified complication: Secondary | ICD-10-CM

## 2019-04-01 NOTE — Telephone Encounter (Signed)
Okay that is fine to send, I do not know if he has long-term or short-term disability from work or [employer] but I am okay with sending the note.Arville Care, MD [facility] 04/01/2019, 1:39 PM

## 2019-04-01 NOTE — Telephone Encounter (Signed)
Letter wrote and placed up front- patient aware 

## 2019-04-08 ENCOUNTER — Telehealth: Payer: Self-pay | Admitting: Family Medicine

## 2019-04-08 NOTE — Telephone Encounter (Signed)
Patient has a follow up appointment scheduled and can discuss need for help then.

## 2019-04-22 ENCOUNTER — Other Ambulatory Visit: Payer: Self-pay | Admitting: Cardiology

## 2019-04-22 ENCOUNTER — Ambulatory Visit (INDEPENDENT_AMBULATORY_CARE_PROVIDER_SITE_OTHER): Payer: Medicare HMO | Admitting: Licensed Clinical Social Worker

## 2019-04-22 DIAGNOSIS — I2510 Atherosclerotic heart disease of native coronary artery without angina pectoris: Secondary | ICD-10-CM | POA: Diagnosis not present

## 2019-04-22 DIAGNOSIS — E1159 Type 2 diabetes mellitus with other circulatory complications: Secondary | ICD-10-CM

## 2019-04-22 DIAGNOSIS — E1122 Type 2 diabetes mellitus with diabetic chronic kidney disease: Secondary | ICD-10-CM

## 2019-04-22 DIAGNOSIS — E1169 Type 2 diabetes mellitus with other specified complication: Secondary | ICD-10-CM | POA: Diagnosis not present

## 2019-04-22 DIAGNOSIS — I10 Essential (primary) hypertension: Secondary | ICD-10-CM | POA: Diagnosis not present

## 2019-04-22 DIAGNOSIS — I152 Hypertension secondary to endocrine disorders: Secondary | ICD-10-CM

## 2019-04-22 DIAGNOSIS — E785 Hyperlipidemia, unspecified: Secondary | ICD-10-CM | POA: Diagnosis not present

## 2019-04-22 DIAGNOSIS — N183 Chronic kidney disease, stage 3 unspecified: Secondary | ICD-10-CM

## 2019-04-22 DIAGNOSIS — K2100 Gastro-esophageal reflux disease with esophagitis, without bleeding: Secondary | ICD-10-CM

## 2019-04-22 NOTE — Patient Instructions (Addendum)
Licensed Clinical Social Worker Visit Information  Goals we discussed today:  Goals    . Client wants to talk with LCSW about mannaging streess issues faced (pt-stated)     Current Barriers:  . Financial challenges in client with Chronic Diagnoses of GERD, CAD, TYpe 2 DM, HLD, HTN . Transport challenge  Clinical Social Work Clinical Goal(s):  . Client to talk with LCSW in next 30 days about client managing stress issues  Interventions: Talked with client about support with RNCM for nursing needs  Talked with client about [facility] support Talked with client about Food Stamps benefit  Talked with client about pain issues of client  Talked with client about home health needs of client  Talked with client about transport needs of client  Talked with client about Medicaid application process for client  Patient Self Care Activities:  . Takes medications as prescribed . Attends scheduled medical appointments  Plan: Client to attend medical appointments LCSW to call client in next 4 weeks to discuss psychosocial needs of client Client to talk with [facility] about resources   Initial goal documentation            Materials Provided:  No  Follow Up Plan: LCSW to call client in next 4 weeks to discuss the psychosocial needs of client at that time  The patient verbalized understanding of instructions provided today and declined a print copy of patient instruction materials.   [name].Unity Luepke MSW, LCSW Licensed Clinical Social Worker [facility] Care Management [phone]

## 2019-04-22 NOTE — Chronic Care Management (AMB) (Signed)
  Care Management Note   Tonya Carpenter is a 74 y.o. year old female who is a primary care patient of [name], Fransisca Kaufmann, MD. The CM team was consulted for assistance with chronic disease management and care coordination.   I reached out to [name] by phone today.     Review of patient status, including review of consultants reports, relevant laboratory and other test results, and collaboration with appropriate care team members and the patient's provider was performed as part of comprehensive patient evaluation and provision of chronic care management services.   Social determinants of health: risk of social isolation; risk of financial strain; risk of depression; risk of stress; risk of transportation challenges    Office Visit from 12/03/2018 in [place]  PHQ-9 Total Score  6     GAD 7 : Generalized Anxiety Score 10/07/2018  Nervous, Anxious, on Edge 1  Control/stop worrying 1  Worry too much - different things 0  Trouble relaxing 1  Restless 0  Easily annoyed or irritable 0  Afraid - awful might happen 0  Total GAD 7 Score 3  Anxiety Difficulty Somewhat difficult   Medications   (very important)  New medications from outside sources are available for reconciliation  acetaminophen (TYLENOL) 500 MG tablet Alcohol Swabs (B-D SINGLE USE SWABS REGULAR) PADS aspirin EC 81 MG tablet atorvastatin (LIPITOR) 20 MG tablet Blood Glucose Calibration (TRUE METRIX LEVEL 2) Normal SOLN Blood Glucose Monitoring Suppl (ACCU-CHEK AVIVA PLUS) w/Device KIT cholecalciferol (VITAMIN D) 25 MCG (1000 UT) tablet diclofenac Sodium (VOLTAREN) 1 % GEL glucose blood (ACCU-CHEK AVIVA PLUS) test strip hydrALAZINE (APRESOLINE) 10 MG tablet JANUMET XR 50-500 MG TB24 losartan-hydrochlorothiazide (HYZAAR) 50-12.5 MG tablet metoprolol succinate (TOPROL-XL) 50 MG 24 hr tablet(Expired) nitroGLYCERIN (NITROSTAT) 0.4 MG SL tablet omeprazole (PRILOSEC) 40 MG capsule Prodigy Twist Top  Lancets 28G MISC  Goals    . Client wants to talk with LCSW about mannaging streess issues faced (pt-stated)     Current Barriers:  . Financial challenges in client with Chronic Diagnoses of GERD, CAD, TYpe 2 DM, HLD, HTN . Transport challenge  Clinical Social Work Clinical Goal(s):  . Client to talk with LCSW in next 30 days about client managing stress issues  Interventions:  Talked with client about support with RNCM for nursing needs  Talkedwith clientabout [name] Stamps benefit  Talked with client about pain issues of client  [place] client about home health needs of client  Talked with client about transport needs of client  Talked with client about Medicaid application process for client  Patient Self Care Activities:  . Takes medications as prescribed . Attends scheduled medical appointments  Plan: Client to attend medical appointments LCSW to call client in next 4 weeks to discuss psychosocial needs of client Client to talk with [facility] about resources  Initial goal documentation       Follow Up Plan:  LCSW to call client in next 4 weeks to discuss the psychosocial needs of client at that time  [name].[name] MSW, LCSW Licensed Clinical Social Worker [facility] Care Management [phone]

## 2019-04-27 DIAGNOSIS — Z1231 Encounter for screening mammogram for malignant neoplasm of breast: Secondary | ICD-10-CM | POA: Diagnosis not present

## 2019-05-06 ENCOUNTER — Ambulatory Visit: Payer: Medicare HMO | Admitting: *Deleted

## 2019-05-06 DIAGNOSIS — G8929 Other chronic pain: Secondary | ICD-10-CM

## 2019-05-06 DIAGNOSIS — N183 Chronic kidney disease, stage 3 unspecified: Secondary | ICD-10-CM

## 2019-05-06 DIAGNOSIS — E1159 Type 2 diabetes mellitus with other circulatory complications: Secondary | ICD-10-CM

## 2019-05-06 NOTE — Patient Instructions (Signed)
Visit Information  Goals Addressed            This Visit's Progress     Patient Stated   . In-Home Care (pt-stated)       In home care needs in a patient with hypertension, diabetes, and chronic right shoulder pain.   "I need somebody to help me in my home. I'm here most of the time by myself and I can't take care of everything"  Current Barriers:  Marland Kitchen Knowledge Deficits related to in-home care options . Lacks caregiver support.  . Corporate treasurer.  . Literacy barriers . Transportation barriers  Nurse Case Manager Clinical Goal(s):  Marland Kitchen Over the next 30 days, patient will work with CCM Team to address needs related to in-home care services  Interventions:  . Chart reviewed o Patient has requested a note for son to be able to stay home from work to sit with her. Provider was agreeable. Marland Kitchen Reached out to [employer], LCSW regarding Medicaid application. . Patient was previously given information on Medicaid by LCSW and RN. PCS through Medicaid would be a great benefit for this patient.  . Previously provided patient with verbal educational materials related to in-home care services and options with and without full Medicaid coverage . Patient was previously referred to the [facility] Care Guide for research into in-home care options and patient was contacted by care guide and provided resource information  Patient Self Care Activities:  . Self administers medications as prescribed . Calls pharmacy for medication refills . Performs ADL's independently . Calls provider office for new concerns or questions . Unable to perform IADLs independently  Please see past updates related to this goal by clicking on the "Past Updates" button in the selected goal        Other   . COMPLETED: Colonoscopy Prep       Current Barriers:  Marland Kitchen Knowledge Deficits related to how to do colonoscopy prep . Literacy barriers . Transportation barriers  Nurse Case Manager Clinical Goal(s):  Marland Kitchen Over the  next 5 days, patient will identify a family member/friend that can assist her with the colonoscopy prep  Interventions:  . Discussed colonoscopy prep with patient . Verified that patient picked up the prep from the pharmacy . Encourage her to reach out to a family member or friend that can help her complete the prep appropriately . Verified that she is scheduled for a Covid test on on 03/28/2018. Marland Kitchen Provided with telephone number for [facility]TS to arrange transportation  Patient Self Care Activities:  . Performs ADL's independently . Performs IADL's independently . Unable to independently drive or read  Initial goal documentation Colonoscopy prep and colonoscopy were completed successfully.      Demetrios Loll, BSN, RN-BC Embedded Chronic Care Manager [facility] / [facility] Care Management Direct Dial: [phone]

## 2019-05-06 NOTE — Chronic Care Management (AMB) (Signed)
  Chronic Care Management   Follow Up Note   05/06/2019 Name: Tonya Carpenter MRN: [id number] DOB: 06-21-1945  Referred by: [name], Elige Radon, MD Reason for referral : Chronic Care Management (RN follow up)   Tonya Carpenter is a 75 y.o. year old female who is a primary care patient of [name], Elige Radon, MD. The CCM team was consulted for assistance with chronic disease management and care coordination needs.    Review of patient status, including review of consultants reports, relevant laboratory and other test results, and collaboration with appropriate care team members and the patient's provider was performed as part of comprehensive patient evaluation and provision of chronic care management services.    Telephone follow-up was unsuccessful, possibly due to power/telephone outages due to severe winter weather.    RN Care Plan   . In-Home Care        In home care needs in a patient with hypertension, diabetes, and chronic right shoulder pain.  "I need somebody to help me in my home. I'm here most of the time by myself and I can't take care of everything"  Current Barriers:  Marland Kitchen Knowledge Deficits related to in-home care options . Lacks caregiver support.  . Corporate treasurer.  . Literacy barriers . Transportation barriers  Nurse Case Manager Clinical Goal(s):  Marland Kitchen Over the next 30 days, patient will work with CCM Team to address needs related to in-home care services  Interventions:  . Chart reviewed o Patient has requested a note for son to be able to stay home from work to sit with her. Provider was agreeable. Marland Kitchen Reached out to [employer], LCSW today regarding Medicaid application. . Patient was previously given information on Medicaid by LCSW and RN. PCS through Medicaid would be a great benefit for this patient.  . Previously provided patient with verbal educational materials related to in-home care services and options with and without full Medicaid coverage . Patient was  previously referred to the [facility] Care Guide for research into in-home care options and patient was contacted by care guide and provided resource information  Patient Self Care Activities:  . Self administers medications as prescribed . Calls pharmacy for medication refills . Performs ADL's independently . Calls provider office for new concerns or questions . Unable to perform IADLs independently  Please see past updates related to this goal by clicking on the "Past Updates" button in the selected goal            Plan:   The care management team will reach out to the patient again over the next 30 days.   Demetrios Loll, BSN, RN-BC Embedded Chronic Care Manager [facility] / [facility] Care Management Direct Dial: [phone]

## 2019-05-07 ENCOUNTER — Ambulatory Visit: Payer: Medicare HMO | Admitting: Licensed Clinical Social Worker

## 2019-05-07 DIAGNOSIS — I2510 Atherosclerotic heart disease of native coronary artery without angina pectoris: Secondary | ICD-10-CM | POA: Diagnosis not present

## 2019-05-07 DIAGNOSIS — K2100 Gastro-esophageal reflux disease with esophagitis, without bleeding: Secondary | ICD-10-CM

## 2019-05-07 DIAGNOSIS — E1169 Type 2 diabetes mellitus with other specified complication: Secondary | ICD-10-CM | POA: Diagnosis not present

## 2019-05-07 DIAGNOSIS — E1159 Type 2 diabetes mellitus with other circulatory complications: Secondary | ICD-10-CM

## 2019-05-07 DIAGNOSIS — E1122 Type 2 diabetes mellitus with diabetic chronic kidney disease: Secondary | ICD-10-CM

## 2019-05-07 DIAGNOSIS — I152 Hypertension secondary to endocrine disorders: Secondary | ICD-10-CM

## 2019-05-07 DIAGNOSIS — E785 Hyperlipidemia, unspecified: Secondary | ICD-10-CM | POA: Diagnosis not present

## 2019-05-07 DIAGNOSIS — I10 Essential (primary) hypertension: Secondary | ICD-10-CM | POA: Diagnosis not present

## 2019-05-07 DIAGNOSIS — N183 Chronic kidney disease, stage 3 unspecified: Secondary | ICD-10-CM | POA: Diagnosis not present

## 2019-05-07 NOTE — Chronic Care Management (AMB) (Signed)
  Care Management Note   Tonya Carpenter is a 74 y.o. year old female who is a primary care patient of [name], Fransisca Kaufmann, MD. The CM team was consulted for assistance with chronic disease management and care coordination.   I reached out to [place] by phone today.   Review of patient status, including review of consultants reports, relevant laboratory and other test results, and collaboration with appropriate care team members and the patient's provider was performed as part of comprehensive patient evaluation and provision of chronic care management services.   Social determinants of health: risk of social isolation; risk of stress; risk of transport needs    Office Visit from 12/03/2018 in [place]  PHQ-9 Total Score  6     GAD 7 : Generalized Anxiety Score 10/07/2018  Nervous, Anxious, on Edge 1  Control/stop worrying 1  Worry too much - different things 0  Trouble relaxing 1  Restless 0  Easily annoyed or irritable 0  Afraid - awful might happen 0  Total GAD 7 Score 3  Anxiety Difficulty Somewhat difficult   Medications   (very important)  New medications from outside sources are available for reconciliation  acetaminophen (TYLENOL) 500 MG tablet Alcohol Swabs (B-D SINGLE USE SWABS REGULAR) PADS aspirin EC 81 MG tablet atorvastatin (LIPITOR) 20 MG tablet Blood Glucose Calibration (TRUE METRIX LEVEL 2) Normal SOLN Blood Glucose Monitoring Suppl (ACCU-CHEK AVIVA PLUS) w/Device KIT cholecalciferol (VITAMIN D) 25 MCG (1000 UT) tablet diclofenac Sodium (VOLTAREN) 1 % GEL glucose blood (ACCU-CHEK AVIVA PLUS) test strip hydrALAZINE (APRESOLINE) 10 MG tablet JANUMET XR 50-500 MG TB24 losartan-hydrochlorothiazide (HYZAAR) 50-12.5 MG tablet metoprolol succinate (TOPROL-XL) 50 MG 24 hr tablet nitroGLYCERIN (NITROSTAT) 0.4 MG SL tablet omeprazole (PRILOSEC) 40 MG capsule Prodigy Twist Top Lancets 28G MISC   Goals    . Client wants to talk with LCSW  about mannaging streess issues faced (pt-stated)     Current Barriers:  . Financial challenges in client with Chronic Diagnoses of GERD, CAD, TYpe 2 DM, HLD, HTN . Transport challenge  Clinical Social Work Clinical Goal(s):  . Client to talk with LCSW in next 30 days about client managing stress issues  Interventions:   Talked with client about support with RNCM for nursing needs  Talked with client about pain issues of client  [place] client about home health needs of client  Talked with client about transport needs of client  Talked with client about Medicaid application process for client  Collaborated with RNCM regarding in home care needs of client and       nursing needs of client     Talked with client about PACE program      Talked with client about applying for Medicaid and talked with client      about PCS services through [facility] benefit  Patient Self Care Activities:  . Takes medications as prescribed . Attends scheduled medical appointments  Plan: Client to attend medical appointments LCSW to call client in next 4 weeks to discuss psychosocial needs of client Client to talk with [facility] about resources  Initial goal documentation     Follow Up Plan: LCSW to call client in next 4 weeks to discuss the psychosocial needs of client at that time  [name].Jsoeph Podesta MSW, LCSW Licensed Clinical Social Worker [facility] Care Management [phone]

## 2019-05-07 NOTE — Patient Instructions (Addendum)
Licensed Clinical Social Worker Visit Information  Goals we discussed today:  Goals    . Client wants to talk with LCSW about mannaging streess issues faced (pt-stated)     Current Barriers:  . Financial challenges in client with Chronic Diagnoses of GERD, CAD, TYpe 2 DM, HLD, HTN . Transport challenge  Clinical Social Work Clinical Goal(s):  . Client to talk with LCSW in next 30 days about client managing stress issues  Interventions:   Talked with client about support with RNCM for nursing needs  Talked with client about pain issues of client  Talkedwith client about home health needs of client  Talked with client about transport needs of client  Talked with client about Medicaid application process for client  Collaborated with RNCM regarding in home care needs of client and       nursing needs of client     Talked with client about PACE program      Talked with client about applying for Medicaid and talked with client      about PCS services through [facility] benefit  Patient Self Care Activities:  . Takes medications as prescribed . Attends scheduled medical appointments  Plan: Client to attend medical appointments LCSW to call client in next 4 weeks to discuss psychosocial needs of client Client to talk with [facility] about resources  Initial goal documentation         Materials Provided:No   Follow Up Plan: LCSW to call client in next 4 weeks to discuss the psychosocial needs of client at that time  The patient verbalized understanding of instructions provided today and declined a print copy of patient instruction materials.   [name].Jamion Carter MSW, LCSW Licensed Clinical Social Worker [facility] Care Management [phone]

## 2019-05-19 ENCOUNTER — Telehealth: Payer: Medicare HMO

## 2019-05-19 ENCOUNTER — Other Ambulatory Visit: Payer: Self-pay | Admitting: Family Medicine

## 2019-05-20 ENCOUNTER — Telehealth: Payer: Self-pay | Admitting: Family Medicine

## 2019-05-20 NOTE — Telephone Encounter (Signed)
I have already tried at  a previous appt and she did not qualify.  She would have to self hire, which I know she likely can't afford.  She may consider nursing home?

## 2019-05-20 NOTE — Telephone Encounter (Signed)
Patient will need appointment.  Do you think she would qualify?

## 2019-05-20 NOTE — Telephone Encounter (Signed)
Patient aware and verbalizes understanding. 

## 2019-06-03 ENCOUNTER — Telehealth: Payer: Medicare HMO

## 2019-06-03 ENCOUNTER — Ambulatory Visit (INDEPENDENT_AMBULATORY_CARE_PROVIDER_SITE_OTHER): Payer: Medicare HMO | Admitting: Licensed Clinical Social Worker

## 2019-06-03 DIAGNOSIS — N183 Chronic kidney disease, stage 3 unspecified: Secondary | ICD-10-CM | POA: Diagnosis not present

## 2019-06-03 DIAGNOSIS — K2100 Gastro-esophageal reflux disease with esophagitis, without bleeding: Secondary | ICD-10-CM

## 2019-06-03 DIAGNOSIS — E785 Hyperlipidemia, unspecified: Secondary | ICD-10-CM

## 2019-06-03 DIAGNOSIS — I2510 Atherosclerotic heart disease of native coronary artery without angina pectoris: Secondary | ICD-10-CM

## 2019-06-03 DIAGNOSIS — E1159 Type 2 diabetes mellitus with other circulatory complications: Secondary | ICD-10-CM

## 2019-06-03 DIAGNOSIS — I10 Essential (primary) hypertension: Secondary | ICD-10-CM

## 2019-06-03 DIAGNOSIS — E1169 Type 2 diabetes mellitus with other specified complication: Secondary | ICD-10-CM

## 2019-06-03 DIAGNOSIS — E1122 Type 2 diabetes mellitus with diabetic chronic kidney disease: Secondary | ICD-10-CM

## 2019-06-03 NOTE — Patient Instructions (Addendum)
Licensed Clinical Social Worker Visit Information  Goals we discussed today:  Goals    . Client wants to talk with LCSW about mannaging streess issues faced (pt-stated)     Current Barriers:  . Financial challenges in client with Chronic Diagnoses of GERD, CAD, TYpe 2 DM, HLD, HTN . Transport challenge  Clinical Social Work Clinical Goal(s):  . Client to talk with LCSW in next 30 days about client managing stress issues  Interventions: Talked with client about support with RNCM for nursing needs  Talked with client about client completion of ADLs  Talked with client about home health needs of client  Talked with client about transport needs of client  Previously collaborated with [facility] regarding in home care needs of client and  nursing needs of client  Talked with client previously about PACE program Talked with client about applying for Medicaid and talked with client about PCS services through [facility] benefit  Patient Self Care Activities:  . Takes medications as prescribed . Attends scheduled medical appointments  Plan: Client to attend medical appointments LCSW to call client in next 4 weeks to discuss psychosocial needs of client Client to talk with [facility] about resources  Initial goal documentation        Materials Provided: No  Follow Up Plan:  LCSW to call client in next 4 weeks to discuss the psychosocial  needs of client at that time  The patient verbalized understanding of instructions provided today and declined a print copy of patient instruction materials.   [name].Ikey Omary MSW, LCSW Licensed Clinical Social Worker [facility] [phone]

## 2019-06-03 NOTE — Chronic Care Management (AMB) (Signed)
  Care Management Note   Tonya Carpenter is a 74 y.o. year old female who is a primary care patient of [name], Fransisca Kaufmann, MD. The CM team was consulted for assistance with chronic disease management and care coordination.   I reached out to [place] by phone today.    Review of patient status, including review of consultants reports, relevant laboratory and other test results, and collaboration with appropriate care team members and the patient's provider was performed as part of comprehensive patient evaluation and provision of chronic care management services.   Social determinants of health:risk of social isolation; risk of financial strain; risk of stress; risk of transport challenges    Office Visit from 12/03/2018 in [place]  PHQ-9 Total Score  6     GAD 7 : Generalized Anxiety Score 10/07/2018  Nervous, Anxious, on Edge 1  Control/stop worrying 1  Worry too much - different things 0  Trouble relaxing 1  Restless 0  Easily annoyed or irritable 0  Afraid - awful might happen 0  Total GAD 7 Score 3  Anxiety Difficulty Somewhat difficult   Medications   (very important)  New medications from outside sources are available for reconciliation  acetaminophen (TYLENOL) 500 MG tablet Alcohol Swabs (B-D SINGLE USE SWABS REGULAR) PADS aspirin EC 81 MG tablet atorvastatin (LIPITOR) 20 MG tablet Blood Glucose Calibration (TRUE METRIX LEVEL 2) Normal SOLN Blood Glucose Monitoring Suppl (ACCU-CHEK AVIVA PLUS) w/Device KIT cholecalciferol (VITAMIN D) 25 MCG (1000 UT) tablet diclofenac Sodium (VOLTAREN) 1 % GEL glucose blood (ACCU-CHEK AVIVA PLUS) test strip hydrALAZINE (APRESOLINE) 10 MG tablet JANUMET XR 50-500 MG TB24 losartan-hydrochlorothiazide (HYZAAR) 50-12.5 MG tablet metoprolol succinate (TOPROL-XL) 50 MG 24 hr tablet nitroGLYCERIN (NITROSTAT) 0.4 MG SL tablet omeprazole (PRILOSEC) 40 MG capsule Prodigy Twist Top Lancets 28G MISC  Goals    .  Client wants to talk with LCSW about mannaging streess issues faced (pt-stated)     Current Barriers:  . Financial challenges in client with Chronic Diagnoses of GERD, CAD, TYpe 2 DM, HLD, HTN . Transport challenge  Clinical Social Work Clinical Goal(s):  . Client to talk with LCSW in next 30 days about client managing stress issues  Interventions:  Talked with client about support with RNCM for nursing needs  Talked with client about client completion of ADLs  [place] client about home health needs of client  Talked with client about transport needs of client  Collaborated with RNCM regarding in home care needs of client and       nursing needs of client     Talked with client previously about PACE program      Talked with client about applying for Medicaid and talked with client      about PCS services through [facility] benefit   Patient Self Care Activities:  . Takes medications as prescribed . Attends scheduled medical appointments  Plan: Client to attend medical appointments LCSW to call client in next 4 weeks to discuss psychosocial needs of client Client to talk with [facility] about resources  Initial goal documentation       Follow Up Plan: LCSW to call client in the next 4 weeks to discuss the psychosocial needs of client at that time  [name].Oliviagrace Crisanti MSW, LCSW Licensed Clinical Social Worker [facility] [phone]

## 2019-06-04 ENCOUNTER — Telehealth: Payer: Self-pay | Admitting: Family Medicine

## 2019-06-09 ENCOUNTER — Ambulatory Visit (INDEPENDENT_AMBULATORY_CARE_PROVIDER_SITE_OTHER): Payer: Medicare HMO | Admitting: Family Medicine

## 2019-06-09 ENCOUNTER — Other Ambulatory Visit: Payer: Self-pay

## 2019-06-09 ENCOUNTER — Encounter: Payer: Self-pay | Admitting: Family Medicine

## 2019-06-09 VITALS — BP 131/76 | HR 60 | Temp 97.9°F | Ht 63.0 in | Wt 152.0 lb

## 2019-06-09 DIAGNOSIS — E1122 Type 2 diabetes mellitus with diabetic chronic kidney disease: Secondary | ICD-10-CM | POA: Diagnosis not present

## 2019-06-09 DIAGNOSIS — E1159 Type 2 diabetes mellitus with other circulatory complications: Secondary | ICD-10-CM

## 2019-06-09 DIAGNOSIS — N183 Chronic kidney disease, stage 3 unspecified: Secondary | ICD-10-CM

## 2019-06-09 DIAGNOSIS — E1169 Type 2 diabetes mellitus with other specified complication: Secondary | ICD-10-CM | POA: Diagnosis not present

## 2019-06-09 DIAGNOSIS — I10 Essential (primary) hypertension: Secondary | ICD-10-CM

## 2019-06-09 DIAGNOSIS — E785 Hyperlipidemia, unspecified: Secondary | ICD-10-CM

## 2019-06-09 DIAGNOSIS — K2100 Gastro-esophageal reflux disease with esophagitis, without bleeding: Secondary | ICD-10-CM | POA: Diagnosis not present

## 2019-06-09 LAB — BAYER DCA HB A1C WAIVED: HB A1C (BAYER DCA - WAIVED): 6.8 % (ref <7.0)

## 2019-06-09 NOTE — Progress Notes (Signed)
BP 131/76   Pulse 60   Temp 97.9 F (36.6 C)   Ht _0  (1.6 m)   Wt 152 lb (68.9 kg)   SpO2 96%   BMI 26.93 kg/m    Subjective:   Patient ID: Tonya Carpenter, female    DOB: 10-10-1945, 74 y.o.   MRN: [id number]  HPI: Tonya Carpenter is a 74 y.o. female presenting on 06/09/2019 for Medical Management of Chronic Issues, Diabetes, and Hypertension   HPI Type 2 diabetes mellitus Patient comes in today for recheck of his diabetes. Patient has been currently taking Janumet, A1c is 6.8. Patient is currently on an ACE inhibitor/ARB. Patient has not seen an ophthalmologist this year. Patient denies any issues with their feet.   Hypertension Patient is currently on losartan hydrochlorothiazide and hydralazine and metoprolol, and their blood pressure today is 131/76. Patient denies any lightheadedness or dizziness. Patient denies headaches, blurred vision, chest pains, shortness of breath, or weakness. Denies any side effects from medication and is content with current medication.   Hyperlipidemia Patient is coming in for recheck of his hyperlipidemia. The patient is currently taking atorvastatin. They deny any issues with myalgias or history of liver damage from it. They deny any focal numbness or weakness or chest pain.   GERD Patient is currently on omeprazole.  She denies any major symptoms or abdominal pain or belching or burping. She denies any blood in her stool or lightheadedness or dizziness.   Relevant past medical, surgical, family and social history reviewed and updated as indicated. Interim medical history since our last visit reviewed. Allergies and medications reviewed and updated.  Review of Systems  Constitutional: Negative for chills and fever.  Eyes: Negative for visual disturbance.  Respiratory: Negative for chest tightness and shortness of breath.   Cardiovascular: Negative for chest pain and leg swelling.  Musculoskeletal: Negative for back pain and gait problem.  Skin:  Negative for rash.  Neurological: Negative for light-headedness and headaches.  Psychiatric/Behavioral: Negative for agitation and behavioral problems.  All other systems reviewed and are negative.   Per HPI unless specifically indicated above   Allergies as of 06/09/2019      Reactions   Trilyte [peg [id number] Bicarb-nacl]    Nausea/vomtiing      Medication List       Accurate as of June 09, 2019 11:22 AM. If you have any questions, ask your nurse or doctor.        Accu-Chek Aviva Plus test strip Generic drug: glucose blood Teset BS BID Dx E11.9   Accu-Chek Aviva Plus w/Device Kit Test BS BID Dx E11.9   acetaminophen 500 MG tablet Commonly known as: TYLENOL Take 1,000 mg by mouth every 6 (six) hours as needed for mild pain or headache.   aspirin EC 81 MG tablet Take 81 mg by mouth daily.   atorvastatin 20 MG tablet Commonly known as: LIPITOR Take 1 tablet (20 mg total) by mouth daily.   B-D SINGLE USE SWABS REGULAR Pads USE TO CHECK BLOOD SUGAR TWICE DAILY AND AS NEEDED Dx E11.9   cholecalciferol 25 MCG (1000 UNIT) tablet Commonly known as: VITAMIN D TAKE 1 TABLET EVERY DAY   diclofenac Sodium 1 % Gel Commonly known as: VOLTAREN Apply 2 g topically 4 (four) times daily as needed for pain.   hydrALAZINE 10 MG tablet Commonly known as: APRESOLINE Take 1 tablet (10 mg total) by mouth 2 (two) times daily.   Janumet XR 50-500 MG Tb24 Generic drug:  SitaGLIPtin-MetFORMIN HCl Take 1 tablet by mouth 2 (two) times daily.   losartan-hydrochlorothiazide 50-12.5 MG tablet Commonly known as: HYZAAR Take 1 tablet by mouth daily.   metoprolol succinate 50 MG 24 hr tablet Commonly known as: TOPROL-XL TAKE 1 TABLET (50 MG TOTAL) BY MOUTH DAILY. TAKE WITH OR IMMEDIATELY FOLLOWING A MEAL.   nitroGLYCERIN 0.4 MG SL tablet Commonly known as: NITROSTAT DISSOLVE 1 TABLET UNDER THE TONGUE EVERY 5 MINUTES AS NEEDED FOR CHEST PAIN   omeprazole 40 MG capsule Commonly  known as: PRILOSEC Take 1 capsule (40 mg total) by mouth daily.   polyethylene glycol 17 g packet Commonly known as: MIRALAX / GLYCOLAX Take 17 g by mouth daily as needed.   Prodigy Twist Top Lancets 28G Misc TEST TWICE DAILY Dx E11.9   True Metrix Level 2 Normal Soln Use as needed with BS machine   TURMERIC PO Take by mouth daily.        Objective:   BP 131/76   Pulse 60   Temp 97.9 F (36.6 C)   Ht '5\' 3"'$  (1.6 m)   Wt 152 lb (68.9 kg)   SpO2 96%   BMI 26.93 kg/m   Wt Readings from Last 3 Encounters:  06/09/19 152 lb (68.9 kg)  03/31/19 156 lb (70.8 kg)  12/21/18 143 lb (64.9 kg)    Physical Exam Vitals and nursing note reviewed.  Constitutional:      General: She is not in acute distress.    Appearance: She is well-developed. She is not diaphoretic.  Eyes:     Conjunctiva/sclera: Conjunctivae normal.  Cardiovascular:     Rate and Rhythm: Normal rate and regular rhythm.     Heart sounds: Normal heart sounds. No murmur.  Pulmonary:     Effort: Pulmonary effort is normal. No respiratory distress.     Breath sounds: Normal breath sounds. No wheezing.  Musculoskeletal:        General: No tenderness. Normal range of motion.  Skin:    General: Skin is warm and dry.     Findings: No rash.  Neurological:     Mental Status: She is alert and oriented to person, place, and time.     Coordination: Coordination normal.  Psychiatric:        Behavior: Behavior normal.       Assessment & Plan:   Problem List Items Addressed This Visit      Cardiovascular and Mediastinum   Hypertension associated with diabetes ([place])     Digestive   GERD (gastroesophageal reflux disease)   Relevant Medications   polyethylene glycol (MIRALAX / GLYCOLAX) 17 g packet     Endocrine   Type 2 diabetes mellitus ([place]) - Primary   Relevant Orders   Bayer DCA Hb A1c Waived   Hyperlipidemia associated with type 2 diabetes mellitus ([place])      Continue current medication, looks  like her blood sugars are running between 130s and 160s in the morning but mostly in the low 130s, A1c 6.8, still controlled but just slightly, no medication changes at this point.  Patient is struggling somewhat with living by herself emotionally and the loss of her husband. Follow up plan: Return in about 3 months (around 09/09/2019), or if symptoms worsen or fail to improve, for Diabetes and hypertension recheck.  Counseling provided for all of the vaccine components Orders Placed This Encounter  Procedures  . Bayer G [facility] Hb A1c [place], MD [facility] 06/09/2019,  11:22 AM

## 2019-06-14 ENCOUNTER — Telehealth: Payer: Medicare HMO

## 2019-06-25 ENCOUNTER — Ambulatory Visit (INDEPENDENT_AMBULATORY_CARE_PROVIDER_SITE_OTHER): Payer: Medicare HMO

## 2019-06-25 DIAGNOSIS — Z0000 Encounter for general adult medical examination without abnormal findings: Secondary | ICD-10-CM | POA: Diagnosis not present

## 2019-06-25 NOTE — Progress Notes (Addendum)
MEDICARE ANNUAL WELLNESS VISIT  06/25/2019  Telephone Visit Disclaimer This Medicare AWV was conducted by telephone due to national recommendations for restrictions regarding the COVID-19 Pandemic (e.g. social distancing).  I verified, using two identifiers, that I am speaking with Tonya Carpenter or their authorized healthcare agent. I discussed the limitations, risks, security, and privacy concerns of performing an evaluation and management service by telephone and the potential availability of an in-person appointment in the future. The patient expressed understanding and agreed to proceed.   Subjective:  Tonya Carpenter is a 74 y.o. female patient of [name], Fransisca Kaufmann, MD who had a Medicare Annual Wellness Visit today via telephone. Tonya Carpenter is Retired and lives alone. she has two children. she reports that she is socially active and does interact with friends/family regularly. she is minimally physically active and enjoys going to church and walking.  Patient Care Team: [name], Fransisca Kaufmann, MD as PCP - General (Family Medicine) Minus Breeding, MD as PCP - Cardiology (Cardiology) Danie Binder, MD as Consulting Physician (Gastroenterology) Rutherford Guys, MD as Consulting Physician (Ophthalmology) Shea Evans, [name], LCSW as Social Worker (Licensed Clinical Social Worker) Ilean China, RN as Registered Nurse  Advanced Directives 06/25/2019 03/31/2019 12/21/2018 02/24/2018 01/14/2017 02/12/2016 01/10/2016  Does Patient Have a Medical Advance Directive? _0  No No  Would patient like information on creating a medical advance directive? No - Patient declined No - Patient declined - No - Patient declined No - Patient declined - No - patient declined information    Hospital Utilization Over the Past 12 Months: # of hospitalizations or ER visits: 0 # of surgeries: 0  Review of Systems    Patient reports that her overall health is unchanged compared to last year.   Patient Reported  Readings (BS- 118  Pain Assessment Pain : 0-10 Pain Score: 4  Pain Location: Shoulder Pain Orientation: Right Pain Descriptors / Indicators: Discomfort Pain Onset: More than a month ago Pain Frequency: Intermittent Pain Relieving Factors: Tylenol, Turmeric, Diclofenac gel  Pain Relieving Factors: Tylenol, Turmeric, Diclofenac gel  Current Medications & Allergies (verified) Allergies as of 06/25/2019       Reactions   Trilyte [peg [id number] Bicarb-nacl]    Nausea/vomtiing        Medication List        Accurate as of June 25, 2019 10:08 AM. If you have any questions, ask your nurse or doctor.          Accu-Chek Aviva Plus test strip Generic drug: glucose blood Teset BS BID Dx E11.9   Accu-Chek Aviva Plus w/Device Kit Test BS BID Dx E11.9   acetaminophen 500 MG tablet Commonly known as: TYLENOL Take 1,000 mg by mouth every 6 (six) hours as needed for mild pain or headache.   aspirin EC 81 MG tablet Take 81 mg by mouth daily.   atorvastatin 20 MG tablet Commonly known as: LIPITOR Take 1 tablet (20 mg total) by mouth daily.   B-D SINGLE USE SWABS REGULAR Pads USE TO CHECK BLOOD SUGAR TWICE DAILY AND AS NEEDED Dx E11.9   cholecalciferol 25 MCG (1000 UNIT) tablet Commonly known as: VITAMIN D TAKE 1 TABLET EVERY DAY   diclofenac Sodium 1 % Gel Commonly known as: VOLTAREN Apply 2 g topically 4 (four) times daily as needed for pain.   hydrALAZINE 10 MG tablet Commonly known as: APRESOLINE Take 1 tablet (10 mg total) by mouth 2 (two) times daily.   Janumet  XR 50-500 MG Tb24 Generic drug: SitaGLIPtin-MetFORMIN HCl Take 1 tablet by mouth 2 (two) times daily.   losartan-hydrochlorothiazide 50-12.5 MG tablet Commonly known as: HYZAAR Take 1 tablet by mouth daily.   metoprolol succinate 50 MG 24 hr tablet Commonly known as: TOPROL-XL TAKE 1 TABLET (50 MG TOTAL) BY MOUTH DAILY. TAKE WITH OR IMMEDIATELY FOLLOWING A MEAL.   nitroGLYCERIN 0.4 MG SL  tablet Commonly known as: NITROSTAT DISSOLVE 1 TABLET UNDER THE TONGUE EVERY 5 MINUTES AS NEEDED FOR CHEST PAIN   omeprazole 40 MG capsule Commonly known as: PRILOSEC Take 1 capsule (40 mg total) by mouth daily.   polyethylene glycol 17 g packet Commonly known as: MIRALAX / GLYCOLAX Take 17 g by mouth daily as needed.   Prodigy Twist Top Lancets 28G Misc TEST TWICE DAILY Dx E11.9   True Metrix Level 2 Normal Soln Use as needed with BS machine   TURMERIC PO Take by mouth daily.        History (reviewed): Past Medical History:  Diagnosis Date   Anxiety    Arthritis    "hands" (01/14/2013)   Asthma    Cataract    Coronary artery disease    Non obstructive CAD 2014 cath.    Exertional shortness of breath    Hyperlipidemia    Hypertension    Stroke ([place])    "light stroke long years ago; didn't affect me permanently" (01/14/2013)   Type 2 diabetes mellitus [facility])    Past Surgical History:  Procedure Laterality Date   APPENDECTOMY     CARDIAC CATHETERIZATION  2008   no angiographically significant CAD   CARDIOVASCULAR STRESS TEST  12/2009   no evidence for stress-induced reversibility or ischemia.  She had a fixed anterior septal wall defect, possibly related to breast attenuation and her EF was 54%.   CATARACT EXTRACTION W/PHACO Right 10/25/2014   Procedure: CATARACT EXTRACTION PHACO AND INTRAOCULAR LENS PLACEMENT; CDE:  17.28;  Surgeon: Rutherford Guys, MD;  Location: [facility];  Service: Ophthalmology;  Laterality: Right;   CATARACT EXTRACTION W/PHACO Left 11/08/2014   Procedure: CATARACT EXTRACTION PHACO AND INTRAOCULAR LENS PLACEMENT (IOC);  Surgeon: Rutherford Guys, MD;  Location: [facility];  Service: Ophthalmology;  Laterality: Left;  CDE: 5.94   CHOLECYSTECTOMY     COLONOSCOPY N/A 03/31/2019   Procedure: COLONOSCOPY;  Surgeon: Danie Binder, MD;  Location: [facility] SUITE;  Service: Endoscopy;  Laterality: N/A;  10:30   DILATION AND CURETTAGE OF UTERUS     LEFT HEART  CATHETERIZATION WITH CORONARY ANGIOGRAM N/A 01/14/2013   Procedure: LEFT HEART CATHETERIZATION WITH CORONARY ANGIOGRAM;  Surgeon: Burnell Blanks, MD;  Location: [facility] CATH LAB;  Service: Cardiovascular;  Laterality: N/A;   TONSILLECTOMY     TUBAL LIGATION     VAGINAL HYSTERECTOMY     Family History  Problem Relation Age of Onset   Hypertension Mother    Hypertension Father    Cancer Sister        breast   Heart Problems Son    Seizures Son    Social History   Socioeconomic History   Marital status: Widowed    Spouse name: Not on file   Number of children: 2   Years of education: Not on file   Highest education level: Not on file  Occupational History   Occupation: Disability    Employer: UNEMPLOYED  Tobacco Use   Smoking status: Never Smoker   Smokeless tobacco: Never Used  Substance and Sexual Activity  Alcohol use: No   Drug use: No   Sexual activity: Not on file  Other Topics Concern   Not on file  Social History Narrative   Lives in [place], [place].    Social Determinants of Health   Financial Resource Strain: Medium Risk   Difficulty of Paying Living Expenses: Somewhat hard  Food Insecurity: No Food Insecurity   Worried About Charity fundraiser in the Last Year: Never true   Ran Out of Food in the Last Year: Never true  Transportation Needs: Unmet Transportation Needs   Lack of Transportation (Medical): Yes   Lack of Transportation (Non-Medical): Not on file  Physical Activity: Sufficiently Active   Days of Exercise per Week: 7 days   Minutes of Exercise per Session: 60 min  Stress: Stress Concern Present   Feeling of Stress : Rather much  Social Connections: Slightly Isolated   Frequency of Communication with Friends and Family: More than three times a week   Frequency of Social Gatherings with Friends and Family: More than three times a week   Attends Religious Services: More than 4 times per year   Active Member of [employer] or Organizations: Yes    Attends Archivist Meetings: More than 4 times per year   Marital Status: Widowed    Activities of Daily Living In your present state of health, do you have any difficulty performing the following activities: 06/25/2019  Hearing? N  Vision? N  Difficulty concentrating or making decisions? Y  Walking or climbing stairs? N  Dressing or bathing? N  Doing errands, shopping? Y  Comment Pt no longer drives. She walks to the dollar store. Her sons visit often  Preparing Food and eating ? N  Using the Toilet? N  In the past six months, have you accidently leaked urine? N  Do you have problems with loss of bowel control? N  Managing your Medications? N  Managing your Finances? N  Housekeeping or managing your Housekeeping? N  Some recent data might be hidden    Patient Education/ Literacy How often do you need to have someone help you when you read instructions, pamphlets, or other written materials from your doctor or pharmacy?: 4 - Often What is the last grade level you completed in school?: 12  Exercise Current Exercise Habits: Home exercise routine, Type of exercise: walking, Time (Minutes): 30, Frequency (Times/Week): 7, Weekly Exercise (Minutes/Week): 210, Intensity: Mild, Exercise limited by: neurologic condition(s);orthopedic condition(s)  Diet Patient reports consuming 3 meals a day and 2 snack(s) a day Patient reports that her primary diet is: Low fat Patient reports that she does have regular access to food.   Depression Screen PHQ 2/9 Scores 06/25/2019 06/09/2019 12/31/2018 12/09/2018 12/03/2018 10/07/2018 09/28/2018  PHQ - 2 Score 0 0 1 0 2 2 0  PHQ- 9 Score - - - - 6 6 -  Exception Documentation - - - - - - -  Not completed - - - - - - -     Fall Risk Fall Risk  06/25/2019 06/09/2019 12/09/2018 12/03/2018 01/29/2018  Falls in the past year? 0 0 0 0 0  Number falls in past yr: - - - - -  Injury with Fall? - - - - -  Risk Factor Category  - - - - -  Risk for fall due  to : - - - - -  Follow up - - - - -     Objective:  Tonya Carpenter seemed alert  and oriented and she participated appropriately during our telephone visit.  Blood Pressure Weight BMI  BP Readings from Last 3 Encounters:  06/09/19 131/76  03/31/19 (!) 111/52  12/21/18 (!) 184/77   Wt Readings from Last 3 Encounters:  06/09/19 152 lb (68.9 kg)  03/31/19 156 lb (70.8 kg)  12/21/18 143 lb (64.9 kg)   BMI Readings from Last 1 Encounters:  06/09/19 26.93 kg/m    *Unable to obtain current vital signs, weight, and BMI due to telephone visit type  Hearing/Vision  Tonya Carpenter did not seem to have difficulty with hearing/understanding during the telephone conversation Reports that she has had a formal eye exam by an eye care professional within the past year Reports that she has not had a formal hearing evaluation within the past year *Unable to fully assess hearing and vision during telephone visit type  Cognitive Function: 6CIT Screen 06/25/2019 02/24/2018  What Year? 0 points 0 points  What month? 0 points 0 points  What time? 0 points 0 points  Count back from 20 4 points 4 points  Months in reverse 4 points 4 points  Repeat phrase 10 points 2 points  Total Score 18 -   (Normal:0-7, Significant for Dysfunction: >8)  Normal Cognitive Function Screening: No: 18   Immunization & Health Maintenance Record Immunization History  Administered Date(s) Administered   Influenza, High Dose Seasonal PF 12/22/2017   Influenza,inj,Quad PF,6+ Mos 12/18/2012, 02/02/2014, 01/03/2015, 01/10/2016, 01/13/2017   Influenza,inj,quad, With Preservative 12/22/2018   Pneumococcal Conjugate-13 12/23/2012   Pneumococcal Polysaccharide-23 05/25/2014   Tdap 07/19/2013   Zoster 06/04/2013   Zoster Recombinat (Shingrix) 02/24/2018    Health Maintenance  Topic Date Due   OPHTHALMOLOGY EXAM  09/12/2019 (Originally 03/25/2018)   FOOT EXAM  09/13/2019 (Originally 10/03/2015)   INFLUENZA VACCINE  10/17/2019    HEMOGLOBIN A1C  12/10/2019   MAMMOGRAM  04/26/2020   TETANUS/TDAP  07/20/2023   COLONOSCOPY  03/30/2029   DEXA SCAN  Completed   Hepatitis C Screening  Completed   PNA vac Low Risk Adult  Completed       Assessment  This is a routine wellness examination for Tonya Carpenter.  Health Maintenance: Due or Overdue There are no preventive care reminders to display for this patient.  Tonya Carpenter does not need a referral for [employer] Assistance: Care Management:   no Social Work:    no Prescription Assistance:  no Nutrition/Diabetes Education:  no   Plan:  Personalized Goals  Shoulder pain management  Continue to exercise/walking  Follow up with cardiologist and optometrist  Personalized Health Maintenance & Screening Recommendations   Lung Cancer Screening Recommended: no (Low Dose CT Chest recommended if Age 73-80 years, 30 pack-year currently smoking OR have quit w/in past 15 years) Hepatitis C Screening recommended: no HIV Screening recommended: no  Advanced Directives: Written information was not prepared per patient's request.  Referrals & Orders No orders of the defined types were placed in this encounter.   Follow-up Plan Follow-up with [name], Fransisca Kaufmann, MD as planned Schedule 09/22/2019    I have personally reviewed and noted the following in the patient's chart:   Medical and social history Use of alcohol, tobacco or illicit drugs  Current medications and supplements Functional ability and status Nutritional status Physical activity Advanced directives List of other physicians Hospitalizations, surgeries, and ER visits in previous 12 months Vitals Screenings to include cognitive, depression, and falls Referrals and appointments  In addition, I have reviewed and discussed with  Tonya Carpenter certain preventive protocols, quality metrics, and best practice recommendations. A written personalized care plan for preventive services as well as general  preventive health recommendations is available and can be mailed to the patient at her request.      Alphonzo Dublin  06/25/2019    I have reviewed and agree with the above AWV documentation.   Evelina Dun, FNP

## 2019-06-29 ENCOUNTER — Ambulatory Visit (INDEPENDENT_AMBULATORY_CARE_PROVIDER_SITE_OTHER): Payer: Medicare HMO | Admitting: Licensed Clinical Social Worker

## 2019-06-29 DIAGNOSIS — I152 Hypertension secondary to endocrine disorders: Secondary | ICD-10-CM

## 2019-06-29 DIAGNOSIS — E1159 Type 2 diabetes mellitus with other circulatory complications: Secondary | ICD-10-CM

## 2019-06-29 DIAGNOSIS — E785 Hyperlipidemia, unspecified: Secondary | ICD-10-CM

## 2019-06-29 DIAGNOSIS — I10 Essential (primary) hypertension: Secondary | ICD-10-CM | POA: Diagnosis not present

## 2019-06-29 DIAGNOSIS — E1122 Type 2 diabetes mellitus with diabetic chronic kidney disease: Secondary | ICD-10-CM | POA: Diagnosis not present

## 2019-06-29 DIAGNOSIS — E1169 Type 2 diabetes mellitus with other specified complication: Secondary | ICD-10-CM | POA: Diagnosis not present

## 2019-06-29 DIAGNOSIS — K2100 Gastro-esophageal reflux disease with esophagitis, without bleeding: Secondary | ICD-10-CM

## 2019-06-29 DIAGNOSIS — N183 Chronic kidney disease, stage 3 unspecified: Secondary | ICD-10-CM

## 2019-06-29 DIAGNOSIS — I2510 Atherosclerotic heart disease of native coronary artery without angina pectoris: Secondary | ICD-10-CM

## 2019-06-29 NOTE — Chronic Care Management (AMB) (Signed)
Chronic Care Management    Clinical Social Work Follow Up Note  06/29/2019 Name: Tonya Carpenter MRN: [id number] DOB: 1945-06-26  Tonya Carpenter is a 74 y.o. year old female who is a primary care patient of [name], Fransisca Kaufmann, MD. The CCM team was consulted for assistance with [name] .   Review of patient status, including review of consultants reports, other relevant assessments, and collaboration with appropriate care team members and the patient's provider was performed as part of comprehensive patient evaluation and provision of chronic care management services.    SDOH (Social Determinants of Health) assessments performed: Yes; risk of social isolation; risk of financial strain; risk of stress; risk of transport needs    Office Visit from 12/03/2018 in [place]  PHQ-9 Total Score  6     GAD 7 : Generalized Anxiety Score 10/07/2018  Nervous, Anxious, on Edge 1  Control/stop worrying 1  Worry too much - different things 0  Trouble relaxing 1  Restless 0  Easily annoyed or irritable 0  Afraid - awful might happen 0  Total GAD 7 Score 3  Anxiety Difficulty Somewhat difficult    Outpatient Encounter Medications as of 06/29/2019  Medication Sig  . acetaminophen (TYLENOL) 500 MG tablet Take 1,000 mg by mouth every 6 (six) hours as needed for mild pain or headache.   . Alcohol Swabs (B-D SINGLE USE SWABS REGULAR) PADS USE TO CHECK BLOOD SUGAR TWICE DAILY AND AS NEEDED Dx E11.9  . aspirin EC 81 MG tablet Take 81 mg by mouth daily.  Marland Kitchen atorvastatin (LIPITOR) 20 MG tablet Take 1 tablet (20 mg total) by mouth daily.  . Blood Glucose Calibration (TRUE METRIX LEVEL 2) Normal SOLN Use as needed with BS machine  . Blood Glucose Monitoring Suppl (ACCU-CHEK AVIVA PLUS) w/Device KIT Test BS BID Dx E11.9  . cholecalciferol (VITAMIN D) 25 MCG (1000 UT) tablet TAKE 1 TABLET EVERY DAY (Patient taking differently: Take 1,000 Units by mouth daily. )  . diclofenac Sodium  (VOLTAREN) 1 % GEL Apply 2 g topically 4 (four) times daily as needed for pain.  Marland Kitchen glucose blood (ACCU-CHEK AVIVA PLUS) test strip Teset BS BID Dx E11.9  . hydrALAZINE (APRESOLINE) 10 MG tablet Take 1 tablet (10 mg total) by mouth 2 (two) times daily.  Marland Kitchen JANUMET XR 50-500 MG TB24 Take 1 tablet by mouth 2 (two) times daily.  Marland Kitchen losartan-hydrochlorothiazide (HYZAAR) 50-12.5 MG tablet Take 1 tablet by mouth daily.  . metoprolol succinate (TOPROL-XL) 50 MG 24 hr tablet TAKE 1 TABLET (50 MG TOTAL) BY MOUTH DAILY. TAKE WITH OR IMMEDIATELY FOLLOWING A MEAL.  . nitroGLYCERIN (NITROSTAT) 0.4 MG SL tablet DISSOLVE 1 TABLET UNDER THE TONGUE EVERY 5 MINUTES AS NEEDED FOR CHEST PAIN  . omeprazole (PRILOSEC) 40 MG capsule Take 1 capsule (40 mg total) by mouth daily.  . polyethylene glycol (MIRALAX / GLYCOLAX) 17 g packet Take 17 g by mouth daily as needed.  . Prodigy Twist Top Lancets 28G MISC TEST TWICE DAILY Dx E11.9  . TURMERIC PO Take by mouth daily.   No facility-administered encounter medications on file as of 06/29/2019.    Goals    . Client wants to talk with LCSW about mannaging streess issues faced (pt-stated)     Current Barriers:  . Financial challenges in client with Chronic Diagnoses of GERD, CAD, TYpe 2 DM, HLD, HTN . Transport challenge  Clinical Social Work Clinical Goal(s):  . Client to talk with LCSW  in next 30 days about client managing stress issues  Interventions:   Talked with client about support with RNCM for nursing needs  Talked with client about client completion of ADLs  [place] client about home health needs of client  Talked with client about transport needs of client  Talked with client about PACE program  Talked with client about applying for Medicaid and talked with client about PCS services through Medicaid benefit  Talked with [name] about ambulation needs of client Talked with [name] about her upcoming medical appointments Talked with [name] about  Meals on Wheels support and putting her name on Meals on Wheels waiting list  Patient Self Care Activities:  . Takes medications as prescribed . Attends scheduled medical appointments  Plan: Client to attend medical appointments LCSW to call client in next 4 weeks to discuss psychosocial needs of client Client to talk with [facility] about resources    Initial goal documentation    Follow Up Plan: LCSW to call client in next 4 weeks to talk with client about the psychosocial needs of client at that time  [name].Nirvaan Frett MSW, LCSW Licensed Clinical Social Worker [facility] Care Management [phone]

## 2019-06-29 NOTE — Patient Instructions (Addendum)
Licensed Clinical Social Worker Visit Information  Goals we discussed today:  Goals    . Client wants to talk with LCSW about mannaging streess issues faced (pt-stated)     Current Barriers:  . Financial challenges in client with Chronic Diagnoses of GERD, CAD, TYpe 2 DM, HLD, HTN . Transport challenge  Clinical Social Work Clinical Goal(s):  . Client to talk with LCSW in next 30 days about client managing stress issues  Interventions: Talked with client about support with RNCM for nursing needs  Talked with client about client completion of ADLs  Talked with client about home health needs of client  Talked with client about transport needs of client Talked with client about PACE program Talked with client about applying for Medicaid and talked with client              about PCS services through Medicaid benefit  Talked with [name] about ambulation needs of client  Talked with [name] about her upcoming medical appointments  Talked with [name] about Meals on Wheels support and putting her name on Meals on Wheels waiting list  Patient Self Care Activities:  . Takes medications as prescribed . Attends scheduled medical appointments  Plan: Client to attend medical appointments LCSW to call client in next 4 weeks to discuss psychosocial needs of client Client to talk with [facility] about resources  Initial goal documentation      Materials Provided: No  Follow Up Plan: LCSW to call client in next 4 weeks to discuss the psychosocial needs of client at that time  The patient verbalized understanding of instructions provided today and declined a print copy of patient instruction materials.   Tonya Carpenter MSW, LCSW Licensed Clinical Social Worker [facility] Care [facility] [phone]

## 2019-07-01 ENCOUNTER — Telehealth: Payer: Self-pay | Admitting: Family Medicine

## 2019-07-01 DIAGNOSIS — R0789 Other chest pain: Secondary | ICD-10-CM

## 2019-07-01 DIAGNOSIS — I2510 Atherosclerotic heart disease of native coronary artery without angina pectoris: Secondary | ICD-10-CM

## 2019-07-01 NOTE — Telephone Encounter (Signed)
  REFERRAL REQUEST Telephone Note 07/01/2019  What type of referral do you need? Cardiologist because sometimes she has some discomfort and then she is ok. Wants [name] to look and see if she needs to go back to see him.  Have you been seen at our office for this problem? Yes (Advise that they may need an appointment with their PCP before a referral can be done)  Is there a particular doctor or location that you prefer? [facility] in [place]  Patient notified that referrals can take up to a week or longer to process. If they haven't heard anything within a week they should call back and speak with the referral department.

## 2019-07-06 NOTE — Telephone Encounter (Signed)
Placed referral for the patient to Dr. Antoine Poche

## 2019-07-07 ENCOUNTER — Other Ambulatory Visit: Payer: Self-pay | Admitting: *Deleted

## 2019-07-07 DIAGNOSIS — E785 Hyperlipidemia, unspecified: Secondary | ICD-10-CM

## 2019-07-07 DIAGNOSIS — E1169 Type 2 diabetes mellitus with other specified complication: Secondary | ICD-10-CM

## 2019-07-07 MED ORDER — ATORVASTATIN CALCIUM 20 MG PO TABS: 20.0000 mg | ORAL_TABLET | Freq: Every day | ORAL | 0 refills | Status: DC

## 2019-07-20 DIAGNOSIS — I447 Left bundle-branch block, unspecified: Secondary | ICD-10-CM | POA: Insufficient documentation

## 2019-07-20 DIAGNOSIS — Z7189 Other specified counseling: Secondary | ICD-10-CM | POA: Insufficient documentation

## 2019-07-20 NOTE — Progress Notes (Signed)
Cardiology Office Note   Date:  07/21/2019   ID:  Tonya, Carpenter 1945-09-03, MRN [id number]  PCP:  [name], Fransisca Kaufmann, MD  Cardiologist:   Minus Breeding, MD Referring:  [name], Fransisca Kaufmann, MD  Chief Complaint  Patient presents with  . Abnormal ECG      History of Present Illness: ONIYA MANDARINO is a 74 y.o. female who is referred by [name], Fransisca Kaufmann, MD for evaluation of chest pain.  She has had cardiac work up in the past. Echo in 2011 demonstrated a low normal EF. There was a small pericardial effusion. Cardiac cath in 2014 demonstrated only mild coronary plaque.  Since I last saw her she has done well.  She worries a lot. The patient denies any new symptoms such as chest discomfort, neck or arm discomfort. There has been no new shortness of breath, PND or orthopnea. There have been no reported palpitations, presyncope or syncope.  She does a lot of walking to get to the stores in town.  She has no problems with this.   Past Medical History:  Diagnosis Date  . Anxiety   . Arthritis    "hands" (01/14/2013)  . Asthma   . Cataract   . Coronary artery disease    Non obstructive CAD 2014 cath.   . Exertional shortness of breath   . Hyperlipidemia   . Hypertension   . Stroke [facility])    "light stroke long years ago; didn't affect me permanently" (01/14/2013)  . Type 2 diabetes mellitus ([place])     Past Surgical History:  Procedure Laterality Date  . APPENDECTOMY    . CARDIAC CATHETERIZATION  2008   no angiographically significant CAD  . CARDIOVASCULAR STRESS TEST  12/2009   no evidence for stress-induced reversibility or ischemia.  She had a fixed anterior septal wall defect, possibly related to breast attenuation and her EF was 54%.  Marland Kitchen CATARACT EXTRACTION W/PHACO Right 10/25/2014   Procedure: CATARACT EXTRACTION PHACO AND INTRAOCULAR LENS PLACEMENT; CDE:  17.28;  Surgeon: Rutherford Guys, MD;  Location: [facility] ORS;  Service: Ophthalmology;  Laterality: Right;  . CATARACT  EXTRACTION W/PHACO Left 11/08/2014   Procedure: CATARACT EXTRACTION PHACO AND INTRAOCULAR LENS PLACEMENT (IOC);  Surgeon: Rutherford Guys, MD;  Location: [facility] ORS;  Service: Ophthalmology;  Laterality: Left;  CDE: 5.94  . CHOLECYSTECTOMY    . COLONOSCOPY N/A 03/31/2019   Procedure: COLONOSCOPY;  Surgeon: Danie Binder, MD;  Location: [facility] ENDO SUITE;  Service: Endoscopy;  Laterality: N/A;  10:30  . DILATION AND CURETTAGE OF UTERUS    . LEFT HEART CATHETERIZATION WITH CORONARY ANGIOGRAM N/A 01/14/2013   Procedure: LEFT HEART CATHETERIZATION WITH CORONARY ANGIOGRAM;  Surgeon: Burnell Blanks, MD;  Location: [facility] CATH LAB;  Service: Cardiovascular;  Laterality: N/A;  . TONSILLECTOMY    . TUBAL LIGATION    . VAGINAL HYSTERECTOMY       Current Outpatient Medications  Medication Sig Dispense Refill  . acetaminophen (TYLENOL) 500 MG tablet Take 1,000 mg by mouth every 6 (six) hours as needed for mild pain or headache.     Marland Kitchen aspirin EC 81 MG tablet Take 81 mg by mouth daily.    Marland Kitchen atorvastatin (LIPITOR) 20 MG tablet Take 1 tablet (20 mg total) by mouth daily. 90 tablet 0  . diclofenac Sodium (VOLTAREN) 1 % GEL Apply 2 g topically 4 (four) times daily as needed for pain.    . hydrALAZINE (APRESOLINE) 10 MG tablet Take 1 tablet (  10 mg total) by mouth 2 (two) times daily. 180 tablet 3  . JANUMET XR 50-500 MG TB24 Take 1 tablet by mouth 2 (two) times daily. 180 tablet 3  . losartan-hydrochlorothiazide (HYZAAR) 50-12.5 MG tablet Take 1 tablet by mouth daily. 90 tablet 3  . metoprolol succinate (TOPROL-XL) 50 MG 24 hr tablet TAKE 1 TABLET (50 MG TOTAL) BY MOUTH DAILY. TAKE WITH OR IMMEDIATELY FOLLOWING A MEAL. 90 tablet 3  . nitroGLYCERIN (NITROSTAT) 0.4 MG SL tablet DISSOLVE 1 TABLET UNDER THE TONGUE EVERY 5 MINUTES AS NEEDED FOR CHEST PAIN 25 tablet 0  . omeprazole (PRILOSEC) 40 MG capsule Take 1 capsule (40 mg total) by mouth daily. 90 capsule 1  . polyethylene glycol (MIRALAX / GLYCOLAX) 17 g packet  Take 17 g by mouth daily as needed.    . Alcohol Swabs (B-D SINGLE USE SWABS REGULAR) PADS USE TO CHECK BLOOD SUGAR TWICE DAILY AND AS NEEDED Dx E11.9 300 each 3  . Blood Glucose Calibration (TRUE METRIX LEVEL 2) Normal SOLN Use as needed with BS machine 1 each 3  . Blood Glucose Monitoring Suppl (ACCU-CHEK AVIVA PLUS) w/Device KIT Test BS BID Dx E11.9 1 kit 0  . glucose blood (ACCU-CHEK AVIVA PLUS) test strip Teset BS BID Dx E11.9 200 each 3  . Prodigy Twist Top Lancets 28G MISC TEST TWICE DAILY Dx E11.9 200 each 3   No current facility-administered medications for this visit.    Allergies:   Trilyte [peg [id number] bicarb-nacl]    ROS:  Please see the history of present illness.   Otherwise, review of systems are positive for none.   All other systems are reviewed and negative.    PHYSICAL EXAM: VS:  BP 122/70   Pulse 62   Ht 5' 2"  (1.575 m)   Wt 158 lb (71.7 kg)   BMI 28.90 kg/m  , BMI Body mass index is 28.9 kg/m. GENERAL:  Well appearing NECK:  No jugular venous distention, waveform within normal limits, carotid upstroke brisk and symmetric, no bruits, no thyromegaly LUNGS:  Clear to auscultation bilaterally CHEST:  Unremarkable HEART:  PMI not displaced or sustained,S1 and S2 within normal limits, no S3, no S4, no clicks, no rubs, no murmurs ABD:  Flat, positive bowel sounds normal in frequency in pitch, no bruits, no rebound, no guarding, no midline pulsatile mass, no hepatomegaly, no splenomegaly EXT:  2 plus pulses throughout, no edema, no cyanosis no clubbing   EKG:  EKG is  ordered today. Sinus rhythm, rate 62 left bundle branch block, no acute ST-T wave changes.  Recent Labs: 07/31/2018: ALT 17; BUN 15; Creatinine, Ser 1.12; Hemoglobin 12.4; Platelets 237; Potassium 3.9; Sodium 143 12/03/2018: TSH 0.977    Lipid Panel    Component Value Date/Time   CHOL 180 07/31/2018 1251   CHOL 148 07/10/2012 1147   TRIG 100 07/31/2018 1251   TRIG 85 07/10/2012 1147   HDL  69 07/31/2018 1251   HDL 51 07/10/2012 1147   CHOLHDL 2.6 07/31/2018 1251   LDLCALC 91 07/31/2018 1251   LDLCALC 80 07/10/2012 1147      Wt Readings from Last 3 Encounters:  07/21/19 158 lb (71.7 kg)  06/09/19 152 lb (68.9 kg)  03/31/19 156 lb (70.8 kg)      Other studies Reviewed: Additional studies/ records that were reviewed today include: Labs. Review of the above records demonstrates:  Please see elsewhere in the note.     ASSESSMENT AND PLAN:  CHEST PAIN:  She had no further chest discomfort.  No further work-up is indicated.   HTN: Blood pressure is controlled.  No change in therapy.   DYSLIPIDEMIA: LDL was 91 with an HDL of 69.  No change in therapy.   DM: A1c was 6.8.  No change in therapy.   LBBB:   This is this is chronic.  She has had a mildly reduced ejection fraction of 50 to 55% on echo 11 years ago.  I like to check this again.   COVID EDUCATION: She has had her vaccine.  Current medicines are reviewed at length with the patient today.  The patient does not have concerns regarding medicines.  The following changes have been made:   None  Labs/ tests ordered today include:     Orders Placed This Encounter  Procedures  . EKG 12-Lead  . ECHOCARDIOGRAM COMPLETE     Disposition:   FU with me in about 2 years.  Signed, Minus Breeding, MD  07/21/2019 11:28 AM    [facility]

## 2019-07-21 ENCOUNTER — Ambulatory Visit (INDEPENDENT_AMBULATORY_CARE_PROVIDER_SITE_OTHER): Payer: Medicare HMO | Admitting: Cardiology

## 2019-07-21 ENCOUNTER — Encounter: Payer: Self-pay | Admitting: Cardiology

## 2019-07-21 ENCOUNTER — Other Ambulatory Visit: Payer: Self-pay

## 2019-07-21 VITALS — BP 122/70 | HR 62 | Ht 62.0 in | Wt 158.0 lb

## 2019-07-21 DIAGNOSIS — E118 Type 2 diabetes mellitus with unspecified complications: Secondary | ICD-10-CM | POA: Diagnosis not present

## 2019-07-21 DIAGNOSIS — I10 Essential (primary) hypertension: Secondary | ICD-10-CM

## 2019-07-21 DIAGNOSIS — I447 Left bundle-branch block, unspecified: Secondary | ICD-10-CM | POA: Diagnosis not present

## 2019-07-21 DIAGNOSIS — R072 Precordial pain: Secondary | ICD-10-CM

## 2019-07-21 DIAGNOSIS — Z7189 Other specified counseling: Secondary | ICD-10-CM | POA: Diagnosis not present

## 2019-07-21 DIAGNOSIS — E785 Hyperlipidemia, unspecified: Secondary | ICD-10-CM | POA: Diagnosis not present

## 2019-07-21 NOTE — Patient Instructions (Signed)
Medication Instructions:  The current medical regimen is effective;  continue present plan and medications.  *If you need a refill on your cardiac medications before your next appointment, please call your pharmacy*  Testing/Procedures: Your physician has requested that you have an echocardiogram. Echocardiography is a painless test that uses sound waves to create images of your heart. It provides your doctor with information about the size and shape of your heart and how well your heart's chambers and valves are working. This procedure takes approximately one hour. There are no restrictions for this procedure.  Follow-Up: At [facility], you and your health needs are our priority.  As part of our continuing mission to provide you with exceptional heart care, we have created designated Provider Care Teams.  These Care Teams include your primary Cardiologist (physician) and Advanced Practice Providers (APPs -  Physician Assistants and Nurse Practitioners) who all work together to provide you with the care you need, when you need it.  We recommend signing up for the patient portal called "MyChart".  Sign up information is provided on this After Visit Summary.  MyChart is used to connect with patients for Virtual Visits (Telemedicine).  Patients are able to view lab/test results, encounter notes, upcoming appointments, etc.  Non-urgent messages can be sent to your provider as well.   To learn more about what you can do with MyChart, go to [email or web address].    Your next appointment:   2 year(s)  The format for your next appointment:   In Person  Provider:   James Hochrein, MD   Thank you for choosing [facility]!!     

## 2019-07-29 ENCOUNTER — Ambulatory Visit: Payer: Medicare HMO | Admitting: Licensed Clinical Social Worker

## 2019-07-29 DIAGNOSIS — E1169 Type 2 diabetes mellitus with other specified complication: Secondary | ICD-10-CM

## 2019-07-29 DIAGNOSIS — K2100 Gastro-esophageal reflux disease with esophagitis, without bleeding: Secondary | ICD-10-CM

## 2019-07-29 DIAGNOSIS — I2510 Atherosclerotic heart disease of native coronary artery without angina pectoris: Secondary | ICD-10-CM

## 2019-07-29 DIAGNOSIS — E1122 Type 2 diabetes mellitus with diabetic chronic kidney disease: Secondary | ICD-10-CM

## 2019-07-29 DIAGNOSIS — E785 Hyperlipidemia, unspecified: Secondary | ICD-10-CM

## 2019-07-29 DIAGNOSIS — E1159 Type 2 diabetes mellitus with other circulatory complications: Secondary | ICD-10-CM

## 2019-07-29 NOTE — Patient Instructions (Addendum)
Licensed Clinical Social Worker Visit Information  Materials Provided: No  07/29/2019   Name: Tonya Carpenter MRN: [id number] DOB: [birth year]   Tonya Carpenter is a 74 y.o. year old female who is a primary care patient of [name], Elige Radon, MD. The CCM team was consulted for assistance with [employer] .   Review of patient status, including review of consultants reports, other relevant assessments, and collaboration with appropriate care team members and the patient's provider was performed as part of comprehensive patient evaluation and provision of chronic care management services.   SDOH (Social Determinants of Health) assessments performed: Yes;risk for social isolation; risk for financial strain; risk for stress; risk for transport needs   LCSW talked with client via phone today.  Client and LCSW spoke briefly about client needs. Client said she had been to a funeral today.  LCSW and RNCM have spoken recently with client about PCS services. RNCM has requested that a PCS application be completed for client and faxed to [facility]. Client has said she would be interested in having some weekly in home care assistance.   Follow Up Plan:LCSW to call client in next 4 weeks to talk with client about the psychosocial needs of client at that time  The patient verbalized understanding of instructions provided today and declined a print copy of patient instruction materials.   Tonya Carpenter MSW, LCSW Licensed Clinical Social Worker [facility] Care Management [phone]

## 2019-07-29 NOTE — Chronic Care Management (AMB) (Signed)
Chronic Care Management    Clinical Social Work Follow Up Note  07/29/2019 Name: Tonya Carpenter MRN: [id number] DOB: 07-Dec-1945  Tonya Carpenter is a 74 y.o. year old female who is a primary care patient of [name], Fransisca Kaufmann, MD. The CCM team was consulted for assistance with [name] .   Review of patient status, including review of consultants reports, other relevant assessments, and collaboration with appropriate care team members and the patient's provider was performed as part of comprehensive patient evaluation and provision of chronic care management services.    SDOH (Social Determinants of Health) assessments performed: Yes;risk for social isolation; risk for financial strain; risk for stress; risk for transport needs    Office Visit from 12/03/2018 in [place]  PHQ-9 Total Score  6     GAD 7 : Generalized Anxiety Score 10/07/2018  Nervous, Anxious, on Edge 1  Control/stop worrying 1  Worry too much - different things 0  Trouble relaxing 1  Restless 0  Easily annoyed or irritable 0  Afraid - awful might happen 0  Total GAD 7 Score 3  Anxiety Difficulty Somewhat difficult    Outpatient Encounter Medications as of 07/29/2019  Medication Sig  . acetaminophen (TYLENOL) 500 MG tablet Take 1,000 mg by mouth every 6 (six) hours as needed for mild pain or headache.   . Alcohol Swabs (B-D SINGLE USE SWABS REGULAR) PADS USE TO CHECK BLOOD SUGAR TWICE DAILY AND AS NEEDED Dx E11.9  . aspirin EC 81 MG tablet Take 81 mg by mouth daily.  Marland Kitchen atorvastatin (LIPITOR) 20 MG tablet Take 1 tablet (20 mg total) by mouth daily.  . Blood Glucose Calibration (TRUE METRIX LEVEL 2) Normal SOLN Use as needed with BS machine  . Blood Glucose Monitoring Suppl (ACCU-CHEK AVIVA PLUS) w/Device KIT Test BS BID Dx E11.9  . diclofenac Sodium (VOLTAREN) 1 % GEL Apply 2 g topically 4 (four) times daily as needed for pain.  Marland Kitchen glucose blood (ACCU-CHEK AVIVA PLUS) test strip Teset BS  BID Dx E11.9  . hydrALAZINE (APRESOLINE) 10 MG tablet Take 1 tablet (10 mg total) by mouth 2 (two) times daily.  Marland Kitchen JANUMET XR 50-500 MG TB24 Take 1 tablet by mouth 2 (two) times daily.  Marland Kitchen losartan-hydrochlorothiazide (HYZAAR) 50-12.5 MG tablet Take 1 tablet by mouth daily.  . metoprolol succinate (TOPROL-XL) 50 MG 24 hr tablet TAKE 1 TABLET (50 MG TOTAL) BY MOUTH DAILY. TAKE WITH OR IMMEDIATELY FOLLOWING A MEAL.  . nitroGLYCERIN (NITROSTAT) 0.4 MG SL tablet DISSOLVE 1 TABLET UNDER THE TONGUE EVERY 5 MINUTES AS NEEDED FOR CHEST PAIN  . omeprazole (PRILOSEC) 40 MG capsule Take 1 capsule (40 mg total) by mouth daily.  . polyethylene glycol (MIRALAX / GLYCOLAX) 17 g packet Take 17 g by mouth daily as needed.  . Prodigy Twist Top Lancets 28G MISC TEST TWICE DAILY Dx E11.9   No facility-administered encounter medications on file as of 07/29/2019.    LCSW talked with client via phone today.  Client and LCSW spoke briefly about client needs. Client said she had been to a funeral today.  LCSW and RNCM have spoken recently with client about PCS services. RNCM has requested that a PCS application be completed for client and faxed to [facility]. Client has said she would be interested in having some weekly in home care assistance.   Follow Up Plan: LCSW to call client in next 4 weeks to talk with client about the psychosocial needs  of client at that time  [name].Bradan Congrove MSW, LCSW Licensed Clinical Social Worker [facility] Care Management [phone]

## 2019-08-04 ENCOUNTER — Ambulatory Visit (HOSPITAL_COMMUNITY)
Admission: RE | Admit: 2019-08-04 | Discharge: 2019-08-04 | Disposition: A | Discharge status: Home / Self Care | Payer: Medicare HMO | Source: Ambulatory Visit | Attending: Cardiology | Admitting: Cardiology

## 2019-08-04 ENCOUNTER — Other Ambulatory Visit: Payer: Self-pay

## 2019-08-04 DIAGNOSIS — I10 Essential (primary) hypertension: Secondary | ICD-10-CM | POA: Diagnosis not present

## 2019-08-04 DIAGNOSIS — I447 Left bundle-branch block, unspecified: Secondary | ICD-10-CM | POA: Diagnosis not present

## 2019-08-04 NOTE — Progress Notes (Signed)
*  PRELIMINARY RESULTS* Echocardiogram 2D Echocardiogram has been performed.  Stacey Drain 08/04/2019, 1:38 PM

## 2019-08-11 ENCOUNTER — Other Ambulatory Visit: Payer: Self-pay | Admitting: *Deleted

## 2019-08-11 MED ORDER — TRUE METRIX BLOOD GLUCOSE TEST VI STRP: ORAL_STRIP | 3 refills | Status: DC

## 2019-08-11 MED ORDER — BD SWAB SINGLE USE REGULAR PADS: MEDICATED_PAD | 3 refills | Status: DC

## 2019-08-11 MED ORDER — TRUEDRAW LANCING DEVICE MISC: 2 refills | Status: AC

## 2019-08-11 MED ORDER — TRUE METRIX LEVEL 1 LOW VI SOLN: 1 refills | Status: AC

## 2019-08-11 MED ORDER — TRUEPLUS LANCETS 30G MISC: 3 refills | Status: DC

## 2019-08-17 ENCOUNTER — Encounter: Payer: Self-pay | Admitting: *Deleted

## 2019-08-20 ENCOUNTER — Ambulatory Visit: Payer: Medicare HMO | Admitting: Licensed Clinical Social Worker

## 2019-08-20 DIAGNOSIS — I2510 Atherosclerotic heart disease of native coronary artery without angina pectoris: Secondary | ICD-10-CM

## 2019-08-20 DIAGNOSIS — E1169 Type 2 diabetes mellitus with other specified complication: Secondary | ICD-10-CM

## 2019-08-20 DIAGNOSIS — K2100 Gastro-esophageal reflux disease with esophagitis, without bleeding: Secondary | ICD-10-CM

## 2019-08-20 DIAGNOSIS — E1122 Type 2 diabetes mellitus with diabetic chronic kidney disease: Secondary | ICD-10-CM

## 2019-08-20 DIAGNOSIS — E1159 Type 2 diabetes mellitus with other circulatory complications: Secondary | ICD-10-CM

## 2019-08-20 DIAGNOSIS — N183 Chronic kidney disease, stage 3 unspecified: Secondary | ICD-10-CM

## 2019-08-20 DIAGNOSIS — E785 Hyperlipidemia, unspecified: Secondary | ICD-10-CM

## 2019-08-20 NOTE — Chronic Care Management (AMB) (Signed)
Chronic Care Management    Clinical Social Work Follow Up Note  08/20/2019 Name: Tonya Carpenter MRN: [id number] DOB: 04-Feb-1946  Tonya Carpenter is a 74 y.o. year old female who is a primary care patient of [name], Fransisca Kaufmann, MD. The CCM team was consulted for assistance with Tonya Carpenter .   Review of patient status, including review of consultants reports, other relevant assessments, and collaboration with appropriate care team members and the patient's provider was performed as part of comprehensive patient evaluation and provision of chronic care management services.    SDOH (Social Determinants of Health) assessments performed: No ; risk for social isolation; risk for financial strain; risk for stress; risk for transport needs    Office Visit from 12/03/2018 in [place]  PHQ-9 Total Score  6      GAD 7 : Generalized Anxiety Score 10/07/2018  Nervous, Anxious, on Edge 1  Control/stop worrying 1  Worry too much - different things 0  Trouble relaxing 1  Restless 0  Easily annoyed or irritable 0  Afraid - awful might happen 0  Total GAD 7 Score 3  Anxiety Difficulty Somewhat difficult    Outpatient Encounter Medications as of 08/20/2019  Medication Sig  . acetaminophen (TYLENOL) 500 MG tablet Take 1,000 mg by mouth every 6 (six) hours as needed for mild pain or headache.   . Alcohol Swabs (B-D SINGLE USE SWABS REGULAR) PADS USE TO CHECK BLOOD SUGAR TWICE DAILY AND AS NEEDED Dx E11.9  . aspirin EC 81 MG tablet Take 81 mg by mouth daily.  Marland Kitchen atorvastatin (LIPITOR) 20 MG tablet Take 1 tablet (20 mg total) by mouth daily.  . Blood Glucose Calibration (TRUE METRIX LEVEL 1) Low SOLN Use as needed with BS machine  . Blood Glucose Monitoring Suppl (ACCU-CHEK AVIVA PLUS) w/Device KIT Test BS BID Dx E11.9  . diclofenac Sodium (VOLTAREN) 1 % GEL Apply 2 g topically 4 (four) times daily as needed for pain.  Marland Kitchen glucose blood (TRUE METRIX BLOOD GLUCOSE TEST) test strip  Teset BS BID Dx E11.9  . hydrALAZINE (APRESOLINE) 10 MG tablet Take 1 tablet (10 mg total) by mouth 2 (two) times daily.  Marland Kitchen JANUMET XR 50-500 MG TB24 Take 1 tablet by mouth 2 (two) times daily.  Elmore Guise Devices (TRUEDRAW LANCING DEVICE) MISC Teset BS BID Dx E11.9  . losartan-hydrochlorothiazide (HYZAAR) 50-12.5 MG tablet Take 1 tablet by mouth daily.  . metoprolol succinate (TOPROL-XL) 50 MG 24 hr tablet TAKE 1 TABLET (50 MG TOTAL) BY MOUTH DAILY. TAKE WITH OR IMMEDIATELY FOLLOWING A MEAL.  . nitroGLYCERIN (NITROSTAT) 0.4 MG SL tablet DISSOLVE 1 TABLET UNDER THE TONGUE EVERY 5 MINUTES AS NEEDED FOR CHEST PAIN  . omeprazole (PRILOSEC) 40 MG capsule Take 1 capsule (40 mg total) by mouth daily.  . polyethylene glycol (MIRALAX / GLYCOLAX) 17 g packet Take 17 g by mouth daily as needed.  . TRUEplus Lancets 30G MISC Teset BS BID Dx E11.9   No facility-administered encounter medications on file as of 08/20/2019.    LCSW called client home number and cell number several times today but LCSW was not able to speak via phone with client today. LCSW did leave phone message asking Tonya Carpenter to return call to LCSW at [phone]  Follow Up Plan: LCSW to call client in next 4 weeks to talk with client about the psychosocial needs of client at that time . Tonya Carpenter.Tonya Carpenter MSW, LCSW Licensed Holiday representative [facility] Care Management [phone]

## 2019-08-20 NOTE — Patient Instructions (Addendum)
Licensed Clinical Social Worker Visit Information  Materials Provided: No  08/20/2019   Name: PALOMA GRANGE MRN: [id number] DOB: Nov 18, 1945   Garry Heater Hor is a 74 y.o. year old female who is a primary care patient of [name], Elige Radon, MD. The CCM team was consulted for assistance with [employer] .   Review of patient status, including review of consultants reports, other relevant assessments, and collaboration with appropriate care team members and the patient's provider was performed as part of comprehensive patient evaluation and provision of chronic care management services.   SDOH (Social Determinants of Health) assessments performed: No ; risk for social isolation; risk for financial strain; risk for stress; risk for transport needs   LCSW called client home number and cell number several times today but LCSW was not able to speak via phone with client today. LCSW did leave phone message asking Kemia to return call to LCSW at [phone]  Follow Up Plan: LCSW to call client in next 4 weeks to talk with client about the psychosocial needs of client at that time  LCSW was not able to speak via phone with client today; thus the patient was not able to verbalize understanding of instructions provided today and was not able to accept or decline a print copy of patient instruction materials.   [name].Yamira Papa MSW, LCSW Licensed Clinical Social Worker [facility] [phone]

## 2019-08-27 DIAGNOSIS — H2513 Age-related nuclear cataract, bilateral: Secondary | ICD-10-CM | POA: Diagnosis not present

## 2019-08-27 DIAGNOSIS — H40033 Anatomical narrow angle, bilateral: Secondary | ICD-10-CM | POA: Diagnosis not present

## 2019-09-06 ENCOUNTER — Other Ambulatory Visit: Payer: Self-pay | Admitting: Family Medicine

## 2019-09-13 ENCOUNTER — Ambulatory Visit: Payer: Medicare HMO | Admitting: Family Medicine

## 2019-09-22 ENCOUNTER — Ambulatory Visit: Payer: Medicare HMO | Admitting: Family Medicine

## 2019-09-24 ENCOUNTER — Other Ambulatory Visit: Payer: Self-pay

## 2019-09-24 ENCOUNTER — Ambulatory Visit (INDEPENDENT_AMBULATORY_CARE_PROVIDER_SITE_OTHER): Payer: Medicare HMO | Admitting: Licensed Clinical Social Worker

## 2019-09-24 ENCOUNTER — Emergency Department (HOSPITAL_COMMUNITY)
Admission: EM | Admit: 2019-09-24 | Discharge: 2019-09-25 | Disposition: A | Discharge status: Home / Self Care | Payer: Medicare HMO | Attending: Emergency Medicine | Admitting: Emergency Medicine

## 2019-09-24 ENCOUNTER — Emergency Department (HOSPITAL_COMMUNITY): Payer: Medicare HMO

## 2019-09-24 ENCOUNTER — Encounter (HOSPITAL_COMMUNITY): Payer: Self-pay

## 2019-09-24 DIAGNOSIS — T148XXA Other injury of unspecified body region, initial encounter: Secondary | ICD-10-CM

## 2019-09-24 DIAGNOSIS — E785 Hyperlipidemia, unspecified: Secondary | ICD-10-CM | POA: Diagnosis not present

## 2019-09-24 DIAGNOSIS — S80212A Abrasion, left knee, initial encounter: Secondary | ICD-10-CM | POA: Diagnosis not present

## 2019-09-24 DIAGNOSIS — I10 Essential (primary) hypertension: Secondary | ICD-10-CM | POA: Insufficient documentation

## 2019-09-24 DIAGNOSIS — N183 Chronic kidney disease, stage 3 unspecified: Secondary | ICD-10-CM

## 2019-09-24 DIAGNOSIS — E1122 Type 2 diabetes mellitus with diabetic chronic kidney disease: Secondary | ICD-10-CM

## 2019-09-24 DIAGNOSIS — K2100 Gastro-esophageal reflux disease with esophagitis, without bleeding: Secondary | ICD-10-CM

## 2019-09-24 DIAGNOSIS — Y929 Unspecified place or not applicable: Secondary | ICD-10-CM | POA: Insufficient documentation

## 2019-09-24 DIAGNOSIS — Y999 Unspecified external cause status: Secondary | ICD-10-CM | POA: Insufficient documentation

## 2019-09-24 DIAGNOSIS — I2510 Atherosclerotic heart disease of native coronary artery without angina pectoris: Secondary | ICD-10-CM | POA: Diagnosis not present

## 2019-09-24 DIAGNOSIS — E1159 Type 2 diabetes mellitus with other circulatory complications: Secondary | ICD-10-CM | POA: Diagnosis not present

## 2019-09-24 DIAGNOSIS — W010XXA Fall on same level from slipping, tripping and stumbling without subsequent striking against object, initial encounter: Secondary | ICD-10-CM | POA: Diagnosis not present

## 2019-09-24 DIAGNOSIS — E119 Type 2 diabetes mellitus without complications: Secondary | ICD-10-CM | POA: Insufficient documentation

## 2019-09-24 DIAGNOSIS — Z8673 Personal history of transient ischemic attack (TIA), and cerebral infarction without residual deficits: Secondary | ICD-10-CM | POA: Insufficient documentation

## 2019-09-24 DIAGNOSIS — J45909 Unspecified asthma, uncomplicated: Secondary | ICD-10-CM | POA: Diagnosis not present

## 2019-09-24 DIAGNOSIS — S62616A Displaced fracture of proximal phalanx of right little finger, initial encounter for closed fracture: Secondary | ICD-10-CM | POA: Diagnosis not present

## 2019-09-24 DIAGNOSIS — Y939 Activity, unspecified: Secondary | ICD-10-CM | POA: Insufficient documentation

## 2019-09-24 DIAGNOSIS — S6991XA Unspecified injury of right wrist, hand and finger(s), initial encounter: Secondary | ICD-10-CM | POA: Diagnosis not present

## 2019-09-24 DIAGNOSIS — S0091XA Abrasion of unspecified part of head, initial encounter: Secondary | ICD-10-CM | POA: Insufficient documentation

## 2019-09-24 DIAGNOSIS — W19XXXA Unspecified fall, initial encounter: Secondary | ICD-10-CM | POA: Diagnosis not present

## 2019-09-24 DIAGNOSIS — S0990XA Unspecified injury of head, initial encounter: Secondary | ICD-10-CM | POA: Diagnosis not present

## 2019-09-24 DIAGNOSIS — Z23 Encounter for immunization: Secondary | ICD-10-CM | POA: Insufficient documentation

## 2019-09-24 DIAGNOSIS — E1169 Type 2 diabetes mellitus with other specified complication: Secondary | ICD-10-CM | POA: Diagnosis not present

## 2019-09-24 DIAGNOSIS — R58 Hemorrhage, not elsewhere classified: Secondary | ICD-10-CM | POA: Diagnosis not present

## 2019-09-24 DIAGNOSIS — S62619A Displaced fracture of proximal phalanx of unspecified finger, initial encounter for closed fracture: Secondary | ICD-10-CM

## 2019-09-24 DIAGNOSIS — S62615A Displaced fracture of proximal phalanx of left ring finger, initial encounter for closed fracture: Secondary | ICD-10-CM | POA: Insufficient documentation

## 2019-09-24 DIAGNOSIS — R609 Edema, unspecified: Secondary | ICD-10-CM | POA: Diagnosis not present

## 2019-09-24 DIAGNOSIS — R52 Pain, unspecified: Secondary | ICD-10-CM | POA: Diagnosis not present

## 2019-09-24 MED ORDER — HYDROCODONE-ACETAMINOPHEN 5-325 MG PO TABS
1.0000 | ORAL_TABLET | Freq: Once | ORAL | Status: AC
  Administered 2019-09-25: 1 via ORAL
  Filled 2019-09-24: qty 1

## 2019-09-24 MED ORDER — TETANUS-DIPHTH-ACELL PERTUSSIS 5-2.5-18.5 LF-MCG/0.5 IM SUSP
0.5000 mL | Freq: Once | INTRAMUSCULAR | Status: AC
  Administered 2019-09-25: 0.5 mL via INTRAMUSCULAR
  Filled 2019-09-24: qty 0.5

## 2019-09-24 MED ORDER — IBUPROFEN 400 MG PO TABS
400.0000 mg | ORAL_TABLET | Freq: Once | ORAL | Status: AC
  Administered 2019-09-25: 400 mg via ORAL
  Filled 2019-09-24: qty 1

## 2019-09-24 NOTE — ED Triage Notes (Signed)
Pt to er via ems, per ems pt tripped coming out of her apartment, pt states that she tripped and fell pt has swelling and abrasions to L face, c/o L hand and knee pain.

## 2019-09-24 NOTE — Patient Instructions (Addendum)
Licensed Clinical Social Worker Visit Information  Goals we discussed today:    .  Client wants to talk with LCSW about mannaging streess issues faced (pt-stated)        Current Barriers:   Financial challenges in client with Chronic Diagnoses of GERD, CAD, TYpe 2 DM, HLD, HTN  Transport challenge  Clinical Social Work Clinical Goal(s):   Client to talk with LCSW in next 30 days about client managing stress issues  Interventions:  Talked with client about support with RNCM for nursing needs  Talked about Food Stamps benefit  Talked about [facility] support  Talked with client about pain issues of client  Talked with client aboutclient completion of ADLs  Talkedwith client about home health needs of client  Talked with client about transport needs of client Talked with [name] about ambulation needs of client Talked with [name] about her upcoming medical appointments Talked with [name] about [employer] support and putting her name on [employer] waiting list Talked with Tamirah about sleeping challenges  Patient Self Care Activities:   Takes medications as prescribed  Attends scheduled medical appointments  Plan: Client to attend medical appointments LCSW to call client in next 4 weeks to discuss psychosocial needs of client Client to talk with [facility] about resources   Initial goal documentation     Follow Up Plan: LCSW to call client in next 4 weeks to talk with client about the psychosocial needs of client at that time.  Materials Provided: No  The patient verbalized understanding of instructions provided today and declined a print copy of patient instruction materials.   [name].Tenya Araque MSW, LCSW Licensed Clinical Social Worker [facility] [phone]

## 2019-09-24 NOTE — Chronic Care Management (AMB) (Signed)
Chronic Care Management    Clinical Social Work Follow Up Note  09/24/2019 Name: DOMIQUE REARDON MRN: [id number] DOB: Jul 29, 1945  Kristine Royal Fidalgo is a 74 y.o. year old female who is a primary care patient of [name], Fransisca Kaufmann, MD. The CCM team was consulted for assistance with [name] .   Review of patient status, including review of consultants reports, other relevant assessments, and collaboration with appropriate care team members and the patient's provider was performed as part of comprehensive patient evaluation and provision of chronic care management services.    SDOH (Social Determinants of Health) assessments performed: No;risk for financial strain; risk for depression; risk for stress; risk for transport needs    Office Visit from 12/03/2018 in [place]  PHQ-9 Total Score 6     GAD 7 : Generalized Anxiety Score 10/07/2018  Nervous, Anxious, on Edge 1  Control/stop worrying 1  Worry too much - different things 0  Trouble relaxing 1  Restless 0  Easily annoyed or irritable 0  Afraid - awful might happen 0  Total GAD 7 Score 3  Anxiety Difficulty Somewhat difficult    Outpatient Encounter Medications as of 09/24/2019  Medication Sig  . acetaminophen (TYLENOL) 500 MG tablet Take 1,000 mg by mouth every 6 (six) hours as needed for mild pain or headache.   . Alcohol Swabs (B-D SINGLE USE SWABS REGULAR) PADS USE TO CHECK BLOOD SUGAR TWICE DAILY AND AS NEEDED Dx E11.9  . ASPIRIN LOW DOSE 81 MG EC tablet TAKE 1 TABLET EVERY DAY  . atorvastatin (LIPITOR) 20 MG tablet Take 1 tablet (20 mg total) by mouth daily.  . Blood Glucose Calibration (TRUE METRIX LEVEL 1) Low SOLN Use as needed with BS machine  . Blood Glucose Monitoring Suppl (ACCU-CHEK AVIVA PLUS) w/Device KIT Test BS BID Dx E11.9  . diclofenac Sodium (VOLTAREN) 1 % GEL Apply 2 g topically 4 (four) times daily as needed for pain.  Marland Kitchen glucose blood (TRUE METRIX BLOOD GLUCOSE TEST) test strip Teset  BS BID Dx E11.9  . hydrALAZINE (APRESOLINE) 10 MG tablet Take 1 tablet (10 mg total) by mouth 2 (two) times daily.  Marland Kitchen JANUMET XR 50-500 MG TB24 Take 1 tablet by mouth 2 (two) times daily.  Elmore Guise Devices (TRUEDRAW LANCING DEVICE) MISC Teset BS BID Dx E11.9  . losartan-hydrochlorothiazide (HYZAAR) 50-12.5 MG tablet Take 1 tablet by mouth daily.  . metoprolol succinate (TOPROL-XL) 50 MG 24 hr tablet TAKE 1 TABLET (50 MG TOTAL) BY MOUTH DAILY. TAKE WITH OR IMMEDIATELY FOLLOWING A MEAL.  . nitroGLYCERIN (NITROSTAT) 0.4 MG SL tablet DISSOLVE 1 TABLET UNDER THE TONGUE EVERY 5 MINUTES AS NEEDED FOR CHEST PAIN  . omeprazole (PRILOSEC) 40 MG capsule TAKE 1 CAPSULE EVERY DAY  . polyethylene glycol (MIRALAX / GLYCOLAX) 17 g packet Take 17 g by mouth daily as needed.  . TRUEplus Lancets 30G MISC Teset BS BID Dx E11.9   No facility-administered encounter medications on file as of 09/24/2019.    Goals    .  Client wants to talk with LCSW about mannaging streess issues faced (pt-stated)      Current Barriers:  . Financial challenges in client with Chronic Diagnoses of GERD, CAD, TYpe 2 DM, HLD, HTN . Transport challenge  Clinical Social Work Clinical Goal(s):  . Client to talk with LCSW in next 30 days about client managing stress issues  Interventions: . Talked with client about support with [facility] for nursing needs . Talked  about Food Stamps benefit . Talked about [place] support  Talked with client about pain issues of client  Talked with client aboutclient completion of ADLs  Talkedwith client about home health needs of client  Talked with client about transport needs of client Talked with [name] about ambulation needs of client Talked with [name] about her upcoming medical appointments Talked with [name] about [employer] support and putting her name on [employer]als [employer] Whe[employer] waiting list Talked with Aianna about sleeping challenges  Patient Self Care Activities:  . Takes  medications as prescribed . Attends scheduled medical appointments  Plan: Client to attend medical appointments LCSW to call client in next 4 weeks to discuss psychosocial needs of client Client to talk with [facility] about resources   Initial goal documentation     Follow Up Plan: LCSW to call client in next 4 weeks to talk with client about the psychosocial needs of client at that time . [name].Colie Josten MSW, LCSW Licensed Clinical Social Worker [facility] [phone]

## 2019-09-25 ENCOUNTER — Emergency Department (HOSPITAL_COMMUNITY): Payer: Medicare HMO

## 2019-09-25 DIAGNOSIS — S6991XA Unspecified injury of right wrist, hand and finger(s), initial encounter: Secondary | ICD-10-CM | POA: Diagnosis not present

## 2019-09-25 DIAGNOSIS — S0990XA Unspecified injury of head, initial encounter: Secondary | ICD-10-CM | POA: Diagnosis not present

## 2019-09-25 MED ORDER — BACITRACIN ZINC 500 UNIT/GM EX OINT
TOPICAL_OINTMENT | Freq: Every day | CUTANEOUS | Status: DC
  Filled 2019-09-25: qty 0.9

## 2019-09-25 NOTE — ED Notes (Signed)
Patient transported to CT 

## 2019-09-25 NOTE — Discharge Instructions (Signed)
Call one of the orthopedic doctors that I gave you the number for to have them recheck your broken fingers. Keep the splint on until you are seen.

## 2019-09-25 NOTE — ED Provider Notes (Signed)
[facility] EMERGENCY DEPARTMENT Provider Note   CSN: [id number] Arrival date & time: 09/24/19  1658     History Chief Complaint  Patient presents with  . Fall    Tonya SAKSA is a 74 y.o. female.  Patient presents to the emergency department for evaluation of injuries suffered from a fall.  Patient reports that she was coming out of her apartment and tripped, causing the fall.  No loss of consciousness.  Patient reports that she did hit her head on the ground and suffered an abrasion to the left side of her face.  She is also complaining of pain in the both hands and right wrist.  She does have an abrasion on her left knee but is not experiencing any leg pain.  There was no loss of consciousness.  Patient denies neck and back pain.        Past Medical History:  Diagnosis Date  . Anxiety   . Arthritis    "hands" (01/14/2013)  . Asthma   . Cataract   . Coronary artery disease    Non obstructive CAD 2014 cath.   . Exertional shortness of breath   . Hyperlipidemia   . Hypertension   . Stroke [facility])    "light stroke long years ago; didn't affect me permanently" (01/14/2013)  . Type 2 diabetes mellitus [facility])     Patient Active Problem List   Diagnosis Date Noted  . LBBB (left bundle branch block) 07/20/2019  . Educated about COVID-19 virus infection 07/20/2019  . Dizziness 04/22/2018  . Shoulder impingement syndrome, right 12/02/2017  . GERD (gastroesophageal reflux disease) 10/20/2013  . Frequency 02/18/2013  . Coronary atherosclerosis of native coronary artery 01/14/2013  . CAD, multiple vessel 07/10/2012  . CARDIAC MURMUR 07/03/2009  . Type 2 diabetes mellitus ([place]) 09/12/2008  . Hyperlipidemia associated with type 2 diabetes mellitus ([place]) 09/12/2008  . Hypertension associated with diabetes ([place]) 09/12/2008    Past Surgical History:  Procedure Laterality Date  . APPENDECTOMY    . CARDIAC CATHETERIZATION  2008   no angiographically significant CAD  .  CARDIOVASCULAR STRESS TEST  12/2009   no evidence for stress-induced reversibility or ischemia.  She had a fixed anterior septal wall defect, possibly related to breast attenuation and her EF was 54%.  Marland Kitchen CATARACT EXTRACTION W/PHACO Right 10/25/2014   Procedure: CATARACT EXTRACTION PHACO AND INTRAOCULAR LENS PLACEMENT; CDE:  17.28;  Surgeon: Rutherford Guys, MD;  Location: [facility] ORS;  Service: Ophthalmology;  Laterality: Right;  . CATARACT EXTRACTION W/PHACO Left 11/08/2014   Procedure: CATARACT EXTRACTION PHACO AND INTRAOCULAR LENS PLACEMENT (IOC);  Surgeon: Rutherford Guys, MD;  Location: [facility] ORS;  Service: Ophthalmology;  Laterality: Left;  CDE: 5.94  . CHOLECYSTECTOMY    . COLONOSCOPY N/A 03/31/2019   Procedure: COLONOSCOPY;  Surgeon: Danie Binder, MD;  Location: [facility] ENDO SUITE;  Service: Endoscopy;  Laterality: N/A;  10:30  . DILATION AND CURETTAGE OF UTERUS    . LEFT HEART CATHETERIZATION WITH CORONARY ANGIOGRAM N/A 01/14/2013   Procedure: LEFT HEART CATHETERIZATION WITH CORONARY ANGIOGRAM;  Surgeon: Burnell Blanks, MD;  Location: [facility] CATH LAB;  Service: Cardiovascular;  Laterality: N/A;  . TONSILLECTOMY    . TUBAL LIGATION    . VAGINAL HYSTERECTOMY       OB History    Gravida      Para      Term      Preterm      AB      Living  2     SAB      TAB      Ectopic      Multiple      Live Births              Family History  Problem Relation Age of Onset  . Hypertension Mother   . Hypertension Father   . Cancer Sister        breast  . Heart Problems Son   . Seizures Son     Social History   Tobacco Use  . Smoking status: Never Smoker  . Smokeless tobacco: Never Used  Substance Use Topics  . Alcohol use: No  . Drug use: No    Home Medications Prior to Admission medications   Medication Sig Start Date End Date Taking? Authorizing Provider  acetaminophen (TYLENOL) 500 MG tablet Take 1,000 mg by mouth every 6 (six) hours as needed for mild pain or headache.      [provider]  Alcohol Swabs (B-D SINGLE USE SWABS REGULAR) PADS USE TO CHECK BLOOD SUGAR TWICE DAILY AND AS NEEDED Dx E11.9 08/11/19   [name], Fransisca Kaufmann, MD  ASPIRIN LOW DOSE 81 MG EC tablet TAKE 1 TABLET EVERY DAY 09/06/19   Hawks, Alyse Low A, FNP  atorvastatin (LIPITOR) 20 MG tablet Take 1 tablet (20 mg total) by mouth daily. 07/07/19   [name], Fransisca Kaufmann, MD  Blood Glucose Calibration (TRUE METRIX LEVEL 1) Low SOLN Use as needed with BS machine 08/11/19   [name], Fransisca Kaufmann, MD  Blood Glucose Monitoring Suppl (ACCU-CHEK AVIVA PLUS) w/Device KIT Test BS BID Dx E11.9 04/01/19   [name], Fransisca Kaufmann, MD  diclofenac Sodium (VOLTAREN) 1 % GEL Apply 2 g topically 4 (four) times daily as needed for pain. 01/22/19   [provider]  glucose blood (TRUE METRIX BLOOD GLUCOSE TEST) test strip Teset BS BID Dx E11.9 08/11/19   [name], Fransisca Kaufmann, MD  hydrALAZINE (APRESOLINE) 10 MG tablet Take 1 tablet (10 mg total) by mouth 2 (two) times daily. 12/03/18   [name], Fransisca Kaufmann, MD  JANUMET XR 50-500 MG TB24 Take 1 tablet by mouth 2 (two) times daily. 03/08/19   [name], Fransisca Kaufmann, MD  Lancet Devices (TRUEDRAW LANCING DEVICE) MISC Teset BS BID Dx E11.9 08/11/19   [name], Fransisca Kaufmann, MD  losartan-hydrochlorothiazide (HYZAAR) 50-12.5 MG tablet Take 1 tablet by mouth daily. 12/03/18   [name], Fransisca Kaufmann, MD  metoprolol succinate (TOPROL-XL) 50 MG 24 hr tablet TAKE 1 TABLET (50 MG TOTAL) BY MOUTH DAILY. TAKE WITH OR IMMEDIATELY FOLLOWING A MEAL. 04/22/19 07/21/19  Minus Breeding, MD  nitroGLYCERIN (NITROSTAT) 0.4 MG SL tablet DISSOLVE 1 TABLET UNDER THE TONGUE EVERY 5 MINUTES AS NEEDED FOR CHEST PAIN 05/19/19   [name], Fransisca Kaufmann, MD  omeprazole (PRILOSEC) 40 MG capsule TAKE 1 CAPSULE EVERY DAY 09/06/19   [name], Fransisca Kaufmann, MD  polyethylene glycol (MIRALAX / GLYCOLAX) 17 g packet Take 17 g by mouth daily as needed.    [provider]  TRUEplus Lancets 30G MISC Teset BS BID Dx E11.9 08/11/19    [name], Fransisca Kaufmann, MD    Allergies    Trilyte [peg [id number] bicarb-nacl]  Review of Systems   Review of Systems  Musculoskeletal: Positive for arthralgias.  Skin:       Abrasions  All other systems reviewed and are negative.   Physical Exam Updated Vital Signs BP (!) 168/56 (BP Location: Right Arm)   Pulse 69   Temp 98.6 F (  37 C) (Oral)   Resp 18   Ht 5' 3"  (1.6 m)   Wt 70.8 kg   SpO2 99%   BMI 27.63 kg/m   Physical Exam Vitals and nursing note reviewed.  Constitutional:      General: She is not in acute distress.    Appearance: Normal appearance. She is well-developed.  HENT:     Head: Normocephalic. Abrasion (Left forehead/face) present.      Right Ear: Hearing normal.     Left Ear: Hearing normal.     Nose: Nose normal.  Eyes:     Conjunctiva/sclera: Conjunctivae normal.     Pupils: Pupils are equal, round, and reactive to light.  Cardiovascular:     Rate and Rhythm: Regular rhythm.     Heart sounds: S1 normal and S2 normal. No murmur heard.  No friction rub. No gallop.   Pulmonary:     Effort: Pulmonary effort is normal. No respiratory distress.     Breath sounds: Normal breath sounds.  Chest:     Chest wall: No tenderness.  Abdominal:     General: Bowel sounds are normal.     Palpations: Abdomen is soft.     Tenderness: There is no abdominal tenderness. There is no guarding or rebound. Negative signs include Murphy's sign and McBurney's sign.     Hernia: No hernia is present.  Musculoskeletal:     Right wrist: Normal.     Left wrist: Tenderness present. No swelling or deformity. Decreased range of motion.     Right hand: Tenderness present. No swelling, deformity or lacerations.     Left hand: Deformity (Fourth finger) and tenderness present. No swelling or lacerations.     Cervical back: Normal range of motion and neck supple. No pain with movement, spinous process tenderness or muscular tenderness. Normal range of motion.     Left knee: No  swelling, deformity, erythema or bony tenderness. Normal range of motion.       Legs:  Skin:    General: Skin is warm and dry.     Findings: Abrasion present. No rash.  Neurological:     Mental Status: She is alert and oriented to person, place, and time.     GCS: GCS eye subscore is 4. GCS verbal subscore is 5. GCS motor subscore is 6.     Cranial Nerves: No cranial nerve deficit.     Sensory: No sensory deficit.     Coordination: Coordination normal.  Psychiatric:        Speech: Speech normal.        Behavior: Behavior normal.        Thought Content: Thought content normal.     ED Results / Procedures / Treatments   Labs (all labs ordered are listed, but only abnormal results are displayed) Labs Reviewed - No data to display  EKG Tonya Carpenter  Radiology DG Wrist Complete Right  Result Date: 09/25/2019 CLINICAL DATA:  Fall EXAM: RIGHT WRIST - COMPLETE 3+ VIEW COMPARISON:  Tonya Carpenter. FINDINGS: There is no evidence of fracture or dislocation. There is no evidence of arthropathy or other focal bone abnormality. Soft tissues are unremarkable. IMPRESSION: Negative. Electronically Signed   By: Ulyses Jarred M.D.   On: 09/25/2019 01:02   CT HEAD WO CONTRAST  Result Date: 09/25/2019 CLINICAL DATA:  Headache, posttraumatic, fall with abrasions EXAM: CT HEAD WITHOUT CONTRAST TECHNIQUE: Contiguous axial images were obtained from the base of the skull through the vertex without intravenous contrast. COMPARISON:  CT  03/13/2010 FINDINGS: Brain: Stable region of encephalomalacia in the left parietal lobe compatible with sequela of prior infarct. No CT evident areas of acute infarct. No acute intracranial hemorrhage or extra-axial collections. Markedly low attenuation of the extra-axial CSF spaces along the bilateral frontal convexities likely related to beam hardening. Similar appearance to prior. No mass effect or midline shift. Patchy areas of white matter hypoattenuation are most compatible with chronic  microvascular angiopathy. Vascular: Atherosclerotic calcification of the carotid siphons and intradural vertebral arteries. No hyperdense vessel. Skull: Left frontal, left periorbital and malar soft tissue swelling/contusive changes. No subjacent calvarial fracture or visible facial bone fracture within the included levels of imaging. Extensive hyperostosis frontalis interna is similar to prior and is a benign incidental finding. No worrisome osseous lesions. Sinuses/Orbits: Paranasal sinuses and mastoid air cells are predominantly clear. Left periorbital contusive changes with palpebral thickening. In swelling extends to the medial orbit. No retro septal gas, stranding or hemorrhage. Globes appear normal and symmetric with prior bilateral lens extraction. Other: Tonya Carpenter IMPRESSION: 1. Left frontal, periorbital and malar soft tissue swelling/contusive changes. 2. No subjacent calvarial fracture or visible facial bone fracture within the included levels of imaging. 3. No acute intracranial abnormality. 4. Stable region of encephalomalacia in the left parietal lobe compatible with sequela of prior infarct. 5. Stable chronic microvascular angiopathy and intracranial atherosclerosis. Electronically Signed   By: Lovena Le M.D.   On: 09/25/2019 02:57   DG Hand Complete Left  Result Date: 09/25/2019 CLINICAL DATA:  Fall EXAM: LEFT HAND - COMPLETE 3+ VIEW COMPARISON:  Tonya Carpenter. FINDINGS: The bones are osteopenic. There are transverse fractures of the proximal metaphyses of the fourth and fifth proximal phalanges. There is minimal displacement with mild dorsal angulation. IMPRESSION: Transverse fractures of the fourth and fifth proximal phalanges. Electronically Signed   By: Ulyses Jarred M.D.   On: 09/25/2019 00:59   DG Hand Complete Right  Result Date: 09/25/2019 CLINICAL DATA:  Fall EXAM: RIGHT HAND - COMPLETE 3+ VIEW COMPARISON:  Tonya Carpenter. FINDINGS: The bones are osteopenic. There is no acute fracture or dislocation of  the right hand. Joint spaces are preserved. IMPRESSION: No acute fracture or dislocation of the right hand. Osteopenia. Electronically Signed   By: Ulyses Jarred M.D.   On: 09/25/2019 01:01    Procedures Procedures (including critical care time)  Medications Ordered in ED Medications  ibuprofen (ADVIL) tablet 400 mg (400 mg Oral Given 09/25/19 0025)  HYDROcodone-acetaminophen (NORCO/VICODIN) 5-325 MG per tablet 1 tablet (1 tablet Oral Given 09/25/19 0025)  Tdap (BOOSTRIX) injection 0.5 mL (0.5 mLs Intramuscular Given 09/25/19 0024)    ED Course  I have reviewed the triage vital signs and the nursing notes.  Pertinent labs & imaging results that were available during my care of the patient were reviewed by me and considered in my medical decision making (see chart for details).    MDM Rules/Calculators/A&P                          Patient presents to the emergency department for evaluation after a fall. Patient does have an abrasion on the left side of her face and forehead. CT head does not show any intracranial injury. She does not have any neck or back pain, these areas were clinically cleared. She does complain of bilateral hand and right wrist pain. Wrist x-ray is negative. X-ray of left hand does reveal fracture of fourth and fifth fingers. These will be splinted, follow-up  with orthopedics.  Final Clinical Impression(s) / ED Diagnoses Final diagnoses:  Abrasion  Closed displaced fracture of proximal phalanx of finger, unspecified finger, initial encounter    Rx / DC Orders ED Discharge Orders    Tonya Carpenter       Orpah Greek, MD 09/25/19 [phone]

## 2019-09-25 NOTE — ED Notes (Signed)
PT placed breakfast tray inside PT belonging bag stating PT would eat at food at home. Refused D/C vitals stating "My blood pressure is always fine".

## 2019-09-25 NOTE — ED Notes (Signed)
Splint applied

## 2019-09-25 NOTE — ED Notes (Signed)
Attempted to call son, no answer.  

## 2019-09-25 NOTE — ED Notes (Signed)
Please call patients son, Feliz Beam at [phone] at disposition, he will come to pick the patient up

## 2019-09-27 ENCOUNTER — Other Ambulatory Visit: Payer: Self-pay

## 2019-09-27 DIAGNOSIS — S62609A Fracture of unspecified phalanx of unspecified finger, initial encounter for closed fracture: Secondary | ICD-10-CM

## 2019-09-27 NOTE — Progress Notes (Unsigned)
Patient was seen in ED 09/24/2019 per x ray report patient has - Transverse fractures of the fourth and fifth proximal phalanges. Patient was placed in splint and advised to follow up with ortho.  Patient was seen in triage today at our office c/o the splint being too tight - patient has not appt with ortho and no referral was placed by ED. Mandy called and made appt at [facility] for tomorrow at 2 pm - patient will contact our office if she is not able to find transportation. Patient was advised how important it is to be seen by ortho as soon as possible and patient expressed understanding. I (Kross Swallows p.) loosened only the ace warp and rewrapped it snug by not as tight as it had gotten on the patient's arm - informed patient that the hard plaster portion of the splint could not be loosened or removed.

## 2019-10-01 ENCOUNTER — Encounter: Payer: Self-pay | Admitting: Family Medicine

## 2019-10-01 ENCOUNTER — Other Ambulatory Visit: Payer: Self-pay

## 2019-10-01 ENCOUNTER — Ambulatory Visit (INDEPENDENT_AMBULATORY_CARE_PROVIDER_SITE_OTHER): Payer: Medicare HMO | Admitting: Family Medicine

## 2019-10-01 VITALS — BP 131/63 | HR 67 | Temp 97.5°F | Ht 63.0 in | Wt 156.4 lb

## 2019-10-01 DIAGNOSIS — E785 Hyperlipidemia, unspecified: Secondary | ICD-10-CM

## 2019-10-01 DIAGNOSIS — E1169 Type 2 diabetes mellitus with other specified complication: Secondary | ICD-10-CM

## 2019-10-01 DIAGNOSIS — S62609D Fracture of unspecified phalanx of unspecified finger, subsequent encounter for fracture with routine healing: Secondary | ICD-10-CM | POA: Diagnosis not present

## 2019-10-01 DIAGNOSIS — I10 Essential (primary) hypertension: Secondary | ICD-10-CM | POA: Diagnosis not present

## 2019-10-01 DIAGNOSIS — E1159 Type 2 diabetes mellitus with other circulatory complications: Secondary | ICD-10-CM

## 2019-10-01 DIAGNOSIS — N183 Chronic kidney disease, stage 3 unspecified: Secondary | ICD-10-CM | POA: Diagnosis not present

## 2019-10-01 DIAGNOSIS — S62609S Fracture of unspecified phalanx of unspecified finger, sequela: Secondary | ICD-10-CM

## 2019-10-01 DIAGNOSIS — E1122 Type 2 diabetes mellitus with diabetic chronic kidney disease: Secondary | ICD-10-CM | POA: Diagnosis not present

## 2019-10-01 DIAGNOSIS — R296 Repeated falls: Secondary | ICD-10-CM | POA: Diagnosis not present

## 2019-10-01 LAB — BAYER DCA HB A1C WAIVED: HB A1C (BAYER DCA - WAIVED): 6.9 %

## 2019-10-01 NOTE — Progress Notes (Signed)
BP 131/63   Pulse 67   Temp (!) 97.5 F (36.4 C) (Temporal)   Ht 5' 3"  (1.6 m)   Wt 156 lb 6.4 oz (70.9 kg)   SpO2 99%   BMI 27.71 kg/m    Subjective:   Patient ID: Tonya Carpenter, female    DOB: Aug 19, 1945, 74 y.o.   MRN: [id number]  HPI: Tonya Carpenter is a 74 y.o. female presenting on 10/01/2019 for Medical Management of Chronic Issues (FALL WENT TO [facility]), Diabetes, and Chronic Kidney Disease   HPI Patient was in the ED on 09/24/2019 for a fall and then had another fall on 712, and she had fractures of the fourth and fifth proximal phalanges and was placed in a splint which was then changed.  She also ended up with a contusion on her head and her knee.  She has been more and more off balance and falling more easily.  Her strength is diminished.  Patient's ADLs are also diminished, she is unable to cook for herself and has been very reliant on only being able to do it when her son is home.  Type 2 diabetes mellitus Patient comes in today for recheck of his diabetes. Patient has been currently taking Janumet. Patient is currently on an ACE inhibitor/ARB. Patient has not seen an ophthalmologist this year. Patient denies any issues with their feet. The symptom started onset as an adult hypertension and hyperlipidemia and CKD and CAD ARE RELATED TO DM   Hypertension Patient is currently on hydralazine and losartan hydrochlorothiazide and metoprolol, and their blood pressure today is 131/63. Patient denies any lightheadedness or dizziness. Patient denies headaches, blurred vision, chest pains, shortness of breath, or weakness. Denies any side effects from medication and is content with current medication.   Hyperlipidemia and CAD Patient is coming in for recheck of his hyperlipidemia. The patient is currently taking atorvastatin. They deny any issues with myalgias or history of liver damage from it. They deny any focal numbness or weakness or chest pain.   Relevant past medical, surgical, family  and social history reviewed and updated as indicated. Interim medical history since our last visit reviewed. Allergies and medications reviewed and updated.  Review of Systems  Constitutional: Negative for chills and fever.  Eyes: Negative for visual disturbance.  Respiratory: Negative for chest tightness and shortness of breath.   Cardiovascular: Negative for chest pain and leg swelling.  Musculoskeletal: Negative for back pain and gait problem.  Skin: Positive for wound (Abrasion on left face over cheekbone with some swelling and bruising, abrasion on right knee.  Patient also has a splint on left arm from fourth and fifth phalanx fractures). Negative for rash.  Neurological: Negative for light-headedness and headaches.  Psychiatric/Behavioral: Negative for agitation and behavioral problems.  All other systems reviewed and are negative.   Per HPI unless specifically indicated above   Allergies as of 10/01/2019      Reactions   Trilyte [peg [id number] Bicarb-nacl]    Nausea/vomtiing      Medication List       Accurate as of October 01, 2019 11:48 AM. If you have any questions, ask your nurse or doctor.        Accu-Chek Aviva Plus w/Device Kit Test BS BID Dx E11.9   acetaminophen 500 MG tablet Commonly known as: TYLENOL Take 1,000 mg by mouth every 6 (six) hours as needed for mild pain or headache.   Aspirin Low Dose 81 MG EC tablet Generic drug:  aspirin TAKE 1 TABLET EVERY DAY   atorvastatin 20 MG tablet Commonly known as: LIPITOR Take 1 tablet (20 mg total) by mouth daily.   B-D SINGLE USE SWABS REGULAR Pads USE TO CHECK BLOOD SUGAR TWICE DAILY AND AS NEEDED Dx E11.9   diclofenac Sodium 1 % Gel Commonly known as: VOLTAREN Apply 2 g topically 4 (four) times daily as needed for pain.   hydrALAZINE 10 MG tablet Commonly known as: APRESOLINE Take 1 tablet (10 mg total) by mouth 2 (two) times daily.   Janumet XR 50-500 MG Tb24 Generic drug: SitaGLIPtin-MetFORMIN  HCl Take 1 tablet by mouth 2 (two) times daily.   losartan-hydrochlorothiazide 50-12.5 MG tablet Commonly known as: HYZAAR Take 1 tablet by mouth daily.   metoprolol succinate 50 MG 24 hr tablet Commonly known as: TOPROL-XL TAKE 1 TABLET (50 MG TOTAL) BY MOUTH DAILY. TAKE WITH OR IMMEDIATELY FOLLOWING A MEAL.   nitroGLYCERIN 0.4 MG SL tablet Commonly known as: NITROSTAT DISSOLVE 1 TABLET UNDER THE TONGUE EVERY 5 MINUTES AS NEEDED FOR CHEST PAIN   omeprazole 40 MG capsule Commonly known as: PRILOSEC TAKE 1 CAPSULE EVERY DAY   polyethylene glycol 17 g packet Commonly known as: MIRALAX / GLYCOLAX Take 17 g by mouth daily as needed.   True Metrix Blood Glucose Test test strip Generic drug: glucose blood Teset BS BID Dx E11.9   True Metrix Level 1 Low Soln Use as needed with BS machine   TRUEdraw Lancing Device Misc Teset BS BID Dx E11.9   TRUEplus Lancets 30G Misc Teset BS BID Dx E11.9        Objective:   BP 131/63   Pulse 67   Temp (!) 97.5 F (36.4 C) (Temporal)   Ht 5' 3"  (1.6 m)   Wt 156 lb 6.4 oz (70.9 kg)   SpO2 99%   BMI 27.71 kg/m   Wt Readings from Last 3 Encounters:  10/01/19 156 lb 6.4 oz (70.9 kg)  09/24/19 156 lb (70.8 kg)  07/21/19 158 lb (71.7 kg)    Physical Exam Vitals and nursing note reviewed.  Constitutional:      General: She is not in acute distress.    Appearance: She is well-developed. She is not diaphoretic.  Eyes:     Conjunctiva/sclera: Conjunctivae normal.  Cardiovascular:     Rate and Rhythm: Normal rate and regular rhythm.     Heart sounds: Normal heart sounds. No murmur heard.   Pulmonary:     Effort: Pulmonary effort is normal. No respiratory distress.     Breath sounds: Normal breath sounds. No wheezing.  Musculoskeletal:        General: No tenderness. Normal range of motion.     Comments: Splint on right arm going all the way over her fourth and fifth fingers of the right hand  Skin:    General: Skin is warm and  dry.     Findings: Abrasion (Abrasion on left cheek and right knee, both healing) present. No rash.  Neurological:     Mental Status: She is alert and oriented to person, place, and time.     Coordination: Coordination normal.     Gait: Gait abnormal (Very off-balance gait, nurse had to walk her out holding onto her so she would not fall).  Psychiatric:        Behavior: Behavior normal.       Assessment & Plan:   Problem List Items Addressed This Visit      Cardiovascular and Mediastinum  Hypertension associated with diabetes ([place])     Endocrine   Type 2 diabetes mellitus ([place]) - Primary   Relevant Orders   Bayer DCA Hb A1c Waived   Hyperlipidemia associated with type 2 diabetes mellitus ([place])    Other Visit Diagnoses    Recurrent falls       Relevant Orders   Ambulatory referral to [place]   Closed nondisplaced fracture of phalanx of finger, unspecified finger, unspecified phalanx, sequela       Relevant Orders   Ambulatory referral to [place]      Will do referral to home health for strengthening and therapy and try to get for an aide.  Patient does need help with ADLs, if we could not get her help at home then we may consider doing or discussing placement in a facility.  A1c 6.9 looks good Follow up plan: Return in about 3 months (around 01/01/2020), or if symptoms worsen or fail to improve, for Diabetes and hypertension and cholesterol.  Counseling provided for all of the vaccine components Orders Placed This Encounter  Procedures  . Bayer [facility] Hb A1c Cros[place], MD [facility] 10/01/2019, 11:48 AM

## 2019-10-05 DIAGNOSIS — S62617A Displaced fracture of proximal phalanx of left little finger, initial encounter for closed fracture: Secondary | ICD-10-CM | POA: Diagnosis not present

## 2019-10-05 DIAGNOSIS — S62615A Displaced fracture of proximal phalanx of left ring finger, initial encounter for closed fracture: Secondary | ICD-10-CM | POA: Diagnosis not present

## 2019-10-06 ENCOUNTER — Telehealth: Payer: Self-pay | Admitting: Family Medicine

## 2019-10-06 DIAGNOSIS — M79645 Pain in left finger(s): Secondary | ICD-10-CM | POA: Diagnosis not present

## 2019-10-06 DIAGNOSIS — S62615D Displaced fracture of proximal phalanx of left ring finger, subsequent encounter for fracture with routine healing: Secondary | ICD-10-CM | POA: Diagnosis not present

## 2019-10-06 DIAGNOSIS — S62617D Displaced fracture of proximal phalanx of left little finger, subsequent encounter for fracture with routine healing: Secondary | ICD-10-CM | POA: Diagnosis not present

## 2019-10-07 ENCOUNTER — Other Ambulatory Visit: Payer: Self-pay | Admitting: Family Medicine

## 2019-10-07 DIAGNOSIS — E1122 Type 2 diabetes mellitus with diabetic chronic kidney disease: Secondary | ICD-10-CM | POA: Diagnosis not present

## 2019-10-07 DIAGNOSIS — I2510 Atherosclerotic heart disease of native coronary artery without angina pectoris: Secondary | ICD-10-CM | POA: Diagnosis not present

## 2019-10-07 DIAGNOSIS — E785 Hyperlipidemia, unspecified: Secondary | ICD-10-CM | POA: Diagnosis not present

## 2019-10-07 DIAGNOSIS — N189 Chronic kidney disease, unspecified: Secondary | ICD-10-CM | POA: Diagnosis not present

## 2019-10-07 DIAGNOSIS — S62617D Displaced fracture of proximal phalanx of left little finger, subsequent encounter for fracture with routine healing: Secondary | ICD-10-CM | POA: Diagnosis not present

## 2019-10-07 DIAGNOSIS — E1169 Type 2 diabetes mellitus with other specified complication: Secondary | ICD-10-CM | POA: Diagnosis not present

## 2019-10-07 DIAGNOSIS — S0081XA Abrasion of other part of head, initial encounter: Secondary | ICD-10-CM | POA: Diagnosis not present

## 2019-10-07 DIAGNOSIS — I152 Hypertension secondary to endocrine disorders: Secondary | ICD-10-CM | POA: Diagnosis not present

## 2019-10-07 DIAGNOSIS — S62615D Displaced fracture of proximal phalanx of left ring finger, subsequent encounter for fracture with routine healing: Secondary | ICD-10-CM | POA: Diagnosis not present

## 2019-10-07 DIAGNOSIS — E1159 Type 2 diabetes mellitus with other circulatory complications: Secondary | ICD-10-CM | POA: Diagnosis not present

## 2019-10-11 DIAGNOSIS — E1169 Type 2 diabetes mellitus with other specified complication: Secondary | ICD-10-CM | POA: Diagnosis not present

## 2019-10-11 DIAGNOSIS — E1159 Type 2 diabetes mellitus with other circulatory complications: Secondary | ICD-10-CM | POA: Diagnosis not present

## 2019-10-11 DIAGNOSIS — I2510 Atherosclerotic heart disease of native coronary artery without angina pectoris: Secondary | ICD-10-CM | POA: Diagnosis not present

## 2019-10-11 DIAGNOSIS — S62617D Displaced fracture of proximal phalanx of left little finger, subsequent encounter for fracture with routine healing: Secondary | ICD-10-CM | POA: Diagnosis not present

## 2019-10-11 DIAGNOSIS — I152 Hypertension secondary to endocrine disorders: Secondary | ICD-10-CM | POA: Diagnosis not present

## 2019-10-11 DIAGNOSIS — E1122 Type 2 diabetes mellitus with diabetic chronic kidney disease: Secondary | ICD-10-CM | POA: Diagnosis not present

## 2019-10-11 DIAGNOSIS — S62615D Displaced fracture of proximal phalanx of left ring finger, subsequent encounter for fracture with routine healing: Secondary | ICD-10-CM | POA: Diagnosis not present

## 2019-10-11 DIAGNOSIS — E785 Hyperlipidemia, unspecified: Secondary | ICD-10-CM | POA: Diagnosis not present

## 2019-10-11 DIAGNOSIS — N189 Chronic kidney disease, unspecified: Secondary | ICD-10-CM | POA: Diagnosis not present

## 2019-10-11 NOTE — Telephone Encounter (Signed)
No answer, no voicemail.

## 2019-10-14 ENCOUNTER — Telehealth: Payer: Self-pay | Admitting: *Deleted

## 2019-10-14 MED ORDER — BLOOD GLUCOSE MONITOR SYSTEM W/DEVICE KIT: PACK | 0 refills | Status: DC

## 2019-10-14 NOTE — Telephone Encounter (Signed)
Pt called - aware this one is no longer approved  She is ok with a regular bS device   Generic BG device sent to [facility]

## 2019-10-14 NOTE — Telephone Encounter (Signed)
Prodigy AutoCode blood glucose with device kit IS NOT COVERED by pt plan  Per [employer] covered formulary alternatives MAY include:  Freestyle Libre 14 day sensor and reader (with a PA)

## 2019-10-14 NOTE — Telephone Encounter (Signed)
Please let her know she will have to get whatever kind of monitor is covered. Libre won't be approved either since she isn't taking mealtime insulin.

## 2019-10-15 NOTE — Telephone Encounter (Signed)
Pt was seen at Urgent Care 10/07/19 for this fall, will close encounter.

## 2019-10-21 DIAGNOSIS — E1159 Type 2 diabetes mellitus with other circulatory complications: Secondary | ICD-10-CM | POA: Diagnosis not present

## 2019-10-21 DIAGNOSIS — I2510 Atherosclerotic heart disease of native coronary artery without angina pectoris: Secondary | ICD-10-CM | POA: Diagnosis not present

## 2019-10-21 DIAGNOSIS — E1122 Type 2 diabetes mellitus with diabetic chronic kidney disease: Secondary | ICD-10-CM | POA: Diagnosis not present

## 2019-10-21 DIAGNOSIS — S62617D Displaced fracture of proximal phalanx of left little finger, subsequent encounter for fracture with routine healing: Secondary | ICD-10-CM | POA: Diagnosis not present

## 2019-10-21 DIAGNOSIS — E785 Hyperlipidemia, unspecified: Secondary | ICD-10-CM | POA: Diagnosis not present

## 2019-10-21 DIAGNOSIS — N189 Chronic kidney disease, unspecified: Secondary | ICD-10-CM | POA: Diagnosis not present

## 2019-10-21 DIAGNOSIS — S62615D Displaced fracture of proximal phalanx of left ring finger, subsequent encounter for fracture with routine healing: Secondary | ICD-10-CM | POA: Diagnosis not present

## 2019-10-21 DIAGNOSIS — I152 Hypertension secondary to endocrine disorders: Secondary | ICD-10-CM | POA: Diagnosis not present

## 2019-10-21 DIAGNOSIS — E1169 Type 2 diabetes mellitus with other specified complication: Secondary | ICD-10-CM | POA: Diagnosis not present

## 2019-10-27 ENCOUNTER — Ambulatory Visit (INDEPENDENT_AMBULATORY_CARE_PROVIDER_SITE_OTHER): Payer: Medicare HMO | Admitting: Licensed Clinical Social Worker

## 2019-10-27 DIAGNOSIS — E1159 Type 2 diabetes mellitus with other circulatory complications: Secondary | ICD-10-CM

## 2019-10-27 DIAGNOSIS — N183 Chronic kidney disease, stage 3 unspecified: Secondary | ICD-10-CM | POA: Diagnosis not present

## 2019-10-27 DIAGNOSIS — I2510 Atherosclerotic heart disease of native coronary artery without angina pectoris: Secondary | ICD-10-CM | POA: Diagnosis not present

## 2019-10-27 DIAGNOSIS — E1169 Type 2 diabetes mellitus with other specified complication: Secondary | ICD-10-CM | POA: Diagnosis not present

## 2019-10-27 DIAGNOSIS — E1122 Type 2 diabetes mellitus with diabetic chronic kidney disease: Secondary | ICD-10-CM | POA: Diagnosis not present

## 2019-10-27 DIAGNOSIS — I10 Essential (primary) hypertension: Secondary | ICD-10-CM

## 2019-10-27 DIAGNOSIS — E785 Hyperlipidemia, unspecified: Secondary | ICD-10-CM

## 2019-10-27 DIAGNOSIS — K2100 Gastro-esophageal reflux disease with esophagitis, without bleeding: Secondary | ICD-10-CM

## 2019-10-27 NOTE — Patient Instructions (Addendum)
Licensed Clinical Social Worker Visit Information  Goals we discussed today:    .  Client wants to talk with LCSW about mannaging streess issues faced (pt-stated)        Current Barriers:   Financial challenges in client with Chronic Diagnoses of GERD, CAD, TYpe 2 DM, HLD, HTN  Transport challenge  Clinical Social Work Clinical Goal(s):   Client to talk with LCSW in next 30 days about client managing stress issues  Interventions:  Talked with client about support with RNCM for nursing needs  Talked about Food Stamps benefit  Talked about Marijean Niemann [employer] support  Talked with client about pain issues of client  Talked with client aboutclient completion of ADLs  Talkedwith client about home health needs of client  Talked with client about transport needs of client Talked with [name] about ambulation needs of client (client has a cane and walker to use asa needed) Talked with [name] about her upcoming medical appointments Talked with [name] about Meals on Wheels support and putting her name on Meals on Wheels waiting list Talked with Jannine about sleeping challenges Talked with client about vision of client Talked with client about upcoming surgery on her fingers on her left hand) Talked with client about challenges in standing  Collaborated with RNCM about nursing needs of client  Patient Self Care Activities:   Takes medications as prescribed  Attends scheduled medical appointments  Plan: Client to attend medical appointments LCSW to call client in next 4 weeks to discuss psychosocial needs of client Client to talk with [facility] about resources   Initial goal documentation    Follow Up Plan: LCSW to call client in next 4 weeks to talk with client about the psychosocial needs of client at that time . Materials Provided: No  The patient verbalized understanding of instructions provided today and declined a print copy of patient  instruction materials.   [name].Yazir Koerber MSW, LCSW Licensed Clinical Social Worker [facility] Care Management [phone]

## 2019-10-27 NOTE — Chronic Care Management (AMB) (Signed)
Chronic Care Management    Clinical Social Work Follow Up Note  10/27/2019 Name: Tonya Carpenter MRN: [id number] DOB: [birth year]  Tonya Carpenter is a 74 y.o. year old female who is a primary care patient of [name], Fransisca Kaufmann, MD. The CCM team was consulted for assistance with [name] .   Review of patient status, including review of consultants reports, other relevant assessments, and collaboration with appropriate care team members and the patient's provider was performed as part of comprehensive patient evaluation and provision of chronic care management services.    SDOH (Social Determinants of Health) assessments performed: No;risk for financial strain; risk for stress; risk for transport needs; risk for depression;    Office Visit from 12/03/2018 in [facility]  PHQ-9 Total Score 6     GAD 7 : Generalized Anxiety Score 10/07/2018  Nervous, Anxious, on Edge 1  Control/stop worrying 1  Worry too much - different things 0  Trouble relaxing 1  Restless 0  Easily annoyed or irritable 0  Afraid - awful might happen 0  Total GAD 7 Score 3  Anxiety Difficulty Somewhat difficult    Outpatient Encounter Medications as of 10/27/2019  Medication Sig  . acetaminophen (TYLENOL) 500 MG tablet Take 1,000 mg by mouth every 6 (six) hours as needed for mild pain or headache.   . Alcohol Swabs (B-D SINGLE USE SWABS REGULAR) PADS USE TO CHECK BLOOD SUGAR TWICE DAILY AND AS NEEDED Dx E11.9  . ASPIRIN LOW DOSE 81 MG EC tablet TAKE 1 TABLET EVERY DAY  . atorvastatin (LIPITOR) 20 MG tablet Take 1 tablet (20 mg total) by mouth daily.  . Blood Glucose Calibration (TRUE METRIX LEVEL 1) Low SOLN Use as needed with BS machine  . Blood Glucose Monitoring Suppl (BLOOD GLUCOSE MONITOR SYSTEM) w/Device KIT Test BS BID and PRN dx.E11.9  . diclofenac Sodium (VOLTAREN) 1 % GEL Apply 2 g topically 4 (four) times daily as needed for pain.  Marland Kitchen glucose blood (TRUE METRIX BLOOD GLUCOSE  TEST) test strip Teset BS BID Dx E11.9  . hydrALAZINE (APRESOLINE) 10 MG tablet Take 1 tablet (10 mg total) by mouth 2 (two) times daily.  Marland Kitchen JANUMET XR 50-500 MG TB24 Take 1 tablet by mouth 2 (two) times daily.  Elmore Guise Devices (TRUEDRAW LANCING DEVICE) MISC Teset BS BID Dx E11.9  . losartan-hydrochlorothiazide (HYZAAR) 50-12.5 MG tablet Take 1 tablet by mouth daily.  . metoprolol succinate (TOPROL-XL) 50 MG 24 hr tablet TAKE 1 TABLET (50 MG TOTAL) BY MOUTH DAILY. TAKE WITH OR IMMEDIATELY FOLLOWING A MEAL.  . nitroGLYCERIN (NITROSTAT) 0.4 MG SL tablet DISSOLVE 1 TABLET UNDER THE TONGUE EVERY 5 MINUTES AS NEEDED FOR CHEST PAIN  . omeprazole (PRILOSEC) 40 MG capsule TAKE 1 CAPSULE EVERY DAY  . polyethylene glycol (MIRALAX / GLYCOLAX) 17 g packet Take 17 g by mouth daily as needed.  . TRUEplus Lancets 30G MISC Teset BS BID Dx E11.9   No facility-administered encounter medications on file as of 10/27/2019.    Goals    .  Client wants to talk with LCSW about mannaging streess issues faced (pt-stated)      Current Barriers:  . Financial challenges in client with Chronic Diagnoses of GERD, CAD, TYpe 2 DM, HLD, HTN . Transport challenge  Clinical Social Work Clinical Goal(s):  . Client to talk with LCSW in next 30 days about client managing stress issues  Interventions:  Talked with client about support with [facility] for nursing needs  Talked about Food Stamps benefit  Talked about [facility] support  Talked with client about pain issues of client  Talked with client aboutclient completion of ADLs  Talkedwith client about home health needs of client  Talked with client about transport needs of client Talked with Tina about ambulation needs of client (client has a cane and walker to use asa needed) Talked with Belky about her upcoming medical appointments Talked with Skylene about Meals on Wheels support and putting her name on Meals on Wheels waiting list Talked with Ayriana  about sleeping challenges Talked with client about vision of client Talked with client about upcoming surgery on her fingers on her left hand) Talked with client about challenges in standing  Collaborated with RNCM about nursing needs of client  Patient Self Care Activities:  . Takes medications as prescribed . Attends scheduled medical appointments  Plan: Client to attend medical appointments LCSW to call client in next 4 weeks to discuss psychosocial needs of client Client to talk with [facility] about resources   Initial goal documentation    Follow Up Plan:  LCSW to call client in next 4 weeks to talk with client about the psychosocial needs of client at that time .  S. MSW, LCSW Licensed Clinical Social Worker [facility] Care Management [phone] 

## 2019-10-28 DIAGNOSIS — I152 Hypertension secondary to endocrine disorders: Secondary | ICD-10-CM | POA: Diagnosis not present

## 2019-10-28 DIAGNOSIS — S62615D Displaced fracture of proximal phalanx of left ring finger, subsequent encounter for fracture with routine healing: Secondary | ICD-10-CM | POA: Diagnosis not present

## 2019-10-28 DIAGNOSIS — I2510 Atherosclerotic heart disease of native coronary artery without angina pectoris: Secondary | ICD-10-CM | POA: Diagnosis not present

## 2019-10-28 DIAGNOSIS — E1169 Type 2 diabetes mellitus with other specified complication: Secondary | ICD-10-CM | POA: Diagnosis not present

## 2019-10-28 DIAGNOSIS — N189 Chronic kidney disease, unspecified: Secondary | ICD-10-CM | POA: Diagnosis not present

## 2019-10-28 DIAGNOSIS — E785 Hyperlipidemia, unspecified: Secondary | ICD-10-CM | POA: Diagnosis not present

## 2019-10-28 DIAGNOSIS — S62617D Displaced fracture of proximal phalanx of left little finger, subsequent encounter for fracture with routine healing: Secondary | ICD-10-CM | POA: Diagnosis not present

## 2019-10-28 DIAGNOSIS — E1159 Type 2 diabetes mellitus with other circulatory complications: Secondary | ICD-10-CM | POA: Diagnosis not present

## 2019-10-28 DIAGNOSIS — E1122 Type 2 diabetes mellitus with diabetic chronic kidney disease: Secondary | ICD-10-CM | POA: Diagnosis not present

## 2019-10-29 ENCOUNTER — Other Ambulatory Visit: Payer: Self-pay

## 2019-10-29 ENCOUNTER — Other Ambulatory Visit: Payer: Self-pay | Admitting: Family Medicine

## 2019-10-29 ENCOUNTER — Ambulatory Visit (INDEPENDENT_AMBULATORY_CARE_PROVIDER_SITE_OTHER): Payer: Medicare HMO

## 2019-10-29 DIAGNOSIS — I2510 Atherosclerotic heart disease of native coronary artery without angina pectoris: Secondary | ICD-10-CM | POA: Diagnosis not present

## 2019-10-29 DIAGNOSIS — E1122 Type 2 diabetes mellitus with diabetic chronic kidney disease: Secondary | ICD-10-CM

## 2019-10-29 DIAGNOSIS — E1169 Type 2 diabetes mellitus with other specified complication: Secondary | ICD-10-CM

## 2019-10-29 DIAGNOSIS — N189 Chronic kidney disease, unspecified: Secondary | ICD-10-CM

## 2019-10-29 DIAGNOSIS — I447 Left bundle-branch block, unspecified: Secondary | ICD-10-CM

## 2019-10-29 DIAGNOSIS — E1159 Type 2 diabetes mellitus with other circulatory complications: Secondary | ICD-10-CM | POA: Diagnosis not present

## 2019-10-29 DIAGNOSIS — E785 Hyperlipidemia, unspecified: Secondary | ICD-10-CM

## 2019-10-29 DIAGNOSIS — S62617D Displaced fracture of proximal phalanx of left little finger, subsequent encounter for fracture with routine healing: Secondary | ICD-10-CM

## 2019-10-29 DIAGNOSIS — S62615D Displaced fracture of proximal phalanx of left ring finger, subsequent encounter for fracture with routine healing: Secondary | ICD-10-CM

## 2019-10-29 DIAGNOSIS — I152 Hypertension secondary to endocrine disorders: Secondary | ICD-10-CM

## 2019-10-29 DIAGNOSIS — Z7984 Long term (current) use of oral hypoglycemic drugs: Secondary | ICD-10-CM

## 2019-10-29 DIAGNOSIS — Z9181 History of falling: Secondary | ICD-10-CM

## 2019-10-29 MED ORDER — DICLOFENAC SODIUM 1 % EX GEL: 2.0000 g | Freq: Four times a day (QID) | CUTANEOUS | 5 refills | Status: DC | PRN

## 2019-10-29 NOTE — Telephone Encounter (Signed)
  Prescription Request  10/29/2019  What is the name of the medication or equipment? Diclofnac Sodium gel. [name] is supposed to be calling it in at [facility]. Patient is out.  Have you contacted your pharmacy to request a refill? (if applicable) NO  Which pharmacy would you like this sent to? [facility]   Patient notified that their request is being sent to the clinical staff for review and that they should receive a response within 2 business days.

## 2019-10-29 NOTE — Telephone Encounter (Signed)
Refill sent in, patient aware 

## 2019-11-02 DIAGNOSIS — S62617D Displaced fracture of proximal phalanx of left little finger, subsequent encounter for fracture with routine healing: Secondary | ICD-10-CM | POA: Diagnosis not present

## 2019-11-02 DIAGNOSIS — E1169 Type 2 diabetes mellitus with other specified complication: Secondary | ICD-10-CM | POA: Diagnosis not present

## 2019-11-02 DIAGNOSIS — E1159 Type 2 diabetes mellitus with other circulatory complications: Secondary | ICD-10-CM | POA: Diagnosis not present

## 2019-11-02 DIAGNOSIS — S62615D Displaced fracture of proximal phalanx of left ring finger, subsequent encounter for fracture with routine healing: Secondary | ICD-10-CM | POA: Diagnosis not present

## 2019-11-02 DIAGNOSIS — I2510 Atherosclerotic heart disease of native coronary artery without angina pectoris: Secondary | ICD-10-CM | POA: Diagnosis not present

## 2019-11-02 DIAGNOSIS — N189 Chronic kidney disease, unspecified: Secondary | ICD-10-CM | POA: Diagnosis not present

## 2019-11-02 DIAGNOSIS — I152 Hypertension secondary to endocrine disorders: Secondary | ICD-10-CM | POA: Diagnosis not present

## 2019-11-02 DIAGNOSIS — E785 Hyperlipidemia, unspecified: Secondary | ICD-10-CM | POA: Diagnosis not present

## 2019-11-02 DIAGNOSIS — E1122 Type 2 diabetes mellitus with diabetic chronic kidney disease: Secondary | ICD-10-CM | POA: Diagnosis not present

## 2019-11-05 DIAGNOSIS — E1122 Type 2 diabetes mellitus with diabetic chronic kidney disease: Secondary | ICD-10-CM | POA: Diagnosis not present

## 2019-11-05 DIAGNOSIS — S62615D Displaced fracture of proximal phalanx of left ring finger, subsequent encounter for fracture with routine healing: Secondary | ICD-10-CM | POA: Diagnosis not present

## 2019-11-05 DIAGNOSIS — I2510 Atherosclerotic heart disease of native coronary artery without angina pectoris: Secondary | ICD-10-CM | POA: Diagnosis not present

## 2019-11-05 DIAGNOSIS — E1169 Type 2 diabetes mellitus with other specified complication: Secondary | ICD-10-CM | POA: Diagnosis not present

## 2019-11-05 DIAGNOSIS — S62617D Displaced fracture of proximal phalanx of left little finger, subsequent encounter for fracture with routine healing: Secondary | ICD-10-CM | POA: Diagnosis not present

## 2019-11-05 DIAGNOSIS — I152 Hypertension secondary to endocrine disorders: Secondary | ICD-10-CM | POA: Diagnosis not present

## 2019-11-05 DIAGNOSIS — E785 Hyperlipidemia, unspecified: Secondary | ICD-10-CM | POA: Diagnosis not present

## 2019-11-05 DIAGNOSIS — N189 Chronic kidney disease, unspecified: Secondary | ICD-10-CM | POA: Diagnosis not present

## 2019-11-05 DIAGNOSIS — E1159 Type 2 diabetes mellitus with other circulatory complications: Secondary | ICD-10-CM | POA: Diagnosis not present

## 2019-11-06 DIAGNOSIS — S62617D Displaced fracture of proximal phalanx of left little finger, subsequent encounter for fracture with routine healing: Secondary | ICD-10-CM | POA: Diagnosis not present

## 2019-11-06 DIAGNOSIS — I152 Hypertension secondary to endocrine disorders: Secondary | ICD-10-CM | POA: Diagnosis not present

## 2019-11-06 DIAGNOSIS — N189 Chronic kidney disease, unspecified: Secondary | ICD-10-CM | POA: Diagnosis not present

## 2019-11-06 DIAGNOSIS — E1122 Type 2 diabetes mellitus with diabetic chronic kidney disease: Secondary | ICD-10-CM | POA: Diagnosis not present

## 2019-11-06 DIAGNOSIS — E785 Hyperlipidemia, unspecified: Secondary | ICD-10-CM | POA: Diagnosis not present

## 2019-11-06 DIAGNOSIS — E1159 Type 2 diabetes mellitus with other circulatory complications: Secondary | ICD-10-CM | POA: Diagnosis not present

## 2019-11-06 DIAGNOSIS — E1169 Type 2 diabetes mellitus with other specified complication: Secondary | ICD-10-CM | POA: Diagnosis not present

## 2019-11-06 DIAGNOSIS — I2510 Atherosclerotic heart disease of native coronary artery without angina pectoris: Secondary | ICD-10-CM | POA: Diagnosis not present

## 2019-11-06 DIAGNOSIS — S62615D Displaced fracture of proximal phalanx of left ring finger, subsequent encounter for fracture with routine healing: Secondary | ICD-10-CM | POA: Diagnosis not present

## 2019-11-08 ENCOUNTER — Other Ambulatory Visit: Payer: Self-pay | Admitting: *Deleted

## 2019-11-08 MED ORDER — ACCU-CHEK GUIDE VI STRP
ORAL_STRIP | 3 refills | Status: DC
Start: 2019-11-08 — End: 2019-12-30

## 2019-11-08 MED ORDER — ACCU-CHEK GUIDE W/DEVICE KIT
PACK | 0 refills | Status: DC
Start: 2019-11-08 — End: 2020-07-21

## 2019-11-08 MED ORDER — BD SWAB SINGLE USE REGULAR PADS: MEDICATED_PAD | 3 refills | Status: DC

## 2019-11-08 MED ORDER — ACCU-CHEK SOFTCLIX LANCETS MISC
3 refills | Status: DC
Start: 2019-11-08 — End: 2021-01-12

## 2019-11-12 DIAGNOSIS — I152 Hypertension secondary to endocrine disorders: Secondary | ICD-10-CM | POA: Diagnosis not present

## 2019-11-12 DIAGNOSIS — E1122 Type 2 diabetes mellitus with diabetic chronic kidney disease: Secondary | ICD-10-CM | POA: Diagnosis not present

## 2019-11-12 DIAGNOSIS — S62617D Displaced fracture of proximal phalanx of left little finger, subsequent encounter for fracture with routine healing: Secondary | ICD-10-CM | POA: Diagnosis not present

## 2019-11-12 DIAGNOSIS — E785 Hyperlipidemia, unspecified: Secondary | ICD-10-CM | POA: Diagnosis not present

## 2019-11-12 DIAGNOSIS — E1169 Type 2 diabetes mellitus with other specified complication: Secondary | ICD-10-CM | POA: Diagnosis not present

## 2019-11-12 DIAGNOSIS — E1159 Type 2 diabetes mellitus with other circulatory complications: Secondary | ICD-10-CM | POA: Diagnosis not present

## 2019-11-12 DIAGNOSIS — S62615D Displaced fracture of proximal phalanx of left ring finger, subsequent encounter for fracture with routine healing: Secondary | ICD-10-CM | POA: Diagnosis not present

## 2019-11-12 DIAGNOSIS — N189 Chronic kidney disease, unspecified: Secondary | ICD-10-CM | POA: Diagnosis not present

## 2019-11-12 DIAGNOSIS — I2510 Atherosclerotic heart disease of native coronary artery without angina pectoris: Secondary | ICD-10-CM | POA: Diagnosis not present

## 2019-11-26 ENCOUNTER — Other Ambulatory Visit: Payer: Self-pay | Admitting: Family Medicine

## 2019-11-29 ENCOUNTER — Other Ambulatory Visit: Payer: Self-pay | Admitting: *Deleted

## 2019-11-29 MED ORDER — POLYETHYLENE GLYCOL 3350 17 G PO PACK: 17.0000 g | PACK | Freq: Every day | ORAL | 0 refills | Status: DC | PRN

## 2019-12-02 ENCOUNTER — Ambulatory Visit (INDEPENDENT_AMBULATORY_CARE_PROVIDER_SITE_OTHER): Payer: Medicare HMO | Admitting: Licensed Clinical Social Worker

## 2019-12-02 DIAGNOSIS — E785 Hyperlipidemia, unspecified: Secondary | ICD-10-CM

## 2019-12-02 DIAGNOSIS — N183 Chronic kidney disease, stage 3 unspecified: Secondary | ICD-10-CM | POA: Diagnosis not present

## 2019-12-02 DIAGNOSIS — E1169 Type 2 diabetes mellitus with other specified complication: Secondary | ICD-10-CM | POA: Diagnosis not present

## 2019-12-02 DIAGNOSIS — E1159 Type 2 diabetes mellitus with other circulatory complications: Secondary | ICD-10-CM | POA: Diagnosis not present

## 2019-12-02 DIAGNOSIS — I2510 Atherosclerotic heart disease of native coronary artery without angina pectoris: Secondary | ICD-10-CM

## 2019-12-02 DIAGNOSIS — K2100 Gastro-esophageal reflux disease with esophagitis, without bleeding: Secondary | ICD-10-CM

## 2019-12-02 DIAGNOSIS — I10 Essential (primary) hypertension: Secondary | ICD-10-CM | POA: Diagnosis not present

## 2019-12-02 DIAGNOSIS — E1122 Type 2 diabetes mellitus with diabetic chronic kidney disease: Secondary | ICD-10-CM | POA: Diagnosis not present

## 2019-12-02 NOTE — Patient Instructions (Addendum)
Licensed Clinical Social Worker Visit Information  Goals we discussed today:     .  Client wants to talk with LCSW about mannaging streess issues faced (pt-stated)       Current Barriers:   Financial challenges in client with Chronic Diagnoses of GERD, CAD, TYpe 2 DM, HLD, HTN  Transport challenge  Clinical Social Work Clinical Goal(s):   Client to talk with LCSW in next 30 days about client managing stress issues  Interventions: Talked with client about support with RNCM for nursing needs Talked with client about pain issues of client Talked with client aboutclient completion of ADLs Talkedwith client about home health needs of client Talked with client about transport needs of client Talked with [name] about ambulation needs of client(client has a cane and walker to use as needed) Talked with [name] about her upcoming medical appointments Talked with [name] about sleeping challenges Talked with client about vision of client Talked with client about challenges in standing  Provided counseling support for client (talked with client about death of her spouse) Talked with client about support from her church friends  Patient Self Care Activities:   Takes medications as prescribed  Attends scheduled medical appointments  Plan: Client to attend medical appointments LCSW to call client in next 4 weeks to discuss psychosocial needs of client   Initial goal documentation   Follow Up Plan: LCSW to call client in next 4 weeks to talk with client about the psychosocial needs of client at that time  Materials Provided: No  The patient verbalized understanding of instructions provided today and declined a print copy of patient instruction materials.   [name].Clements Toro MSW, LCSW Licensed Clinical Social Worker [facility] Care Management [phone]

## 2019-12-02 NOTE — Chronic Care Management (AMB) (Signed)
Chronic Care Management    Clinical Social Work Follow Up Note  12/02/2019 Name: Tonya Carpenter MRN: [id number] DOB: 09-Aug-1945  Tonya Carpenter is a 74 y.o. year old female who is a primary care patient of [name], Fransisca Kaufmann, MD. The CCM team was consulted for assistance with [name] .   Review of patient status, including review of consultants reports, other relevant assessments, and collaboration with appropriate care team members and the patient's provider was performed as part of comprehensive patient evaluation and provision of chronic care management services.    SDOH (Social Determinants of Health) assessments performed: No; risk for financial strain; risk for depression; risk for stress; risk for physical inactivity; risk for transport needs    Office Visit from 12/03/2018 in [facility]  PHQ-9 Total Score 6       GAD 7 : Generalized Anxiety Score 10/07/2018  Nervous, Anxious, on Edge 1  Control/stop worrying 1  Worry too much - different things 0  Trouble relaxing 1  Restless 0  Easily annoyed or irritable 0  Afraid - awful might happen 0  Total GAD 7 Score 3  Anxiety Difficulty Somewhat difficult    Outpatient Encounter Medications as of 12/02/2019  Medication Sig  . Accu-Chek Softclix Lancets lancets Test BS BID and PRN dx.E11.9  . acetaminophen (TYLENOL) 500 MG tablet Take 1,000 mg by mouth every 6 (six) hours as needed for mild pain or headache.   . Alcohol Swabs (B-D SINGLE USE SWABS REGULAR) PADS Test BS BID and PRN dx.E11.9  . ASPIRIN LOW DOSE 81 MG EC tablet TAKE 1 TABLET EVERY DAY  . atorvastatin (LIPITOR) 20 MG tablet Take 1 tablet (20 mg total) by mouth daily.  . Blood Glucose Calibration (TRUE METRIX LEVEL 1) Low SOLN Use as needed with BS machine  . Blood Glucose Monitoring Suppl (ACCU-CHEK GUIDE) w/Device KIT Test BS BID and PRN dx.E11.9  . diclofenac Sodium (VOLTAREN) 1 % GEL Apply 2 g topically 4 (four) times daily as needed.    Marland Kitchen glucose blood (ACCU-CHEK GUIDE) test strip Test BS BID and PRN dx.E11.9  . hydrALAZINE (APRESOLINE) 10 MG tablet Take 1 tablet (10 mg total) by mouth 2 (two) times daily.  Marland Kitchen JANUMET XR 50-500 MG TB24 Take 1 tablet by mouth 2 (two) times daily.  Elmore Guise Devices (TRUEDRAW LANCING DEVICE) MISC Teset BS BID Dx E11.9  . losartan-hydrochlorothiazide (HYZAAR) 50-12.5 MG tablet Take 1 tablet by mouth daily.  . metoprolol succinate (TOPROL-XL) 50 MG 24 hr tablet TAKE 1 TABLET (50 MG TOTAL) BY MOUTH DAILY. TAKE WITH OR IMMEDIATELY FOLLOWING A MEAL.  . nitroGLYCERIN (NITROSTAT) 0.4 MG SL tablet DISSOLVE 1 TABLET UNDER THE TONGUE EVERY 5 MINUTES AS NEEDED FOR CHEST PAIN  . omeprazole (PRILOSEC) 40 MG capsule TAKE 1 CAPSULE EVERY DAY  . polyethylene glycol (MIRALAX / GLYCOLAX) 17 g packet Take 17 g by mouth daily as needed.  . TRUEplus Lancets 30G MISC Teset BS BID Dx E11.9   No facility-administered encounter medications on file as of 12/02/2019.    Goals    .  Client wants to talk with LCSW about mannaging streess issues faced (pt-stated)      Current Barriers:  . Financial challenges in client with Chronic Diagnoses of GERD, CAD, TYpe 2 DM, HLD, HTN . Transport challenge  Clinical Social Work Clinical Goal(s):  . Client to talk with LCSW in next 30 days about client managing stress issues  Interventions: Talked with client  about support with RNCM for nursing needs Talked with client about pain issues of client Talked with client aboutclient completion of ADLs Talkedwith client about home health needs of client Talked with client about transport needs of client Talked with [place] about ambulation needs of client (client has a cane and walker to use as needed) Talked with Hava about her upcoming medical appointments Talked with [place] about sleeping challenges Talked with client about vision of client Talked with client about challenges in standing  Provided counseling support for client  (talked with client about death of her spouse) Talked with client about support from her church friends  Patient Self Care Activities:  . Takes medications as prescribed . Attends scheduled medical appointments  Plan: Client to attend medical appointments LCSW to call client in next 4 weeks to discuss psychosocial needs of client   Initial goal documentation   Follow Up Plan: LCSW to call client in next 4 weeks to talk with client about the psychosocial needs of client at that time   S. MSW, LCSW Licensed Clinical Social Worker [facility] Care [facility] [phone] 

## 2019-12-06 DIAGNOSIS — S62615A Displaced fracture of proximal phalanx of left ring finger, initial encounter for closed fracture: Secondary | ICD-10-CM | POA: Diagnosis not present

## 2019-12-06 DIAGNOSIS — S62617A Displaced fracture of proximal phalanx of left little finger, initial encounter for closed fracture: Secondary | ICD-10-CM | POA: Diagnosis not present

## 2019-12-06 DIAGNOSIS — S62617D Displaced fracture of proximal phalanx of left little finger, subsequent encounter for fracture with routine healing: Secondary | ICD-10-CM | POA: Diagnosis not present

## 2019-12-06 DIAGNOSIS — S62615D Displaced fracture of proximal phalanx of left ring finger, subsequent encounter for fracture with routine healing: Secondary | ICD-10-CM | POA: Diagnosis not present

## 2019-12-13 ENCOUNTER — Ambulatory Visit: Payer: Medicare HMO | Admitting: *Deleted

## 2019-12-14 ENCOUNTER — Telehealth: Payer: Self-pay | Admitting: Family Medicine

## 2019-12-14 NOTE — Telephone Encounter (Signed)
The talking meter Prodigy is not covered by her insurance. [employer] sent her the Accu-Chek guide on 11/19/19

## 2019-12-14 NOTE — Chronic Care Management (AMB) (Signed)
°  Chronic Care Management   Outreach Note  12/13/2019 Name: Tonya Carpenter MRN: [id number] DOB: Jul 15, 1945  Referred by: [name], Elige Radon, MD Reason for referral : Chronic Care Management (RN follow up)   An unsuccessful telephone outreach was attempted today. The patient was referred to the case management team for assistance with care management and care coordination.   Follow Up Plan: The care management team will reach out to the patient again over the next 30 days.   Demetrios Loll, BSN, RN-BC Embedded Chronic Care Manager [facility] Care Management Direct Dial: [phone]

## 2019-12-22 ENCOUNTER — Ambulatory Visit (INDEPENDENT_AMBULATORY_CARE_PROVIDER_SITE_OTHER): Payer: Medicare HMO | Admitting: *Deleted

## 2019-12-22 DIAGNOSIS — E785 Hyperlipidemia, unspecified: Secondary | ICD-10-CM | POA: Diagnosis not present

## 2019-12-22 DIAGNOSIS — E1169 Type 2 diabetes mellitus with other specified complication: Secondary | ICD-10-CM

## 2019-12-22 DIAGNOSIS — I152 Hypertension secondary to endocrine disorders: Secondary | ICD-10-CM

## 2019-12-22 DIAGNOSIS — E1122 Type 2 diabetes mellitus with diabetic chronic kidney disease: Secondary | ICD-10-CM

## 2019-12-22 DIAGNOSIS — N183 Chronic kidney disease, stage 3 unspecified: Secondary | ICD-10-CM | POA: Diagnosis not present

## 2019-12-22 DIAGNOSIS — E1159 Type 2 diabetes mellitus with other circulatory complications: Secondary | ICD-10-CM | POA: Diagnosis not present

## 2019-12-22 NOTE — Chronic Care Management (AMB) (Signed)
Chronic Care Management   Follow Up Note   12/22/2019 Name: Tonya Carpenter MRN: [id number] DOB: [birth year]  Referred by: [name], Fransisca Kaufmann, MD Reason for referral : Chronic Care Management (RN follow up)   Tonya Carpenter is a 74 y.o. year old female who is a primary care patient of [name], Fransisca Kaufmann, MD. The CCM team was consulted for assistance with chronic disease management and care coordination needs.    Review of patient status, including review of consultants reports, relevant laboratory and other test results, and collaboration with appropriate care team members and the patient's provider was performed as part of comprehensive patient evaluation and provision of chronic care management services.    SDOH (Social Determinants of Health) assessments performed: No See Care Plan activities for detailed interventions related to [facility])     Outpatient Encounter Medications as of 12/22/2019  Medication Sig  . Accu-Chek Softclix Lancets lancets Test BS BID and PRN dx.E11.9  . acetaminophen (TYLENOL) 500 MG tablet Take 1,000 mg by mouth every 6 (six) hours as needed for mild pain or headache.   . Alcohol Swabs (B-D SINGLE USE SWABS REGULAR) PADS Test BS BID and PRN dx.E11.9  . ASPIRIN LOW DOSE 81 MG EC tablet TAKE 1 TABLET EVERY DAY  . atorvastatin (LIPITOR) 20 MG tablet Take 1 tablet (20 mg total) by mouth daily.  . Blood Glucose Calibration (TRUE METRIX LEVEL 1) Low SOLN Use as needed with BS machine  . Blood Glucose Monitoring Suppl (ACCU-CHEK GUIDE) w/Device KIT Test BS BID and PRN dx.E11.9  . diclofenac Sodium (VOLTAREN) 1 % GEL Apply 2 g topically 4 (four) times daily as needed.  Marland Kitchen glucose blood (ACCU-CHEK GUIDE) test strip Test BS BID and PRN dx.E11.9  . hydrALAZINE (APRESOLINE) 10 MG tablet Take 1 tablet (10 mg total) by mouth 2 (two) times daily.  Marland Kitchen JANUMET XR 50-500 MG TB24 Take 1 tablet by mouth 2 (two) times daily.  Elmore Guise Devices (TRUEDRAW LANCING DEVICE) MISC Teset BS BID Dx  E11.9  . losartan-hydrochlorothiazide (HYZAAR) 50-12.5 MG tablet Take 1 tablet by mouth daily.  . metoprolol succinate (TOPROL-XL) 50 MG 24 hr tablet TAKE 1 TABLET (50 MG TOTAL) BY MOUTH DAILY. TAKE WITH OR IMMEDIATELY FOLLOWING A MEAL.  . nitroGLYCERIN (NITROSTAT) 0.4 MG SL tablet DISSOLVE 1 TABLET UNDER THE TONGUE EVERY 5 MINUTES AS NEEDED FOR CHEST PAIN  . omeprazole (PRILOSEC) 40 MG capsule TAKE 1 CAPSULE EVERY DAY  . polyethylene glycol (MIRALAX / GLYCOLAX) 17 g packet Take 17 g by mouth daily as needed.  . TRUEplus Lancets 30G MISC Teset BS BID Dx E11.9   No facility-administered encounter medications on file as of 12/22/2019.     Lab Results  Component Value Date   HGBA1C 6.9 10/01/2019   HGBA1C 6.8 06/09/2019   HGBA1C 7.2 (H) 12/03/2018   Lab Results  Component Value Date   MICROALBUR neg 02/02/2014   LDLCALC 91 07/31/2018   CREATININE 1.12 (H) 07/31/2018   BP Readings from Last 3 Encounters:  10/01/19 131/63  09/25/19 (!) 168/56  07/21/19 122/70     Goals Addressed              This Visit's Progress     Patient Stated   .  "I want to keep my blood sugar under control" (pt-stated)        CARE PLAN ENTRY (see longitudinal plan of care for additional care plan information)  Current Barriers:  . Chronic Disease Management support  and education needs related to diabetes  in a patient with hypertension, hyperlipidemia, and CAD . Literacy barriers  Nurse Case Manager Clinical Goal(s):  Marland Kitchen Over the next 90 days, patient will keep all scheduled medical appointments . Over the next 90 days, patient will continue to monitor and log daily blood sugar readings . Over the next 30 days, patient will talk with the CCM team regarding management of her chronic medical conditions  Interventions:  . Inter-disciplinary care team collaboration (see longitudinal plan of care) . Chart reviewed including recent office notes and lab results . Reviewed and discussed upcoming  appointments . Medications reviewed and discussed . Discussed home blood sugar testing o Testing 1 to 2 times daily o Discussed difficulty obtaining needed blood sugar monitoring supplies. Per patient, that has resolved.  . Encouraged patient to continue testing and recording blood sugar 1 to 2 times daily and to call PCP with any readings outside of recommended range . Encouraged patient to bring glucometer to next PCP visit  Patient Self Care Activities:  . Self administers medications as prescribed . Calls pharmacy for medication refills . Calls provider office for new concerns or questions . Unable to independently drive  Initial goal documentation         Plan:    The care management team will reach out to the patient again over the next 30 days.    appt with PCP on 01/06/20  Telephone appt with LCSW on 01/06/20  Chong Sicilian, BSN, RN-BC [place] [facility] [place] Management Direct Dial: [phone]

## 2019-12-22 NOTE — Patient Instructions (Signed)
Visit Information  Goals Addressed              This Visit's Progress     Patient Stated   .  "I want to keep my blood sugar under control" (pt-stated)        CARE PLAN ENTRY (see longitudinal plan of care for additional care plan information)  Current Barriers:  . Chronic Disease Management support and education needs related to diabetes  in a patient with hypertension, hyperlipidemia, and CAD . Literacy barriers  Nurse Case Manager Clinical Goal(s):  Marland Kitchen Over the next 90 days, patient will keep all scheduled medical appointments . Over the next 90 days, patient will continue to monitor and log daily blood sugar readings . Over the next 30 days, patient will talk with the CCM team regarding management of her chronic medical conditions  Interventions:  . Inter-disciplinary care team collaboration (see longitudinal plan of care) . Chart reviewed including recent office notes and lab results . Reviewed and discussed upcoming appointments . Medications reviewed and discussed . Discussed home blood sugar testing o Testing 1 to 2 times daily o Discussed difficulty obtaining needed blood sugar monitoring supplies. Per patient, that has resolved.  . Encouraged patient to continue testing and recording blood sugar 1 to 2 times daily and to call PCP with any readings outside of recommended range . Encouraged patient to bring glucometer to next PCP visit  Patient Self Care Activities:  . Self administers medications as prescribed . Calls pharmacy for medication refills . Calls provider office for new concerns or questions . Unable to independently drive  Initial goal documentation        Patient verbalizes understanding of instructions provided today.   Follow-up Plan  The care management team will reach out to the patient again over the next 30 days.    appt with PCP on 01/06/20  Telephone appt with LCSW on 01/06/20  Demetrios Loll, BSN, RN-BC [facility] Chronic Care  Manager [facility] / [facility] Care Management Direct Dial: [phone]

## 2019-12-25 ENCOUNTER — Other Ambulatory Visit: Payer: Self-pay | Admitting: Family Medicine

## 2019-12-25 DIAGNOSIS — N183 Chronic kidney disease, stage 3 unspecified: Secondary | ICD-10-CM

## 2019-12-25 DIAGNOSIS — E1122 Type 2 diabetes mellitus with diabetic chronic kidney disease: Secondary | ICD-10-CM

## 2019-12-28 DIAGNOSIS — S62617A Displaced fracture of proximal phalanx of left little finger, initial encounter for closed fracture: Secondary | ICD-10-CM | POA: Diagnosis not present

## 2019-12-28 DIAGNOSIS — S62615A Displaced fracture of proximal phalanx of left ring finger, initial encounter for closed fracture: Secondary | ICD-10-CM | POA: Diagnosis not present

## 2019-12-30 ENCOUNTER — Other Ambulatory Visit: Payer: Self-pay | Admitting: *Deleted

## 2019-12-30 MED ORDER — TRUE METRIX BLOOD GLUCOSE TEST VI STRP
ORAL_STRIP | 3 refills | Status: DC
Start: 2019-12-30 — End: 2020-08-07

## 2020-01-06 ENCOUNTER — Encounter: Payer: Self-pay | Admitting: Family Medicine

## 2020-01-06 ENCOUNTER — Other Ambulatory Visit: Payer: Self-pay

## 2020-01-06 ENCOUNTER — Telehealth: Payer: Medicare HMO

## 2020-01-06 ENCOUNTER — Ambulatory Visit (INDEPENDENT_AMBULATORY_CARE_PROVIDER_SITE_OTHER): Payer: Medicare HMO | Admitting: Family Medicine

## 2020-01-06 VITALS — BP 131/57 | HR 60 | Temp 97.2°F | Ht 63.0 in | Wt 156.0 lb

## 2020-01-06 DIAGNOSIS — E1122 Type 2 diabetes mellitus with diabetic chronic kidney disease: Secondary | ICD-10-CM

## 2020-01-06 DIAGNOSIS — I152 Hypertension secondary to endocrine disorders: Secondary | ICD-10-CM | POA: Diagnosis not present

## 2020-01-06 DIAGNOSIS — K2100 Gastro-esophageal reflux disease with esophagitis, without bleeding: Secondary | ICD-10-CM | POA: Diagnosis not present

## 2020-01-06 DIAGNOSIS — N183 Chronic kidney disease, stage 3 unspecified: Secondary | ICD-10-CM

## 2020-01-06 DIAGNOSIS — E1169 Type 2 diabetes mellitus with other specified complication: Secondary | ICD-10-CM | POA: Diagnosis not present

## 2020-01-06 DIAGNOSIS — E785 Hyperlipidemia, unspecified: Secondary | ICD-10-CM

## 2020-01-06 DIAGNOSIS — E1159 Type 2 diabetes mellitus with other circulatory complications: Secondary | ICD-10-CM

## 2020-01-06 DIAGNOSIS — N1832 Chronic kidney disease, stage 3b: Secondary | ICD-10-CM

## 2020-01-06 LAB — BAYER DCA HB A1C WAIVED: HB A1C (BAYER DCA - WAIVED): 7.4 % — ABNORMAL HIGH (ref <7.0)

## 2020-01-06 LAB — LIPID PANEL

## 2020-01-06 MED ORDER — OMEPRAZOLE 40 MG PO CPDR: 40.0000 mg | DELAYED_RELEASE_CAPSULE | Freq: Every day | ORAL | 3 refills | Status: DC

## 2020-01-06 MED ORDER — ATORVASTATIN CALCIUM 20 MG PO TABS: 20.0000 mg | ORAL_TABLET | Freq: Every day | ORAL | 3 refills | Status: DC

## 2020-01-06 MED ORDER — HYDRALAZINE HCL 10 MG PO TABS: 10.0000 mg | ORAL_TABLET | Freq: Two times a day (BID) | ORAL | 3 refills | Status: DC

## 2020-01-06 MED ORDER — LOSARTAN POTASSIUM-HCTZ 50-12.5 MG PO TABS: 1.0000 | ORAL_TABLET | Freq: Every day | ORAL | 3 refills | Status: DC

## 2020-01-06 MED ORDER — JANUMET XR 50-500 MG PO TB24: 1.0000 | ORAL_TABLET | Freq: Two times a day (BID) | ORAL | 3 refills | Status: DC

## 2020-01-06 NOTE — Progress Notes (Signed)
BP (!) 131/57    Pulse 60    Temp (!) 97.2 F (36.2 C)    Ht _0  (1.6 m)    Wt 156 lb (70.8 kg)    SpO2 100%    BMI 27.63 kg/m    Subjective:   Patient ID: Tonya Carpenter, female    DOB: 1945/08/03, 74 y.o.   MRN: [id number]  HPI: Tonya Carpenter is a 74 y.o. female presenting on 01/06/2020 for Medical Management of Chronic Issues, Diabetes, and Hypertension   HPI Type 2 diabetes mellitus Patient comes in today for recheck of his diabetes. Patient has been currently taking Janumet. Patient is currently on an ACE inhibitor/ARB. Patient has not seen an ophthalmologist this year. Patient denies any issues with their feet. The symptom started onset as an adult hypertension hyperlipidemia and GERD ARE RELATED TO DM   Hypertension Patient is currently on losartan hydrochlorothiazide and hydralazine and metoprolol, and their blood pressure today is 131/57. Patient denies any lightheadedness or dizziness. Patient denies headaches, blurred vision, chest pains, shortness of breath, or weakness. Denies any side effects from medication and is content with current medication.   Hyperlipidemia Patient is coming in for recheck of his hyperlipidemia. The patient is currently taking atorvastatin. They deny any issues with myalgias or history of liver damage from it. They deny any focal numbness or weakness or chest pain.   GERD Patient is currently on omeprazole.  She denies any major symptoms or abdominal pain or belching or burping. She denies any blood in her stool or lightheadedness or dizziness.   Relevant past medical, surgical, family and social history reviewed and updated as indicated. Interim medical history since our last visit reviewed. Allergies and medications reviewed and updated.  Review of Systems  Constitutional: Negative for chills and fever.  HENT: Negative for congestion, ear discharge and ear pain.   Eyes: Negative for redness and visual disturbance.  Respiratory: Negative for chest  tightness and shortness of breath.   Cardiovascular: Negative for chest pain and leg swelling.  Genitourinary: Negative for difficulty urinating and dysuria.  Musculoskeletal: Negative for back pain and gait problem.  Skin: Negative for rash.  Neurological: Negative for light-headedness and headaches.  Psychiatric/Behavioral: Negative for agitation and behavioral problems.  All other systems reviewed and are negative.   Per HPI unless specifically indicated above   Allergies as of 01/06/2020      Reactions   Trilyte [peg [id number] Bicarb-nacl]    Nausea/vomtiing      Medication List       Accurate as of January 06, 2020 10:49 AM. If you have any questions, ask your nurse or doctor.        Accu-Chek Guide w/Device Kit Test BS BID and PRN dx.E11.9   acetaminophen 500 MG tablet Commonly known as: TYLENOL Take 1,000 mg by mouth every 6 (six) hours as needed for mild pain or headache.   Aspirin Low Dose 81 MG EC tablet Generic drug: aspirin TAKE 1 TABLET EVERY DAY   atorvastatin 20 MG tablet Commonly known as: LIPITOR Take 1 tablet (20 mg total) by mouth daily.   B-D SINGLE USE SWABS REGULAR Pads Test BS BID and PRN dx.E11.9   diclofenac Sodium 1 % Gel Commonly known as: VOLTAREN Apply 2 g topically 4 (four) times daily as needed.   hydrALAZINE 10 MG tablet Commonly known as: APRESOLINE Take 1 tablet (10 mg total) by mouth 2 (two) times daily.   Janumet XR 50-500 MG  Tb24 Generic drug: SitaGLIPtin-MetFORMIN HCl Take 1 tablet by mouth 2 (two) times daily.   losartan-hydrochlorothiazide 50-12.5 MG tablet Commonly known as: HYZAAR Take 1 tablet by mouth daily.   metoprolol succinate 50 MG 24 hr tablet Commonly known as: TOPROL-XL TAKE 1 TABLET (50 MG TOTAL) BY MOUTH DAILY. TAKE WITH OR IMMEDIATELY FOLLOWING A MEAL.   nitroGLYCERIN 0.4 MG SL tablet Commonly known as: NITROSTAT DISSOLVE 1 TABLET UNDER THE TONGUE EVERY 5 MINUTES AS NEEDED FOR CHEST PAIN     omeprazole 40 MG capsule Commonly known as: PRILOSEC TAKE 1 CAPSULE EVERY DAY   polyethylene glycol 17 g packet Commonly known as: MIRALAX / GLYCOLAX Take 17 g by mouth daily as needed.   True Metrix Blood Glucose Test test strip Generic drug: glucose blood Test BS BID and PRN dx.E11.9   True Metrix Level 1 Low Soln Use as needed with BS machine   TRUEdraw Lancing Device Misc Teset BS BID Dx E11.9   TRUEplus Lancets 30G Misc Teset BS BID Dx E11.9   Accu-Chek Softclix Lancets lancets Test BS BID and PRN dx.E11.9   Prodigy Twist Top Lancets 28G Misc TEST BLOOD SUGAR TWICE DAILY Dx E11.9        Objective:   BP (!) 131/57    Pulse 60    Temp (!) 97.2 F (36.2 C)    Ht $R'5\' 3"'Zb$  (1.6 m)    Wt 156 lb (70.8 kg)    SpO2 100%    BMI 27.63 kg/m   Wt Readings from Last 3 Encounters:  01/06/20 156 lb (70.8 kg)  10/01/19 156 lb 6.4 oz (70.9 kg)  09/24/19 156 lb (70.8 kg)    Physical Exam Vitals and nursing note reviewed.  Constitutional:      General: She is not in acute distress.    Appearance: She is well-developed. She is not diaphoretic.  Eyes:     Conjunctiva/sclera: Conjunctivae normal.  Cardiovascular:     Rate and Rhythm: Normal rate and regular rhythm.     Heart sounds: Normal heart sounds. No murmur heard.   Pulmonary:     Effort: Pulmonary effort is normal. No respiratory distress.     Breath sounds: Normal breath sounds. No wheezing.  Musculoskeletal:        General: No tenderness. Normal range of motion.  Skin:    General: Skin is warm and dry.     Findings: No rash.  Neurological:     Mental Status: She is alert and oriented to person, place, and time.     Coordination: Coordination normal.  Psychiatric:        Behavior: Behavior normal.       Assessment & Plan:   Problem List Items Addressed This Visit      Cardiovascular and Mediastinum   Hypertension associated with diabetes ([place])   Relevant Medications   atorvastatin (LIPITOR) 20 MG  tablet   hydrALAZINE (APRESOLINE) 10 MG tablet   JANUMET XR 50-500 MG TB24   losartan-hydrochlorothiazide (HYZAAR) 50-12.5 MG tablet     Digestive   GERD (gastroesophageal reflux disease)   Relevant Medications   omeprazole (PRILOSEC) 40 MG capsule   Other Relevant Orders   CBC with Differential/Platelet     Endocrine   Type 2 diabetes mellitus (HCC) - Primary   Relevant Medications   atorvastatin (LIPITOR) 20 MG tablet   JANUMET XR 50-500 MG TB24   losartan-hydrochlorothiazide (HYZAAR) 50-12.5 MG tablet   Other Relevant Orders   Bayer DCA Hb A1c  Waived   CBC with Differential/Platelet   CMP14+EGFR   Hyperlipidemia associated with type 2 diabetes mellitus (HCC)   Relevant Medications   atorvastatin (LIPITOR) 20 MG tablet   JANUMET XR 50-500 MG TB24   losartan-hydrochlorothiazide (HYZAAR) 50-12.5 MG tablet   Other Relevant Orders   Lipid panel    Other Visit Diagnoses    Type 2 diabetes mellitus with stage 3 chronic kidney disease, without long-term current use of insulin (HCC)       Relevant Medications   atorvastatin (LIPITOR) 20 MG tablet   JANUMET XR 50-500 MG TB24   losartan-hydrochlorothiazide (HYZAAR) 50-12.5 MG tablet   Other Relevant Orders   Bayer DCA Hb A1c Waived   CBC with Differential/Platelet   CMP14+EGFR      Continue current medication, no changes.  She seems to be doing well. Follow up plan: Return in about 3 months (around 04/07/2020), or if symptoms worsen or fail to improve, for diabetes and htn.  Counseling provided for all of the vaccine components Orders Placed This Encounter  Procedures   Bayer Loa Hb A1c Playita Cortada Dorella Laster, MD [facility] 01/06/2020, 10:49 AM

## 2020-01-07 LAB — CMP14+EGFR
ALT: 12 IU/L (ref 0–32)
AST: 16 IU/L (ref 0–40)
Albumin/Globulin Ratio: 1.2 (ref 1.2–2.2)
Albumin: 4 g/dL (ref 3.7–4.7)
Alkaline Phosphatase: 60 IU/L (ref 44–121)
BUN/Creatinine Ratio: 10 — ABNORMAL LOW (ref 12–28)
BUN: 14 mg/dL (ref 8–27)
Bilirubin Total: 0.5 mg/dL (ref 0.0–1.2)
CO2: 26 mmol/L (ref 20–29)
Calcium: 9.8 mg/dL (ref 8.7–10.3)
Chloride: 102 mmol/L (ref 96–106)
Creatinine, Ser: 1.42 mg/dL — ABNORMAL HIGH (ref 0.57–1.00)
GFR calc Af Amer: 43 mL/min/{1.73_m2} — ABNORMAL LOW (ref >59)
GFR calc non Af Amer: 37 mL/min/{1.73_m2} — ABNORMAL LOW (ref >59)
Globulin, Total: 3.3 g/dL (ref 1.5–4.5)
Glucose: 128 mg/dL — ABNORMAL HIGH (ref 65–99)
Potassium: 3.8 mmol/L (ref 3.5–5.2)
Sodium: 142 mmol/L (ref 134–144)
Total Protein: 7.3 g/dL (ref 6.0–8.5)

## 2020-01-07 LAB — CBC WITH DIFFERENTIAL/PLATELET
Basophils Absolute: 0 10*3/uL (ref 0.0–0.2)
Basos: 0 %
EOS (ABSOLUTE): 0.1 10*3/uL (ref 0.0–0.4)
Eos: 1 %
Hematocrit: 34.8 % (ref 34.0–46.6)
Hemoglobin: 11.3 g/dL (ref 11.1–15.9)
Immature Grans (Abs): 0 10*3/uL (ref 0.0–0.1)
Immature Granulocytes: 0 %
Lymphocytes Absolute: 2.1 10*3/uL (ref 0.7–3.1)
Lymphs: 25 %
MCH: 27.9 pg (ref 26.6–33.0)
MCHC: 32.5 g/dL (ref 31.5–35.7)
MCV: 86 fL (ref 79–97)
Monocytes Absolute: 0.6 10*3/uL (ref 0.1–0.9)
Monocytes: 7 %
Neutrophils Absolute: 5.6 10*3/uL (ref 1.4–7.0)
Neutrophils: 67 %
Platelets: 217 10*3/uL (ref 150–450)
RBC: 4.05 x10E6/uL (ref 3.77–5.28)
RDW: 12.5 % (ref 11.7–15.4)
WBC: 8.5 10*3/uL (ref 3.4–10.8)

## 2020-01-07 LAB — LIPID PANEL
Chol/HDL Ratio: 2.8 ratio (ref 0.0–4.4)
Cholesterol, Total: 158 mg/dL (ref 100–199)
HDL: 56 mg/dL (ref >39)
LDL Chol Calc (NIH): 83 mg/dL (ref 0–99)
Triglycerides: 105 mg/dL (ref 0–149)
VLDL Cholesterol Cal: 19 mg/dL (ref 5–40)

## 2020-01-11 ENCOUNTER — Other Ambulatory Visit: Payer: Self-pay | Admitting: Family Medicine

## 2020-01-11 DIAGNOSIS — E785 Hyperlipidemia, unspecified: Secondary | ICD-10-CM

## 2020-01-11 DIAGNOSIS — I152 Hypertension secondary to endocrine disorders: Secondary | ICD-10-CM

## 2020-01-11 DIAGNOSIS — E1159 Type 2 diabetes mellitus with other circulatory complications: Secondary | ICD-10-CM

## 2020-01-11 DIAGNOSIS — E1169 Type 2 diabetes mellitus with other specified complication: Secondary | ICD-10-CM

## 2020-01-17 ENCOUNTER — Telehealth: Payer: Self-pay

## 2020-01-17 NOTE — Telephone Encounter (Signed)
Pt advised it is recommended for anyone over the age of 60 and has been longer than 6 months to get the COVID booster. Pt voiced understanding and states "I'm going to wait and just leave it to GOD" and hung up.

## 2020-02-03 ENCOUNTER — Ambulatory Visit: Payer: Medicare HMO | Admitting: Licensed Clinical Social Worker

## 2020-02-03 DIAGNOSIS — K2100 Gastro-esophageal reflux disease with esophagitis, without bleeding: Secondary | ICD-10-CM

## 2020-02-03 DIAGNOSIS — N183 Chronic kidney disease, stage 3 unspecified: Secondary | ICD-10-CM

## 2020-02-03 DIAGNOSIS — E1169 Type 2 diabetes mellitus with other specified complication: Secondary | ICD-10-CM

## 2020-02-03 DIAGNOSIS — I152 Hypertension secondary to endocrine disorders: Secondary | ICD-10-CM

## 2020-02-03 DIAGNOSIS — E1122 Type 2 diabetes mellitus with diabetic chronic kidney disease: Secondary | ICD-10-CM

## 2020-02-03 DIAGNOSIS — I2510 Atherosclerotic heart disease of native coronary artery without angina pectoris: Secondary | ICD-10-CM

## 2020-02-03 NOTE — Patient Instructions (Addendum)
Licensed Clinical Social Worker Visit Information  Goals we discussed today:     Client wants to talk with LCSW about mannaging streess issues faced (pt-stated)        Current Barriers:   Financial challenges in client with Chronic Diagnoses of GERD, CAD, TYpe 2 DM, HLD, HTN  Transport challenge  Clinical Social Work Clinical Goal(s):   Client to talk with LCSW in next 30 days about client managing stress issues  Interventions:  Talked with client about CCM services  Talked with client about support with RNCM for nursing needs  Talked with [name] about pain issues experienced  Talked with [name] about medication procurement of client  Talked with Vicky about sleeping issues of client  Talked with client about her upcoming medical appointments  Talked with client about food provision of client  Talked with client about vision of client (plans to see eye doctor in April of 2022)  Provided client with name and phone number of RNCM Demetrios Loll for client to call with nursing questions  Patient Self Care Activities:   Takes medications as prescribed  Attends scheduled medical appointments  Plan: Client to attend medical appointments LCSW to call client in next 3 weeks to discuss psychosocial needs of client Client to talk with [facility] about resources   Initial goal documentation    Follow Up Plan:LCSW to call client in next 4 weeks to talk with client about the psychosocial needs of client at that time  Materials Provided: No  The patient verbalized understanding of instructions provided today and declined a print copy of patient instruction materials.   [name].Sanvika Cuttino MSW, LCSW Licensed Clinical Social Worker [facility] Care Management [phone]

## 2020-02-03 NOTE — Chronic Care Management (AMB) (Signed)
Chronic Care Management    Clinical Social Work Follow Up Note  02/03/2020 Name: Tonya Carpenter MRN: [id number] DOB: [birth year]  Tonya Carpenter is a 74 y.o. year old female who is a primary care patient of [name], Fransisca Kaufmann, MD. The CCM team was consulted for assistance with [name] .   Review of patient status, including review of consultants reports, other relevant assessments, and collaboration with appropriate care team members and the patient's provider was performed as part of comprehensive patient evaluation and provision of chronic care management services.    SDOH (Social Determinants of Health) assessments performed: No;risk for depression; risk for financial strain; risk for tobacco use; risk for social isolation    Office Visit from 12/03/2018 in [place]  PHQ-9 Total Score 6     GAD 7 : Generalized Anxiety Score 10/07/2018  Nervous, Anxious, on Edge 1  Control/stop worrying 1  Worry too much - different things 0  Trouble relaxing 1  Restless 0  Easily annoyed or irritable 0  Afraid - awful might happen 0  Total GAD 7 Score 3  Anxiety Difficulty Somewhat difficult    Outpatient Encounter Medications as of 02/03/2020  Medication Sig  . Accu-Chek Softclix Lancets lancets Test BS BID and PRN dx.E11.9  . acetaminophen (TYLENOL) 500 MG tablet Take 1,000 mg by mouth every 6 (six) hours as needed for mild pain or headache.   . Alcohol Swabs (B-D SINGLE USE SWABS REGULAR) PADS Test BS BID and PRN dx.E11.9  . ASPIRIN LOW DOSE 81 MG EC tablet TAKE 1 TABLET EVERY DAY  . atorvastatin (LIPITOR) 20 MG tablet TAKE 1 TABLET EVERY DAY  . Blood Glucose Calibration (TRUE METRIX LEVEL 1) Low SOLN Use as needed with BS machine  . Blood Glucose Monitoring Suppl (ACCU-CHEK GUIDE) w/Device KIT Test BS BID and PRN dx.E11.9  . diclofenac Sodium (VOLTAREN) 1 % GEL Apply 2 g topically 4 (four) times daily as needed.  Marland Kitchen glucose blood (TRUE METRIX BLOOD GLUCOSE  TEST) test strip Test BS BID and PRN dx.E11.9  . hydrALAZINE (APRESOLINE) 10 MG tablet Take 1 tablet (10 mg total) by mouth 2 (two) times daily.  Marland Kitchen JANUMET XR 50-500 MG TB24 Take 1 tablet by mouth 2 (two) times daily.  Elmore Guise Devices (TRUEDRAW LANCING DEVICE) MISC Teset BS BID Dx E11.9  . losartan-hydrochlorothiazide (HYZAAR) 50-12.5 MG tablet TAKE 1 TABLET EVERY DAY  . metoprolol succinate (TOPROL-XL) 50 MG 24 hr tablet TAKE 1 TABLET (50 MG TOTAL) BY MOUTH DAILY. TAKE WITH OR IMMEDIATELY FOLLOWING A MEAL.  . nitroGLYCERIN (NITROSTAT) 0.4 MG SL tablet DISSOLVE 1 TABLET UNDER THE TONGUE EVERY 5 MINUTES AS NEEDED FOR CHEST PAIN  . omeprazole (PRILOSEC) 40 MG capsule Take 1 capsule (40 mg total) by mouth daily.  . polyethylene glycol (MIRALAX / GLYCOLAX) 17 g packet Take 17 g by mouth daily as needed.  . Prodigy Twist Top Lancets 28G MISC TEST BLOOD SUGAR TWICE DAILY Dx E11.9  . TRUEplus Lancets 30G MISC Teset BS BID Dx E11.9   No facility-administered encounter medications on file as of 02/03/2020.    Goals    .  Client wants to talk with LCSW about mannaging streess issues faced (pt-stated)      Current Barriers:  . Financial challenges in client with Chronic Diagnoses of GERD, CAD, TYpe 2 DM, HLD, HTN . Transport challenge  Clinical Social Work Clinical Goal(s):  . Client to talk with LCSW in next 30 days  about client managing stress issues  Interventions: . Talked with client about CCM services . Talked with client about support with [facility] for nursing needs . Talked with [name] about pain issues experienced . Talked with [name] about medication procurement of client . Talked with [name] about sleeping issues of client . Talked with client about her upcoming medical appointments . Talked with client about food provision of client . Talked with client about vision of client (plans to see eye doctor in April of 2022) . Provided client with name and phone number of RNCM Chong Sicilian for  client to call with nursing questions  Patient Self Care Activities:  . Takes medications as prescribed . Attends scheduled medical appointments  Plan: Client to attend medical appointments LCSW to call client in next 3 weeks to discuss psychosocial needs of client Client to talk with [facility] about resources   Initial goal documentation    Follow Up Plan: LCSW to call client in next 4 weeks to talk with client about the psychosocial needs of client at that time  [name].Bertie Simien MSW, LCSW Licensed Clinical Social Worker [facility] Care Management [phone]

## 2020-02-07 ENCOUNTER — Telehealth: Payer: Medicare HMO

## 2020-02-19 ENCOUNTER — Other Ambulatory Visit: Payer: Self-pay | Admitting: Family Medicine

## 2020-02-25 ENCOUNTER — Other Ambulatory Visit: Payer: Self-pay | Admitting: *Deleted

## 2020-02-25 DIAGNOSIS — K2100 Gastro-esophageal reflux disease with esophagitis, without bleeding: Secondary | ICD-10-CM

## 2020-02-25 DIAGNOSIS — E1159 Type 2 diabetes mellitus with other circulatory complications: Secondary | ICD-10-CM

## 2020-02-25 MED ORDER — OMEPRAZOLE 40 MG PO CPDR: 40.0000 mg | DELAYED_RELEASE_CAPSULE | Freq: Every day | ORAL | 2 refills | Status: DC

## 2020-02-25 MED ORDER — HYDRALAZINE HCL 10 MG PO TABS: 10.0000 mg | ORAL_TABLET | Freq: Two times a day (BID) | ORAL | 2 refills | Status: DC

## 2020-02-25 MED ORDER — METOPROLOL SUCCINATE ER 50 MG PO TB24: 50.0000 mg | ORAL_TABLET | Freq: Every day | ORAL | 2 refills | Status: DC

## 2020-02-28 ENCOUNTER — Other Ambulatory Visit: Payer: Self-pay

## 2020-02-28 MED ORDER — POLYETHYLENE GLYCOL 3350 17 G PO PACK: 17.0000 g | PACK | Freq: Every day | ORAL | 0 refills | Status: DC | PRN

## 2020-03-06 ENCOUNTER — Ambulatory Visit: Payer: Medicare HMO | Admitting: Licensed Clinical Social Worker

## 2020-03-06 DIAGNOSIS — K2100 Gastro-esophageal reflux disease with esophagitis, without bleeding: Secondary | ICD-10-CM

## 2020-03-06 DIAGNOSIS — E785 Hyperlipidemia, unspecified: Secondary | ICD-10-CM

## 2020-03-06 DIAGNOSIS — N183 Chronic kidney disease, stage 3 unspecified: Secondary | ICD-10-CM

## 2020-03-06 DIAGNOSIS — I2510 Atherosclerotic heart disease of native coronary artery without angina pectoris: Secondary | ICD-10-CM

## 2020-03-06 DIAGNOSIS — E1159 Type 2 diabetes mellitus with other circulatory complications: Secondary | ICD-10-CM

## 2020-03-06 NOTE — Patient Instructions (Addendum)
Licensed Clinical Social Worker Visit Information  Goals we discussed today:    Client wants to talk with LCSW about mannaging streess issues faced (pt-stated)          Current Barriers:   Financial challenges in client with Chronic Diagnoses of GERD, CAD, TYpe 2 DM, HLD, HTN  Transport challenge  Clinical Social Work Clinical Goal(s):   Client to talk with LCSW in next 30 days about client managing stress issues  Interventions:  Talked with client about CCM services  Talked with client about support with RNCM for nursing needs  Talked with [name] about pain issues experienced  Talked with [name] about medication procurement of client  Talked with Chakira about sleeping issues of client  Talked with client about her upcoming medical appointments  Talked with client about food provision of client  Talked with client about vision of client (plans to see eye doctor in April of 2022)  Provided client with name and phone number of RNCM Demetrios Loll for client to call with nursing questions  Talked with client about decreased energy of client  Talked with client about ambulation issues of client  Talked with client about family support  Talked with client about transport needs of client  Collaborated with RNCM regarding nursing needs of client  Patient Self Care Activities:   Takes medications as prescribed  Attends scheduled medical appointments  Plan: Client to attend medical appointments LCSW to call client in next 4 weeks to discuss psychosocial needs of client Client to talk with [facility] about resources    Initial goal documentation     Follow Up Plan:LCSW to call client in next 4 weeks to talk with client about the psychosocial needs of client at that time  Materials Provided: No  The patient verbalized understanding of instructions provided today and declined a print copy of patient instruction materials.   [name].Zaydon Kinser MSW,  LCSW Licensed Clinical Social Worker [facility] Care Management [phone]

## 2020-03-06 NOTE — Chronic Care Management (AMB) (Signed)
Chronic Care Management    Clinical Social Work Follow Up Note  03/06/2020 Name: AMBERLYNN TEMPESTA MRN: [id number] DOB: November 26, 1945  Leilani Able Mattos is a 74 y.o. year old female who is a primary care patient of [name], Fransisca Kaufmann, MD. The CCM team was consulted for assistance with [name] .   Review of patient status, including review of consultants reports, other relevant assessments, and collaboration with appropriate care team members and the patient's provider was performed as part of comprehensive patient evaluation and provision of chronic care management services.    SDOH (Social Determinants of Health) assessments performed: No; risk for financial strain; risk for depression; risk for stress; risk for social isolation; risk for physical inactivity  [place] Office Visit from 12/03/2018 in [place]  PHQ-9 Total Score 6      GAD 7 : Generalized Anxiety Score 10/07/2018  Nervous, Anxious, on Edge 1  Control/stop worrying 1  Worry too much - different things 0  Trouble relaxing 1  Restless 0  Easily annoyed or irritable 0  Afraid - awful might happen 0  Total GAD 7 Score 3  Anxiety Difficulty Somewhat difficult    Outpatient Encounter Medications as of 03/06/2020  Medication Sig  . Accu-Chek Softclix Lancets lancets Test BS BID and PRN dx.E11.9  . acetaminophen (TYLENOL) 500 MG tablet Take 1,000 mg by mouth every 6 (six) hours as needed for mild pain or headache.   . Alcohol Swabs (B-D SINGLE USE SWABS REGULAR) PADS Test BS BID and PRN dx.E11.9  . ASPIRIN LOW DOSE 81 MG EC tablet TAKE 1 TABLET EVERY DAY  . atorvastatin (LIPITOR) 20 MG tablet TAKE 1 TABLET EVERY DAY  . Blood Glucose Calibration (TRUE METRIX LEVEL 1) Low SOLN Use as needed with BS machine  . Blood Glucose Monitoring Suppl (ACCU-CHEK GUIDE) w/Device KIT Test BS BID and PRN dx.E11.9  . diclofenac Sodium (VOLTAREN) 1 % GEL Apply 2 g topically 4 (four) times daily as needed.  Marland Kitchen  glucose blood (TRUE METRIX BLOOD GLUCOSE TEST) test strip Test BS BID and PRN dx.E11.9  . hydrALAZINE (APRESOLINE) 10 MG tablet Take 1 tablet (10 mg total) by mouth 2 (two) times daily.  Marland Kitchen JANUMET XR 50-500 MG TB24 Take 1 tablet by mouth 2 (two) times daily.  Elmore Guise Devices (TRUEDRAW LANCING DEVICE) MISC Teset BS BID Dx E11.9  . losartan-hydrochlorothiazide (HYZAAR) 50-12.5 MG tablet TAKE 1 TABLET EVERY DAY  . metoprolol succinate (TOPROL-XL) 50 MG 24 hr tablet Take 1 tablet (50 mg total) by mouth daily. Take with or immediately following a meal.  . nitroGLYCERIN (NITROSTAT) 0.4 MG SL tablet DISSOLVE 1 TABLET UNDER THE TONGUE EVERY 5 MINUTES AS NEEDED FOR CHEST PAIN  . omeprazole (PRILOSEC) 40 MG capsule Take 1 capsule (40 mg total) by mouth daily.  . polyethylene glycol (MIRALAX / GLYCOLAX) 17 g packet Take 17 g by mouth daily as needed.  . Prodigy Twist Top Lancets 28G MISC TEST BLOOD SUGAR TWICE DAILY Dx E11.9  . TRUEplus Lancets 30G MISC Teset BS BID Dx E11.9   No facility-administered encounter medications on file as of 03/06/2020.    Goals    .  Client wants to talk with LCSW about mannaging streess issues faced (pt-stated)      Current Barriers:  . Financial challenges in client with Chronic Diagnoses of GERD, CAD, TYpe 2 DM, HLD, HTN . Transport challenge  Clinical Social Work Clinical Goal(s):  . Client to talk with  LCSW in next 30 days about client managing stress issues  Interventions:  Talked with client about CCM services  Talked with client about support with RNCM for nursing needs  Talked with [name] about pain issues experienced  Talked with [name] about medication procurement of client  Talked with Natalya about sleeping issues of client  Talked with client about her upcoming medical appointments  Talked with client about food provision of client  Talked with client about vision of client (plans to see eye doctor in April of 2022)  Provided client with name and  phone number of RNCM Chong Sicilian for client to call with nursing questions  Talked with client about decreased energy of client  Talked with client about ambulation issues of client  Talked with client about family support  Talked with client about transport needs of client  Collaborated with [facility] regarding nursing needs of client  Patient Self Care Activities:  . Takes medications as prescribed . Attends scheduled medical appointments  Plan: Client to attend medical appointments LCSW to call client in next 4 weeks to discuss psychosocial needs of client Client to talk with [facility] about resources    Initial goal documentation     Follow Up Plan:LCSW to call client in next 4 weeks to talk with client about the psychosocial needs of client at that time  [name].Alvie Fowles MSW, LCSW Licensed Clinical Social Worker [facility] Care Management [phone]

## 2020-03-08 ENCOUNTER — Encounter: Payer: Self-pay | Admitting: *Deleted

## 2020-03-22 ENCOUNTER — Telehealth: Payer: Self-pay

## 2020-03-22 ENCOUNTER — Ambulatory Visit: Payer: Medicare HMO | Admitting: *Deleted

## 2020-03-22 DIAGNOSIS — E1122 Type 2 diabetes mellitus with diabetic chronic kidney disease: Secondary | ICD-10-CM

## 2020-03-22 DIAGNOSIS — N183 Chronic kidney disease, stage 3 unspecified: Secondary | ICD-10-CM

## 2020-03-22 DIAGNOSIS — I152 Hypertension secondary to endocrine disorders: Secondary | ICD-10-CM

## 2020-03-22 DIAGNOSIS — E1159 Type 2 diabetes mellitus with other circulatory complications: Secondary | ICD-10-CM

## 2020-03-22 NOTE — Telephone Encounter (Signed)
  Prescription Request  03/22/2020  What is the name of the medication or equipment? JANUMET XR 50-500 MG TB24   Have you contacted your pharmacy to request a refill? (if applicable) yes  Which pharmacy would you like this sent to? [facility]    Patient notified that their request is being sent to the clinical staff for review and that they should receive a response within 2 business days.

## 2020-03-22 NOTE — Chronic Care Management (AMB) (Signed)
Chronic Care Management   Follow Up Note   03/22/2020 Name: Tonya Carpenter MRN: [id number] DOB: 1945/04/30  Referred by: [name], Fransisca Kaufmann, MD Reason for referral : Chronic Care Management (RN follow up)   Tonya Carpenter is a 75 y.o. year old female who is a primary care patient of [name], Fransisca Kaufmann, MD. The CCM team was consulted for assistance with chronic disease management and care coordination needs.    Review of patient status, including review of consultants reports, relevant laboratory and other test results, and collaboration with appropriate care team members and the patient's provider was performed as part of comprehensive patient evaluation and provision of chronic care management services.    SDOH (Social Determinants of Health) assessments performed: No See Care Plan activities for detailed interventions related to [facility])     Outpatient Encounter Medications as of 03/22/2020  Medication Sig  . Accu-Chek Softclix Lancets lancets Test BS BID and PRN dx.E11.9  . acetaminophen (TYLENOL) 500 MG tablet Take 1,000 mg by mouth every 6 (six) hours as needed for mild pain or headache.   . Alcohol Swabs (B-D SINGLE USE SWABS REGULAR) PADS Test BS BID and PRN dx.E11.9  . ASPIRIN LOW DOSE 81 MG EC tablet TAKE 1 TABLET EVERY DAY  . atorvastatin (LIPITOR) 20 MG tablet TAKE 1 TABLET EVERY DAY  . Blood Glucose Calibration (TRUE METRIX LEVEL 1) Low SOLN Use as needed with BS machine  . Blood Glucose Monitoring Suppl (ACCU-CHEK GUIDE) w/Device KIT Test BS BID and PRN dx.E11.9  . diclofenac Sodium (VOLTAREN) 1 % GEL Apply 2 g topically 4 (four) times daily as needed.  Marland Kitchen glucose blood (TRUE METRIX BLOOD GLUCOSE TEST) test strip Test BS BID and PRN dx.E11.9  . hydrALAZINE (APRESOLINE) 10 MG tablet Take 1 tablet (10 mg total) by mouth 2 (two) times daily.  Marland Kitchen JANUMET XR 50-500 MG TB24 Take 1 tablet by mouth 2 (two) times daily.  Elmore Guise Devices (TRUEDRAW LANCING DEVICE) MISC Teset BS BID Dx E11.9   . losartan-hydrochlorothiazide (HYZAAR) 50-12.5 MG tablet TAKE 1 TABLET EVERY DAY  . metoprolol succinate (TOPROL-XL) 50 MG 24 hr tablet Take 1 tablet (50 mg total) by mouth daily. Take with or immediately following a meal.  . nitroGLYCERIN (NITROSTAT) 0.4 MG SL tablet DISSOLVE 1 TABLET UNDER THE TONGUE EVERY 5 MINUTES AS NEEDED FOR CHEST PAIN  . omeprazole (PRILOSEC) 40 MG capsule Take 1 capsule (40 mg total) by mouth daily.  . polyethylene glycol (MIRALAX / GLYCOLAX) 17 g packet Take 17 g by mouth daily as needed.  . Prodigy Twist Top Lancets 28G MISC TEST BLOOD SUGAR TWICE DAILY Dx E11.9  . TRUEplus Lancets 30G MISC Teset BS BID Dx E11.9   No facility-administered encounter medications on file as of 03/22/2020.     Goals Addressed              This Visit's Progress     Patient Stated   .  "I want to keep my blood sugar under control" (pt-stated)        CARE PLAN ENTRY (see longitudinal plan of care for additional care plan information)  Current Barriers:  . Chronic Disease Management support and education needs related to diabetes  in a patient with hypertension, hyperlipidemia, and CAD . Literacy barriers  Nurse Case Manager Clinical Goal(s):  Marland Kitchen Over the next 90 days, patient will keep all scheduled medical appointments . Over the next 90 days, patient will continue to monitor and log  daily blood sugar readings . Over the next 45 days, patient will talk with the CCM team regarding management of her chronic medical conditions  Interventions:  . Inter-disciplinary care team collaboration (see longitudinal plan of care) . Chart reviewed including recent office notes and lab results . Reviewed and discussed upcoming appointments . Medications reviewed and discussed o Patient called [facility] requesting refill on Janumet. Advised that a script for a 90 day supply with 3 refills was sent to [facility] on 01/06/20 and a receipt confirmation was received. o Asked patient to call  [facility] to see if they can fill that Rx now . Discussed home blood sugar testing o Testing 1 to 2 times daily o "blood sugars are good" . Encouraged patient to continue testing and recording blood sugar 1 to 2 times daily and to call PCP with any readings outside of recommended range . Encouraged patient to bring glucometer to next PCP visit . Reviewed upcoming appointments . Encouraged patient to reach out to [facility] as needed  Patient Self Care Activities:  . Self administers medications as prescribed . Calls pharmacy for medication refills . Calls provider office for new concerns or questions . Unable to independently drive  Please see past updates related to this goal by clicking on the "Past Updates" button in the selected goal          Plan:  Telephone follow up appointment with care management team member scheduled for: 04/12/20 with LCSW Next PCP appointment scheduled for: 04/07/20 with Dr [name]  Chong Sicilian, BSN, RN-BC [facility] Management Direct Dial: [phone]

## 2020-03-22 NOTE — Patient Instructions (Signed)
Visit Information  Goals Addressed              This Visit's Progress     Patient Stated   .  "I want to keep my blood sugar under control" (pt-stated)        CARE PLAN ENTRY (see longitudinal plan of care for additional care plan information)  Current Barriers:  . Chronic Disease Management support and education needs related to diabetes  in a patient with hypertension, hyperlipidemia, and CAD . Literacy barriers  Nurse Case Manager Clinical Goal(s):  Marland Kitchen Over the next 90 days, patient will keep all scheduled medical appointments . Over the next 90 days, patient will continue to monitor and log daily blood sugar readings . Over the next 45 days, patient will talk with the CCM team regarding management of her chronic medical conditions  Interventions:  . Inter-disciplinary care team collaboration (see longitudinal plan of care) . Chart reviewed including recent office notes and lab results . Reviewed and discussed upcoming appointments . Medications reviewed and discussed o Patient called [facility] requesting refill on Janumet. Advised that a script for a 90 day supply with 3 refills was sent to [facility] on 01/06/20 and a receipt confirmation was received. o Asked patient to call [facility] to see if they can fill that Rx now . Discussed home blood sugar testing o Testing 1 to 2 times daily o "blood sugars are good" . Encouraged patient to continue testing and recording blood sugar 1 to 2 times daily and to call PCP with any readings outside of recommended range . Encouraged patient to bring glucometer to next PCP visit . Reviewed upcoming appointments . Encouraged patient to reach out to [facility] as needed  Patient Self Care Activities:  . Self administers medications as prescribed . Calls pharmacy for medication refills . Calls provider office for new concerns or questions . Unable to independently drive  Please see past updates related to this goal by clicking on  the "Past Updates" button in the selected goal         The patient verbalized understanding of instructions, educational materials, and care plan provided today and declined offer to receive copy of patient instructions, educational materials, and care plan.   Plan:  Telephone follow up appointment with care management team member scheduled for: 04/12/20 with LCSW Next PCP appointment scheduled for: 04/07/20 with Dr [name]  Demetrios Loll, BSN, RN-BC Embedded Chronic Care Manager [facility] Care Management Direct Dial: [phone]

## 2020-03-22 NOTE — Telephone Encounter (Signed)
03/22/2020  Patient advised that a script for a 90 day supply with 3 refills was sent to [facility] on 01/06/20 and we received a receipt confirmation from the pharmacy. Patient reports that she is out of medication. If she filled it on 01/06/20 she shouldn't be out until close to 04/07/20 and insurance may not cover this early. Advised patient to call [facility] pharmacy and see if they can fill one of the refills on file and if there is a problem to give the office a call back.    Demetrios Loll, BSN, RN-BC Embedded Chronic Care Manager [facility] Care Management Direct Dial: [phone]

## 2020-03-22 NOTE — Telephone Encounter (Signed)
Attempted to contact patient - NA °

## 2020-03-29 ENCOUNTER — Other Ambulatory Visit: Payer: Self-pay | Admitting: Family Medicine

## 2020-03-29 ENCOUNTER — Other Ambulatory Visit: Payer: Self-pay | Admitting: Family

## 2020-03-29 DIAGNOSIS — E1122 Type 2 diabetes mellitus with diabetic chronic kidney disease: Secondary | ICD-10-CM

## 2020-03-29 DIAGNOSIS — N1832 Chronic kidney disease, stage 3b: Secondary | ICD-10-CM

## 2020-04-07 ENCOUNTER — Telehealth: Payer: Self-pay

## 2020-04-07 ENCOUNTER — Other Ambulatory Visit: Payer: Self-pay

## 2020-04-07 ENCOUNTER — Encounter: Payer: Self-pay | Admitting: Family Medicine

## 2020-04-07 ENCOUNTER — Ambulatory Visit (INDEPENDENT_AMBULATORY_CARE_PROVIDER_SITE_OTHER): Payer: Medicare HMO | Admitting: Family Medicine

## 2020-04-07 VITALS — BP 132/66 | HR 67 | Ht 63.0 in | Wt 160.0 lb

## 2020-04-07 DIAGNOSIS — E1159 Type 2 diabetes mellitus with other circulatory complications: Secondary | ICD-10-CM | POA: Diagnosis not present

## 2020-04-07 DIAGNOSIS — K2100 Gastro-esophageal reflux disease with esophagitis, without bleeding: Secondary | ICD-10-CM | POA: Diagnosis not present

## 2020-04-07 DIAGNOSIS — N1832 Chronic kidney disease, stage 3b: Secondary | ICD-10-CM

## 2020-04-07 DIAGNOSIS — I152 Hypertension secondary to endocrine disorders: Secondary | ICD-10-CM

## 2020-04-07 DIAGNOSIS — E785 Hyperlipidemia, unspecified: Secondary | ICD-10-CM

## 2020-04-07 DIAGNOSIS — E1122 Type 2 diabetes mellitus with diabetic chronic kidney disease: Secondary | ICD-10-CM

## 2020-04-07 DIAGNOSIS — N183 Chronic kidney disease, stage 3 unspecified: Secondary | ICD-10-CM | POA: Diagnosis not present

## 2020-04-07 DIAGNOSIS — E1169 Type 2 diabetes mellitus with other specified complication: Secondary | ICD-10-CM

## 2020-04-07 LAB — BAYER DCA HB A1C WAIVED: HB A1C (BAYER DCA - WAIVED): 7.1 % — ABNORMAL HIGH (ref <7.0)

## 2020-04-07 MED ORDER — METOPROLOL SUCCINATE ER 50 MG PO TB24: 50.0000 mg | ORAL_TABLET | Freq: Every day | ORAL | 3 refills | Status: DC

## 2020-04-07 MED ORDER — OMEPRAZOLE 40 MG PO CPDR: 40.0000 mg | DELAYED_RELEASE_CAPSULE | Freq: Every day | ORAL | 3 refills | Status: DC

## 2020-04-07 MED ORDER — HYDRALAZINE HCL 10 MG PO TABS: 10.0000 mg | ORAL_TABLET | Freq: Two times a day (BID) | ORAL | 3 refills | Status: DC

## 2020-04-07 MED ORDER — ATORVASTATIN CALCIUM 20 MG PO TABS
20.0000 mg | ORAL_TABLET | Freq: Every day | ORAL | 3 refills | Status: DC
Start: 2020-04-07 — End: 2021-01-12

## 2020-04-07 MED ORDER — JANUMET XR 50-500 MG PO TB24: 1.0000 | ORAL_TABLET | Freq: Two times a day (BID) | ORAL | 3 refills | Status: DC

## 2020-04-07 MED ORDER — LOSARTAN POTASSIUM-HCTZ 50-12.5 MG PO TABS: 1.0000 | ORAL_TABLET | Freq: Every day | ORAL | 3 refills | Status: DC

## 2020-04-07 NOTE — Telephone Encounter (Signed)
Patient has diabetes which does give her a slightly lower immune system than the normal population but her diabetes has been under control for the most part and everything else is under control so I think we have maximized and I would not say that it is really low but just slightly lower than the general population who does not have diabetes.  So keeping her diabetes under control is very beneficial towards her immune system.  If she wants to take vitamin C every day to help boost her immune system then she can

## 2020-04-07 NOTE — Telephone Encounter (Signed)
Tried calling patient. No answer, no machine. 

## 2020-04-07 NOTE — Progress Notes (Signed)
BP 132/66    Pulse 67    Ht _0  (1.6 m)    Wt 160 lb (72.6 kg)    BMI 28.34 kg/m    Subjective:   Patient ID: Tonya Carpenter, female    DOB: [birth year], 75 y.o.   MRN: [id number]  HPI: Tonya Carpenter is a 75 y.o. female presenting on 04/07/2020 for Medical Management of Chronic Issues, Diabetes, and Hypertension   HPI Type 2 diabetes mellitus Patient comes in today for recheck of his diabetes. Patient has been currently taking Janumet. Patient is currently on an ACE inhibitor/ARB. Patient has not seen an ophthalmologist this year. Patient denies any issues with their feet. The symptom started onset as an adult hypertension and hyperlipidemia ARE RELATED TO DM   Hypertension Patient is currently on hydralazine and losartan hydrochlorothiazide and metoprolol, and their blood pressure today is 132/66. Patient denies any lightheadedness or dizziness. Patient denies headaches, blurred vision, chest pains, shortness of breath, or weakness. Denies any side effects from medication and is content with current medication.   Hyperlipidemia Patient is coming in for recheck of his hyperlipidemia. The patient is currently taking atorvastatin. They deny any issues with myalgias or history of liver damage from it. They deny any focal numbness or weakness or chest pain.   Relevant past medical, surgical, family and social history reviewed and updated as indicated. Interim medical history since our last visit reviewed. Allergies and medications reviewed and updated.  Review of Systems  Constitutional: Negative for chills and fever.  Eyes: Negative for redness and visual disturbance.  Respiratory: Negative for chest tightness and shortness of breath.   Cardiovascular: Negative for chest pain and leg swelling.  Musculoskeletal: Negative for back pain and gait problem.  Skin: Negative for rash.  Neurological: Negative for light-headedness and headaches.  Psychiatric/Behavioral: Negative for agitation and  behavioral problems.  All other systems reviewed and are negative.   Per HPI unless specifically indicated above   Allergies as of 04/07/2020      Reactions   Trilyte [peg [id number] Bicarb-nacl]    Nausea/vomtiing      Medication List       Accurate as of April 07, 2020 12:33 PM. If you have any questions, ask your nurse or doctor.        Accu-Chek Guide w/Device Kit Test BS BID and PRN dx.E11.9   acetaminophen 500 MG tablet Commonly known as: TYLENOL Take 1,000 mg by mouth every 6 (six) hours as needed for mild pain or headache.   Aspirin Low Dose 81 MG EC tablet Generic drug: aspirin TAKE 1 TABLET EVERY DAY   atorvastatin 20 MG tablet Commonly known as: LIPITOR TAKE 1 TABLET EVERY DAY   B-D SINGLE USE SWABS REGULAR Pads Test BS BID and PRN dx.E11.9   diclofenac Sodium 1 % Gel Commonly known as: VOLTAREN Apply 2 g topically 4 (four) times daily as needed.   hydrALAZINE 10 MG tablet Commonly known as: APRESOLINE Take 1 tablet (10 mg total) by mouth 2 (two) times daily.   Janumet XR 50-500 MG Tb24 Generic drug: SitaGLIPtin-MetFORMIN HCl TAKE 1 TABLET TWICE DAILY   losartan-hydrochlorothiazide 50-12.5 MG tablet Commonly known as: HYZAAR TAKE 1 TABLET EVERY DAY   metoprolol succinate 50 MG 24 hr tablet Commonly known as: TOPROL-XL Take 1 tablet (50 mg total) by mouth daily. Take with or immediately following a meal.   nitroGLYCERIN 0.4 MG SL tablet Commonly known as: NITROSTAT DISSOLVE 1 TABLET UNDER THE TONGUE  EVERY 5 MINUTES AS NEEDED FOR CHEST PAIN   omeprazole 40 MG capsule Commonly known as: PRILOSEC Take 1 capsule (40 mg total) by mouth daily.   polyethylene glycol 17 g packet Commonly known as: MIRALAX / GLYCOLAX Take 17 g by mouth daily as needed.   True Metrix Blood Glucose Test test strip Generic drug: glucose blood Test BS BID and PRN dx.E11.9   True Metrix Level 1 Low Soln Use as needed with BS machine   TRUEdraw Lancing Device  Misc Teset BS BID Dx E11.9   TRUEplus Lancets 30G Misc Teset BS BID Dx E11.9   Accu-Chek Softclix Lancets lancets Test BS BID and PRN dx.E11.9   Prodigy Twist Top Lancets 28G Misc TEST BLOOD SUGAR TWICE DAILY Dx E11.9        Objective:   BP 132/66    Pulse 67    Ht _0  (1.6 m)    Wt 160 lb (72.6 kg)    BMI 28.34 kg/m   Wt Readings from Last 3 Encounters:  04/07/20 160 lb (72.6 kg)  01/06/20 156 lb (70.8 kg)  10/01/19 156 lb 6.4 oz (70.9 kg)    Physical Exam Vitals and nursing note reviewed.  Constitutional:      General: She is not in acute distress.    Appearance: She is well-developed and well-nourished. She is not diaphoretic.  Eyes:     Extraocular Movements: EOM normal.     Conjunctiva/sclera: Conjunctivae normal.  Cardiovascular:     Rate and Rhythm: Normal rate and regular rhythm.     Pulses: Intact distal pulses.     Heart sounds: Normal heart sounds. No murmur heard.   Pulmonary:     Effort: Pulmonary effort is normal. No respiratory distress.     Breath sounds: Normal breath sounds. No wheezing.  Musculoskeletal:        General: No tenderness or edema. Normal range of motion.  Skin:    General: Skin is warm and dry.     Findings: No rash.  Neurological:     Mental Status: She is alert and oriented to person, place, and time.     Coordination: Coordination normal.  Psychiatric:        Mood and Affect: Mood and affect normal.        Behavior: Behavior normal.       Assessment & Plan:   Problem List Items Addressed This Visit      Cardiovascular and Mediastinum   Hypertension associated with diabetes ([place])   Relevant Medications   JANUMET XR 50-500 MG TB24   hydrALAZINE (APRESOLINE) 10 MG tablet   atorvastatin (LIPITOR) 20 MG tablet   losartan-hydrochlorothiazide (HYZAAR) 50-12.5 MG tablet   metoprolol succinate (TOPROL-XL) 50 MG 24 hr tablet   Other Relevant Orders   Bayer DCA Hb A1c Waived   CMP14+EGFR     Digestive   GERD  (gastroesophageal reflux disease)   Relevant Medications   omeprazole (PRILOSEC) 40 MG capsule     Endocrine   Type 2 diabetes mellitus (HCC) - Primary   Relevant Medications   JANUMET XR 50-500 MG TB24   atorvastatin (LIPITOR) 20 MG tablet   losartan-hydrochlorothiazide (HYZAAR) 50-12.5 MG tablet   Other Relevant Orders   Bayer DCA Hb A1c Waived   CMP14+EGFR   Hyperlipidemia associated with type 2 diabetes mellitus (HCC)   Relevant Medications   JANUMET XR 50-500 MG TB24   atorvastatin (LIPITOR) 20 MG tablet   losartan-hydrochlorothiazide (HYZAAR) 50-12.5 MG tablet  Other Relevant Orders   Bayer DCA Hb A1c Waived   CMP14+EGFR   CKD stage 3 due to type 2 diabetes mellitus (HCC)   Relevant Medications   JANUMET XR 50-500 MG TB24   atorvastatin (LIPITOR) 20 MG tablet   losartan-hydrochlorothiazide (HYZAAR) 50-12.5 MG tablet      Continue current medication, will recheck kidney levels.  A1c is 7.1 which is slightly up but only barely, focus on diet and continue current medication. Follow up plan: Return in about 3 months (around 07/06/2020), or if symptoms worsen or fail to improve, for Diabetes and hypertension and class.  Counseling provided for all of the vaccine components Orders Placed This Encounter  Procedures   Bayer [facility] Hb A1c Waived   Karnes Shemeka Wardle, MD [facility] 04/07/2020, 12:33 PM

## 2020-04-08 LAB — CMP14+EGFR
ALT: 10 IU/L (ref 0–32)
AST: 12 IU/L (ref 0–40)
Albumin/Globulin Ratio: 1.4 (ref 1.2–2.2)
Albumin: 4.6 g/dL (ref 3.7–4.7)
Alkaline Phosphatase: 61 IU/L (ref 44–121)
BUN/Creatinine Ratio: 14 (ref 12–28)
BUN: 21 mg/dL (ref 8–27)
Bilirubin Total: <0.2 mg/dL (ref 0.0–1.2)
CO2: 26 mmol/L (ref 20–29)
Calcium: 10.3 mg/dL (ref 8.7–10.3)
Chloride: 104 mmol/L (ref 96–106)
Creatinine, Ser: 1.45 mg/dL — ABNORMAL HIGH (ref 0.57–1.00)
GFR calc Af Amer: 41 mL/min/{1.73_m2} — ABNORMAL LOW (ref >59)
GFR calc non Af Amer: 36 mL/min/{1.73_m2} — ABNORMAL LOW (ref >59)
Globulin, Total: 3.4 g/dL (ref 1.5–4.5)
Glucose: 84 mg/dL (ref 65–99)
Potassium: 4.1 mmol/L (ref 3.5–5.2)
Sodium: 143 mmol/L (ref 134–144)
Total Protein: 8 g/dL (ref 6.0–8.5)

## 2020-04-12 ENCOUNTER — Ambulatory Visit: Payer: Medicare HMO | Admitting: Licensed Clinical Social Worker

## 2020-04-12 DIAGNOSIS — N183 Chronic kidney disease, stage 3 unspecified: Secondary | ICD-10-CM

## 2020-04-12 DIAGNOSIS — E1169 Type 2 diabetes mellitus with other specified complication: Secondary | ICD-10-CM

## 2020-04-12 DIAGNOSIS — E1159 Type 2 diabetes mellitus with other circulatory complications: Secondary | ICD-10-CM

## 2020-04-12 DIAGNOSIS — E785 Hyperlipidemia, unspecified: Secondary | ICD-10-CM

## 2020-04-12 DIAGNOSIS — E1122 Type 2 diabetes mellitus with diabetic chronic kidney disease: Secondary | ICD-10-CM

## 2020-04-12 DIAGNOSIS — K2100 Gastro-esophageal reflux disease with esophagitis, without bleeding: Secondary | ICD-10-CM

## 2020-04-12 DIAGNOSIS — I2510 Atherosclerotic heart disease of native coronary artery without angina pectoris: Secondary | ICD-10-CM

## 2020-04-12 DIAGNOSIS — I152 Hypertension secondary to endocrine disorders: Secondary | ICD-10-CM

## 2020-04-12 NOTE — Patient Instructions (Addendum)
Licensed Clinical Social Worker Visit Information  Goals we discussed today:  Goals Addressed              This Visit's Progress   .  Client wants to talk with LCSW about mannaging streess issues faced (pt-stated)        Current Barriers:  . Financial challenges in client with Chronic Diagnoses of GERD, CAD, Type 2 DM, HLD, HTN . Transport challenge  Clinical Social Work Clinical Goal(s):  . Client to talk with LCSW in next 30 days about client managing stress issues  Interventions: Talked with client about pain issues of client Talked with client about her upcoming medical appointments Talked with client about medication procurement for client Talked with client about vision of client Talked with client about CCM services Talked with client about support with RNCM for nursing needs Talked about Food Stamps benefit received by client Talked about [facility]jean Niemann [facility] support Talked with client about family support (client has decreased family support) Talked with client about transport for client (uses [employer] bus occasionally) Talked with client about her socialization with her friends [name] Talked with client about appetite of client Talked with client about her pain in her left hand  Patient Self Care Activities:  . Takes medications as prescribed . Attends scheduled medical appointments  Plan: Client to attend medical appointments LCSW to call client in next 4 weeks to discuss psychosocial needs of client Client to talk with [facility] about resources   Initial goal documentation       Materials Provided: No  Follow Up Plan: LCSW to call client in next 4 weeks to discuss the psychosocial needs of client at that time  The patient verbalized understanding of instructions provided today and declined a print copy of patient instruction materials.   [name].Garvin Ellena MSW, LCSW Licensed Clinical Social Worker [facility]  Care Management [phone]

## 2020-04-12 NOTE — Chronic Care Management (AMB) (Signed)
Chronic Care Management    Clinical Social Work Follow Up Note  04/12/2020 Name: Tonya Carpenter MRN: [id number] DOB: November 10, 1945  Tonya Carpenter is a 75 y.o. year old female who is a primary care patient of [name], Fransisca Kaufmann, MD. The CCM team was consulted for assistance with [name] .   Review of patient status, including review of consultants reports, other relevant assessments, and collaboration with appropriate care team members and the patient's provider was performed as part of comprehensive patient evaluation and provision of chronic care management services.    SDOH (Social Determinants of Health) assessments performed: No; risk for depression; risk for tobacco use; risk for stress; risk for physical inactivity  [place] Office Visit from 12/03/2018 in [place]  PHQ-9 Total Score 6     GAD 7 : Generalized Anxiety Score 10/07/2018  Nervous, Anxious, on Edge 1  Control/stop worrying 1  Worry too much - different things 0  Trouble relaxing 1  Restless 0  Easily annoyed or irritable 0  Afraid - awful might happen 0  Total GAD 7 Score 3  Anxiety Difficulty Somewhat difficult    Outpatient Encounter Medications as of 04/12/2020  Medication Sig  . Accu-Chek Softclix Lancets lancets Test BS BID and PRN dx.E11.9  . acetaminophen (TYLENOL) 500 MG tablet Take 1,000 mg by mouth every 6 (six) hours as needed for mild pain or headache.   . Alcohol Swabs (B-D SINGLE USE SWABS REGULAR) PADS Test BS BID and PRN dx.E11.9  . ASPIRIN LOW DOSE 81 MG EC tablet TAKE 1 TABLET EVERY DAY  . atorvastatin (LIPITOR) 20 MG tablet Take 1 tablet (20 mg total) by mouth daily.  . Blood Glucose Calibration (TRUE METRIX LEVEL 1) Low SOLN Use as needed with BS machine  . Blood Glucose Monitoring Suppl (ACCU-CHEK GUIDE) w/Device KIT Test BS BID and PRN dx.E11.9  . diclofenac Sodium (VOLTAREN) 1 % GEL Apply 2 g topically 4 (four) times daily as needed.  Marland Kitchen glucose blood (TRUE  METRIX BLOOD GLUCOSE TEST) test strip Test BS BID and PRN dx.E11.9  . hydrALAZINE (APRESOLINE) 10 MG tablet Take 1 tablet (10 mg total) by mouth 2 (two) times daily.  Marland Kitchen JANUMET XR 50-500 MG TB24 Take 1 tablet by mouth 2 (two) times daily.  Elmore Guise Devices (TRUEDRAW LANCING DEVICE) MISC Teset BS BID Dx E11.9  . losartan-hydrochlorothiazide (HYZAAR) 50-12.5 MG tablet Take 1 tablet by mouth daily.  . metoprolol succinate (TOPROL-XL) 50 MG 24 hr tablet Take 1 tablet (50 mg total) by mouth daily. Take with or immediately following a meal.  . nitroGLYCERIN (NITROSTAT) 0.4 MG SL tablet DISSOLVE 1 TABLET UNDER THE TONGUE EVERY 5 MINUTES AS NEEDED FOR CHEST PAIN  . omeprazole (PRILOSEC) 40 MG capsule Take 1 capsule (40 mg total) by mouth daily.  . polyethylene glycol (MIRALAX / GLYCOLAX) 17 g packet Take 17 g by mouth daily as needed.  . Prodigy Twist Top Lancets 28G MISC TEST BLOOD SUGAR TWICE DAILY Dx E11.9  . TRUEplus Lancets 30G MISC Teset BS BID Dx E11.9   No facility-administered encounter medications on file as of 04/12/2020.     Goals Addressed              This Visit's Progress   .  Client wants to talk with LCSW about mannaging streess issues faced (pt-stated)        Current Barriers:  . Financial challenges in client with Chronic Diagnoses of GERD, CAD, Type  2 DM, HLD, HTN . Transport challenge  Clinical Social Work Clinical Goal(s):  . Client to talk with LCSW in next 30 days about client managing stress issues  Interventions:  Talked with client about pain issues of client Talked with client about her upcoming medical appointments Talked with client about medication procurement for client Talked with client about vision of client Talked with client about CCM services Talked with client about support with RNCM for nursing needs Talked about Food Stamps benefit received by client Talked about [place] support Talked with client about family support (client  has decreased family support) Talked with client about transport for client (uses [employer] bus occasionally) Talked with client about her socialization with her friends [name] Talked with client about appetite of client Talked with client about her pain in her left hand   Patient Self Care Activities:  . Takes medications as prescribed . Attends scheduled medical appointments  Plan: Client to attend medical appointments LCSW to call client in next 4 weeks to discuss psychosocial needs of client Client to talk with [facility] about resources   Initial goal documentation        Follow Up Plan: LCSW to call client in next 4 weeks to discuss the psychosocial needs of client at that time  [name].Edom Schmuhl MSW, LCSW Licensed Clinical Social Worker [facility] Care Management [phone]

## 2020-04-17 ENCOUNTER — Other Ambulatory Visit: Payer: Self-pay | Admitting: Family Medicine

## 2020-04-17 DIAGNOSIS — Z1231 Encounter for screening mammogram for malignant neoplasm of breast: Secondary | ICD-10-CM

## 2020-05-01 ENCOUNTER — Encounter: Payer: Self-pay | Admitting: *Deleted

## 2020-05-08 ENCOUNTER — Other Ambulatory Visit: Payer: Medicare HMO

## 2020-05-10 ENCOUNTER — Ambulatory Visit
Admission: RE | Admit: 2020-05-10 | Discharge: 2020-05-10 | Disposition: A | Discharge status: Home / Self Care | Payer: Medicare HMO | Source: Ambulatory Visit | Attending: Family Medicine | Admitting: Family Medicine

## 2020-05-10 DIAGNOSIS — Z1231 Encounter for screening mammogram for malignant neoplasm of breast: Secondary | ICD-10-CM | POA: Diagnosis not present

## 2020-05-16 ENCOUNTER — Ambulatory Visit (INDEPENDENT_AMBULATORY_CARE_PROVIDER_SITE_OTHER): Payer: Medicare HMO | Admitting: Licensed Clinical Social Worker

## 2020-05-16 DIAGNOSIS — E1159 Type 2 diabetes mellitus with other circulatory complications: Secondary | ICD-10-CM | POA: Diagnosis not present

## 2020-05-16 DIAGNOSIS — N183 Chronic kidney disease, stage 3 unspecified: Secondary | ICD-10-CM | POA: Diagnosis not present

## 2020-05-16 DIAGNOSIS — E785 Hyperlipidemia, unspecified: Secondary | ICD-10-CM | POA: Diagnosis not present

## 2020-05-16 DIAGNOSIS — E1122 Type 2 diabetes mellitus with diabetic chronic kidney disease: Secondary | ICD-10-CM | POA: Diagnosis not present

## 2020-05-16 DIAGNOSIS — I152 Hypertension secondary to endocrine disorders: Secondary | ICD-10-CM | POA: Diagnosis not present

## 2020-05-16 DIAGNOSIS — K2100 Gastro-esophageal reflux disease with esophagitis, without bleeding: Secondary | ICD-10-CM

## 2020-05-16 DIAGNOSIS — I2510 Atherosclerotic heart disease of native coronary artery without angina pectoris: Secondary | ICD-10-CM

## 2020-05-16 DIAGNOSIS — E1169 Type 2 diabetes mellitus with other specified complication: Secondary | ICD-10-CM

## 2020-05-16 NOTE — Chronic Care Management (AMB) (Signed)
Chronic Care Management    Clinical Social Work Follow Up Note  05/16/2020 Name: Tonya Carpenter MRN: [id number] DOB: January 18, 1946  Tonya Carpenter is a 75 y.o. year old female who is a primary care patient of [name], Fransisca Kaufmann, MD. The CCM team was consulted for assistance with [name] .   Review of patient status, including review of consultants reports, other relevant assessments, and collaboration with appropriate care team members and the patient's provider was performed as part of comprehensive patient evaluation and provision of chronic care management services.    SDOH (Social Determinants of Health) assessments performed: No; risk for depression; risk for tobacco use; risk for stress; risk for physical inactivity  [place] Office Visit from 12/03/2018 in [place]  PHQ-9 Total Score 6      GAD 7 : Generalized Anxiety Score 10/07/2018  Nervous, Anxious, on Edge 1  Control/stop worrying 1  Worry too much - different things 0  Trouble relaxing 1  Restless 0  Easily annoyed or irritable 0  Afraid - awful might happen 0  Total GAD 7 Score 3  Anxiety Difficulty Somewhat difficult    Outpatient Encounter Medications as of 05/16/2020  Medication Sig  . Accu-Chek Softclix Lancets lancets Test BS BID and PRN dx.E11.9  . acetaminophen (TYLENOL) 500 MG tablet Take 1,000 mg by mouth every 6 (six) hours as needed for mild pain or headache.   . Alcohol Swabs (B-D SINGLE USE SWABS REGULAR) PADS Test BS BID and PRN dx.E11.9  . ASPIRIN LOW DOSE 81 MG EC tablet TAKE 1 TABLET EVERY DAY  . atorvastatin (LIPITOR) 20 MG tablet Take 1 tablet (20 mg total) by mouth daily.  . Blood Glucose Calibration (TRUE METRIX LEVEL 1) Low SOLN Use as needed with BS machine  . Blood Glucose Monitoring Suppl (ACCU-CHEK GUIDE) w/Device KIT Test BS BID and PRN dx.E11.9  . diclofenac Sodium (VOLTAREN) 1 % GEL Apply 2 g topically 4 (four) times daily as needed.  Marland Kitchen glucose blood (TRUE  METRIX BLOOD GLUCOSE TEST) test strip Test BS BID and PRN dx.E11.9  . hydrALAZINE (APRESOLINE) 10 MG tablet Take 1 tablet (10 mg total) by mouth 2 (two) times daily.  Marland Kitchen JANUMET XR 50-500 MG TB24 Take 1 tablet by mouth 2 (two) times daily.  Elmore Guise [employer] (TRUEDRAW LANCING DEVICE) MISC Teset BS BID Dx E11.9  . losartan-hydrochlorothiazide (HYZAAR) 50-12.5 MG tablet Take 1 tablet by mouth daily.  . metoprolol succinate (TOPROL-XL) 50 MG 24 hr tablet Take 1 tablet (50 mg total) by mouth daily. Take with or immediately following a meal.  . nitroGLYCERIN (NITROSTAT) 0.4 MG SL tablet DISSOLVE 1 TABLET UNDER THE TONGUE EVERY 5 MINUTES AS NEEDED FOR CHEST PAIN  . omeprazole (PRILOSEC) 40 MG capsule Take 1 capsule (40 mg total) by mouth daily.  . polyethylene glycol (MIRALAX / GLYCOLAX) 17 g packet Take 17 g by mouth daily as needed.  . Prodigy Twist Top Lancets 28G MISC TEST BLOOD SUGAR TWICE DAILY Dx E11.9  . TRUEplus Lancets 30G MISC Teset BS BID Dx E11.9   No facility-administered encounter medications on file as of 05/16/2020.    Goals    .  Client wants to talk with LCSW about mannaging streess issues faced (pt-stated)      Current Barriers:  . Financial challenges in client with Chronic Diagnoses of GERD, CAD, Type 2 DM, HLD, HTN . Transport challenge  Clinical Social Work Clinical Goal(s):  . Client to talk  with LCSW in next 30 days about client managing stress issues  Interventions: Talked with client about her upcoming medical appointments Talked with client about medication procurement for client Talked with client about vision of client Talked with client about CCM services Talked with client about support with RNCM for nursing needs Talked about Food Stamps benefit received by client Talked with client about family support (client has decreased family support) Talked with client about transport for client (uses [employer] bus occasionally) Talked with client about her socialization with  her friends [name] Talked with client about appetite of client Talked with client about her pain in her left hand [name] Talked with client about mood of client Talked with client  about her support from cardiologist Talked with client about ADLs completion of client   Patient Self Care Activities:  . Takes medications as prescribed . Attends scheduled medical appointments  Plan: Client to attend medical appointments LCSW to call client in next 3 weeks to discuss psychosocial needs of client Client to talk with [facility] about resources      Initial goal documentation     Follow Up Plan: LCSW to call client in next 4 weeks to discuss the psychosocial needs of client at that time  [name].Azya Barbero MSW, LCSW Licensed Clinical Social Worker [facility] Care Management [phone]

## 2020-05-16 NOTE — Patient Instructions (Addendum)
Licensed Clinical Social Worker Visit Information  Goals We Discussed Today:  .  Client wants to talk with LCSW about mannaging stress issues faced (pt-stated)        Current Barriers:   Financial challenges in client with Chronic Diagnoses of GERD, CAD, Type 2 DM, HLD, HTN  Transport challenge  Clinical Social Work Clinical Goal(s):   Client to talk with LCSW in next 30 days about client managing stress issues  Interventions: Talked with client about her upcoming medical appointments Talked with client about medication procurement for client Talked with client about vision of client Talked with client about CCM services Talked with client about support with RNCM for nursing needs Talked about Food Stamps benefitreceived by client Talked with client about family support (client has decreased family support) Talked with client about transport for client (uses [employer] bus occasionally) Talked with client about her socialization with her friends [name] Talked with client about appetite of client Talked with client about her pain in her left hand [name] Talked with client about mood of client Talked with client  about her support from cardiologist Talked with client about ADLs completion of client   Patient Self Care Activities:   Takes medications as prescribed  Attends scheduled medical appointments  Plan: Client to attend medical appointments LCSW to call client in next 3 weeks to discuss psychosocial needs of client Client to talk with [facility] about resources      Initial goal documentation     Follow Up Plan:LCSW to call client in next 4 weeks to discuss the psychosocial needs of client at that time  Materials Provided: No  The patient verbalized understanding of instructions provided today and declined a print copy of patient instruction materials.    [name].Laiken Nohr MSW, LCSW Licensed Clinical Social Worker [facility] Care Management [phone]

## 2020-05-30 ENCOUNTER — Telehealth: Payer: Self-pay

## 2020-05-30 NOTE — Telephone Encounter (Signed)
Called and notified patient of results.

## 2020-06-15 ENCOUNTER — Other Ambulatory Visit: Payer: Self-pay | Admitting: *Deleted

## 2020-06-15 MED ORDER — POLYETHYLENE GLYCOL 3350 17 G PO PACK: 17.0000 g | PACK | Freq: Every day | ORAL | 0 refills | Status: DC | PRN

## 2020-06-15 MED ORDER — BD SWAB SINGLE USE REGULAR PADS: MEDICATED_PAD | 3 refills | Status: DC

## 2020-06-15 MED ORDER — VITAMIN D3 25 MCG (1000 UNIT) PO TABS: 1000.0000 [IU] | ORAL_TABLET | Freq: Every day | ORAL | 3 refills | Status: DC

## 2020-06-16 ENCOUNTER — Ambulatory Visit (INDEPENDENT_AMBULATORY_CARE_PROVIDER_SITE_OTHER): Payer: Medicare HMO | Admitting: Licensed Clinical Social Worker

## 2020-06-16 DIAGNOSIS — E1159 Type 2 diabetes mellitus with other circulatory complications: Secondary | ICD-10-CM | POA: Diagnosis not present

## 2020-06-16 DIAGNOSIS — E785 Hyperlipidemia, unspecified: Secondary | ICD-10-CM | POA: Diagnosis not present

## 2020-06-16 DIAGNOSIS — E1122 Type 2 diabetes mellitus with diabetic chronic kidney disease: Secondary | ICD-10-CM | POA: Diagnosis not present

## 2020-06-16 DIAGNOSIS — I152 Hypertension secondary to endocrine disorders: Secondary | ICD-10-CM | POA: Diagnosis not present

## 2020-06-16 DIAGNOSIS — R42 Dizziness and giddiness: Secondary | ICD-10-CM

## 2020-06-16 DIAGNOSIS — N183 Chronic kidney disease, stage 3 unspecified: Secondary | ICD-10-CM

## 2020-06-16 DIAGNOSIS — K2100 Gastro-esophageal reflux disease with esophagitis, without bleeding: Secondary | ICD-10-CM

## 2020-06-16 DIAGNOSIS — E1169 Type 2 diabetes mellitus with other specified complication: Secondary | ICD-10-CM

## 2020-06-16 DIAGNOSIS — I2510 Atherosclerotic heart disease of native coronary artery without angina pectoris: Secondary | ICD-10-CM | POA: Diagnosis not present

## 2020-06-16 DIAGNOSIS — M7541 Impingement syndrome of right shoulder: Secondary | ICD-10-CM

## 2020-06-16 NOTE — Chronic Care Management (AMB) (Signed)
Chronic Care Management    Clinical Social Work Note  06/16/2020 Name: Tonya Carpenter MRN: [id number] DOB: 01-Dec-1945  Tonya Carpenter is a 75 y.o. year old female who is a primary care patient of [name], Fransisca Kaufmann, MD. The CCM team was consulted to assist the patient with chronic disease management and/or care coordination needs related to: [name] .   Engaged with patient by telephone for follow up visit in response to provider referral for social work chronic care management and care coordination services.   Consent to Services:  The patient was given information about Chronic Care Management services, agreed to services, and gave verbal consent prior to initiation of services.  Please see initial visit note for detailed documentation.   Patient agreed to services and consent obtained.   Assessment: Review of patient past medical history, allergies, medications, and health status, including review of relevant consultants reports was performed today as part of a comprehensive evaluation and provision of chronic care management and care coordination services.     SDOH (Social Determinants of Health) assessments and interventions performed:    Advanced Directives Status: See [employer]yn[employer] application for related entries.  CCM Care Plan  Allergies  Allergen Reactions  . Trilyte [Peg 3350-Kcl-Na Bicarb-Nacl]     Nausea/vomtiing    Outpatient Encounter Medications as of 06/16/2020  Medication Sig  . Accu-Chek Softclix Lancets lancets Test BS BID and PRN dx.E11.9  . acetaminophen (TYLENOL) 500 MG tablet Take 1,000 mg by mouth every 6 (six) hours as needed for mild pain or headache.   . Alcohol Swabs (B-D SINGLE USE SWABS REGULAR) PADS Test BS BID and PRN dx.E11.9  . ASPIRIN LOW DOSE 81 MG EC tablet TAKE 1 TABLET EVERY DAY  . atorvastatin (LIPITOR) 20 MG tablet Take 1 tablet (20 mg total) by mouth daily.  . Blood Glucose Calibration (TRUE METRIX LEVEL 1) Low SOLN Use as needed with BS  machine  . Blood Glucose Monitoring Suppl (ACCU-CHEK GUIDE) w/Device KIT Test BS BID and PRN dx.E11.9  . cholecalciferol (VITAMIN D) 25 MCG (1000 UNIT) tablet Take 1 tablet (1,000 Units total) by mouth daily.  . diclofenac Sodium (VOLTAREN) 1 % GEL Apply 2 g topically 4 (four) times daily as needed.  Marland Kitchen glucose blood (TRUE METRIX BLOOD GLUCOSE TEST) test strip Test BS BID and PRN dx.E11.9  . hydrALAZINE (APRESOLINE) 10 MG tablet Take 1 tablet (10 mg total) by mouth 2 (two) times daily.  Marland Kitchen JANUMET XR 50-500 MG TB24 Take 1 tablet by mouth 2 (two) times daily.  Elmore Guise Devices (TRUEDRAW LANCING DEVICE) MISC Teset BS BID Dx E11.9  . losartan-hydrochlorothiazide (HYZAAR) 50-12.5 MG tablet Take 1 tablet by mouth daily.  . metoprolol succinate (TOPROL-XL) 50 MG 24 hr tablet Take 1 tablet (50 mg total) by mouth daily. Take with or immediately following a meal.  . nitroGLYCERIN (NITROSTAT) 0.4 MG SL tablet DISSOLVE 1 TABLET UNDER THE TONGUE EVERY 5 MINUTES AS NEEDED FOR CHEST PAIN  . omeprazole (PRILOSEC) 40 MG capsule Take 1 capsule (40 mg total) by mouth daily.  . polyethylene glycol (MIRALAX / GLYCOLAX) 17 g packet Take 17 g by mouth daily as needed.  . Prodigy Twist Top Lancets 28G MISC TEST BLOOD SUGAR TWICE DAILY Dx E11.9  . TRUEplus Lancets 30G MISC Teset BS BID Dx E11.9   No facility-administered encounter medications on file as of 06/16/2020.    Patient Active Problem List   Diagnosis Date Noted  . CKD stage 3 due  to type 2 diabetes mellitus ([place]) 04/07/2020  . LBBB (left bundle branch block) 07/20/2019  . Dizziness 04/22/2018  . Shoulder impingement syndrome, right 12/02/2017  . GERD (gastroesophageal reflux disease) 10/20/2013  . Frequency 02/18/2013  . Coronary atherosclerosis of native coronary artery 01/14/2013  . CAD, multiple vessel 07/10/2012  . CARDIAC MURMUR 07/03/2009  . Type 2 diabetes mellitus ([place]) 09/12/2008  . Hyperlipidemia associated with type 2 diabetes mellitus ([place])  09/12/2008  . Hypertension associated with diabetes ([place]) 09/12/2008    Conditions to be addressed/monitored: Monitor anxiety and stress issues of client;   Care Plan : LCSW Care Plan  Updates made by Katha Cabal, LCSW since 06/16/2020 12:00 AM    Problem: Anxiety Identification (Anxiety)     Goal: manage anxiety and stress symptoms   Start Date: 06/16/2020  Expected End Date: 09/15/2020  This Visit's Progress: On track  Priority: Medium  Note:   Current Barriers:  . Chronic Mental Health needs related to management of anxiety and stress issues . Mobility issues . Pain Issues . Challenges in completing ADLs . Suicidal Ideation/Homicidal Ideation: No  Clinical Social Work Goal(s):  . patient will work with SW monthly by telephone or in person to reduce or manage symptoms related to anxiety and stress issues faced . patient will communicate with RNCM or LCSW as needed in next 30 days for CCM support  Interventions: . 1:1 collaboration with [name], Fransisca Kaufmann, MD regarding development and update of comprehensive plan of care as evidenced by provider attestation and co-signature . [name] . [name] . Talked with client about ADLs completion of client . [name] . Talked with client about upcoming client medical appointments . Talked with client about family support (support from son) . Talked with client about relaxation techniques (watches TV) . Talked with client about transport needs of client . Talked with client about meal provision  Patient Self Care Activities:  . Self administers medications as prescribed . Attends all scheduled provider appointments . Performs ADL's independently  Patient Coping Strengths:  . Family . Friends  Patient Self Care Deficits:  . Some pain issues . Some mobility issues  Patient Goals:  - spend time or talk with others  every day - practice relaxation or meditation daily - keep a calendar with appointment dates  Follow Up Plan: LCSW to call client on 07/21/20     [name].[place] Giammona MSW, LCSW Licensed Clinical Social Worker [facility] Care Management [phone]

## 2020-06-16 NOTE — Patient Instructions (Signed)
Visit Information  PATIENT GOALS: Goals Addressed              This Visit's Progress   .  manage anxiety and stress symptoms (pt-stated)        Timeframe:  Short-Term Goal Priority:  Medium Progress: On Track Start Date:      06/16/20                       Expected End Date:                      09/15/20 Follow Up Date 07/21/20   Protect My Health (Patient) Manage Anxiety and Stress issues faced    Why is this important?    Screening tests can find diseases early when they are easier to treat.   Your doctor or nurse will talk with you about which tests are important for you.   Getting shots for common diseases like the flu and shingles will help prevent them.      Patient Self Care Activities:  . Self administers medications as prescribed . Attends all scheduled provider appointments . Performs ADL's independently  Patient Coping Strengths:  . Family . Friends  Patient Self Care Deficits:  . Some pain issues . Some mobility issues  Patient Goals:  - spend time or talk with others every day - practice relaxation or meditation daily - keep a calendar with appointment dates  Follow Up Plan: LCSW to call client on 07/21/20       [name].Morgan Keinath MSW, LCSW Licensed Clinical Social Worker [facility] Care Management [phone]

## 2020-06-26 ENCOUNTER — Telehealth: Payer: Self-pay

## 2020-06-26 ENCOUNTER — Other Ambulatory Visit: Payer: Self-pay

## 2020-06-26 MED ORDER — ASPIRIN 81 MG PO TBEC: 81.0000 mg | DELAYED_RELEASE_TABLET | Freq: Every day | ORAL | 0 refills | Status: DC

## 2020-06-26 NOTE — Telephone Encounter (Signed)
  Prescription Request  06/26/2020  What is the name of the medication or equipment? ASPIRIN LOW DOSE 81 MG EC tablet  Have you contacted your pharmacy to request a refill? (if applicable) no  Which pharmacy would you like this sent to? [facility] mail order   Patient notified that their request is being sent to the clinical staff for review and that they should receive a response within 2 business days.

## 2020-06-27 ENCOUNTER — Ambulatory Visit (INDEPENDENT_AMBULATORY_CARE_PROVIDER_SITE_OTHER): Payer: Medicare HMO

## 2020-06-27 VITALS — Ht 63.0 in | Wt 160.0 lb

## 2020-06-27 DIAGNOSIS — Z0000 Encounter for general adult medical examination without abnormal findings: Secondary | ICD-10-CM | POA: Diagnosis not present

## 2020-06-27 NOTE — Patient Instructions (Signed)
Tonya Carpenter , Thank you for taking time to come for your Medicare Wellness Visit. I appreciate your ongoing commitment to your health goals. Please review the following plan we discussed and let me know if I can assist you in the future.   Screening recommendations/referrals: Colonoscopy: Done 03/31/2019 - Repeat in 2031 Mammogram: Done 05/10/2020 - Repeat in one year Bone Density: Done 02/25/2018 - Repeat end of 2024 Recommended yearly ophthalmology/optometry visit for glaucoma screening and checkup Recommended yearly dental visit for hygiene and checkup  Vaccinations: Influenza vaccine: Done 01/05/2020 - Repeat every year Pneumococcal vaccine: Done 05/25/2014 & 12/23/2012 Tdap vaccine: Done 09/25/2019 - Repeat in 2031 Shingles vaccine: Zostavax done 06/04/2013, first Shingrix done 02/24/2018  Covid-19: Done 05/13/2019, 06/09/2019 & 02/01/2020  Advanced directives: Advance directive discussed with you today. Even though you declined this today, please call our office should you change your mind, and we can give you the proper paperwork for you to fill out.  Conditions/risks identified: Continue healthy diabetic diet and walk every day to stay healthy.  Next appointment: Follow up in one year for your annual wellness visit    Preventive Care 65 Years and Older, Female Preventive care refers to lifestyle choices and visits with your health care provider that can promote health and wellness. What does preventive care include?  A yearly physical exam. This is also called an annual well check.  Dental exams once or twice a year.  Routine eye exams. Ask your health care provider how often you should have your eyes checked.  Personal lifestyle choices, including:  Daily care of your teeth and gums.  Regular physical activity.  Eating a healthy diet.  Avoiding tobacco and drug use.  Limiting alcohol use.  Practicing safe sex.  Taking low-dose aspirin every day.  Taking vitamin and  mineral supplements as recommended by your health care provider. What happens during an annual well check? The services and screenings done by your health care provider during your annual well check will depend on your age, overall health, lifestyle risk factors, and family history of disease. Counseling  Your health care provider may ask you questions about your:  Alcohol use.  Tobacco use.  Drug use.  Emotional well-being.  Home and relationship well-being.  Sexual activity.  Eating habits.  History of falls.  Memory and ability to understand (cognition).  Work and work Astronomer.  Reproductive health. Screening  You may have the following tests or measurements:  Height, weight, and BMI.  Blood pressure.  Lipid and cholesterol levels. These may be checked every 5 years, or more frequently if you are over 57 years old.  Skin check.  Lung cancer screening. You may have this screening every year starting at age 61 if you have a 30-pack-year history of smoking and currently smoke or have quit within the past 15 years.  Fecal occult blood test (FOBT) of the stool. You may have this test every year starting at age 70.  Flexible sigmoidoscopy or colonoscopy. You may have a sigmoidoscopy every 5 years or a colonoscopy every 10 years starting at age 50.  Hepatitis C blood test.  Hepatitis B blood test.  Sexually transmitted disease (STD) testing.  Diabetes screening. This is done by checking your blood sugar (glucose) after you have not eaten for a while (fasting). You may have this done every 1-3 years.  Bone density scan. This is done to screen for osteoporosis. You may have this done starting at age 17.  Mammogram. This may  be done every 1-2 years. Talk to your health care provider about how often you should have regular mammograms. Talk with your health care provider about your test results, treatment options, and if necessary, the need for more tests. Vaccines   Your health care provider may recommend certain vaccines, such as:  Influenza vaccine. This is recommended every year.  Tetanus, diphtheria, and acellular pertussis (Tdap, Td) vaccine. You may need a Td booster every 10 years.  Zoster vaccine. You may need this after age 24.  Pneumococcal 13-valent conjugate (PCV13) vaccine. One dose is recommended after age 66.  Pneumococcal polysaccharide (PPSV23) vaccine. One dose is recommended after age 1. Talk to your health care provider about which screenings and vaccines you need and how often you need them. This information is not intended to replace advice given to you by your health care provider. Make sure you discuss any questions you have with your health care provider. Document Released: 03/31/2015 Document Revised: 11/22/2015 Document Reviewed: 01/03/2015 Elsevier Interactive Patient Education  2017 [place] Prevention in the Home Falls can cause injuries. They can happen to people of all ages. There are many things you can do to make your home safe and to help prevent falls. What can I do on the outside of my home?  Regularly fix the edges of walkways and driveways and fix any cracks.  Remove anything that might make you trip as you walk through a door, such as a raised step or threshold.  Trim any bushes or trees on the path to your home.  Use bright outdoor lighting.  Clear any walking paths of anything that might make someone trip, such as rocks or tools.  Regularly check to see if handrails are loose or broken. Make sure that both sides of any steps have handrails.  Any raised decks and porches should have guardrails on the edges.  Have any leaves, snow, or ice cleared regularly.  Use sand or salt on walking paths during winter.  Clean up any spills in your garage right away. This includes oil or grease spills. What can I do in the bathroom?  Use night lights.  Install grab bars by the toilet and in the  tub and shower. Do not use towel bars as grab bars.  Use non-skid mats or decals in the tub or shower.  If you need to sit down in the shower, use a plastic, non-slip stool.  Keep the floor dry. Clean up any water that spills on the floor as soon as it happens.  Remove soap buildup in the tub or shower regularly.  Attach bath mats securely with double-sided non-slip rug tape.  Do not have throw rugs and other things on the floor that can make you trip. What can I do in the bedroom?  Use night lights.  Make sure that you have a light by your bed that is easy to reach.  Do not use any sheets or blankets that are too big for your bed. They should not hang down onto the floor.  Have a firm chair that has side arms. You can use this for support while you get dressed.  Do not have throw rugs and other things on the floor that can make you trip. What can I do in the kitchen?  Clean up any spills right away.  Avoid walking on wet floors.  Keep items that you use a lot in easy-to-reach places.  If you need to reach something above you,  use a strong step stool that has a grab bar.  Keep electrical cords out of the way.  Do not use floor polish or wax that makes floors slippery. If you must use wax, use non-skid floor wax.  Do not have throw rugs and other things on the floor that can make you trip. What can I do with my stairs?  Do not leave any items on the stairs.  Make sure that there are handrails on both sides of the stairs and use them. Fix handrails that are broken or loose. Make sure that handrails are as long as the stairways.  Check any carpeting to make sure that it is firmly attached to the stairs. Fix any carpet that is loose or worn.  Avoid having throw rugs at the top or bottom of the stairs. If you do have throw rugs, attach them to the floor with carpet tape.  Make sure that you have a light switch at the top of the stairs and the bottom of the stairs. If you  do not have them, ask someone to add them for you. What else can I do to help prevent falls?  Wear shoes that:  Do not have high heels.  Have rubber bottoms.  Are comfortable and fit you well.  Are closed at the toe. Do not wear sandals.  If you use a stepladder:  Make sure that it is fully opened. Do not climb a closed stepladder.  Make sure that both sides of the stepladder are locked into place.  Ask someone to hold it for you, if possible.  Clearly mark and make sure that you can see:  Any grab bars or handrails.  First and last steps.  Where the edge of each step is.  Use tools that help you move around (mobility aids) if they are needed. These include:  Canes.  Walkers.  Scooters.  Crutches.  Turn on the lights when you go into a dark area. Replace any light bulbs as soon as they burn out.  Set up your furniture so you have a clear path. Avoid moving your furniture around.  If any of your floors are uneven, fix them.  If there are any pets around you, be aware of where they are.  Review your medicines with your doctor. Some medicines can make you feel dizzy. This can increase your chance of falling. Ask your doctor what other things that you can do to help prevent falls. This information is not intended to replace advice given to you by your health care provider. Make sure you discuss any questions you have with your health care provider. Document Released: 12/29/2008 Document Revised: 08/10/2015 Document Reviewed: 04/08/2014 Elsevier Interactive Patient Education  2017 [employer].

## 2020-06-27 NOTE — Progress Notes (Signed)
Subjective:   Tonya Carpenter is a 75 y.o. female who presents for Medicare Annual (Subsequent) preventive examination.  Virtual Visit via Telephone Note  I connected with  Tonya Carpenter on 06/27/20 at 10:30 AM EDT by telephone and verified that I am speaking with the correct person using two identifiers.  Location: Patient: Home Provider: [facility] Persons participating in the virtual visit: patient/Nurse Health Advisor   I discussed the limitations, risks, security and privacy concerns of performing an evaluation and management service by telephone and the availability of in person appointments. The patient expressed understanding and agreed to proceed.  Interactive audio and video telecommunications were attempted between this nurse and patient, however failed, due to patient having technical difficulties OR patient did not have access to video capability.  We continued and completed visit with audio only.  Some vital signs may be absent or patient reported.   Perl Folmar E Lemon Sternberg, LPN   Review of Systems      Cardiac Risk Factors include: advanced age (>39mn, >>83women);diabetes mellitus;dyslipidemia;hypertension     Objective:    Today's Vitals   06/27/20 1022  Weight: 160 lb (72.6 kg)  Height: 5' 3"  (1.6 m)   Body mass index is 28.34 kg/m.  Advanced Directives 06/27/2020 09/24/2019 06/25/2019 03/31/2019 12/21/2018 02/24/2018 01/14/2017  Does Patient Have a Medical Advance Directive? No No No No No No No  Would patient like information on creating a medical advance directive? No - Patient declined No - Patient declined No - Patient declined No - Patient declined - No - Patient declined No - Patient declined    Current Medications (verified) Outpatient Encounter Medications as of 06/27/2020  Medication Sig  . Accu-Chek Softclix Lancets lancets Test BS BID and PRN dx.E11.9  . acetaminophen (TYLENOL) 500 MG tablet Take 1,000 mg by mouth every 6 (six) hours as needed for mild pain or  headache.   . Alcohol Swabs (B-D SINGLE USE SWABS REGULAR) PADS Test BS BID and PRN dx.E11.9  . aspirin (ASPIRIN LOW DOSE) 81 MG EC tablet Take 1 tablet (81 mg total) by mouth daily. Swallow whole.  .Marland Kitchenatorvastatin (LIPITOR) 20 MG tablet Take 1 tablet (20 mg total) by mouth daily.  . Blood Glucose Calibration (TRUE METRIX LEVEL 1) Low SOLN Use as needed with BS machine  . Blood Glucose Monitoring Suppl (ACCU-CHEK GUIDE) w/Device KIT Test BS BID and PRN dx.E11.9  . cholecalciferol (VITAMIN D) 25 MCG (1000 UNIT) tablet Take 1 tablet (1,000 Units total) by mouth daily.  . diclofenac Sodium (VOLTAREN) 1 % GEL Apply 2 g topically 4 (four) times daily as needed.  .Marland Kitchenglucose blood (TRUE METRIX BLOOD GLUCOSE TEST) test strip Test BS BID and PRN dx.E11.9  . hydrALAZINE (APRESOLINE) 10 MG tablet Take 1 tablet (10 mg total) by mouth 2 (two) times daily.  .Marland KitchenJANUMET XR 50-500 MG TB24 Take 1 tablet by mouth 2 (two) times daily.  .Elmore GuiseDevices (TRUEDRAW LANCING DEVICE) MISC Teset BS BID Dx E11.9  . losartan-hydrochlorothiazide (HYZAAR) 50-12.5 MG tablet Take 1 tablet by mouth daily.  . metoprolol succinate (TOPROL-XL) 50 MG 24 hr tablet Take 1 tablet (50 mg total) by mouth daily. Take with or immediately following a meal.  . omeprazole (PRILOSEC) 40 MG capsule Take 1 capsule (40 mg total) by mouth daily.  . polyethylene glycol (MIRALAX / GLYCOLAX) 17 g packet Take 17 g by mouth daily as needed.  . Prodigy Twist Top Lancets 28G MISC TEST BLOOD SUGAR TWICE  DAILY Dx E11.9  . TRUEplus Lancets 30G MISC Teset BS BID Dx E11.9  . nitroGLYCERIN (NITROSTAT) 0.4 MG SL tablet DISSOLVE 1 TABLET UNDER THE TONGUE EVERY 5 MINUTES AS NEEDED FOR CHEST PAIN (Patient not taking: Reported on 06/27/2020)   No facility-administered encounter medications on file as of 06/27/2020.    Allergies (verified) Trilyte [peg [id number] bicarb-nacl]   History: Past Medical History:  Diagnosis Date  . Anxiety   . Arthritis    "hands"  (01/14/2013)  . Asthma   . Cataract   . Coronary artery disease    Non obstructive CAD 2014 cath.   . Exertional shortness of breath   . Hyperlipidemia   . Hypertension   . Stroke [facility])    "light stroke long years ago; didn't affect me permanently" (01/14/2013)  . Type 2 diabetes mellitus ([place])    Past Surgical History:  Procedure Laterality Date  . APPENDECTOMY    . CARDIAC CATHETERIZATION  2008   no angiographically significant CAD  . CARDIOVASCULAR STRESS TEST  12/2009   no evidence for stress-induced reversibility or ischemia.  She had a fixed anterior septal wall defect, possibly related to breast attenuation and her EF was 54%.  Marland Kitchen CATARACT EXTRACTION W/PHACO Right 10/25/2014   Procedure: CATARACT EXTRACTION PHACO AND INTRAOCULAR LENS PLACEMENT; CDE:  17.28;  Surgeon: Rutherford Guys, MD;  Location: [facility] ORS;  Service: Ophthalmology;  Laterality: Right;  . CATARACT EXTRACTION W/PHACO Left 11/08/2014   Procedure: CATARACT EXTRACTION PHACO AND INTRAOCULAR LENS PLACEMENT (IOC);  Surgeon: Rutherford Guys, MD;  Location: [facility] ORS;  Service: Ophthalmology;  Laterality: Left;  CDE: 5.94  . CHOLECYSTECTOMY    . COLONOSCOPY N/A 03/31/2019   Procedure: COLONOSCOPY;  Surgeon: Danie Binder, MD;  Location: [facility] ENDO SUITE;  Service: Endoscopy;  Laterality: N/A;  10:30  . DILATION AND CURETTAGE OF UTERUS    . LEFT HEART CATHETERIZATION WITH CORONARY ANGIOGRAM N/A 01/14/2013   Procedure: LEFT HEART CATHETERIZATION WITH CORONARY ANGIOGRAM;  Surgeon: Burnell Blanks, MD;  Location: [facility] CATH LAB;  Service: Cardiovascular;  Laterality: N/A;  . TONSILLECTOMY    . TUBAL LIGATION    . VAGINAL HYSTERECTOMY     Family History  Problem Relation Age of Onset  . Hypertension Mother   . Hypertension Father   . Cancer Sister        breast  . Heart Problems Son   . Seizures Son    Social History   Socioeconomic History  . Marital status: Widowed    Spouse name: Not on file  . Number of children: 2  .  Years of education: Not on file  . Highest education level: Not on file  Occupational History  . Occupation: Disability    Employer: UNEMPLOYED  Tobacco Use  . Smoking status: Never Smoker  . Smokeless tobacco: Never Used  Substance and Sexual Activity  . Alcohol use: No  . Drug use: No  . Sexual activity: Not on file  Other Topics Concern  . Not on file  Social History Narrative   Lives in [place], [place] alone - her 2 sons live in [place] Determinants of Health   Financial Resource Strain: Not on file  Food Insecurity: Not on file  Transportation Needs: Not on file  Physical Activity: Sufficiently Active  . Days of Exercise per Week: 7 days  . Minutes of Exercise per Session: 30 min  Stress: No Stress Concern Present  . Feeling of Stress : Only a little  Social Connections: Moderately Integrated  . Frequency of Communication with Friends and Family: More than three times a week  . Frequency of Social Gatherings with Friends and Family: Once a week  . Attends Religious Services: More than 4 times per year  . Active Member of Clubs or Organizations: Yes  . Attends Archivist Meetings: More than 4 times per year  . Marital Status: Widowed    Tobacco Counseling Counseling given: Not Answered   Clinical Intake:  Pre-visit preparation completed: Yes  Pain : No/denies pain     BMI - recorded: 28.34 Nutritional Status: BMI 25 -29 Overweight Nutritional Risks: Nausea/ vomitting/ diarrhea (diarrhea, unkonwn cause) Diabetes: Yes CBG done?: No Did pt. bring in CBG monitor from home?: No  How often do you need to have someone help you when you read instructions, pamphlets, or other written materials from your doctor or pharmacy?: 1 - Never  Nutrition Risk Assessment:  Has the patient had any N/V/D within the last 2 months?  Yes  intermittent diarrhea Does the patient have any non-healing wounds?  No  Has the patient had any unintentional weight loss or  weight gain?  No   Diabetes:  Is the patient diabetic?  Yes  If diabetic, was a CBG obtained today?  No  she checked fasting this morning - 116 Did the patient bring in their glucometer from home?  No  How often do you monitor your CBG's? Twice per day.   Financial Strains and Diabetes Management:  Are you having any financial strains with the device, your supplies or your medication? No .  Does the patient want to be seen by Chronic Care Management for management of their diabetes?  No  Would the patient like to be referred to a Nutritionist or for Diabetic Management?  No   Diabetic Exams:  Diabetic Eye Exam: Completed 08/27/2019.   Diabetic Foot Exam: Completed 01/06/2020. Pt has been advised about the importance in completing this exam. Pt is scheduled for diabetic foot exam on 07/20/20.    Interpreter Needed?: No  Information entered by :: Ardie Mclennan, LPN   Activities of Daily Living In your present state of health, do you have any difficulty performing the following activities: 06/27/2020  Hearing? N  Vision? N  Difficulty concentrating or making decisions? N  Walking or climbing stairs? N  Dressing or bathing? N  Doing errands, shopping? N  Preparing Food and eating ? N  Using the Toilet? N  In the past six months, have you accidently leaked urine? Y  Do you have problems with loss of bowel control? N  Managing your Medications? N  Managing your Finances? N  Housekeeping or managing your Housekeeping? N  Some recent data might be hidden    Patient Care Team: [name], Fransisca Kaufmann, MD as PCP - General (Family Medicine) Minus Breeding, MD as PCP - Cardiology (Cardiology) Danie Binder, MD (Inactive) as Consulting Physician (Gastroenterology) Rutherford Guys, MD as Consulting Physician (Ophthalmology) Shea Evans [name], LCSW as Social Worker (Licensed Clinical Social Worker) Ilean China, RN as Case Manager  Indicate any recent [place] you may have  received from other than [facility] providers in the past year (date may be approximate).     Assessment:   This is a routine wellness examination for [facility].  Hearing/Vision screen  Hearing Screening   125Hz  250Hz  500Hz  1000Hz  2000Hz  3000Hz  4000Hz  6000Hz  8000Hz   Right ear:  Left ear:           Comments: No hearing complaints  Vision Screening Comments: No vision complaints - Annual visits with Dr Marin Comment  Dietary issues and exercise activities discussed: Current Exercise Habits: Home exercise routine, Type of exercise: walking, Time (Minutes): 30, Frequency (Times/Week): 7, Weekly Exercise (Minutes/Week): 210, Intensity: Mild  Goals    .  "I want to keep my blood sugar under control" (pt-stated)      CARE PLAN ENTRY (see longitudinal plan of care for additional care plan information)  Current Barriers:  . Chronic Disease Management support and education needs related to diabetes  in a patient with hypertension, hyperlipidemia, and CAD . Literacy barriers  Nurse Case Manager Clinical Goal(s):  Marland Kitchen Over the next 90 days, patient will keep all scheduled medical appointments . Over the next 90 days, patient will continue to monitor and log daily blood sugar readings . Over the next 45 days, patient will talk with the CCM team regarding management of her chronic medical conditions  Interventions:  . Inter-disciplinary care team collaboration (see longitudinal plan of care) . Chart reviewed including recent office notes and lab results . Reviewed and discussed upcoming appointments . Medications reviewed and discussed o Patient called [facility] requesting refill on Janumet. Advised that a script for a 90 day supply with 3 refills was sent to [facility] on 01/06/20 and a receipt confirmation was received. o Asked patient to call [facility] to see if they can fill that Rx now . Discussed home blood sugar testing o Testing 1 to 2 times daily o "blood sugars are good" . Encouraged  patient to continue testing and recording blood sugar 1 to 2 times daily and to call PCP with any readings outside of recommended range . Encouraged patient to bring glucometer to next PCP visit . Reviewed upcoming appointments . Encouraged patient to reach out to [facility] as needed  Patient Self Care Activities:  . Self administers medications as prescribed . Calls pharmacy for medication refills . Calls provider office for new concerns or questions . Unable to independently drive  Please see past updates related to this goal by clicking on the "Past Updates" button in the selected goal      .  Client wants to talk with LCSW about mannaging streess issues faced (pt-stated)      Current Barriers:  . Financial challenges in client with Chronic Diagnoses of GERD, CAD, Type 2 DM, HLD, HTN . Transport challenge  Clinical Social Work Clinical Goal(s):  . Client to talk with LCSW in next 30 days about client managing stress issues  Interventions: . Talked with client about CCM services . Talked with client about support with [facility] for nursing needs . Talked about Food Stamps benefit . Talked about [place] support  Patient Self Care Activities:  . Takes medications as prescribed . Attends scheduled medical appointments  Plan: Client to attend medical appointments LCSW to call client in next 3 weeks to discuss psychosocial needs of client Client to talk with [facility] about resources      Initial goal documentation     .  Exercise 150 minutes per week (moderate activity)    .  manage anxiety and stress symptoms (pt-stated)      Timeframe:  Short-Term Goal Priority:  Medium Progress: On Track Start Date:      06/16/20  Expected End Date:                      09/15/20 Follow Up Date 07/21/20   Protect My Health (Patient) Manage Anxiety and Stress issues faced    Why is this important?    Screening tests can find diseases early  when they are easier to treat.   Your doctor or nurse will talk with you about which tests are important for you.   Getting shots for common diseases like the flu and shingles will help prevent them.      Patient Self Care Activities:  . Self administers medications as prescribed . Attends all scheduled provider appointments . Performs ADL's independently  Patient Coping Strengths:  . Family . Friends  Patient Self Care Deficits:  . Some pain issues . Some mobility issues  Patient Goals:  - spend time or talk with others every day - practice relaxation or meditation daily - keep a calendar with appointment dates  Follow Up Plan: LCSW to call client on 07/21/20       Depression Screen PHQ 2/9 Scores 06/27/2020 04/07/2020 01/06/2020 06/25/2019 06/09/2019 12/31/2018 12/09/2018  PHQ - 2 Score 0 0 0 0 0 1 0  PHQ- 9 Score - - - - - - -  Exception Documentation - - - - - - -  Not completed - - - - - - -    Fall Risk Fall Risk  06/27/2020 04/07/2020 01/06/2020 10/01/2019 06/25/2019  Falls in the past year? 0 0 1 0 0  Number falls in past yr: 0 - - 1 -  Injury with Fall? 0 - 1 1 -  Risk Factor Category  - - - - -  Risk for fall due to : No Fall Risks - Impaired balance/gait Impaired balance/gait -  Follow up Falls prevention discussed - Falls evaluation completed Education provided -    FALL RISK PREVENTION PERTAINING TO THE HOME:  Any stairs in or around the home? No  If so, are there any without handrails? No  Home free of loose throw rugs in walkways, pet beds, electrical cords, etc? Yes  Adequate lighting in your home to reduce risk of falls? Yes   ASSISTIVE DEVICES UTILIZED TO PREVENT FALLS:  Life alert? No  Use of a cane, walker or w/c? Yes  Grab bars in the bathroom? No  Shower chair or bench in shower? No  Elevated toilet seat or a handicapped toilet? No   TIMED UP AND GO:  Was the test performed? No . Telephonic visit  Cognitive Function: Cognitive status assessed  by direct observation. Patient is unable to complete screening 6CIT or MMSE. (I couldn't get her to answer any of the questions - she seems cognitively impaired to me)   MMSE - Mini Mental State Exam 01/14/2017 01/10/2016  Orientation to time 3 5  Orientation to Place 4 5  Registration 3 3  Attention/ Calculation 0 -  Recall 2 0  Language- name 2 objects 2 2  Language- repeat 0 1  Language- follow 3 step command 3 3  Language- read & follow direction 1 1  Write a sentence 1 1  Copy design 0 1  Total score 19 -     6CIT Screen 06/25/2019 02/24/2018  What Year? 0 points 0 points  What month? 0 points 0 points  What time? 0 points 0 points  Count back from 20 4 points 4 points  Months in reverse 4 points  4 points  Repeat phrase 10 points 2 points  Total Score 18 -    Immunizations Immunization History  Administered Date(s) Administered  . Fluad Quad(high Dose 65+) 01/05/2020  . Influenza, High Dose Seasonal PF 12/22/2017  . Influenza,inj,Quad PF,6+ Mos 12/18/2012, 02/02/2014, 01/03/2015, 01/10/2016, 01/13/2017  . Influenza,inj,quad, With Preservative 12/22/2018  . Influenza-Unspecified 01/03/2020  . Moderna Sars-Covid-2 Vaccination 05/13/2019, 06/09/2019, 02/01/2020  . Pneumococcal Conjugate-13 12/23/2012  . Pneumococcal Polysaccharide-23 05/25/2014  . Tdap 07/19/2013, 09/25/2019  . Zoster 06/04/2013  . Zoster Recombinat (Shingrix) 02/24/2018    TDAP status: Up to date  Flu Vaccine status: Up to date  Pneumococcal vaccine status: Up to date  Covid-19 vaccine status: Completed vaccines  Qualifies for Shingles Vaccine? Yes   Zostavax completed Yes   Shingrix Completed?: No.    Education has been provided regarding the importance of this vaccine. Patient has been advised to call insurance company to determine out of pocket expense if they have not yet received this vaccine. Advised may also receive vaccine at local pharmacy or Health Dept. Verbalized acceptance and  understanding.  Screening Tests Health Maintenance  Topic Date Due  . OPHTHALMOLOGY EXAM  07/06/2020 (Originally 03/25/2018)  . HEMOGLOBIN A1C  10/05/2020  . INFLUENZA VACCINE  10/16/2020  . FOOT EXAM  01/05/2021  . MAMMOGRAM  05/10/2021  . DEXA SCAN  02/26/2023  . COLONOSCOPY (Pts 45-108yr Insurance coverage will need to be confirmed)  03/30/2029  . TETANUS/TDAP  09/24/2029  . COVID-19 Vaccine  Completed  . Hepatitis C Screening  Completed  . PNA vac Low Risk Adult  Completed  . HPV VACCINES  Aged Out    Health Maintenance  There are no preventive care reminders to display for this patient.  Colorectal cancer screening: Type of screening: Colonoscopy. Completed 03/31/2019. Repeat every 10 years  Mammogram status: Completed 05/10/2020. Repeat every year  Bone Density status: Completed 02/25/2018. Results reflect: Bone density results: NORMAL. Repeat every 5 years.  Lung Cancer Screening: (Low Dose CT Chest recommended if Age 74-80years, 30 pack-year currently smoking OR have quit w/in 15years.) does not qualify.   Additional Screening:  Hepatitis C Screening: does qualify; Completed 01/10/2016  Vision Screening: Recommended annual ophthalmology exams for early detection of glaucoma and other disorders of the eye. Is the patient up to date with their annual eye exam?  Yes  Who is the provider or what is the name of the office in which the patient attends annual eye exams? [place] pt is not established with a provider, would they like to be referred to a provider to establish care? No .   Dental Screening: Recommended annual dental exams for proper oral hygiene  Community Resource Referral / Chronic Care Management: CRR required this visit?  No   CCM required this visit?  No      Plan:     I have personally reviewed and noted the following in the patient's chart:   . Medical and social history . Use of alcohol, tobacco or illicit drugs  . Current  medications and supplements . Functional ability and status . Nutritional status . Physical activity . Advanced directives . List of other physicians . Hospitalizations, surgeries, and ER visits in previous 12 months . Vitals . Screenings to include cognitive, depression, and falls . Referrals and appointments  In addition, I have reviewed and discussed with patient certain preventive protocols, quality metrics, and best practice recommendations. A written personalized care plan for preventive services as  well as general preventive health recommendations were provided to patient.     Sandrea Hammond, LPN   10/04/9104   Nurse Notes: None

## 2020-07-06 ENCOUNTER — Ambulatory Visit: Payer: Medicare HMO | Admitting: Family Medicine

## 2020-07-20 ENCOUNTER — Ambulatory Visit: Payer: Medicare HMO | Admitting: Family Medicine

## 2020-07-21 ENCOUNTER — Ambulatory Visit (INDEPENDENT_AMBULATORY_CARE_PROVIDER_SITE_OTHER): Payer: Medicare HMO | Admitting: Licensed Clinical Social Worker

## 2020-07-21 ENCOUNTER — Other Ambulatory Visit: Payer: Self-pay

## 2020-07-21 ENCOUNTER — Encounter: Payer: Self-pay | Admitting: Family Medicine

## 2020-07-21 ENCOUNTER — Telehealth: Payer: Self-pay

## 2020-07-21 ENCOUNTER — Ambulatory Visit (INDEPENDENT_AMBULATORY_CARE_PROVIDER_SITE_OTHER): Payer: Medicare HMO | Admitting: Family Medicine

## 2020-07-21 VITALS — BP 111/66 | HR 70 | Ht 63.0 in

## 2020-07-21 DIAGNOSIS — I2510 Atherosclerotic heart disease of native coronary artery without angina pectoris: Secondary | ICD-10-CM | POA: Diagnosis not present

## 2020-07-21 DIAGNOSIS — I152 Hypertension secondary to endocrine disorders: Secondary | ICD-10-CM

## 2020-07-21 DIAGNOSIS — E1122 Type 2 diabetes mellitus with diabetic chronic kidney disease: Secondary | ICD-10-CM | POA: Diagnosis not present

## 2020-07-21 DIAGNOSIS — M7541 Impingement syndrome of right shoulder: Secondary | ICD-10-CM

## 2020-07-21 DIAGNOSIS — E785 Hyperlipidemia, unspecified: Secondary | ICD-10-CM

## 2020-07-21 DIAGNOSIS — E1159 Type 2 diabetes mellitus with other circulatory complications: Secondary | ICD-10-CM

## 2020-07-21 DIAGNOSIS — N183 Chronic kidney disease, stage 3 unspecified: Secondary | ICD-10-CM | POA: Diagnosis not present

## 2020-07-21 DIAGNOSIS — E1169 Type 2 diabetes mellitus with other specified complication: Secondary | ICD-10-CM

## 2020-07-21 DIAGNOSIS — K2100 Gastro-esophageal reflux disease with esophagitis, without bleeding: Secondary | ICD-10-CM

## 2020-07-21 DIAGNOSIS — R413 Other amnesia: Secondary | ICD-10-CM | POA: Diagnosis not present

## 2020-07-21 DIAGNOSIS — R42 Dizziness and giddiness: Secondary | ICD-10-CM

## 2020-07-21 LAB — BAYER DCA HB A1C WAIVED: HB A1C (BAYER DCA - WAIVED): 7.1 % — ABNORMAL HIGH (ref <7.0)

## 2020-07-21 MED ORDER — ACCU-CHEK GUIDE W/DEVICE KIT: PACK | 0 refills | Status: DC

## 2020-07-21 NOTE — Progress Notes (Addendum)
BP 111/66   Pulse 70   Ht 5' 3" (1.6 m)   SpO2 100%   BMI 28.34 kg/m    Subjective:   Patient ID: Tonya Carpenter, female    DOB: 06/14/1945, 75 y.o.   MRN: [id number]  HPI: Tonya Carpenter is a 75 y.o. female presenting on 07/21/2020 for Medical Management of Chronic Issues and Diabetes   HPI Type 2 diabetes mellitus Patient comes in today for recheck of his diabetes. Patient has been currently taking Janumet. Patient is currently on an ACE inhibitor/ARB. Patient has not seen an ophthalmologist this year. Patient denies any issues with their feet. The symptom started onset as an adult hypertension hyperlipidemia and CKD and CAD ARE RELATED TO DM   Hypertension Patient is currently on hydralazine and losartan hydrochlorothiazide and metoprolol, and their blood pressure today is 111/66. Patient denies any lightheadedness or dizziness. Patient denies headaches, blurred vision, chest pains, shortness of breath, or weakness. Denies any side effects from medication and is content with current medication.   Hyperlipidemia Patient is coming in for recheck of his hyperlipidemia. The patient is currently taking atorvastatin. They deny any issues with myalgias or history of liver damage from it. They deny any focal numbness or weakness or chest pain.   Relevant past medical, surgical, family and social history reviewed and updated as indicated. Interim medical history since our last visit reviewed. Allergies and medications reviewed and updated.  Review of Systems  Constitutional: Negative for chills and fever.  HENT: Negative for congestion, ear discharge and ear pain.   Eyes: Negative for redness and visual disturbance.  Respiratory: Negative for chest tightness and shortness of breath.   Cardiovascular: Negative for chest pain and leg swelling.  Genitourinary: Negative for difficulty urinating and dysuria.  Musculoskeletal: Positive for arthralgias (Occasional arthralgias especially right hip  sometimes.). Negative for back pain and gait problem.  Skin: Negative for rash.  Neurological: Negative for dizziness, light-headedness and headaches.  Psychiatric/Behavioral: Negative for agitation and behavioral problems.  All other systems reviewed and are negative.   Per HPI unless specifically indicated above   Allergies as of 07/21/2020      Reactions   Trilyte [peg 3350-kcl-na Bicarb-nacl]    Nausea/vomtiing      Medication List       Accurate as of Jul 21, 2020  9:18 AM. If you have any questions, ask your nurse or doctor.        Accu-Chek Guide w/Device Kit Test BS BID and PRN dx.E11.9   acetaminophen 500 MG tablet Commonly known as: TYLENOL Take 1,000 mg by mouth every 6 (six) hours as needed for mild pain or headache.   aspirin 81 MG EC tablet Commonly known as: Aspirin Low Dose Take 1 tablet (81 mg total) by mouth daily. Swallow whole.   atorvastatin 20 MG tablet Commonly known as: LIPITOR Take 1 tablet (20 mg total) by mouth daily.   B-D SINGLE USE SWABS REGULAR Pads Test BS BID and PRN dx.E11.9   cholecalciferol 25 MCG (1000 UNIT) tablet Commonly known as: VITAMIN D Take 1 tablet (1,000 Units total) by mouth daily.   diclofenac Sodium 1 % Gel Commonly known as: VOLTAREN Apply 2 g topically 4 (four) times daily as needed.   hydrALAZINE 10 MG tablet Commonly known as: APRESOLINE Take 1 tablet (10 mg total) by mouth 2 (two) times daily.   Janumet XR 50-500 MG Tb24 Generic drug: SitaGLIPtin-MetFORMIN HCl Take 1 tablet by mouth 2 (two) times  daily.   losartan-hydrochlorothiazide 50-12.5 MG tablet Commonly known as: HYZAAR Take 1 tablet by mouth daily.   metoprolol succinate 50 MG 24 hr tablet Commonly known as: TOPROL-XL Take 1 tablet (50 mg total) by mouth daily. Take with or immediately following a meal.   nitroGLYCERIN 0.4 MG SL tablet Commonly known as: NITROSTAT DISSOLVE 1 TABLET UNDER THE TONGUE EVERY 5 MINUTES AS NEEDED FOR CHEST PAIN    omeprazole 40 MG capsule Commonly known as: PRILOSEC Take 1 capsule (40 mg total) by mouth daily.   polyethylene glycol 17 g packet Commonly known as: MIRALAX / GLYCOLAX Take 17 g by mouth daily as needed.   True Metrix Blood Glucose Test test strip Generic drug: glucose blood Test BS BID and PRN dx.E11.9   True Metrix Level 1 Low Soln Use as needed with BS machine   TRUEdraw Lancing Device Misc Teset BS BID Dx E11.9   TRUEplus Lancets 30G Misc Teset BS BID Dx E11.9   Accu-Chek Softclix Lancets lancets Test BS BID and PRN dx.E11.9   Prodigy Twist Top Lancets 28G Misc TEST BLOOD SUGAR TWICE DAILY Dx E11.9        Objective:   BP 111/66   Pulse 70   Ht 5' 3" (1.6 m)   SpO2 100%   BMI 28.34 kg/m   Wt Readings from Last 3 Encounters:  06/27/20 160 lb (72.6 kg)  04/07/20 160 lb (72.6 kg)  01/06/20 156 lb (70.8 kg)    Physical Exam Vitals and nursing note reviewed.  Constitutional:      General: She is not in acute distress.    Appearance: She is well-developed. She is not diaphoretic.  Eyes:     Conjunctiva/sclera: Conjunctivae normal.  Cardiovascular:     Rate and Rhythm: Normal rate and regular rhythm.     Heart sounds: Normal heart sounds. No murmur heard.   Pulmonary:     Effort: Pulmonary effort is normal. No respiratory distress.     Breath sounds: Normal breath sounds. No wheezing.  Musculoskeletal:        General: No tenderness. Normal range of motion.  Skin:    General: Skin is warm and dry.     Findings: No rash.  Neurological:     Mental Status: She is alert and oriented to person, place, and time.     Coordination: Coordination normal.  Psychiatric:        Behavior: Behavior normal.       Assessment & Plan:   Problem List Items Addressed This Visit      Cardiovascular and Mediastinum   Hypertension associated with diabetes ([place])   Relevant Orders   CBC with Differential/Platelet (Completed)   CMP14+EGFR (Completed)   Lipid  panel (Completed)   Bayer DCA Hb A1c Waived (Completed)     Endocrine   Type 2 diabetes mellitus ([place]) - Primary   Relevant Orders   CBC with Differential/Platelet (Completed)   CMP14+EGFR (Completed)   Lipid panel (Completed)   Bayer DCA Hb A1c Waived (Completed)   Hyperlipidemia associated with type 2 diabetes mellitus ([place])   Relevant Orders   CBC with Differential/Platelet (Completed)   CMP14+EGFR (Completed)   Lipid panel (Completed)   Bayer DCA Hb A1c Waived (Completed)   CKD stage 3 due to type 2 diabetes mellitus ([place])    Other Visit Diagnoses    Memory changes       Relevant Orders   Ambulatory referral to [place]  A1c 7.1, no change in medication, seems to be stable.  Patient feels like after the fact she mentions that she is having a lot of memory issues and feels like she can use some help around the home.  Patient has a lot of educational deficits as well that make it difficult to fully assess the memory. Follow up plan: Return in about 3 months (around 10/21/2020), or if symptoms worsen or fail to improve, for Diabetes and hypertension and cholesterol recheck.  Counseling provided for all of the vaccine components Orders Placed This Encounter  Procedures  . CBC with Differential/Platelet  . CMP14+EGFR  . Lipid panel  . Bayer [facility] Hb A1c [place], MD [facility] 07/21/2020, 9:18 AM

## 2020-07-21 NOTE — Telephone Encounter (Signed)
Pt states that Dr. Louanne Skye said to let him know if she wanted Home Health. Pt would like for someone to come to her home for a couple of hours per day. States that it is hard for her to bend and she is becoming more forgetful.

## 2020-07-21 NOTE — Chronic Care Management (AMB) (Signed)
Chronic Care Management    Clinical Social Work Note  07/21/2020 Name: Tonya Carpenter MRN: [id number] DOB: 06/22/1945  Tonya Carpenter is a 75 y.o. year old female who is a primary care patient of [name], Fransisca Kaufmann, MD. The CCM team was consulted to assist the patient with chronic disease management and/or care coordination needs related to: [name] .   Engaged with patient by telephone for follow up visit in response to provider referral for social work chronic care management and care coordination services.   Consent to Services:  The patient was given information about Chronic Care Management services, agreed to services, and gave verbal consent prior to initiation of services.  Please see initial visit note for detailed documentation.   Patient agreed to services and consent obtained.   Assessment: Review of patient past medical history, allergies, medications, and health status, including review of relevant consultants reports was performed today as part of a comprehensive evaluation and provision of chronic care management and care coordination services.     SDOH (Social Determinants of Health) assessments and interventions performed:  SDOH Interventions   Flowsheet Row Most Recent Value  SDOH Interventions   Depression Interventions/Treatment  Counseling       Advanced Directives Status: See Vynca application for related entries.  CCM Care Plan  Allergies  Allergen Reactions  . Trilyte [Peg 3350-Kcl-Na Bicarb-Nacl]     Nausea/vomtiing    Outpatient Encounter Medications as of 07/21/2020  Medication Sig  . Accu-Chek Softclix Lancets lancets Test BS BID and PRN dx.E11.9  . acetaminophen (TYLENOL) 500 MG tablet Take 1,000 mg by mouth every 6 (six) hours as needed for mild pain or headache.   . Alcohol Swabs (B-D SINGLE USE SWABS REGULAR) PADS Test BS BID and PRN dx.E11.9  . aspirin (ASPIRIN LOW DOSE) 81 MG EC tablet Take 1 tablet (81 mg total) by mouth daily. Swallow  whole.  Marland Kitchen atorvastatin (LIPITOR) 20 MG tablet Take 1 tablet (20 mg total) by mouth daily.  . Blood Glucose Calibration (TRUE METRIX LEVEL 1) Low SOLN Use as needed with BS machine  . Blood Glucose Monitoring Suppl (ACCU-CHEK GUIDE) w/Device KIT Test BS BID and PRN dx.E11.9  . cholecalciferol (VITAMIN D) 25 MCG (1000 UNIT) tablet Take 1 tablet (1,000 Units total) by mouth daily.  . diclofenac Sodium (VOLTAREN) 1 % GEL Apply 2 g topically 4 (four) times daily as needed.  Marland Kitchen glucose blood (TRUE METRIX BLOOD GLUCOSE TEST) test strip Test BS BID and PRN dx.E11.9  . hydrALAZINE (APRESOLINE) 10 MG tablet Take 1 tablet (10 mg total) by mouth 2 (two) times daily.  Marland Kitchen JANUMET XR 50-500 MG TB24 Take 1 tablet by mouth 2 (two) times daily.  Elmore Guise Devices (TRUEDRAW LANCING DEVICE) MISC Teset BS BID Dx E11.9  . losartan-hydrochlorothiazide (HYZAAR) 50-12.5 MG tablet Take 1 tablet by mouth daily.  . metoprolol succinate (TOPROL-XL) 50 MG 24 hr tablet Take 1 tablet (50 mg total) by mouth daily. Take with or immediately following a meal.  . nitroGLYCERIN (NITROSTAT) 0.4 MG SL tablet DISSOLVE 1 TABLET UNDER THE TONGUE EVERY 5 MINUTES AS NEEDED FOR CHEST PAIN  . omeprazole (PRILOSEC) 40 MG capsule Take 1 capsule (40 mg total) by mouth daily.  . polyethylene glycol (MIRALAX / GLYCOLAX) 17 g packet Take 17 g by mouth daily as needed.  . Prodigy Twist Top Lancets 28G MISC TEST BLOOD SUGAR TWICE DAILY Dx E11.9  . TRUEplus Lancets 30G MISC Teset BS BID Dx E11.9  . [  DISCONTINUED] Blood Glucose Monitoring Suppl (ACCU-CHEK GUIDE) w/Device KIT Test BS BID and PRN dx.E11.9   No facility-administered encounter medications on file as of 07/21/2020.    Patient Active Problem List   Diagnosis Date Noted  . CKD stage 3 due to type 2 diabetes mellitus ([place]) 04/07/2020  . LBBB (left bundle branch block) 07/20/2019  . Dizziness 04/22/2018  . Shoulder impingement syndrome, right 12/02/2017  . GERD (gastroesophageal reflux  disease) 10/20/2013  . Frequency 02/18/2013  . Coronary atherosclerosis of native coronary artery 01/14/2013  . CAD, multiple vessel 07/10/2012  . CARDIAC MURMUR 07/03/2009  . Type 2 diabetes mellitus ([place]) 09/12/2008  . Hyperlipidemia associated with type 2 diabetes mellitus ([place]) 09/12/2008  . Hypertension associated with diabetes ([place]) 09/12/2008    Conditions to be addressed/monitored: Monitor client management of anxiety and stress issues faced   Care Plan : LCSW Care Plan  Updates made by Katha Cabal, LCSW since 07/21/2020 12:00 AM    Problem: Anxiety Identification (Anxiety)     Goal: manage anxiety and stress symptoms   Start Date: 06/16/2020  Expected End Date: 09/15/2020  This Visit's Progress: On track  Recent Progress: On track  Priority: Medium  Note:   Current Barriers:  . Chronic Mental Health needs related to management of anxiety and stress issues . Mobility issues . Pain Issues . Challenges in completing ADLs . Suicidal Ideation/Homicidal Ideation: No  Clinical Social Work Goal(s):  . patient will work with SW monthly by telephone or in person to reduce or manage symptoms related to anxiety and stress issues faced . patient will communicate with RNCM or LCSW as needed in next 30 days for CCM support  Interventions: . 1:1 collaboration with [name], Fransisca Kaufmann, MD regarding development and update of comprehensive plan of care as evidenced by provider attestation and co-signature . [name] . [name] . [name] . Talked with client about upcoming client medical appointments . Talked with client about family support (support from son, support from nephew) . Talked with client about medication procurement for client . Encouraged client to call RNCM as needed for CCM nursing support  Patient Self Care Activities:  . Self administers medications as  prescribed . Attends all scheduled provider appointments . Performs ADL's independently  Patient Coping Strengths:  . Family . Friends  Patient Self Care Deficits:  . Some pain issues . Some mobility issues  Patient Goals:  - spend time or talk with others every day - practice relaxation or meditation daily - keep a calendar with appointment dates  Follow Up Plan: LCSW to call client on 08/31/20     [name].Deette Revak MSW, LCSW Licensed Clinical Social Worker [facility] Care Management [phone]

## 2020-07-21 NOTE — Patient Instructions (Signed)
Visit Information  PATIENT GOALS: Goals Addressed              This Visit's Progress   .  manage anxiety and stress symptoms (pt-stated)        Timeframe:  Short-Term Goal Priority:  Medium Progress: On Track Start Date:      06/16/20                       Expected End Date:                      09/15/20  Follow Up Date 08/31/20   Protect My Health (Patient) Manage Anxiety and Stress issues faced    Why is this important?    Screening tests can find diseases early when they are easier to treat.   Your doctor or nurse will talk with you about which tests are important for you.   Getting shots for common diseases like the flu and shingles will help prevent them.      Patient Self Care Activities:  . Self administers medications as prescribed . Attends all scheduled provider appointments . Performs ADL's independently  Patient Coping Strengths:  . Family . Friends  Patient Self Care Deficits:  . Some pain issues . Some mobility issues  Patient Goals:  - spend time or talk with others every day - practice relaxation or meditation daily - keep a calendar with appointment dates  Follow Up Plan: LCSW to call client on 08/31/20       Tonya Carpenter.Tonya Carpenter MSW, LCSW Licensed Clinical Social Worker [facility] Care Management [phone]

## 2020-07-22 LAB — CMP14+EGFR
ALT: 12 IU/L (ref 0–32)
AST: 13 IU/L (ref 0–40)
Albumin/Globulin Ratio: 1.2 (ref 1.2–2.2)
Albumin: 4.3 g/dL (ref 3.7–4.7)
Alkaline Phosphatase: 58 IU/L (ref 44–121)
BUN/Creatinine Ratio: 16 (ref 12–28)
BUN: 22 mg/dL (ref 8–27)
Bilirubin Total: 0.4 mg/dL (ref 0.0–1.2)
CO2: 24 mmol/L (ref 20–29)
Calcium: 9.8 mg/dL (ref 8.7–10.3)
Chloride: 100 mmol/L (ref 96–106)
Creatinine, Ser: 1.36 mg/dL — ABNORMAL HIGH (ref 0.57–1.00)
Globulin, Total: 3.7 g/dL (ref 1.5–4.5)
Glucose: 135 mg/dL — ABNORMAL HIGH (ref 65–99)
Potassium: 4.1 mmol/L (ref 3.5–5.2)
Sodium: 142 mmol/L (ref 134–144)
Total Protein: 8 g/dL (ref 6.0–8.5)
eGFR: 41 mL/min/{1.73_m2} — ABNORMAL LOW (ref >59)

## 2020-07-22 LAB — CBC WITH DIFFERENTIAL/PLATELET
Basophils Absolute: 0 10*3/uL (ref 0.0–0.2)
Basos: 0 %
EOS (ABSOLUTE): 0 10*3/uL (ref 0.0–0.4)
Eos: 0 %
Hematocrit: 35.5 % (ref 34.0–46.6)
Hemoglobin: 11.1 g/dL (ref 11.1–15.9)
Immature Grans (Abs): 0 10*3/uL (ref 0.0–0.1)
Immature Granulocytes: 0 %
Lymphocytes Absolute: 2.9 10*3/uL (ref 0.7–3.1)
Lymphs: 23 %
MCH: 26.6 pg (ref 26.6–33.0)
MCHC: 31.3 g/dL — ABNORMAL LOW (ref 31.5–35.7)
MCV: 85 fL (ref 79–97)
Monocytes Absolute: 0.9 10*3/uL (ref 0.1–0.9)
Monocytes: 7 %
Neutrophils Absolute: 8.5 10*3/uL — ABNORMAL HIGH (ref 1.4–7.0)
Neutrophils: 70 %
Platelets: 261 10*3/uL (ref 150–450)
RBC: 4.17 x10E6/uL (ref 3.77–5.28)
RDW: 12.4 % (ref 11.7–15.4)
WBC: 12.4 10*3/uL — ABNORMAL HIGH (ref 3.4–10.8)

## 2020-07-22 LAB — LIPID PANEL
Chol/HDL Ratio: 2.2 ratio (ref 0.0–4.4)
Cholesterol, Total: 134 mg/dL (ref 100–199)
HDL: 62 mg/dL (ref >39)
LDL Chol Calc (NIH): 57 mg/dL (ref 0–99)
Triglycerides: 77 mg/dL (ref 0–149)
VLDL Cholesterol Cal: 15 mg/dL (ref 5–40)

## 2020-07-24 NOTE — Telephone Encounter (Signed)
Placed referral for home health again, I cannot promise that they are going to give any services but they may come out and assess

## 2020-07-24 NOTE — Addendum Note (Signed)
Addended by: [place] [facility] on: 07/24/2020 08:14 AM   Modules accepted: Orders

## 2020-08-03 ENCOUNTER — Telehealth: Payer: Self-pay | Admitting: Family Medicine

## 2020-08-03 DIAGNOSIS — H2513 Age-related nuclear cataract, bilateral: Secondary | ICD-10-CM | POA: Diagnosis not present

## 2020-08-03 DIAGNOSIS — H40033 Anatomical narrow angle, bilateral: Secondary | ICD-10-CM | POA: Diagnosis not present

## 2020-08-03 NOTE — Telephone Encounter (Signed)
Patient does not qualify for home health.  Per HH this is more for personal care( cooking, cleaning) that she would have to hire on her own.  Pt made aware.

## 2020-08-04 ENCOUNTER — Other Ambulatory Visit: Payer: Self-pay | Admitting: Family Medicine

## 2020-08-25 ENCOUNTER — Other Ambulatory Visit: Payer: Self-pay | Admitting: Family Medicine

## 2020-08-30 ENCOUNTER — Telehealth: Payer: Self-pay | Admitting: Cardiology

## 2020-08-30 NOTE — Telephone Encounter (Signed)
Returned call to patient-patient states she is nervous about not having a follow up appt until next week.  She states he husband passed away a year ago and she just gets very anxious/nervous.   She denies any current symptoms or issues.  Advised if she is more comfortable following up this year, we will be glad to get her scheduled.  She states she would like to do that but will call back to schedule and appointment.    Patient appreciated the call.   OV 07/21/2019 with Dr. Antoine Poche, due for follow up in 2023.

## 2020-08-30 NOTE — Telephone Encounter (Signed)
New message:     Patient calling stating that she has been really nerves concering death and she feels as if she need to speak with some one. Patient husband passed away about a year ago. Patient also has a funeral to attend tomorrow. Please advise.

## 2020-08-31 ENCOUNTER — Telehealth: Payer: Medicare HMO

## 2020-09-06 ENCOUNTER — Other Ambulatory Visit: Payer: Self-pay | Admitting: Family Medicine

## 2020-09-06 DIAGNOSIS — E1122 Type 2 diabetes mellitus with diabetic chronic kidney disease: Secondary | ICD-10-CM

## 2020-09-21 ENCOUNTER — Telehealth: Payer: Medicare HMO

## 2020-09-28 ENCOUNTER — Ambulatory Visit (INDEPENDENT_AMBULATORY_CARE_PROVIDER_SITE_OTHER): Payer: Medicare HMO | Admitting: Licensed Clinical Social Worker

## 2020-09-28 DIAGNOSIS — I152 Hypertension secondary to endocrine disorders: Secondary | ICD-10-CM

## 2020-09-28 DIAGNOSIS — M7541 Impingement syndrome of right shoulder: Secondary | ICD-10-CM

## 2020-09-28 DIAGNOSIS — E1169 Type 2 diabetes mellitus with other specified complication: Secondary | ICD-10-CM

## 2020-09-28 DIAGNOSIS — E785 Hyperlipidemia, unspecified: Secondary | ICD-10-CM | POA: Diagnosis not present

## 2020-09-28 DIAGNOSIS — R42 Dizziness and giddiness: Secondary | ICD-10-CM

## 2020-09-28 DIAGNOSIS — N183 Chronic kidney disease, stage 3 unspecified: Secondary | ICD-10-CM | POA: Diagnosis not present

## 2020-09-28 DIAGNOSIS — K2100 Gastro-esophageal reflux disease with esophagitis, without bleeding: Secondary | ICD-10-CM

## 2020-09-28 DIAGNOSIS — E1159 Type 2 diabetes mellitus with other circulatory complications: Secondary | ICD-10-CM | POA: Diagnosis not present

## 2020-09-28 DIAGNOSIS — E1122 Type 2 diabetes mellitus with diabetic chronic kidney disease: Secondary | ICD-10-CM | POA: Diagnosis not present

## 2020-09-28 DIAGNOSIS — I2510 Atherosclerotic heart disease of native coronary artery without angina pectoris: Secondary | ICD-10-CM | POA: Diagnosis not present

## 2020-09-28 NOTE — Chronic Care Management (AMB) (Signed)
Chronic Care Management    Clinical Social Work Note  09/28/2020 Name: Tonya Carpenter MRN: [id number] DOB: 03-11-1946  Tonya Carpenter is a 75 y.o. year old female who is a primary care patient of [name], Fransisca Kaufmann, MD. The CCM team was consulted to assist the patient with chronic disease management and/or care coordination needs related to: [name] .   Engaged with patient by telephone for follow up visit in response to provider referral for social work chronic care management and care coordination services.   Consent to Services:  The patient was given information about Chronic Care Management services, agreed to services, and gave verbal consent prior to initiation of services.  Please see initial visit note for detailed documentation.   Patient agreed to services and consent obtained.   Assessment: Review of patient past medical history, allergies, medications, and health status, including review of relevant consultants reports was performed today as part of a comprehensive evaluation and provision of chronic care management and care coordination services.     SDOH (Social Determinants of Health) assessments and interventions performed:  SDOH Interventions    Flowsheet Row Most Recent Value  SDOH Interventions   Depression Interventions/Treatment  Counseling        Advanced Directives Status: See Vynca application for related entries.  CCM Care Plan  Allergies  Allergen Reactions   Trilyte [Peg 3350-Kcl-Na Bicarb-Nacl]     Nausea/vomtiing    Outpatient Encounter Medications as of 09/28/2020  Medication Sig   Accu-Chek Softclix Lancets lancets Test BS BID and PRN dx.E11.9   acetaminophen (TYLENOL) 500 MG tablet Take 1,000 mg by mouth every 6 (six) hours as needed for mild pain or headache.    Alcohol Swabs (B-D SINGLE USE SWABS REGULAR) PADS Test BS BID and PRN dx.E11.9   aspirin (ASPIRIN LOW DOSE) 81 MG EC tablet Take 1 tablet (81 mg total) by mouth daily. Swallow  whole.   atorvastatin (LIPITOR) 20 MG tablet Take 1 tablet (20 mg total) by mouth daily.   Blood Glucose Calibration (TRUE METRIX LEVEL 1) Low SOLN Use as needed with BS machine   Blood Glucose Monitoring Suppl (ACCU-CHEK GUIDE) w/Device KIT Test BS BID and PRN dx.E11.9   cholecalciferol (VITAMIN D) 25 MCG (1000 UNIT) tablet Take 1 tablet (1,000 Units total) by mouth daily.   diclofenac Sodium (VOLTAREN) 1 % GEL Apply 2 g topically 4 (four) times daily as needed.   glucose blood (TRUE METRIX BLOOD GLUCOSE TEST) test strip TEST BLOOD SUGAR TWICE DAILY  AND AS NEEDED Dx E11.9   hydrALAZINE (APRESOLINE) 10 MG tablet Take 1 tablet (10 mg total) by mouth 2 (two) times daily.   JANUMET XR 50-500 MG TB24 TAKE 1 TABLET TWICE DAILY   Lancet Devices (TRUEDRAW LANCING DEVICE) MISC Teset BS BID Dx E11.9   losartan-hydrochlorothiazide (HYZAAR) 50-12.5 MG tablet Take 1 tablet by mouth daily.   metoprolol succinate (TOPROL-XL) 50 MG 24 hr tablet Take 1 tablet (50 mg total) by mouth daily. Take with or immediately following a meal.   nitroGLYCERIN (NITROSTAT) 0.4 MG SL tablet DISSOLVE 1 TABLET UNDER THE TONGUE EVERY 5 MINUTES AS NEEDED FOR CHEST PAIN   omeprazole (PRILOSEC) 40 MG capsule Take 1 capsule (40 mg total) by mouth daily.   polyethylene glycol (MIRALAX / GLYCOLAX) 17 g packet Take 17 g by mouth daily as needed.   Prodigy Twist Top Lancets 28G MISC TEST BLOOD SUGAR TWICE DAILY Dx E11.9   TRUEplus Lancets 30G MISC Teset BS  BID Dx E11.9   No facility-administered encounter medications on file as of 09/28/2020.    Patient Active Problem List   Diagnosis Date Noted   CKD stage 3 due to type 2 diabetes mellitus ([place]) 04/07/2020   LBBB (left bundle branch block) 07/20/2019   Dizziness 04/22/2018   Shoulder impingement syndrome, right 12/02/2017   GERD (gastroesophageal reflux disease) 10/20/2013   Frequency 02/18/2013   Coronary atherosclerosis of native coronary artery 01/14/2013   CAD, multiple  vessel 07/10/2012   CARDIAC MURMUR 07/03/2009   Type 2 diabetes mellitus ([place]) 09/12/2008   Hyperlipidemia associated with type 2 diabetes mellitus ([place]) 09/12/2008   Hypertension associated with diabetes ([place]) 09/12/2008    Conditions to be addressed/monitored: Monitor client management of anxiety and stress issues faced   Care Plan : LCSW Care Plan  Updates made by Katha Cabal, LCSW since 09/28/2020 12:00 AM     Problem: Anxiety Identification (Anxiety)      Goal: manage anxiety and stress symptoms   Start Date: 09/28/2020  Expected End Date: 12/19/2020  This Visit's Progress: On track  Recent Progress: On track  Priority: Medium  Note:   Current Barriers:  Chronic Mental Health needs related to management of anxiety and stress issues Mobility issues Pain Issues Challenges in completing ADLs Suicidal Ideation/Homicidal Ideation: No  Clinical Social Work Goal(s):  patient will work with SW monthly by telephone or in person to reduce or manage symptoms related to anxiety and stress issues faced patient will communicate with RNCM or LCSW as needed in next 30 days for CCM support  Interventions: 1:1 collaboration with [name], Fransisca Kaufmann, MD regarding development and update of comprehensive plan of care as evidenced by provider attestation and co-signature [name] [name] (client uses a cane to help her walk) [name] Talked with client about upcoming client medical appointments Talked with client about family support (support from son, support from nephew) Talked with client about medication procurement for client Encouraged client to call RNCM as needed for CCM nursing support Talked with client about support from her cardiologist Talked with client about appetite of client  Talked with client about client use of [employer] bus for local travel  Patient Self  Care Activities:  Self administers medications as prescribed Attends all scheduled provider appointments Performs ADL's independently  Patient Coping Strengths:  Family Friends  Patient Self Care Deficits:  Some pain issues Some mobility issues  Patient Goals:  - spend time or talk with others every day - practice relaxation or meditation daily - keep a calendar with appointment dates  Follow Up Plan: LCSW to call client on 11/08/20      [name]. MSW, LCSW Licensed Clinical Social Worker [facility] Care Management [phone]

## 2020-09-28 NOTE — Patient Instructions (Signed)
Visit Information  PATIENT GOALS:  Goals Addressed               This Visit's Progress     manage anxiety and stress symptoms (pt-stated)        Timeframe:  Short-Term Goal Priority:  Medium Progress: On Track Start Date:      09/28/20                       Expected End Date:                    12/19/20  Follow Up Date   11/08/20   Protect My Health (Patient) Manage Anxiety and Stress issues faced    Why is this important?   Screening tests can find diseases early when they are easier to treat.  Your doctor or nurse will talk with you about which tests are important for you.  Getting shots for common diseases like the flu and shingles will help prevent them.      Patient Self Care Activities:  Self administers medications as prescribed Attends all scheduled provider appointments Performs ADL's independently  Patient Coping Strengths:  Family Friends  Patient Self Care Deficits:  Some pain issues Some mobility issues  Patient Goals:  - spend time or talk with others every day - practice relaxation or meditation daily - keep a calendar with appointment dates  Follow Up Plan: LCSW to call client on 11/08/20            [name].Katrese Shell MSW, LCSW Licensed Clinical Social Worker [facility] Care Management [phone]

## 2020-10-09 ENCOUNTER — Other Ambulatory Visit: Payer: Self-pay | Admitting: Family Medicine

## 2020-10-09 DIAGNOSIS — E1159 Type 2 diabetes mellitus with other circulatory complications: Secondary | ICD-10-CM

## 2020-10-11 ENCOUNTER — Other Ambulatory Visit: Payer: Self-pay | Admitting: *Deleted

## 2020-10-11 MED ORDER — BD SWAB SINGLE USE REGULAR PADS: MEDICATED_PAD | 3 refills | Status: DC

## 2020-10-24 ENCOUNTER — Other Ambulatory Visit: Payer: Self-pay | Admitting: Family Medicine

## 2020-10-24 DIAGNOSIS — K2100 Gastro-esophageal reflux disease with esophagitis, without bleeding: Secondary | ICD-10-CM

## 2020-10-26 ENCOUNTER — Ambulatory Visit (INDEPENDENT_AMBULATORY_CARE_PROVIDER_SITE_OTHER): Payer: Medicare HMO | Admitting: *Deleted

## 2020-10-26 ENCOUNTER — Other Ambulatory Visit: Payer: Self-pay

## 2020-10-26 ENCOUNTER — Ambulatory Visit (INDEPENDENT_AMBULATORY_CARE_PROVIDER_SITE_OTHER): Payer: Medicare HMO | Admitting: Family Medicine

## 2020-10-26 ENCOUNTER — Encounter: Payer: Self-pay | Admitting: Family Medicine

## 2020-10-26 VITALS — BP 111/59 | HR 60 | Ht 63.0 in | Wt 150.0 lb

## 2020-10-26 DIAGNOSIS — E785 Hyperlipidemia, unspecified: Secondary | ICD-10-CM

## 2020-10-26 DIAGNOSIS — N183 Chronic kidney disease, stage 3 unspecified: Secondary | ICD-10-CM

## 2020-10-26 DIAGNOSIS — E1169 Type 2 diabetes mellitus with other specified complication: Secondary | ICD-10-CM | POA: Diagnosis not present

## 2020-10-26 DIAGNOSIS — I152 Hypertension secondary to endocrine disorders: Secondary | ICD-10-CM | POA: Diagnosis not present

## 2020-10-26 DIAGNOSIS — E1159 Type 2 diabetes mellitus with other circulatory complications: Secondary | ICD-10-CM

## 2020-10-26 DIAGNOSIS — Z23 Encounter for immunization: Secondary | ICD-10-CM

## 2020-10-26 DIAGNOSIS — E1122 Type 2 diabetes mellitus with diabetic chronic kidney disease: Secondary | ICD-10-CM

## 2020-10-26 DIAGNOSIS — K2100 Gastro-esophageal reflux disease with esophagitis, without bleeding: Secondary | ICD-10-CM

## 2020-10-26 LAB — BAYER DCA HB A1C WAIVED: HB A1C (BAYER DCA - WAIVED): 7 % — ABNORMAL HIGH (ref <7.0)

## 2020-10-26 MED ORDER — OMEPRAZOLE 40 MG PO CPDR: 40.0000 mg | DELAYED_RELEASE_CAPSULE | Freq: Every day | ORAL | 1 refills | Status: DC

## 2020-10-26 MED ORDER — HYDRALAZINE HCL 10 MG PO TABS: 10.0000 mg | ORAL_TABLET | Freq: Two times a day (BID) | ORAL | 3 refills | Status: DC

## 2020-10-26 NOTE — Progress Notes (Signed)
BP (!) 111/59   Pulse 60   Ht _0  (1.6 m)   Wt 150 lb (68 kg)   SpO2 97%   BMI 26.57 kg/m    Subjective:   Patient ID: Tonya Carpenter, female    DOB: [birth year], 75 y.o.   MRN: [id number]  HPI: Tonya Carpenter is a 75 y.o. female presenting on 10/26/2020 for Medical Management of Chronic Issues, Diabetes, Hypertension, and Chronic Kidney Disease   HPI Type 2 diabetes mellitus Patient comes in today for recheck of his diabetes. Patient has been currently taking Janumet, A1c 7.0 today. Patient is currently on an ACE inhibitor/ARB. Patient has not seen an ophthalmologist this year. Patient denies any issues with their feet. The symptom started onset as an adult hypertension and hyperlipidemia and CKD 3 ARE RELATED TO DM   Hypertension Patient is currently on losartan hydrochlorothiazide and metoprolol and hydralazine, and their blood pressure today is 111/59. Patient denies any lightheadedness or dizziness. Patient denies headaches, blurred vision, chest pains, shortness of breath, or weakness. Denies any side effects from medication and is content with current medication.   Hyperlipidemia Patient is coming in for recheck of his hyperlipidemia. The patient is currently taking atorvastatin. They deny any issues with myalgias or history of liver damage from it. They deny any focal numbness or weakness or chest pain.   GERD Patient is currently on omeprazole.  She denies any major symptoms or abdominal pain or belching or burping. She denies any blood in her stool or lightheadedness or dizziness.   Relevant past medical, surgical, family and social history reviewed and updated as indicated. Interim medical history since our last visit reviewed. Allergies and medications reviewed and updated.  Review of Systems  Constitutional:  Negative for chills and fever.  Eyes:  Negative for visual disturbance.  Respiratory:  Negative for chest tightness and shortness of breath.   Cardiovascular:   Negative for chest pain and leg swelling.  Musculoskeletal:  Negative for back pain and gait problem.  Skin:  Negative for rash.  Neurological:  Negative for light-headedness and headaches.  Psychiatric/Behavioral:  Negative for agitation and behavioral problems.   All other systems reviewed and are negative.  Per HPI unless specifically indicated above   Allergies as of 10/26/2020       Reactions   Trilyte [peg 3350-kcl-na Bicarb-nacl]    Nausea/vomtiing        Medication List        Accurate as of October 26, 2020  8:42 AM. If you have any questions, ask your nurse or doctor.          Accu-Chek Guide w/Device Kit Test BS BID and PRN dx.E11.9   acetaminophen 500 MG tablet Commonly known as: TYLENOL Take 1,000 mg by mouth every 6 (six) hours as needed for mild pain or headache.   aspirin 81 MG EC tablet Commonly known as: Aspirin Low Dose Take 1 tablet (81 mg total) by mouth daily. Swallow whole.   atorvastatin 20 MG tablet Commonly known as: LIPITOR Take 1 tablet (20 mg total) by mouth daily.   B-D SINGLE USE SWABS REGULAR Pads Test BS BID and PRN dx.E11.9   cholecalciferol 25 MCG (1000 UNIT) tablet Commonly known as: VITAMIN D Take 1 tablet (1,000 Units total) by mouth daily.   diclofenac Sodium 1 % Gel Commonly known as: VOLTAREN Apply 2 g topically 4 (four) times daily as needed.   hydrALAZINE 10 MG tablet Commonly known as: APRESOLINE Take  1 tablet (10 mg total) by mouth 2 (two) times daily.   Janumet XR 50-500 MG Tb24 Generic drug: SitaGLIPtin-MetFORMIN HCl TAKE 1 TABLET TWICE DAILY   losartan-hydrochlorothiazide 50-12.5 MG tablet Commonly known as: HYZAAR Take 1 tablet by mouth daily.   metoprolol succinate 50 MG 24 hr tablet Commonly known as: TOPROL-XL Take 1 tablet (50 mg total) by mouth daily. Take with or immediately following a meal.   nitroGLYCERIN 0.4 MG SL tablet Commonly known as: NITROSTAT DISSOLVE 1 TABLET UNDER THE TONGUE  EVERY 5 MINUTES AS NEEDED FOR CHEST PAIN   omeprazole 40 MG capsule Commonly known as: PRILOSEC Take 1 capsule (40 mg total) by mouth daily.   polyethylene glycol 17 g packet Commonly known as: MIRALAX / GLYCOLAX Take 17 g by mouth daily as needed.   True Metrix Blood Glucose Test test strip Generic drug: glucose blood TEST BLOOD SUGAR TWICE DAILY  AND AS NEEDED Dx E11.9   True Metrix Level 1 Low Soln Use as needed with BS machine   TRUEdraw Lancing Device Misc Teset BS BID Dx E11.9   TRUEplus Lancets 30G Misc Teset BS BID Dx E11.9   Accu-Chek Softclix Lancets lancets Test BS BID and PRN dx.E11.9   Prodigy Twist Top Lancets 28G Misc TEST BLOOD SUGAR TWICE DAILY Dx E11.9         Objective:   BP (!) 111/59   Pulse 60   Ht _0  (1.6 m)   Wt 150 lb (68 kg)   SpO2 97%   BMI 26.57 kg/m   Wt Readings from Last 3 Encounters:  10/26/20 150 lb (68 kg)  06/27/20 160 lb (72.6 kg)  04/07/20 160 lb (72.6 kg)    Physical Exam Vitals and nursing note reviewed.  Constitutional:      General: She is not in acute distress.    Appearance: She is well-developed. She is not diaphoretic.  Eyes:     Conjunctiva/sclera: Conjunctivae normal.  Cardiovascular:     Rate and Rhythm: Normal rate and regular rhythm.     Heart sounds: Normal heart sounds. No murmur heard. Pulmonary:     Effort: Pulmonary effort is normal. No respiratory distress.     Breath sounds: Normal breath sounds. No wheezing.  Skin:    General: Skin is warm and dry.     Findings: No rash.  Neurological:     Mental Status: She is alert and oriented to person, place, and time.     Coordination: Coordination normal.  Psychiatric:        Behavior: Behavior normal.      Assessment & Plan:   Problem List Items Addressed This Visit       Cardiovascular and Mediastinum   Hypertension associated with diabetes ([place])   Relevant Medications   hydrALAZINE (APRESOLINE) 10 MG tablet   Other Relevant Orders    CBC with Differential/Platelet   CMP14+EGFR   Lipid panel   Bayer DCA Hb A1c Waived     Digestive   GERD (gastroesophageal reflux disease)   Relevant Medications   omeprazole (PRILOSEC) 40 MG capsule     Endocrine   Type 2 diabetes mellitus ([place]) - Primary   Relevant Orders   CBC with Differential/Platelet   CMP14+EGFR   Lipid panel   Bayer DCA Hb A1c Waived   Hyperlipidemia associated with type 2 diabetes mellitus (HCC)   Relevant Orders   CBC with Differential/Platelet   CMP14+EGFR   Lipid panel   Bayer DCA Hb A1c  Waived   CKD stage 3 due to type 2 diabetes mellitus ([place])   Other Visit Diagnoses     Need for shingles vaccine       Relevant Orders   Varicella-zoster vaccine IM (Shingrix)       Continue current medication, A1c 7.0, is sufficient for her, blood pressure looks decent today, no changes in medication.  Patient is having issues where somebody came in and had her sign something to change her insurance and she does not want to change but she said they rushed him and she did not understand it fully.  We will get her in contact with the social worker Cyril Mourning when she comes in later today and will have them help her Follow up plan: Return in about 3 months (around 01/26/2021), or if symptoms worsen or fail to improve, for Diabetes and hypertension and CKD.  Counseling provided for all of the vaccine components Orders Placed This Encounter  Procedures   Varicella-zoster vaccine IM (Shingrix)   CBC with Differential/Platelet   CMP14+EGFR   Lipid panel   Bayer DCA Hb A1c Waived    Caryl Pina, MD [facility] 10/26/2020, 8:42 AM

## 2020-10-26 NOTE — Chronic Care Management (AMB) (Signed)
Chronic Care Management   CCM RN Visit Note  10/26/2020 Name: Tonya Carpenter MRN: [id number] DOB: 06-30-1945  Subjective: Tonya Carpenter is a 75 y.o. year old female who is a primary care patient of [name], Fransisca Kaufmann, MD. The care management team was consulted for assistance with disease management and care coordination needs.    Engaged with patient by telephone for follow up visit in response to provider referral for case management and/or care coordination services.   Consent to Services:  The patient was given information about Chronic Care Management services, agreed to services, and gave verbal consent prior to initiation of services.  Please see initial visit note for detailed documentation.   Patient agreed to services and verbal consent obtained.   Assessment: Review of patient past medical history, allergies, medications, health status, including review of consultants reports, laboratory and other test data, was performed as part of comprehensive evaluation and provision of chronic care management services.   SDOH (Social Determinants of Health) assessments and interventions performed:    CCM Care Plan  Allergies  Allergen Reactions   Trilyte [Peg 3350-Kcl-Na Bicarb-Nacl]     Nausea/vomtiing    Outpatient Encounter Medications as of 10/26/2020  Medication Sig   Accu-Chek Softclix Lancets lancets Test BS BID and PRN dx.E11.9   acetaminophen (TYLENOL) 500 MG tablet Take 1,000 mg by mouth every 6 (six) hours as needed for mild pain or headache.    Alcohol Swabs (B-D SINGLE USE SWABS REGULAR) PADS Test BS BID and PRN dx.E11.9   aspirin (ASPIRIN LOW DOSE) 81 MG EC tablet Take 1 tablet (81 mg total) by mouth daily. Swallow whole.   atorvastatin (LIPITOR) 20 MG tablet Take 1 tablet (20 mg total) by mouth daily.   Blood Glucose Calibration (TRUE METRIX LEVEL 1) Low SOLN Use as needed with BS machine   Blood Glucose Monitoring Suppl (ACCU-CHEK GUIDE) w/Device KIT Test BS BID and PRN  dx.E11.9   cholecalciferol (VITAMIN D) 25 MCG (1000 UNIT) tablet Take 1 tablet (1,000 Units total) by mouth daily.   diclofenac Sodium (VOLTAREN) 1 % GEL Apply 2 g topically 4 (four) times daily as needed.   glucose blood (TRUE METRIX BLOOD GLUCOSE TEST) test strip TEST BLOOD SUGAR TWICE DAILY  AND AS NEEDED Dx E11.9   hydrALAZINE (APRESOLINE) 10 MG tablet Take 1 tablet (10 mg total) by mouth 2 (two) times daily.   JANUMET XR 50-500 MG TB24 TAKE 1 TABLET TWICE DAILY   Lancet Devices (TRUEDRAW LANCING DEVICE) MISC Teset BS BID Dx E11.9   losartan-hydrochlorothiazide (HYZAAR) 50-12.5 MG tablet Take 1 tablet by mouth daily.   metoprolol succinate (TOPROL-XL) 50 MG 24 hr tablet Take 1 tablet (50 mg total) by mouth daily. Take with or immediately following a meal.   nitroGLYCERIN (NITROSTAT) 0.4 MG SL tablet DISSOLVE 1 TABLET UNDER THE TONGUE EVERY 5 MINUTES AS NEEDED FOR CHEST PAIN   omeprazole (PRILOSEC) 40 MG capsule Take 1 capsule (40 mg total) by mouth daily.   polyethylene glycol (MIRALAX / GLYCOLAX) 17 g packet Take 17 g by mouth daily as needed.   Prodigy Twist Top Lancets 28G MISC TEST BLOOD SUGAR TWICE DAILY Dx E11.9   TRUEplus Lancets 30G MISC Teset BS BID Dx E11.9   No facility-administered encounter medications on file as of 10/26/2020.    Patient Active Problem List   Diagnosis Date Noted   CKD stage 3 due to type 2 diabetes mellitus ([place]) 04/07/2020   LBBB (left bundle branch block)  07/20/2019   Dizziness 04/22/2018   Shoulder impingement syndrome, right 12/02/2017   GERD (gastroesophageal reflux disease) 10/20/2013   Frequency 02/18/2013   Coronary atherosclerosis of native coronary artery 01/14/2013   CAD, multiple vessel 07/10/2012   CARDIAC MURMUR 07/03/2009   Type 2 diabetes mellitus ([place]) 09/12/2008   Hyperlipidemia associated with type 2 diabetes mellitus ([place]) 09/12/2008   Hypertension associated with diabetes ([place]) 09/12/2008    Conditions to be  addressed/monitored:HTN and DMII  Care Plan : [facility] Care Plan  Updates made by Ilean China, RN since 10/26/2020 12:00 AM     Problem: Chronic Disease Management Needs   Priority: Medium     Long-Range Goal: Work with [facility] Regarding Care Management and Care Coordination Associated with HTN and DM   This Visit's Progress: On track  Priority: Medium  Note:   Current Barriers:  Chronic Disease Management support and education needs related to HTN and DMII Lacks caregiver support.  Film/video editor.  Literacy barriers Transportation barriers  RNCM Clinical Goal(s):  Patient will verbalize understanding of plan for management of HTN and DMII take all medications exactly as prescribed and will call provider for medication related questions demonstrate ongoing adherence to prescribed treatment plan for HTN and DMII as evidenced by adherence to ADA/ carb modified diet adherence to prescribed medication regimen contacting provider for new or worsened symptoms or questions    Interventions: 1:1 collaboration with primary care provider regarding development and update of comprehensive plan of care as evidenced by provider attestation and co-signature Inter-disciplinary care team collaboration (see longitudinal plan of care) Evaluation of current treatment plan related to  self management and patient's adherence to plan as established by provider Consulted by PCP after office visit today. Patient was visited by an insurance agent with [employer] yesterday at her home. Spoke with patient regarding visit. She was nervous and concerned that she may have changed her insurance even though she didn't intend to.  I reached out to Jackelyn Hoehn with [employer] at [phone] and verified that he did meet with Ms Toso at her home yesterday. He assisted her in applying for Medicare Extra Help for Prescription Drug Coverage and they discussed [employer] HMO vs PPO. She has a PPO plan now  but does not utilize all of the benefits and would save money on premiums by switching to a PPO. Erlene Quan said that he would call her back to go over everything again so that she feels comfortable with her decision.    Diabetes:  (Status: Goal on track: YES.) Lab Results  Component Value Date   HGBA1C 7.0 (H) 10/26/2020  Assessed patient's understanding of A1c goal: <7% Provided education to patient about basic DM disease process; Reviewed medications with patient and discussed importance of medication adherence;        Counseled on importance of regular laboratory monitoring as prescribed;        Discussed plans with patient for ongoing care management follow up and provided patient with direct contact information for care management team;      Advised patient, providing education and rationale, to check cbg daily and record        Review of patient status, including review of consultants reports, relevant laboratory and other test results, and medications completed;       Assessed social determinant of health barriers;         Hypertension: (Status: Goal on track: YES.) Last practice recorded BP readings:  BP Readings from Last  3 Encounters:  10/26/20 (!) 111/59  07/21/20 111/66  04/07/20 132/66  Most recent eGFR/CrCl:  Lab Results  Component Value Date   EGFR 41 (L) 07/21/2020    No components found for: CRCL  Evaluation of current treatment plan related to hypertension self management and patient's adherence to plan as established by provider;   Provided education to patient re: stroke prevention, s/s of heart attack and stroke; Reviewed medications with patient and discussed importance of compliance;  Counseled on the importance of exercise goals with target of 150 minutes per week Discussed plans with patient for ongoing care management follow up and provided patient with direct contact information for care management team; Provided education on prescribed diet low sodium/DASH  diet;  Discussed complications of poorly controlled blood pressure such as heart disease, stroke, circulatory complications, vision complications, kidney impairment, sexual dysfunction;   Patient Goals/Self-Care Activities: Patient will self administer medications as prescribed Patient will attend all scheduled provider appointments Patient will continue to perform ADL's independently Patient will continue to perform IADL's independently Patient will call provider office for new concerns or questions       Plan:Telephone follow up appointment with care management team member scheduled for:  11/29/20 with RNCM and The patient has been provided with contact information for the care management team and has been advised to call with any health related questions or concerns.   Chong Sicilian, BSN, RN-BC Embedded Chronic Care Manager [facility] / [place] Management Direct Dial: [phone]

## 2020-10-26 NOTE — Patient Instructions (Signed)
Visit Information  PATIENT GOALS:  Goals Addressed             This Visit's Progress    Monitor and Manage My Blood Sugar-Diabetes Type 2   On track    Timeframe:  Long-Range Goal Priority:  Medium Start Date:                             Expected End Date:                       Follow Up Date 11/29/2020    Check blood sugar daily and write it down in a log Take log to PCP appointments Call PCP if your blood sugar is too high or too low Take medication as prescribed Do not eat a lot of simple carbohydrates or sweets Call RN Care Manager as needed [phone]    Why is this important?   Checking your blood sugar at home helps to keep it from getting very high or very low.  Writing the results in a diary or log helps the doctor know how to care for you.  Your blood sugar log should have the time, date and the results.  Also, write down the amount of insulin or other medicine that you take.  Other information, like what you ate, exercise done and how you were feeling, will also be helpful.     Notes:      Track and Manage My Blood Pressure-Hypertension   On track    Timeframe:  Long-Range Goal Priority:  Medium Start Date:                             Expected End Date:                       Follow Up Date 11/29/20    Check and write down your blood pressure at least 3 times per week Call your PCP with any readings that are too high or too low Come to all of your medical appointments Take your medications as prescribed Call RN Care Manager as needed [phone] Do not add extra salt to food   Why is this important?   You won't feel high blood pressure, but it can still hurt your blood vessels.  High blood pressure can cause heart or kidney problems. It can also cause a stroke.  Making lifestyle changes like losing a little weight or eating less salt will help.  Checking your blood pressure at home and at different times of the day can help to control blood pressure.   If the doctor prescribes medicine remember to take it the way the doctor ordered.  Call the office if you cannot afford the medicine or if there are questions about it.     Notes:         The patient verbalized understanding of instructions, educational materials, and care plan provided today and declined offer to receive copy of patient instructions, educational materials, and care plan.    Plan:Telephone follow up appointment with care management team member scheduled for:  11/29/20 with RNCM and The patient has been provided with contact information for the care management team and has been advised to call with any health related questions or concerns.   Demetrios Loll, BSN, RN-BC Embedded Chronic Care Manager [facility] / [facility] Care  Management Direct Dial: [phone]

## 2020-10-27 LAB — LIPID PANEL
Chol/HDL Ratio: 2.7 ratio (ref 0.0–4.4)
Cholesterol, Total: 136 mg/dL (ref 100–199)
HDL: 51 mg/dL (ref >39)
LDL Chol Calc (NIH): 66 mg/dL (ref 0–99)
Triglycerides: 101 mg/dL (ref 0–149)
VLDL Cholesterol Cal: 19 mg/dL (ref 5–40)

## 2020-10-27 LAB — CMP14+EGFR
ALT: 11 IU/L (ref 0–32)
AST: 15 IU/L (ref 0–40)
Albumin/Globulin Ratio: 1.1 — ABNORMAL LOW (ref 1.2–2.2)
Albumin: 4.1 g/dL (ref 3.7–4.7)
Alkaline Phosphatase: 62 IU/L (ref 44–121)
BUN/Creatinine Ratio: 16 (ref 12–28)
BUN: 20 mg/dL (ref 8–27)
Bilirubin Total: 0.4 mg/dL (ref 0.0–1.2)
CO2: 24 mmol/L (ref 20–29)
Calcium: 9.7 mg/dL (ref 8.7–10.3)
Chloride: 102 mmol/L (ref 96–106)
Creatinine, Ser: 1.24 mg/dL — ABNORMAL HIGH (ref 0.57–1.00)
Globulin, Total: 3.7 g/dL (ref 1.5–4.5)
Glucose: 117 mg/dL — ABNORMAL HIGH (ref 65–99)
Potassium: 4 mmol/L (ref 3.5–5.2)
Sodium: 141 mmol/L (ref 134–144)
Total Protein: 7.8 g/dL (ref 6.0–8.5)
eGFR: 46 mL/min/{1.73_m2} — ABNORMAL LOW (ref >59)

## 2020-10-27 LAB — CBC WITH DIFFERENTIAL/PLATELET
Basophils Absolute: 0 10*3/uL (ref 0.0–0.2)
Basos: 0 %
EOS (ABSOLUTE): 0.1 10*3/uL (ref 0.0–0.4)
Eos: 1 %
Hematocrit: 35 % (ref 34.0–46.6)
Hemoglobin: 11.1 g/dL (ref 11.1–15.9)
Immature Grans (Abs): 0 10*3/uL (ref 0.0–0.1)
Immature Granulocytes: 0 %
Lymphocytes Absolute: 2.5 10*3/uL (ref 0.7–3.1)
Lymphs: 30 %
MCH: 26.9 pg (ref 26.6–33.0)
MCHC: 31.7 g/dL (ref 31.5–35.7)
MCV: 85 fL (ref 79–97)
Monocytes Absolute: 0.5 10*3/uL (ref 0.1–0.9)
Monocytes: 6 %
Neutrophils Absolute: 5.2 10*3/uL (ref 1.4–7.0)
Neutrophils: 63 %
Platelets: 250 10*3/uL (ref 150–450)
RBC: 4.12 x10E6/uL (ref 3.77–5.28)
RDW: 12.6 % (ref 11.7–15.4)
WBC: 8.3 10*3/uL (ref 3.4–10.8)

## 2020-10-31 ENCOUNTER — Telehealth: Payer: Self-pay | Admitting: Family Medicine

## 2020-10-31 NOTE — Telephone Encounter (Signed)
Tried calling pt. No answer and no machine. 

## 2020-11-02 ENCOUNTER — Telehealth: Payer: Self-pay | Admitting: Family Medicine

## 2020-11-02 NOTE — Telephone Encounter (Signed)
Pt called stating that she really needs a home health aid because she has so many health issues and hurts so bad. Says she was told that she makes too much money so she couldn't get any help but pt wants to know if there are any other options because she really needs help.  Please advise and call patient.

## 2020-11-02 NOTE — Telephone Encounter (Signed)
Pt came in regarding this issue. Pt says she has talked to her social worker from the medicaid office about needing full medicaid so that she can get some help from a nurse at home. Pt says the social worker told her that she may not be eligible for full medicaid benefits because she makes too much money.  Explained to pt that per [facility], she would need to call the medicaid office to get approved for medicaid because that is the only way we could send a request to get her help at home.  Pt voiced understanding and said she would try to keep calling her social worker to get her approved for medicaid.

## 2020-11-06 ENCOUNTER — Telehealth: Payer: Self-pay | Admitting: Family Medicine

## 2020-11-06 NOTE — Telephone Encounter (Signed)
Made pt aware that the nurse that manages our home health referrals states that she needs to get in touch with her social worker and apply for medicaid to see if she is eligible for home health.  Pt states that she will get in touch with her caseworker and then go from there.

## 2020-11-08 ENCOUNTER — Ambulatory Visit: Payer: Medicare HMO | Admitting: Licensed Clinical Social Worker

## 2020-11-08 DIAGNOSIS — E1159 Type 2 diabetes mellitus with other circulatory complications: Secondary | ICD-10-CM

## 2020-11-08 DIAGNOSIS — N183 Chronic kidney disease, stage 3 unspecified: Secondary | ICD-10-CM

## 2020-11-08 DIAGNOSIS — E1169 Type 2 diabetes mellitus with other specified complication: Secondary | ICD-10-CM

## 2020-11-08 DIAGNOSIS — I152 Hypertension secondary to endocrine disorders: Secondary | ICD-10-CM

## 2020-11-08 DIAGNOSIS — E1122 Type 2 diabetes mellitus with diabetic chronic kidney disease: Secondary | ICD-10-CM

## 2020-11-08 DIAGNOSIS — M7541 Impingement syndrome of right shoulder: Secondary | ICD-10-CM

## 2020-11-08 DIAGNOSIS — E785 Hyperlipidemia, unspecified: Secondary | ICD-10-CM | POA: Diagnosis not present

## 2020-11-08 DIAGNOSIS — I2510 Atherosclerotic heart disease of native coronary artery without angina pectoris: Secondary | ICD-10-CM

## 2020-11-08 DIAGNOSIS — K2100 Gastro-esophageal reflux disease with esophagitis, without bleeding: Secondary | ICD-10-CM

## 2020-11-08 DIAGNOSIS — R42 Dizziness and giddiness: Secondary | ICD-10-CM

## 2020-11-08 NOTE — Patient Instructions (Signed)
Visit Information  PATIENT GOALS:  Goals Addressed               This Visit's Progress     manage anxiety and stress symptoms (pt-stated)        Timeframe:  Short-Term Goal Priority:  Medium Progress: On Track Start Date:      11/08/20                       Expected End Date:                  02/07/21  Follow Up Date  12/18/20     Protect My Health (Patient) Manage Anxiety and Stress issues faced    Why is this important?   Screening tests can find diseases early when they are easier to treat.  Your doctor or nurse will talk with you about which tests are important for you.  Getting shots for common diseases like the flu and shingles will help prevent them.      Patient Self Care Activities:  Self administers medications as prescribed Attends all scheduled provider appointments Performs ADL's independently  Patient Coping Strengths:  Family Friends  Patient Self Care Deficits:  Some pain issues Some mobility issues  Patient Goals:  - spend time or talk with others every day - practice relaxation or meditation daily - keep a calendar with appointment dates  Follow Up Plan: LCSW to call client on 12/18/20 to assess client needs     [name].Arnetia Bronk MSW, LCSW Licensed Clinical Social Worker [facility] Care Management [phone]

## 2020-11-08 NOTE — Chronic Care Management (AMB) (Signed)
Chronic Care Management    Clinical Social Work Note  11/08/2020 Name: Tonya Carpenter MRN: [id number] DOB: May 12, 1945  Tonya Carpenter is a 75 y.o. year old female who is a primary care patient of [name], Fransisca Kaufmann, MD. The CCM team was consulted to assist the patient with chronic disease management and/or care coordination needs related to: [name] .   Engaged with patient by telephone for follow up visit in response to provider referral for social work chronic care management and care coordination services.   Consent to Services:  The patient was given information about Chronic Care Management services, agreed to services, and gave verbal consent prior to initiation of services.  Please see initial visit note for detailed documentation.   Patient agreed to services and consent obtained.   Assessment: Review of patient past medical history, allergies, medications, and health status, including review of relevant consultants reports was performed today as part of a comprehensive evaluation and provision of chronic care management and care coordination services.     SDOH (Social Determinants of Health) assessments and interventions performed:  SDOH Interventions    Flowsheet Row Most Recent Value  SDOH Interventions   Stress Interventions Provide Counseling  [client has stress/anxiety over in home care needs and she has reduced family support]  Depression Interventions/Treatment  Counseling        Advanced Directives Status: See Vynca application for related entries.  CCM Care Plan  Allergies  Allergen Reactions   Trilyte [Peg 3350-Kcl-Na Bicarb-Nacl]     Nausea/vomtiing    Outpatient Encounter Medications as of 11/08/2020  Medication Sig   Accu-Chek Softclix Lancets lancets Test BS BID and PRN dx.E11.9   acetaminophen (TYLENOL) 500 MG tablet Take 1,000 mg by mouth every 6 (six) hours as needed for mild pain or headache.    Alcohol Swabs (B-D SINGLE USE SWABS REGULAR)  PADS Test BS BID and PRN dx.E11.9   aspirin (ASPIRIN LOW DOSE) 81 MG EC tablet Take 1 tablet (81 mg total) by mouth daily. Swallow whole.   atorvastatin (LIPITOR) 20 MG tablet Take 1 tablet (20 mg total) by mouth daily.   Blood Glucose Calibration (TRUE METRIX LEVEL 1) Low SOLN Use as needed with BS machine   Blood Glucose Monitoring Suppl (ACCU-CHEK GUIDE) w/Device KIT Test BS BID and PRN dx.E11.9   cholecalciferol (VITAMIN D) 25 MCG (1000 UNIT) tablet Take 1 tablet (1,000 Units total) by mouth daily.   diclofenac Sodium (VOLTAREN) 1 % GEL Apply 2 g topically 4 (four) times daily as needed.   glucose blood (TRUE METRIX BLOOD GLUCOSE TEST) test strip TEST BLOOD SUGAR TWICE DAILY  AND AS NEEDED Dx E11.9   hydrALAZINE (APRESOLINE) 10 MG tablet Take 1 tablet (10 mg total) by mouth 2 (two) times daily.   JANUMET XR 50-500 MG TB24 TAKE 1 TABLET TWICE DAILY   Lancet Devices (TRUEDRAW LANCING DEVICE) MISC Teset BS BID Dx E11.9   losartan-hydrochlorothiazide (HYZAAR) 50-12.5 MG tablet Take 1 tablet by mouth daily.   metoprolol succinate (TOPROL-XL) 50 MG 24 hr tablet Take 1 tablet (50 mg total) by mouth daily. Take with or immediately following a meal.   nitroGLYCERIN (NITROSTAT) 0.4 MG SL tablet DISSOLVE 1 TABLET UNDER THE TONGUE EVERY 5 MINUTES AS NEEDED FOR CHEST PAIN   omeprazole (PRILOSEC) 40 MG capsule Take 1 capsule (40 mg total) by mouth daily.   polyethylene glycol (MIRALAX / GLYCOLAX) 17 g packet Take 17 g by mouth daily as needed.   Prodigy  Twist Top Lancets 28G MISC TEST BLOOD SUGAR TWICE DAILY Dx E11.9   TRUEplus Lancets 30G MISC Teset BS BID Dx E11.9   No facility-administered encounter medications on file as of 11/08/2020.    Patient Active Problem List   Diagnosis Date Noted   CKD stage 3 due to type 2 diabetes mellitus ([place]) 04/07/2020   LBBB (left bundle branch block) 07/20/2019   Dizziness 04/22/2018   Shoulder impingement syndrome, right 12/02/2017   GERD (gastroesophageal  reflux disease) 10/20/2013   Frequency 02/18/2013   Coronary atherosclerosis of native coronary artery 01/14/2013   CAD, multiple vessel 07/10/2012   CARDIAC MURMUR 07/03/2009   Type 2 diabetes mellitus ([place]) 09/12/2008   Hyperlipidemia associated with type 2 diabetes mellitus ([place]) 09/12/2008   Hypertension associated with diabetes ([place]) 09/12/2008    Conditions to be addressed/monitored: monitor client management of anxiety and stress issues  Care Plan : LCSW Care Plan  Updates made by Katha Cabal, LCSW since 11/08/2020 12:00 AM     Problem: Anxiety Identification (Anxiety)      Goal: manage anxiety and stress symptoms   Start Date: 11/08/2020  Expected End Date: 02/07/2021  This Visit's Progress: On track  Recent Progress: On track  Priority: Medium  Note:   Current Barriers:  Chronic Mental Health needs related to management of anxiety and stress issues Mobility issues Pain Issues Challenges in completing ADLs Suicidal Ideation/Homicidal Ideation: No  Clinical Social Work Goal(s):  patient will work with SW monthly by telephone or in person to reduce or manage symptoms related to anxiety and stress issues faced patient will communicate with RNCM or LCSW as needed in next 30 days for CCM support  Interventions: 1:1 collaboration with [name], Fransisca Kaufmann, MD regarding development and update of comprehensive plan of care as evidenced by provider attestation and co-signature [name] (client uses a cane to help her walk) [name] Talked with client about upcoming client medical appointments Talked with client about family support (client said she has reduced family support) Talked with client about medication procurement for client Encouraged client to call RNCM as needed for CCM nursing support Talked with client about support from her cardiologist (she said she has appointment with  cardiologist in Spring of 2023) Talked with client about appetite of client  Talked with client about client use of [employer] bus for local travel Talked with client about her Food Stamps benefit Talked with client about Medicaid application (she has been communicating with DSS Medicaid caseworker periodically about Medicaid eligibility) Talked with client about support from endocrinologist. She said she has appointment in October of 2022 with endocrinologist Talked with client about relaxation techniques. She likes to read the Bible, she likes to go to church regularly, she watches TV, and enjoys going to local businesses to shop. Talked with client about pain issues. She spoke of knee pain issues  Patient Self Care Activities:  Self administers medications as prescribed Attends all scheduled provider appointments Performs ADL's independently  Patient Coping Strengths:  Family Friends  Patient Self Care Deficits:  Some pain issues Some mobility issues  Patient Goals:  - spend time or talk with others every day - practice relaxation or meditation daily - keep a calendar with appointment dates  Follow Up Plan: LCSW to call client on 12/18/20 to assess client needs      [name].Chasta Deshpande MSW, LCSW Licensed Clinical Social Worker [facility] Care Management [phone]

## 2020-11-19 ENCOUNTER — Other Ambulatory Visit: Payer: Self-pay | Admitting: Family Medicine

## 2020-11-29 ENCOUNTER — Ambulatory Visit: Payer: Medicare HMO | Admitting: *Deleted

## 2020-11-29 DIAGNOSIS — N183 Chronic kidney disease, stage 3 unspecified: Secondary | ICD-10-CM

## 2020-11-29 DIAGNOSIS — E1159 Type 2 diabetes mellitus with other circulatory complications: Secondary | ICD-10-CM

## 2020-11-29 NOTE — Patient Instructions (Signed)
Visit Information  PATIENT GOALS:  Goals Addressed             This Visit's Progress    Update Designated Party Release       Timeframe:  Short-Term Goal Priority:  Medium Start Date:     11/29/20                        Expected End Date:   03/17/21                     Follow-up: within 1 week. Awaiting patient's return call.   Consider updating DPR to include at least one person that we can talk with regarding medical or resource needs  Demetrios Loll, BSN, RN-BC Embedded Chronic Care Manager [facility] Care Management Direct Dial: [phone]          Patient's son verbalized understanding of my request to have Ms Hilliker return my call at [phone].   Plan:The patient has been provided with contact information for the care management team and has been advised to call with any health related questions or concerns.     Demetrios Loll, BSN, RN-BC Embedded Chronic Care Manager [facility] / [facility] Care Management Direct Dial: [phone]

## 2020-11-29 NOTE — Chronic Care Management (AMB) (Signed)
Chronic Care Management   CCM RN Visit Note  11/29/2020 Name: Tonya Carpenter MRN: [id number] DOB: [birth year]  Subjective: Tonya Carpenter is a 75 y.o. year old female who is a primary care patient of [name], Tonya Kaufmann, MD. The care management team was consulted for assistance with disease management and care coordination needs.    Engaged with patient's son, Tonya Carpenter, by telephone in patient's absence  for follow up visit in response to provider referral for case management and/or care coordination services.   Consent to Services:  The patient was given information about Chronic Care Management services, agreed to services, and gave verbal consent prior to initiation of services.  Please see initial visit note for detailed documentation.   Patient agreed to services and verbal consent obtained.   Assessment: Review of patient past medical history, allergies, medications, health status, including review of consultants reports, laboratory and other test data, was performed as part of comprehensive evaluation and provision of chronic care management services.   SDOH (Social Determinants of Health) assessments and interventions performed:    CCM Care Plan  Allergies  Allergen Reactions   Trilyte [Peg 3350-Kcl-Na Bicarb-Nacl]     Nausea/vomtiing    Outpatient Encounter Medications as of 11/29/2020  Medication Sig   Accu-Chek Softclix Lancets lancets Test BS BID and PRN dx.E11.9   acetaminophen (TYLENOL) 500 MG tablet Take 1,000 mg by mouth every 6 (six) hours as needed for mild pain or headache.    Alcohol Swabs (DROPSAFE ALCOHOL PREP) 70 % PADS USE TO TEST BLOOD SUGAR TWICE DAILY AND AS NEEDED AS DIRECTED   aspirin (ASPIRIN LOW DOSE) 81 MG EC tablet Take 1 tablet (81 mg total) by mouth daily. Swallow whole.   atorvastatin (LIPITOR) 20 MG tablet Take 1 tablet (20 mg total) by mouth daily.   Blood Glucose Calibration (TRUE METRIX LEVEL 1) Low SOLN Use as needed with BS machine   Blood Glucose  Monitoring Suppl (ACCU-CHEK GUIDE) w/Device KIT Test BS BID and PRN dx.E11.9   cholecalciferol (VITAMIN D) 25 MCG (1000 UNIT) tablet Take 1 tablet (1,000 Units total) by mouth daily.   diclofenac Sodium (VOLTAREN) 1 % GEL Apply 2 g topically 4 (four) times daily as needed.   glucose blood (TRUE METRIX BLOOD GLUCOSE TEST) test strip TEST BLOOD SUGAR TWICE DAILY  AND AS NEEDED Dx E11.9   hydrALAZINE (APRESOLINE) 10 MG tablet Take 1 tablet (10 mg total) by mouth 2 (two) times daily.   JANUMET XR 50-500 MG TB24 TAKE 1 TABLET TWICE DAILY   Lancet Devices (TRUEDRAW LANCING DEVICE) MISC Teset BS BID Dx E11.9   losartan-hydrochlorothiazide (HYZAAR) 50-12.5 MG tablet Take 1 tablet by mouth daily.   metoprolol succinate (TOPROL-XL) 50 MG 24 hr tablet Take 1 tablet (50 mg total) by mouth daily. Take with or immediately following a meal.   nitroGLYCERIN (NITROSTAT) 0.4 MG SL tablet DISSOLVE 1 TABLET UNDER THE TONGUE EVERY 5 MINUTES AS NEEDED FOR CHEST PAIN   omeprazole (PRILOSEC) 40 MG capsule Take 1 capsule (40 mg total) by mouth daily.   polyethylene glycol (MIRALAX / GLYCOLAX) 17 g packet Take 17 g by mouth daily as needed.   Prodigy Twist Top Lancets 28G MISC TEST BLOOD SUGAR TWICE DAILY Dx E11.9   TRUEplus Lancets 30G MISC Teset BS BID Dx E11.9   No facility-administered encounter medications on file as of 11/29/2020.    Patient Active Problem List   Diagnosis Date Noted   CKD stage 3 due to type  2 diabetes mellitus ([place]) 04/07/2020   LBBB (left bundle branch block) 07/20/2019   Dizziness 04/22/2018   Shoulder impingement syndrome, right 12/02/2017   GERD (gastroesophageal reflux disease) 10/20/2013   Frequency 02/18/2013   Coronary atherosclerosis of native coronary artery 01/14/2013   CAD, multiple vessel 07/10/2012   CARDIAC MURMUR 07/03/2009   Type 2 diabetes mellitus ([place]) 09/12/2008   Hyperlipidemia associated with type 2 diabetes mellitus ([place]) 09/12/2008   Hypertension associated with  diabetes ([place]) 09/12/2008    Conditions to be addressed/monitored:HTN and DMII  Care Plan : [facility] Care Plan  Updates made by Ilean China, RN since 11/29/2020 12:00 AM     Problem: Chronic Disease Management Needs   Priority: Medium     Long-Range Goal: Work with [facility] Regarding Care Management and Care Coordination Associated with HTN and DM   This Visit's Progress: On track  Recent Progress: On track  Priority: Medium  Note:   Current Barriers:  Chronic Disease Management support and education needs related to HTN and DMII Lacks caregiver support.  Film/video editor.  Literacy barriers Transportation barriers  RNCM Clinical Goal(s):  Patient will verbalize understanding of plan for management of HTN and DMII take all medications exactly as prescribed and will call provider for medication related questions demonstrate ongoing adherence to prescribed treatment plan for HTN and DMII as evidenced by adherence to ADA/ carb modified diet adherence to prescribed medication regimen contacting provider for new or worsened symptoms or questions    Interventions: 1:1 collaboration with primary care provider regarding development and update of comprehensive plan of care as evidenced by provider attestation and co-signature Inter-disciplinary care team collaboration (see longitudinal plan of care) Evaluation of current treatment plan related to  self management and patient's adherence to plan as established by provider Reached out to patient by telephone today but she was not at home. Spoke with patient's son, Tonya Carpenter, who is listed as an emergency contact.  Asked Tonya Carpenter to have [name] give me a call back and advised that I will follow-up with her with a week if she doesn't return my call Asked son if he was aware of any medical or resource needs. He was not at this time. Stated he's thankful that his mother is able to get around so good and is in such good health for her age. No  information was provided to son Appt note added to PCP appt in November to update DPR. Patient doesn't have anyone listed although her son and nephew are listed as emergency contacts.   Diabetes:  (Status:  Unable to speak with patient ) Lab Results  Component Value Date   HGBA1C 7.0 (H) 10/26/2020  Assessed patient's understanding of A1c goal: <7% Provided education to patient about basic DM disease process; Reviewed medications with patient and discussed importance of medication adherence;        Counseled on importance of regular laboratory monitoring as prescribed;        Discussed plans with patient for ongoing care management follow up and provided patient with direct contact information for care management team;      Advised patient, providing education and rationale, to check cbg daily and record        Review of patient status, including review of consultants reports, relevant laboratory and other test results, and medications completed;       Assessed social determinant of health barriers;         Hypertension: (Status:  unable to speak with patient )  Last practice recorded BP readings:  BP Readings from Last 3 Encounters:  10/26/20 (!) 111/59  07/21/20 111/66  04/07/20 132/66  Most recent eGFR/CrCl:  Lab Results  Component Value Date   EGFR 41 (L) 07/21/2020    No components found for: CRCL  Evaluation of current treatment plan related to hypertension self management and patient's adherence to plan as established by provider;   Provided education to patient re: stroke prevention, s/s of heart attack and stroke; Reviewed medications with patient and discussed importance of compliance;  Counseled on the importance of exercise goals with target of 150 minutes per week Discussed plans with patient for ongoing care management follow up and provided patient with direct contact information for care management team; Provided education on prescribed diet low sodium/DASH diet;   Discussed complications of poorly controlled blood pressure such as heart disease, stroke, circulatory complications, vision complications, kidney impairment, sexual dysfunction;   Patient Goals/Self-Care Activities: Patient will self administer medications as prescribed Patient will attend all scheduled provider appointments Patient will continue to perform ADL's independently Patient will continue to perform IADL's independently Patient will call provider office for new concerns or questions       Plan:The patient has been provided with contact information for the care management team and has been advised to call with any health related questions or concerns.     Chong Sicilian, BSN, RN-BC Embedded Chronic Care Manager [facility] / [place] Management Direct Dial: [phone]

## 2020-12-16 ENCOUNTER — Other Ambulatory Visit: Payer: Self-pay | Admitting: Family Medicine

## 2020-12-18 ENCOUNTER — Telehealth: Payer: Medicare HMO

## 2020-12-19 ENCOUNTER — Telehealth: Payer: Self-pay | Admitting: Family Medicine

## 2020-12-19 NOTE — Telephone Encounter (Signed)
Tried calling pt. No answer and no vmail. Pt has had both of her shingles vaccines. Not sure what booster she is speaking of. Pt does not have an appt here on 10/18 either. Pt does have a 77m follow up with [name] in November.

## 2020-12-20 ENCOUNTER — Other Ambulatory Visit: Payer: Self-pay | Admitting: Family Medicine

## 2020-12-20 DIAGNOSIS — E1159 Type 2 diabetes mellitus with other circulatory complications: Secondary | ICD-10-CM

## 2020-12-20 DIAGNOSIS — I152 Hypertension secondary to endocrine disorders: Secondary | ICD-10-CM

## 2020-12-26 NOTE — Telephone Encounter (Signed)
No answer, no voicemail, encounter closed 

## 2021-01-12 ENCOUNTER — Other Ambulatory Visit: Payer: Self-pay | Admitting: Family Medicine

## 2021-01-12 DIAGNOSIS — E1169 Type 2 diabetes mellitus with other specified complication: Secondary | ICD-10-CM

## 2021-01-23 ENCOUNTER — Ambulatory Visit (INDEPENDENT_AMBULATORY_CARE_PROVIDER_SITE_OTHER): Payer: Medicare HMO | Admitting: Licensed Clinical Social Worker

## 2021-01-23 DIAGNOSIS — I152 Hypertension secondary to endocrine disorders: Secondary | ICD-10-CM

## 2021-01-23 DIAGNOSIS — E785 Hyperlipidemia, unspecified: Secondary | ICD-10-CM

## 2021-01-23 DIAGNOSIS — I2510 Atherosclerotic heart disease of native coronary artery without angina pectoris: Secondary | ICD-10-CM

## 2021-01-23 DIAGNOSIS — M7541 Impingement syndrome of right shoulder: Secondary | ICD-10-CM

## 2021-01-23 DIAGNOSIS — K2100 Gastro-esophageal reflux disease with esophagitis, without bleeding: Secondary | ICD-10-CM

## 2021-01-23 DIAGNOSIS — E1169 Type 2 diabetes mellitus with other specified complication: Secondary | ICD-10-CM

## 2021-01-23 DIAGNOSIS — N183 Chronic kidney disease, stage 3 unspecified: Secondary | ICD-10-CM

## 2021-01-23 DIAGNOSIS — R42 Dizziness and giddiness: Secondary | ICD-10-CM

## 2021-01-23 NOTE — Patient Instructions (Addendum)
Visit Information  Patient Goals:  Protect My Health; Manage Anxiety and Stress issues faced Manage depression symptoms  Timeframe:  Short-Term Goal Priority:  Medium Progress: On Track Start Date:    01/23/21                        Expected End Date:                  04/23/21  Follow Up Date   03/23/21 at 11:15 AM     Protect My Health (Patient) Manage Anxiety and Stress issues faced . Manage depression symptoms   Why is this important?   Screening tests can find diseases early when they are easier to treat.  Your doctor or nurse will talk with you about which tests are important for you.  Getting shots for common diseases like the flu and shingles will help prevent them.      Patient Self Care Activities:  Self administers medications as prescribed Attends all scheduled provider appointments Performs ADL's independently  Patient Coping Strengths:  Family Friends  Patient Self Care Deficits:  Some pain issues Some mobility issues  Patient Goals:  - spend time or talk with others every day - practice relaxation or meditation daily - keep a calendar with appointment dates  Follow Up Plan: LCSW to call client on 03/23/21 at 11:15 AM to assess client needs   [name].Bogdan Vivona MSW, LCSW Licensed Clinical Social Worker [facility] Care Management [phone]

## 2021-01-23 NOTE — Chronic Care Management (AMB) (Signed)
Chronic Care Management    Clinical Social Work Note  01/23/2021 Name: Tonya Carpenter MRN: [id number] DOB: 04-20-1945  Tonya Carpenter is a 75 y.o. year old female who is a primary care patient of [name], Fransisca Kaufmann, MD. The CCM team was consulted to assist the patient with chronic disease management and/or care coordination needs related to: [name] .   Engaged with patient by telephone for follow up visit in response to provider referral for social work chronic care management and care coordination services.   Consent to Services:  The patient was given information about Chronic Care Management services, agreed to services, and gave verbal consent prior to initiation of services.  Please see initial visit note for detailed documentation.   Patient agreed to services and consent obtained.   Assessment: Review of patient past medical history, allergies, medications, and health status, including review of relevant consultants reports was performed today as part of a comprehensive evaluation and provision of chronic care management and care coordination services.     SDOH (Social Determinants of Health) assessments and interventions performed:  SDOH Interventions    Flowsheet Row Most Recent Value  SDOH Interventions   Physical Activity Interventions Other (Comments)  [Has walking challenges. Uses a cane to help her walk]  Stress Interventions Provide Counseling  [client has stress related to managing medical needs]  Depression Interventions/Treatment  Counseling        Advanced Directives Status: See Vynca application for related entries.  CCM Care Plan  Allergies  Allergen Reactions   Trilyte [Peg 3350-Kcl-Na Bicarb-Nacl]     Nausea/vomtiing    Outpatient Encounter Medications as of 01/23/2021  Medication Sig   ACCU-CHEK GUIDE test strip TEST BLOOD SUGAR TWICE DAILY AND AS NEEDED FOR DIABETES   Accu-Chek Softclix Lancets lancets TEST BLOOD SUGAR TWICE DAILY AND AS  NEEDED FOR DIABETES   Alcohol Swabs (DROPSAFE ALCOHOL PREP) 70 % PADS USE  TO CHECK BLOOD SUGAR TWICE DAILY  AND AS NEEDED   atorvastatin (LIPITOR) 20 MG tablet TAKE 1 TABLET EVERY DAY   Blood Glucose Monitoring Suppl (ACCU-CHEK GUIDE) w/Device KIT USE AS DIRECTED FOR DIABETES   acetaminophen (TYLENOL) 500 MG tablet Take 1,000 mg by mouth every 6 (six) hours as needed for mild pain or headache.    aspirin (ASPIRIN LOW DOSE) 81 MG EC tablet Take 1 tablet (81 mg total) by mouth daily. Swallow whole.   Blood Glucose Calibration (TRUE METRIX LEVEL 1) Low SOLN Use as needed with BS machine   cholecalciferol (VITAMIN D) 25 MCG (1000 UNIT) tablet Take 1 tablet (1,000 Units total) by mouth daily.   diclofenac Sodium (VOLTAREN) 1 % GEL Apply 2 g topically 4 (four) times daily as needed.   hydrALAZINE (APRESOLINE) 10 MG tablet Take 1 tablet (10 mg total) by mouth 2 (two) times daily.   JANUMET XR 50-500 MG TB24 TAKE 1 TABLET TWICE DAILY   Lancet Devices (TRUEDRAW LANCING DEVICE) MISC Teset BS BID Dx E11.9   losartan-hydrochlorothiazide (HYZAAR) 50-12.5 MG tablet TAKE 1 TABLET EVERY DAY   metoprolol succinate (TOPROL-XL) 50 MG 24 hr tablet TAKE 1 TABLET (50 MG TOTAL) BY MOUTH DAILY. TAKE WITH OR IMMEDIATELY FOLLOWING A MEAL.   nitroGLYCERIN (NITROSTAT) 0.4 MG SL tablet DISSOLVE 1 TABLET UNDER THE TONGUE EVERY 5 MINUTES AS NEEDED FOR CHEST PAIN   omeprazole (PRILOSEC) 40 MG capsule Take 1 capsule (40 mg total) by mouth daily.   polyethylene glycol (MIRALAX / GLYCOLAX) 17 g packet Take 17  g by mouth daily as needed.   Prodigy Twist Top Lancets 28G MISC TEST BLOOD SUGAR TWICE DAILY Dx E11.9   TRUEplus Lancets 30G MISC Teset BS BID Dx E11.9   No facility-administered encounter medications on file as of 01/23/2021.    Patient Active Problem List   Diagnosis Date Noted   CKD stage 3 due to type 2 diabetes mellitus ([place]) 04/07/2020   LBBB (left bundle branch block) 07/20/2019   Dizziness 04/22/2018    Shoulder impingement syndrome, right 12/02/2017   GERD (gastroesophageal reflux disease) 10/20/2013   Frequency 02/18/2013   Coronary atherosclerosis of native coronary artery 01/14/2013   CAD, multiple vessel 07/10/2012   CARDIAC MURMUR 07/03/2009   Type 2 diabetes mellitus ([place]) 09/12/2008   Hyperlipidemia associated with type 2 diabetes mellitus ([place]) 09/12/2008   Hypertension associated with diabetes ([place]) 09/12/2008    Conditions to be addressed/monitored: monitor client management of anxiety and stress issues  Care Plan : LCSW Care Plan  Updates made by Katha Cabal, LCSW since 01/23/2021 12:00 AM     Problem: Anxiety Identification (Anxiety)      Goal: manage anxiety and stress symptoms. Manage depression symptoms   Start Date: 01/23/2021  Expected End Date: 04/23/2021  This Visit's Progress: On track  Recent Progress: On track  Priority: Medium  Note:   Current Barriers:  Chronic Mental Health needs related to management of anxiety and stress issues Mobility issues Pain Issues Challenges in completing ADLs Suicidal Ideation/Homicidal Ideation: No  Clinical Social Work Goal(s):  patient will work with SW monthly by telephone or in person to reduce or manage symptoms related to anxiety and stress issues faced patient will communicate with RNCM or LCSW as needed in next 30 days for CCM support Patient will communicate with local DSS Medicaid caseworker as needed to determine client eligibility for Medicaid  Interventions: 1:1 collaboration with [name], Fransisca Kaufmann, MD regarding development and update of comprehensive plan of care as evidenced by provider attestation and co-signature Discussed pain issues with client. Discussed mobility issues of client. Client said she uses a cane , as needed, to help her walk Discussed sleeping issues of client Reviewed upcoming medical appointments with client Reviewed medication procurement for client Encouraged client to call  RNCM as needed for CCM nursing support Discussed client support from cardiologist She said she has an appointment next Spring with cardiologist.  Reviewed with client her food intake and appetite. She said she has a reduced appetite. Discussed client use of [employer] bus for transport assist Discussed client use of [employer] Lucianne Lei as needed for transport assistance. Discussed Food Stamps benefit with client Reviewed relaxation techniques with client.. She likes to read the Bible, she likes to go to church regularly, she watches TV, and enjoys going to local businesses to shop.  Patient Self Care Activities:  Self administers medications as prescribed Attends all scheduled provider appointments Performs ADL's independently  Patient Coping Strengths:  Family Friends  Patient Self Care Deficits:  Some pain issues Some mobility issues  Patient Goals:  - spend time or talk with others every day - practice relaxation or meditation daily - keep a calendar with appointment dates  Follow Up Plan: LCSW to call client on 03/23/21 at 11:15 AM to assess client needs      [name].Woodrow Drab MSW, LCSW Licensed Clinical Social Worker [facility] Care Management [phone]

## 2021-01-25 ENCOUNTER — Other Ambulatory Visit: Payer: Self-pay

## 2021-01-25 ENCOUNTER — Ambulatory Visit (INDEPENDENT_AMBULATORY_CARE_PROVIDER_SITE_OTHER): Payer: Medicare HMO | Admitting: Family Medicine

## 2021-01-25 ENCOUNTER — Encounter: Payer: Self-pay | Admitting: Family Medicine

## 2021-01-25 VITALS — BP 148/81 | HR 58 | Ht 63.0 in | Wt 146.0 lb

## 2021-01-25 DIAGNOSIS — E785 Hyperlipidemia, unspecified: Secondary | ICD-10-CM

## 2021-01-25 DIAGNOSIS — I152 Hypertension secondary to endocrine disorders: Secondary | ICD-10-CM | POA: Diagnosis not present

## 2021-01-25 DIAGNOSIS — N1832 Chronic kidney disease, stage 3b: Secondary | ICD-10-CM | POA: Diagnosis not present

## 2021-01-25 DIAGNOSIS — E1169 Type 2 diabetes mellitus with other specified complication: Secondary | ICD-10-CM | POA: Diagnosis not present

## 2021-01-25 DIAGNOSIS — E1159 Type 2 diabetes mellitus with other circulatory complications: Secondary | ICD-10-CM

## 2021-01-25 DIAGNOSIS — E1122 Type 2 diabetes mellitus with diabetic chronic kidney disease: Secondary | ICD-10-CM

## 2021-01-25 DIAGNOSIS — N183 Chronic kidney disease, stage 3 unspecified: Secondary | ICD-10-CM | POA: Diagnosis not present

## 2021-01-25 LAB — BAYER DCA HB A1C WAIVED: HB A1C (BAYER DCA - WAIVED): 6.6 % — ABNORMAL HIGH (ref 4.8–5.6)

## 2021-01-25 MED ORDER — JANUMET XR 50-500 MG PO TB24: 1.0000 | ORAL_TABLET | Freq: Two times a day (BID) | ORAL | 3 refills | Status: DC

## 2021-01-25 MED ORDER — METOPROLOL SUCCINATE ER 50 MG PO TB24: 50.0000 mg | ORAL_TABLET | Freq: Every day | ORAL | 3 refills | Status: DC

## 2021-01-25 MED ORDER — ATORVASTATIN CALCIUM 20 MG PO TABS
20.0000 mg | ORAL_TABLET | Freq: Every day | ORAL | 3 refills | Status: DC
Start: 2021-01-25 — End: 2021-09-14

## 2021-01-25 MED ORDER — LOSARTAN POTASSIUM-HCTZ 50-12.5 MG PO TABS: 1.0000 | ORAL_TABLET | Freq: Every day | ORAL | 3 refills | Status: DC

## 2021-01-25 NOTE — Progress Notes (Signed)
BP (!) 148/81   Pulse (!) 58   Ht 5' 3"  (1.6 m)   Wt 146 lb (66.2 kg)   SpO2 97%   BMI 25.86 kg/m    Subjective:   Patient ID: Tonya Carpenter, female    DOB: 07-12-1945, 75 y.o.   MRN: [id number]  HPI: Tonya Carpenter is a 75 y.o. female presenting on 01/25/2021 for Medical Management of Chronic Issues, Diabetes, and Hypertension   HPI Type 2 diabetes mellitus Patient comes in today for recheck of his diabetes. Patient has been currently taking Janumet and A1c looks better at 6.6. Patient is currently on an ACE inhibitor/ARB. Patient has seen an ophthalmologist this year. Patient denies any issues with their feet. The symptom started onset as an adult hypertension and hyperlipidemia ARE RELATED TO DM   Hypertension Patient is currently on losartan hydrochlorothiazide and metoprolol, and their blood pressure today is 148/81. Patient denies any lightheadedness or dizziness. Patient denies headaches, blurred vision, chest pains, shortness of breath, or weakness. Denies any side effects from medication and is content with current medication.   Hyperlipidemia Patient is coming in for recheck of his hyperlipidemia. The patient is currently taking atorvastatin. They deny any issues with myalgias or history of liver damage from it. They deny any focal numbness or weakness or chest pain.   Relevant past medical, surgical, family and social history reviewed and updated as indicated. Interim medical history since our last visit reviewed. Allergies and medications reviewed and updated.  Review of Systems  Constitutional:  Negative for chills and fever.  Eyes:  Negative for visual disturbance.  Respiratory:  Negative for chest tightness and shortness of breath.   Cardiovascular:  Negative for chest pain and leg swelling.  Musculoskeletal:  Negative for back pain and gait problem.  Skin:  Negative for rash.  Neurological:  Negative for light-headedness and headaches.  Psychiatric/Behavioral:   Negative for agitation and behavioral problems.   All other systems reviewed and are negative.  Per HPI unless specifically indicated above   Allergies as of 01/25/2021       Reactions   Trilyte [peg 3350-kcl-na Bicarb-nacl]    Nausea/vomtiing        Medication List        Accurate as of January 25, 2021 11:40 AM. If you have any questions, ask your nurse or doctor.          Accu-Chek Guide test strip Generic drug: glucose blood TEST BLOOD SUGAR TWICE DAILY AND AS NEEDED FOR DIABETES   Accu-Chek Guide w/Device Kit USE AS DIRECTED FOR DIABETES   acetaminophen 500 MG tablet Commonly known as: TYLENOL Take 1,000 mg by mouth every 6 (six) hours as needed for mild pain or headache.   aspirin 81 MG EC tablet Commonly known as: Aspirin Low Dose Take 1 tablet (81 mg total) by mouth daily. Swallow whole.   atorvastatin 20 MG tablet Commonly known as: LIPITOR Take 1 tablet (20 mg total) by mouth daily.   cholecalciferol 25 MCG (1000 UNIT) tablet Commonly known as: VITAMIN D Take 1 tablet (1,000 Units total) by mouth daily.   diclofenac Sodium 1 % Gel Commonly known as: VOLTAREN Apply 2 g topically 4 (four) times daily as needed.   DropSafe Alcohol Prep 70 % Pads USE  TO CHECK BLOOD SUGAR TWICE DAILY  AND AS NEEDED   hydrALAZINE 10 MG tablet Commonly known as: APRESOLINE Take 1 tablet (10 mg total) by mouth 2 (two) times daily.  Janumet XR 50-500 MG Tb24 Generic drug: SitaGLIPtin-MetFORMIN HCl Take 1 tablet by mouth 2 (two) times daily.   losartan-hydrochlorothiazide 50-12.5 MG tablet Commonly known as: HYZAAR Take 1 tablet by mouth daily.   metoprolol succinate 50 MG 24 hr tablet Commonly known as: TOPROL-XL Take 1 tablet (50 mg total) by mouth daily. Take with or immediately following a meal.   nitroGLYCERIN 0.4 MG SL tablet Commonly known as: NITROSTAT DISSOLVE 1 TABLET UNDER THE TONGUE EVERY 5 MINUTES AS NEEDED FOR CHEST PAIN   omeprazole 40 MG  capsule Commonly known as: PRILOSEC Take 1 capsule (40 mg total) by mouth daily.   polyethylene glycol 17 g packet Commonly known as: MIRALAX / GLYCOLAX Take 17 g by mouth daily as needed.   True Metrix Level 1 Low Soln Use as needed with BS machine   TRUEdraw Lancing Device Misc Teset BS BID Dx E11.9   TRUEplus Lancets 30G Misc Teset BS BID Dx E11.9   Prodigy Twist Top Lancets 28G Misc TEST BLOOD SUGAR TWICE DAILY Dx E11.9   Accu-Chek Softclix Lancets lancets TEST BLOOD SUGAR TWICE DAILY AND AS NEEDED FOR DIABETES         Objective:   BP (!) 148/81   Pulse (!) 58   Ht 5' 3"  (1.6 m)   Wt 146 lb (66.2 kg)   SpO2 97%   BMI 25.86 kg/m   Wt Readings from Last 3 Encounters:  01/25/21 146 lb (66.2 kg)  10/26/20 150 lb (68 kg)  06/27/20 160 lb (72.6 kg)    Physical Exam Vitals and nursing note reviewed.  Constitutional:      General: She is not in acute distress.    Appearance: She is well-developed. She is not diaphoretic.  Eyes:     Conjunctiva/sclera: Conjunctivae normal.  Cardiovascular:     Rate and Rhythm: Normal rate and regular rhythm.     Heart sounds: Normal heart sounds. No murmur heard. Pulmonary:     Effort: Pulmonary effort is normal. No respiratory distress.     Breath sounds: Normal breath sounds. No wheezing.  Musculoskeletal:        General: No tenderness. Normal range of motion.  Skin:    General: Skin is warm and dry.     Findings: No rash.  Neurological:     Mental Status: She is alert and oriented to person, place, and time.     Coordination: Coordination normal.  Psychiatric:        Behavior: Behavior normal.      Assessment & Plan:   Problem List Items Addressed This Visit       Cardiovascular and Mediastinum   Hypertension associated with diabetes ([place])   Relevant Medications   losartan-hydrochlorothiazide (HYZAAR) 50-12.5 MG tablet   JANUMET XR 50-500 MG TB24   atorvastatin (LIPITOR) 20 MG tablet   metoprolol succinate  (TOPROL-XL) 50 MG 24 hr tablet   Other Relevant Orders   CBC with Differential/Platelet   CMP14+EGFR   Lipid panel   Bayer DCA Hb A1c Waived (Completed)     Endocrine   Type 2 diabetes mellitus (HCC) - Primary   Relevant Medications   losartan-hydrochlorothiazide (HYZAAR) 50-12.5 MG tablet   JANUMET XR 50-500 MG TB24   atorvastatin (LIPITOR) 20 MG tablet   Other Relevant Orders   CBC with Differential/Platelet   CMP14+EGFR   Lipid panel   Bayer DCA Hb A1c Waived (Completed)   Hyperlipidemia associated with type 2 diabetes mellitus (HCC)   Relevant Medications  losartan-hydrochlorothiazide (HYZAAR) 50-12.5 MG tablet   JANUMET XR 50-500 MG TB24   atorvastatin (LIPITOR) 20 MG tablet   metoprolol succinate (TOPROL-XL) 50 MG 24 hr tablet   Other Relevant Orders   CBC with Differential/Platelet   CMP14+EGFR   Lipid panel   Bayer DCA Hb A1c Waived (Completed)    A1c improved at 6.6.  Continue current medicine.  She does get a little bit of diarrhea from the metformin but nothing else major.  Patient wants a letter trying to see if her son Follow up plan: Return in about 3 months (around 04/27/2021), or if symptoms worsen or fail to improve, for Diabetes and hypertension and cholesterol.  Counseling provided for all of the vaccine components Orders Placed This Encounter  Procedures   CBC with Differential/Platelet   CMP14+EGFR   Lipid panel   Bayer DCA Hb A1c [place], MD [facility] 01/25/2021, 11:40 AM

## 2021-01-26 LAB — CMP14+EGFR
ALT: 9 IU/L (ref 0–32)
AST: 14 IU/L (ref 0–40)
Albumin/Globulin Ratio: 1 — ABNORMAL LOW (ref 1.2–2.2)
Albumin: 3.8 g/dL (ref 3.7–4.7)
Alkaline Phosphatase: 54 IU/L (ref 44–121)
BUN/Creatinine Ratio: 14 (ref 12–28)
BUN: 18 mg/dL (ref 8–27)
Bilirubin Total: 0.3 mg/dL (ref 0.0–1.2)
CO2: 25 mmol/L (ref 20–29)
Calcium: 9.5 mg/dL (ref 8.7–10.3)
Chloride: 103 mmol/L (ref 96–106)
Creatinine, Ser: 1.31 mg/dL — ABNORMAL HIGH (ref 0.57–1.00)
Globulin, Total: 3.8 g/dL (ref 1.5–4.5)
Glucose: 67 mg/dL — ABNORMAL LOW (ref 70–99)
Potassium: 4 mmol/L (ref 3.5–5.2)
Sodium: 140 mmol/L (ref 134–144)
Total Protein: 7.6 g/dL (ref 6.0–8.5)
eGFR: 43 mL/min/{1.73_m2} — ABNORMAL LOW (ref >59)

## 2021-01-26 LAB — CBC WITH DIFFERENTIAL/PLATELET
Basophils Absolute: 0 10*3/uL (ref 0.0–0.2)
Basos: 0 %
EOS (ABSOLUTE): 0.1 10*3/uL (ref 0.0–0.4)
Eos: 1 %
Hematocrit: 33.8 % — ABNORMAL LOW (ref 34.0–46.6)
Hemoglobin: 10.8 g/dL — ABNORMAL LOW (ref 11.1–15.9)
Immature Grans (Abs): 0 10*3/uL (ref 0.0–0.1)
Immature Granulocytes: 0 %
Lymphocytes Absolute: 3 10*3/uL (ref 0.7–3.1)
Lymphs: 36 %
MCH: 26.7 pg (ref 26.6–33.0)
MCHC: 32 g/dL (ref 31.5–35.7)
MCV: 84 fL (ref 79–97)
Monocytes Absolute: 0.5 10*3/uL (ref 0.1–0.9)
Monocytes: 6 %
Neutrophils Absolute: 4.8 10*3/uL (ref 1.4–7.0)
Neutrophils: 57 %
Platelets: 253 10*3/uL (ref 150–450)
RBC: 4.04 x10E6/uL (ref 3.77–5.28)
RDW: 12.2 % (ref 11.7–15.4)
WBC: 8.4 10*3/uL (ref 3.4–10.8)

## 2021-01-26 LAB — LIPID PANEL
Chol/HDL Ratio: 2.7 ratio (ref 0.0–4.4)
Cholesterol, Total: 125 mg/dL (ref 100–199)
HDL: 47 mg/dL (ref >39)
LDL Chol Calc (NIH): 61 mg/dL (ref 0–99)
Triglycerides: 86 mg/dL (ref 0–149)
VLDL Cholesterol Cal: 17 mg/dL (ref 5–40)

## 2021-02-14 DIAGNOSIS — I152 Hypertension secondary to endocrine disorders: Secondary | ICD-10-CM

## 2021-02-14 DIAGNOSIS — I2510 Atherosclerotic heart disease of native coronary artery without angina pectoris: Secondary | ICD-10-CM

## 2021-02-14 DIAGNOSIS — E1169 Type 2 diabetes mellitus with other specified complication: Secondary | ICD-10-CM

## 2021-02-14 DIAGNOSIS — N183 Chronic kidney disease, stage 3 unspecified: Secondary | ICD-10-CM

## 2021-02-14 DIAGNOSIS — E1159 Type 2 diabetes mellitus with other circulatory complications: Secondary | ICD-10-CM

## 2021-02-14 DIAGNOSIS — E785 Hyperlipidemia, unspecified: Secondary | ICD-10-CM

## 2021-02-14 DIAGNOSIS — E1122 Type 2 diabetes mellitus with diabetic chronic kidney disease: Secondary | ICD-10-CM

## 2021-03-03 ENCOUNTER — Other Ambulatory Visit: Payer: Self-pay | Admitting: Family Medicine

## 2021-03-03 DIAGNOSIS — N183 Chronic kidney disease, stage 3 unspecified: Secondary | ICD-10-CM

## 2021-03-03 DIAGNOSIS — E1159 Type 2 diabetes mellitus with other circulatory complications: Secondary | ICD-10-CM

## 2021-03-16 ENCOUNTER — Other Ambulatory Visit: Payer: Self-pay | Admitting: *Deleted

## 2021-03-16 MED ORDER — POLYETHYLENE GLYCOL 3350 17 G PO PACK: 17.0000 g | PACK | Freq: Every day | ORAL | 1 refills | Status: DC | PRN

## 2021-03-16 MED ORDER — ASPIRIN 81 MG PO TBEC: 81.0000 mg | DELAYED_RELEASE_TABLET | Freq: Every day | ORAL | 1 refills | Status: DC

## 2021-03-23 ENCOUNTER — Ambulatory Visit (INDEPENDENT_AMBULATORY_CARE_PROVIDER_SITE_OTHER): Payer: Medicare HMO | Admitting: Licensed Clinical Social Worker

## 2021-03-23 DIAGNOSIS — K2100 Gastro-esophageal reflux disease with esophagitis, without bleeding: Secondary | ICD-10-CM

## 2021-03-23 DIAGNOSIS — M7541 Impingement syndrome of right shoulder: Secondary | ICD-10-CM

## 2021-03-23 DIAGNOSIS — N183 Chronic kidney disease, stage 3 unspecified: Secondary | ICD-10-CM

## 2021-03-23 DIAGNOSIS — E1122 Type 2 diabetes mellitus with diabetic chronic kidney disease: Secondary | ICD-10-CM

## 2021-03-23 DIAGNOSIS — R42 Dizziness and giddiness: Secondary | ICD-10-CM

## 2021-03-23 DIAGNOSIS — E785 Hyperlipidemia, unspecified: Secondary | ICD-10-CM

## 2021-03-23 DIAGNOSIS — E1169 Type 2 diabetes mellitus with other specified complication: Secondary | ICD-10-CM

## 2021-03-23 DIAGNOSIS — E1159 Type 2 diabetes mellitus with other circulatory complications: Secondary | ICD-10-CM

## 2021-03-23 DIAGNOSIS — I2510 Atherosclerotic heart disease of native coronary artery without angina pectoris: Secondary | ICD-10-CM

## 2021-03-23 NOTE — Chronic Care Management (AMB) (Signed)
°Chronic Care Management  ° ° Clinical Social Work Note ° °03/23/2021 °Name: Tonya Carpenter MRN: [id number] DOB: 06/02/1945 ° °Tonya Carpenter is a 76 y.o. year old female who is a primary care patient of [name], Joshua A, MD. The CCM team was consulted to assist the patient with chronic disease management and/or care coordination needs related to: [employer] .  ° °Engaged with patient by telephone for follow up visit in response to provider referral for social work chronic care management and care coordination services.  ° °Consent to Services:  °The patient was given information about Chronic Care Management services, agreed to services, and gave verbal consent prior to initiation of services.  Please see initial visit note for detailed documentation.  ° °Patient agreed to services and consent obtained.  ° °Assessment: Review of patient past medical history, allergies, medications, and health status, including review of relevant consultants reports was performed today as part of a comprehensive evaluation and provision of chronic care management and care coordination services.    ° °SDOH (Social Determinants of Health) assessments and interventions performed:  °SDOH Interventions   ° °Flowsheet Row Most Recent Value  °SDOH Interventions   °Physical Activity Interventions Other (Comments)  [walking challenges. uses a cane to help her walk]  °Stress Interventions Provide Counseling  [client has stress related to managing medical needs.]  °Depression Interventions/Treatment  Counseling  ° °  °  ° °Advanced Directives Status: See Vynca application for related entries. ° °CCM Care Plan ° °Allergies  °Allergen Reactions  ° Trilyte [Peg 3350-Kcl-Na Bicarb-Nacl]   °  Nausea/vomtiing  ° ° °Outpatient Encounter Medications as of 03/23/2021  °Medication Sig  ° ACCU-CHEK GUIDE test strip TEST BLOOD SUGAR TWICE DAILY AND AS NEEDED FOR DIABETES  ° Accu-Chek Softclix Lancets lancets TEST BLOOD SUGAR TWICE DAILY AND AS NEEDED FOR  DIABETES  ° acetaminophen (TYLENOL) 500 MG tablet Take 1,000 mg by mouth every 6 (six) hours as needed for mild pain or headache.   ° Alcohol Swabs (DROPSAFE ALCOHOL PREP) 70 % PADS USE  TO CHECK BLOOD SUGAR TWICE DAILY  AND AS NEEDED  ° aspirin (ASPIRIN LOW DOSE) 81 MG EC tablet Take 1 tablet (81 mg total) by mouth daily. Swallow whole.  ° atorvastatin (LIPITOR) 20 MG tablet Take 1 tablet (20 mg total) by mouth daily.  ° Blood Glucose Calibration (TRUE METRIX LEVEL 1) Low SOLN Use as needed with BS machine  ° Blood Glucose Monitoring Suppl (ACCU-CHEK GUIDE) w/Device KIT USE AS DIRECTED FOR DIABETES  ° cholecalciferol (VITAMIN D) 25 MCG (1000 UNIT) tablet Take 1 tablet (1,000 Units total) by mouth daily.  ° diclofenac Sodium (VOLTAREN) 1 % GEL Apply 2 g topically 4 (four) times daily as needed.  ° hydrALAZINE (APRESOLINE) 10 MG tablet TAKE 1 TABLET TWICE DAILY  ° JANUMET XR 50-500 MG TB24 Take 1 tablet by mouth 2 (two) times daily.  ° Lancet Devices (TRUEDRAW LANCING DEVICE) MISC Teset BS BID Dx E11.9  ° losartan-hydrochlorothiazide (HYZAAR) 50-12.5 MG tablet Take 1 tablet by mouth daily.  ° metoprolol succinate (TOPROL-XL) 50 MG 24 hr tablet Take 1 tablet (50 mg total) by mouth daily. Take with or immediately following a meal.  ° nitroGLYCERIN (NITROSTAT) 0.4 MG SL tablet DISSOLVE 1 TABLET UNDER THE TONGUE EVERY 5 MINUTES AS NEEDED FOR CHEST PAIN  ° omeprazole (PRILOSEC) 40 MG capsule Take 1 capsule (40 mg total) by mouth daily.  ° polyethylene glycol (MIRALAX / GLYCOLAX) 17 g packet Take   17 g by mouth daily as needed.  ° Prodigy Twist Top Lancets 28G MISC TEST BLOOD SUGAR TWICE DAILY Dx E11.9  ° TRUEplus Lancets 30G MISC Teset BS BID Dx E11.9  ° °No facility-administered encounter medications on file as of 03/23/2021.  ° ° °Patient Active Problem List  ° Diagnosis Date Noted  ° CKD stage 3 due to type 2 diabetes mellitus ([facility]) 04/07/2020  ° LBBB (left bundle branch block) 07/20/2019  ° GERD (gastroesophageal reflux  disease) 10/20/2013  ° Coronary atherosclerosis of native coronary artery 01/14/2013  ° CARDIAC MURMUR 07/03/2009  ° Type 2 diabetes mellitus ([facility]) 09/12/2008  ° Hyperlipidemia associated with type 2 diabetes mellitus ([facility]) 09/12/2008  ° Hypertension associated with diabetes ([facility]) 09/12/2008  ° ° °Conditions to be addressed/monitored: monitor client management of anxiety issues and of stress issues faced ° °Care Plan : LCSW Care Plan  °Updates made by ,  S, LCSW since 03/23/2021 12:00 AM  °  ° °Problem: Anxiety Identification (Anxiety)   °  ° °Goal: manage anxiety and stress symptoms. Manage depression symptoms   °Start Date: 03/23/2021  °Expected End Date: 06/20/2021  °This Visit's Progress: On track  °Recent Progress: On track  °Priority: Medium  °Note:   °Current Barriers:  °Chronic Mental Health needs related to management of anxiety and stress issues °Mobility issues °Pain Issues °Depression issues.  °Suicidal Ideation/Homicidal Ideation: No ° °Clinical Social Work Goal(s):  °patient will work with SW monthly by telephone or in person to reduce or manage symptoms related to anxiety and stress issues faced °patient will communicate with RNCM or LCSW as needed in next 30 days for CCM support °Patient will communicate with local DSS Medicaid caseworker as needed to determine client eligibility for Medicaid ° °Interventions: °1:1 collaboration with [name], Joshua A, MD regarding development and update of comprehensive plan of care as evidenced by provider attestation and co-signature °Discussed pain issues with client. Discussed mobility issues of client. Client said she uses a cane , as needed, to help her walk °Discussed sleeping issues of client.  Client said sometimes she has reduced sleep °Reviewed medication procurement for client °Encouraged client to call RNCM as needed for CCM nursing support °Discussed client support from cardiologist She said she has an appointment this Spring with  cardiologist.  °Discussed client use of [employer] bus for transport assist °Discussed client use of [employer] van as needed for transport assistance. °Discussed Food Stamps benefit with client. Client said she does get a Food Stamps benefit each month and that using Food Stamps benefit is helpful to her °Discussed family support. Tailor said she has support from her son Andre. She said she also has support from her son, Travis °Discussed client communication with DSS Medicaid caseworker. Berlene said that DSS caseworker has told Kizzi that she earns too much at this time to qualify for Medicaid. ° °Patient Self Care Activities:  °Self administers medications as prescribed °Attends all scheduled provider appointments °Performs ADL's independently ° °Patient Coping Strengths:  °Family °Friends ° °Patient Self Care Deficits:  °Some pain issues °Some mobility issues ° °Patient Goals:  °- spend time or talk with others every day °- practice relaxation or meditation daily °- keep a calendar with appointment dates ° °Follow Up Plan: LCSW to call client on 05/16/21 at 9:00 AM to assess client needs at that time.  °  °  ° S. MSW, LCSW °Licensed Clinical Social Worker °[facility] °[phone]  ° °

## 2021-03-23 NOTE — Patient Instructions (Addendum)
Visit Information  Patient Goals:  Protect My Health (Patient). Manage Anxiety and Stress issues faced. Manage Depression symptoms.  Timeframe:  Short-Term Goal Priority:  Medium Progress: On Track Start Date:    03/23/21                        Expected End Date:                  06/20/21  Follow Up Date    05/16/21 at 9:00 AM     Protect My Health (Patient) Manage Anxiety and Stress issues faced . Manage depression symptoms   Why is this important?   Screening tests can find diseases early when they are easier to treat.  Your doctor or nurse will talk with you about which tests are important for you.  Getting shots for common diseases like the flu and shingles will help prevent them.      Patient Self Care Activities:  Self administers medications as prescribed Attends all scheduled provider appointments Performs ADL's independently  Patient Coping Strengths:  Family Friends  Patient Self Care Deficits:  Some pain issues Some mobility issues  Patient Goals:  - spend time or talk with others every day - practice relaxation or meditation daily - keep a calendar with appointment dates  Follow Up Plan: LCSW to call client on 05/16/21 at 9:00 AM to assess client needs  at that time.  [name].Zaraya Delauder MSW, LCSW Licensed Clinical Social Worker [facility] Care Management [phone]

## 2021-03-30 ENCOUNTER — Other Ambulatory Visit: Payer: Self-pay | Admitting: Family Medicine

## 2021-03-30 DIAGNOSIS — K2100 Gastro-esophageal reflux disease with esophagitis, without bleeding: Secondary | ICD-10-CM

## 2021-04-09 ENCOUNTER — Other Ambulatory Visit: Payer: Self-pay | Admitting: Family Medicine

## 2021-04-09 DIAGNOSIS — Z1231 Encounter for screening mammogram for malignant neoplasm of breast: Secondary | ICD-10-CM

## 2021-04-11 ENCOUNTER — Telehealth: Payer: Self-pay

## 2021-04-11 NOTE — Telephone Encounter (Signed)
In talking with pt's son, Tonya Carpenter, at his AWV visit today, he states that his mother is in need of assistance at home. Feliz Beam asks if there is any way for his mom to be able to have a sitter come to her home? I told him that I would need to ask you if a referral can be done. Thank you!

## 2021-04-17 DIAGNOSIS — I152 Hypertension secondary to endocrine disorders: Secondary | ICD-10-CM

## 2021-04-17 DIAGNOSIS — E1122 Type 2 diabetes mellitus with diabetic chronic kidney disease: Secondary | ICD-10-CM

## 2021-04-17 DIAGNOSIS — E1159 Type 2 diabetes mellitus with other circulatory complications: Secondary | ICD-10-CM

## 2021-04-17 DIAGNOSIS — E785 Hyperlipidemia, unspecified: Secondary | ICD-10-CM

## 2021-04-17 DIAGNOSIS — N183 Chronic kidney disease, stage 3 unspecified: Secondary | ICD-10-CM

## 2021-04-17 DIAGNOSIS — E1169 Type 2 diabetes mellitus with other specified complication: Secondary | ICD-10-CM

## 2021-04-17 DIAGNOSIS — I2510 Atherosclerotic heart disease of native coronary artery without angina pectoris: Secondary | ICD-10-CM

## 2021-04-26 ENCOUNTER — Ambulatory Visit (INDEPENDENT_AMBULATORY_CARE_PROVIDER_SITE_OTHER): Payer: Medicare HMO | Admitting: Family Medicine

## 2021-04-26 ENCOUNTER — Encounter: Payer: Self-pay | Admitting: Family Medicine

## 2021-04-26 VITALS — BP 122/64 | HR 95 | Temp 96.8°F | Resp 20 | Ht 63.0 in | Wt 145.0 lb

## 2021-04-26 DIAGNOSIS — E1122 Type 2 diabetes mellitus with diabetic chronic kidney disease: Secondary | ICD-10-CM | POA: Diagnosis not present

## 2021-04-26 DIAGNOSIS — E785 Hyperlipidemia, unspecified: Secondary | ICD-10-CM | POA: Diagnosis not present

## 2021-04-26 DIAGNOSIS — N183 Chronic kidney disease, stage 3 unspecified: Secondary | ICD-10-CM

## 2021-04-26 DIAGNOSIS — K2100 Gastro-esophageal reflux disease with esophagitis, without bleeding: Secondary | ICD-10-CM | POA: Diagnosis not present

## 2021-04-26 DIAGNOSIS — I152 Hypertension secondary to endocrine disorders: Secondary | ICD-10-CM | POA: Diagnosis not present

## 2021-04-26 DIAGNOSIS — E1169 Type 2 diabetes mellitus with other specified complication: Secondary | ICD-10-CM

## 2021-04-26 DIAGNOSIS — Z550 Illiteracy and low-level literacy: Secondary | ICD-10-CM | POA: Diagnosis not present

## 2021-04-26 DIAGNOSIS — E1159 Type 2 diabetes mellitus with other circulatory complications: Secondary | ICD-10-CM

## 2021-04-26 LAB — CMP14+EGFR
ALT: 10 IU/L (ref 0–32)
AST: 13 IU/L (ref 0–40)
Albumin/Globulin Ratio: 1.3 (ref 1.2–2.2)
Albumin: 4.3 g/dL (ref 3.7–4.7)
Alkaline Phosphatase: 56 IU/L (ref 44–121)
BUN/Creatinine Ratio: 10 — ABNORMAL LOW (ref 12–28)
BUN: 13 mg/dL (ref 8–27)
Bilirubin Total: 0.5 mg/dL (ref 0.0–1.2)
CO2: 27 mmol/L (ref 20–29)
Calcium: 10 mg/dL (ref 8.7–10.3)
Chloride: 102 mmol/L (ref 96–106)
Creatinine, Ser: 1.29 mg/dL — ABNORMAL HIGH (ref 0.57–1.00)
Globulin, Total: 3.3 g/dL (ref 1.5–4.5)
Glucose: 120 mg/dL — ABNORMAL HIGH (ref 70–99)
Potassium: 3.4 mmol/L — ABNORMAL LOW (ref 3.5–5.2)
Sodium: 142 mmol/L (ref 134–144)
Total Protein: 7.6 g/dL (ref 6.0–8.5)
eGFR: 44 mL/min/{1.73_m2} — ABNORMAL LOW (ref >59)

## 2021-04-26 LAB — CBC WITH DIFFERENTIAL/PLATELET
Basophils Absolute: 0 10*3/uL (ref 0.0–0.2)
Basos: 0 %
EOS (ABSOLUTE): 0 10*3/uL (ref 0.0–0.4)
Eos: 0 %
Hematocrit: 35.4 % (ref 34.0–46.6)
Hemoglobin: 11.6 g/dL (ref 11.1–15.9)
Immature Grans (Abs): 0 10*3/uL (ref 0.0–0.1)
Immature Granulocytes: 0 %
Lymphocytes Absolute: 2.8 10*3/uL (ref 0.7–3.1)
Lymphs: 27 %
MCH: 27.2 pg (ref 26.6–33.0)
MCHC: 32.8 g/dL (ref 31.5–35.7)
MCV: 83 fL (ref 79–97)
Monocytes Absolute: 0.6 10*3/uL (ref 0.1–0.9)
Monocytes: 6 %
Neutrophils Absolute: 7 10*3/uL (ref 1.4–7.0)
Neutrophils: 67 %
Platelets: 248 10*3/uL (ref 150–450)
RBC: 4.26 x10E6/uL (ref 3.77–5.28)
RDW: 13.1 % (ref 11.7–15.4)
WBC: 10.5 10*3/uL (ref 3.4–10.8)

## 2021-04-26 LAB — LIPID PANEL
Chol/HDL Ratio: 2.2 ratio (ref 0.0–4.4)
Cholesterol, Total: 131 mg/dL (ref 100–199)
HDL: 59 mg/dL (ref >39)
LDL Chol Calc (NIH): 54 mg/dL (ref 0–99)
Triglycerides: 98 mg/dL (ref 0–149)
VLDL Cholesterol Cal: 18 mg/dL (ref 5–40)

## 2021-04-26 LAB — BAYER DCA HB A1C WAIVED: HB A1C (BAYER DCA - WAIVED): 6.1 % — ABNORMAL HIGH (ref 4.8–5.6)

## 2021-04-26 MED ORDER — OMEPRAZOLE 40 MG PO CPDR: 40.0000 mg | DELAYED_RELEASE_CAPSULE | Freq: Every day | ORAL | 3 refills | Status: DC

## 2021-04-26 NOTE — Progress Notes (Signed)
BP 122/64    Pulse 95    Temp (!) 96.8 F (36 C) (Oral)    Resp 20    Ht _0  (1.6 m)    Wt 145 lb (65.8 kg)    SpO2 95%    BMI 25.69 kg/m    Subjective:   Patient ID: Tonya Carpenter, female    DOB: 05/17/1945, 76 y.o.   MRN: [id number]  HPI: Tonya Carpenter is a 76 y.o. female presenting on 04/26/2021 for Medical Management of Chronic Issues   HPI Type 2 diabetes mellitus Patient comes in today for recheck of his diabetes. Patient has been currently taking Janumet. Patient is currently on an ACE inhibitor/ARB. Patient will has not seen an ophthalmologist this year. Patient denies any issues with their feet. The symptom started onset as an adult hypertension and hyperlipidemia ARE RELATED TO DM   Hypertension Patient is currently on losartan hydrochlorothiazide and metoprolol and hydralazine, and their blood pressure today is 122/64. Patient denies any lightheadedness or dizziness. Patient denies headaches, blurred vision, chest pains, shortness of breath, or weakness. Denies any side effects from medication and is content with current medication.   Hyperlipidemia Patient is coming in for recheck of his hyperlipidemia. The patient is currently taking atorvastatin. They deny any issues with myalgias or history of liver damage from it. They deny any focal numbness or weakness or chest pain.   Relevant past medical, surgical, family and social history reviewed and updated as indicated. Interim medical history since our last visit reviewed. Allergies and medications reviewed and updated.  Review of Systems  Constitutional:  Negative for chills and fever.  Eyes:  Negative for visual disturbance.  Respiratory:  Negative for chest tightness and shortness of breath.   Cardiovascular:  Negative for chest pain and leg swelling.  Musculoskeletal:  Negative for back pain and gait problem.  Skin:  Negative for rash.  Neurological:  Negative for dizziness, light-headedness and headaches.   Psychiatric/Behavioral:  Negative for agitation and behavioral problems.   All other systems reviewed and are negative.  Per HPI unless specifically indicated above   Allergies as of 04/26/2021       Reactions   Trilyte [peg 3350-kcl-na Bicarb-nacl]    Nausea/vomtiing        Medication List        Accurate as of April 26, 2021 10:24 AM. If you have any questions, ask your nurse or doctor.          Accu-Chek Guide test strip Generic drug: glucose blood TEST BLOOD SUGAR TWICE DAILY AND AS NEEDED FOR DIABETES   Accu-Chek Guide w/Device Kit USE AS DIRECTED FOR DIABETES   acetaminophen 500 MG tablet Commonly known as: TYLENOL Take 1,000 mg by mouth every 6 (six) hours as needed for mild pain or headache.   aspirin 81 MG EC tablet Commonly known as: Aspirin Low Dose Take 1 tablet (81 mg total) by mouth daily. Swallow whole.   atorvastatin 20 MG tablet Commonly known as: LIPITOR Take 1 tablet (20 mg total) by mouth daily.   cholecalciferol 25 MCG (1000 UNIT) tablet Commonly known as: VITAMIN D Take 1 tablet (1,000 Units total) by mouth daily.   diclofenac Sodium 1 % Gel Commonly known as: VOLTAREN Apply 2 g topically 4 (four) times daily as needed.   DropSafe Alcohol Prep 70 % Pads USE  TO CHECK BLOOD SUGAR TWICE DAILY  AND AS NEEDED   hydrALAZINE 10 MG tablet Commonly known as: APRESOLINE  TAKE 1 TABLET TWICE DAILY   Janumet XR 50-500 MG Tb24 Generic drug: SitaGLIPtin-MetFORMIN HCl Take 1 tablet by mouth 2 (two) times daily.   losartan-hydrochlorothiazide 50-12.5 MG tablet Commonly known as: HYZAAR Take 1 tablet by mouth daily.   metoprolol succinate 50 MG 24 hr tablet Commonly known as: TOPROL-XL Take 1 tablet (50 mg total) by mouth daily. Take with or immediately following a meal.   nitroGLYCERIN 0.4 MG SL tablet Commonly known as: NITROSTAT DISSOLVE 1 TABLET UNDER THE TONGUE EVERY 5 MINUTES AS NEEDED FOR CHEST PAIN   omeprazole 40 MG  capsule Commonly known as: PRILOSEC Take 1 capsule (40 mg total) by mouth daily.   polyethylene glycol 17 g packet Commonly known as: MIRALAX / GLYCOLAX Take 17 g by mouth daily as needed.   REFRESH RELIEVA OP Apply to eye.   True Metrix Level 1 Low Soln Use as needed with BS machine   TRUEdraw Lancing Device Misc Teset BS BID Dx E11.9   TRUEplus Lancets 30G Misc Teset BS BID Dx E11.9   Accu-Chek Softclix Lancets lancets TEST BLOOD SUGAR TWICE DAILY AND AS NEEDED FOR DIABETES   Prodigy Twist Top Lancets 28G Misc TEST BLOOD SUGAR TWICE DAILY Dx E11.9         Objective:   BP 122/64    Pulse 95    Temp (!) 96.8 F (36 C) (Oral)    Resp 20    Ht _0  (1.6 m)    Wt 145 lb (65.8 kg)    SpO2 95%    BMI 25.69 kg/m   Wt Readings from Last 3 Encounters:  04/26/21 145 lb (65.8 kg)  01/25/21 146 lb (66.2 kg)  10/26/20 150 lb (68 kg)    Physical Exam Vitals and nursing note reviewed.  Constitutional:      General: She is not in acute distress.    Appearance: She is well-developed. She is not diaphoretic.  Eyes:     Conjunctiva/sclera: Conjunctivae normal.  Cardiovascular:     Rate and Rhythm: Normal rate and regular rhythm.     Heart sounds: Normal heart sounds. No murmur heard. Pulmonary:     Effort: Pulmonary effort is normal. No respiratory distress.     Breath sounds: Normal breath sounds. No wheezing.  Musculoskeletal:        General: No tenderness. Normal range of motion.  Skin:    General: Skin is warm and dry.     Findings: No rash.  Neurological:     Mental Status: She is alert and oriented to person, place, and time.     Coordination: Coordination normal.  Psychiatric:        Behavior: Behavior normal.      Assessment & Plan:   Problem List Items Addressed This Visit       Cardiovascular and Mediastinum   Hypertension associated with diabetes ([place])   Relevant Orders   Microalbumin / creatinine urine ratio   Bayer DCA Hb A1c Waived   CBC with  Differential/Platelet   CMP14+EGFR   Lipid panel     Digestive   GERD (gastroesophageal reflux disease)   Relevant Medications   omeprazole (PRILOSEC) 40 MG capsule     Endocrine   Type 2 diabetes mellitus ([place]) - Primary   Relevant Orders   Microalbumin / creatinine urine ratio   Bayer DCA Hb A1c Waived   CBC with Differential/Platelet   CMP14+EGFR   Lipid panel   Hyperlipidemia associated with type 2 diabetes mellitus (  [place])   Relevant Orders   Microalbumin / creatinine urine ratio   Bayer DCA Hb A1c Waived   CBC with Differential/Platelet   CMP14+EGFR   Lipid panel   CKD stage 3 due to type 2 diabetes mellitus ([place])   Other Visit Diagnoses     Illiteracy and low-level literacy            Patient gets the occasional diarrhea.  She does state that she has been using MiraLAX every day, heavily recommended that she only use it when she gets constipation.  Also instructed her that she can use Imodium but only use it when she has diarrhea.  She understood this but there is some challenges because of education level.  A1c is 6.1, looks good, no change medicine Follow up plan: Return in about 3 months (around 07/24/2021), or if symptoms worsen or fail to improve, for Diabetes and hypertension and cholesterol.  Counseling provided for all of the vaccine components Orders Placed This Encounter  Procedures   Microalbumin / creatinine urine ratio   Bayer DCA Hb A1c Waived   CBC with Differential/Platelet   CMP14+EGFR   Lipid panel    Caryl Pina, MD [facility] 04/26/2021, 10:24 AM

## 2021-04-27 ENCOUNTER — Telehealth: Payer: Self-pay | Admitting: Family Medicine

## 2021-04-27 LAB — MICROALBUMIN / CREATININE URINE RATIO
Creatinine, Urine: 155.5 mg/dL
Microalb/Creat Ratio: 78 mg/g creat — ABNORMAL HIGH (ref 0–29)
Microalbumin, Urine: 121.6 ug/mL

## 2021-05-07 ENCOUNTER — Telehealth: Payer: Self-pay | Admitting: Family Medicine

## 2021-05-09 NOTE — Telephone Encounter (Signed)
Pt advised of provider feedback and voiced understanding. 

## 2021-05-09 NOTE — Telephone Encounter (Signed)
I believe at the last visit we instructed her to contact social worker through [employer] and caps through the [facility] department and see if they could offer help, that is probably the best option.  I cannot recall what other options we tossed out but I think those were the first 2, make sure she has called them

## 2021-05-14 ENCOUNTER — Telehealth: Payer: Self-pay | Admitting: Family Medicine

## 2021-05-14 NOTE — Telephone Encounter (Signed)
Pt states she was able to find someone to help her at home but she needs a letter/form filled out by Dr Museum/gallery exhibitions officer. Pt states the person is a CNA and will bring the form by later to be completed.

## 2021-05-15 ENCOUNTER — Encounter: Payer: Self-pay | Admitting: Nurse Practitioner

## 2021-05-15 ENCOUNTER — Ambulatory Visit (INDEPENDENT_AMBULATORY_CARE_PROVIDER_SITE_OTHER): Payer: Medicare HMO | Admitting: Nurse Practitioner

## 2021-05-15 VITALS — BP 133/61 | HR 54 | Temp 98.2°F | Ht 63.0 in | Wt 145.0 lb

## 2021-05-15 DIAGNOSIS — M25561 Pain in right knee: Secondary | ICD-10-CM | POA: Diagnosis not present

## 2021-05-15 MED ORDER — METHYLPREDNISOLONE ACETATE 40 MG/ML IJ SUSP
40.0000 mg | Freq: Once | INTRAMUSCULAR | Status: AC
  Administered 2021-05-15: 40 mg via INTRAMUSCULAR

## 2021-05-15 NOTE — Patient Instructions (Signed)
Acute Knee Pain, Adult °Many things can cause knee pain. Sometimes, knee pain is sudden (acute) and may be caused by damage, swelling, or irritation of the muscles and tissues that support your knee. °The pain often goes away on its own with time and rest. If the pain does not go away, tests may be done to find out what is causing the pain. °Follow these instructions at home: °If you have a knee sleeve or brace: ° °Wear the knee sleeve or brace as told by your doctor. Take it off only as told by your doctor. °Loosen it if your toes: °Tingle. °Become numb. °Turn cold and blue. °Keep it clean. °If the knee sleeve or brace is not waterproof: °Do not let it get wet. °Cover it with a watertight covering when you take a bath or shower. °Activity °Rest your knee. °Do not do things that cause pain or make pain worse. °Avoid activities where both feet leave the ground at the same time (high-impact activities). Examples are running, jumping rope, and doing jumping jacks. °Work with a physical therapist to make a safe exercise program, as told by your doctor. °Managing pain, stiffness, and swelling ° °If told, put ice on the knee. To do this: °If you have a removable knee sleeve or brace, take it off as told by your doctor. °Put ice in a plastic bag. °Place a towel between your skin and the bag. °Leave the ice on for 20 minutes, 2-3 times a day. °Take off the ice if your skin turns bright red. This is very important. If you cannot feel pain, heat, or cold, you have a greater risk of damage to the area. °If told, use an elastic bandage to put pressure (compression) on your injured knee. °Raise your knee above the level of your heart while you are sitting or lying down. °Sleep with a pillow under your knee. °General instructions °Take over-the-counter and prescription medicines only as told by your doctor. °Do not smoke or use any products that contain nicotine or tobacco. If you need help quitting, ask your doctor. °If you are  overweight, work with your doctor and a food expert (dietitian) to set goals to lose weight. Being overweight can make your knee hurt more. °Watch for any changes in your symptoms. °Keep all follow-up visits. °Contact a doctor if: °The knee pain does not stop. °The knee pain changes or gets worse. °You have a fever along with knee pain. °Your knee is red or feels warm when you touch it. °Your knee gives out or locks up. °Get help right away if: °Your knee swells, and the swelling gets worse. °You cannot move your knee. °You have very bad knee pain that does not get better with pain medicine. °Summary °Many things can cause knee pain. The pain often goes away on its own with time and rest. °Your doctor may do tests to find out the cause of the pain. °Watch for any changes in your symptoms. Relieve your pain with rest, medicines, light activity, and use of ice. °Get help right away if you cannot move your knee or your knee pain is very bad. °This information is not intended to replace advice given to you by your health care provider. Make sure you discuss any questions you have with your health care provider. °Document Revised: 08/18/2019 Document Reviewed: 08/18/2019 °Elsevier Patient Education © 2022 Elsevier Inc. ° °

## 2021-05-15 NOTE — Addendum Note (Signed)
Addended by: Angela Nevin D on: 05/15/2021 10:27 AM   Modules accepted: Orders

## 2021-05-15 NOTE — Progress Notes (Signed)
Acute Office Visit  Subjective:    Patient ID: Tonya Carpenter, female    DOB: 1945-09-14, 76 y.o.   MRN: [id number]  Chief Complaint  Patient presents with   right knee pain    HPI Patient is in today for Pain  She reports recurrent Right knee pain. was not an injury that may have caused the pain. The pain started a few days ago and is staying constant. The pain does not radiate. The pain is described as aching, is 6/10 in intensity, occurring constantly. Symptoms are worse in the: mid-day, afternoon, evening  Aggravating factors: walking Relieving factors: none.  She has tried application of ice and NSAIDs with no relief.     Past Medical History:  Diagnosis Date   Anxiety    Arthritis    "hands" (01/14/2013)   Asthma    Cataract    Coronary artery disease    Non obstructive CAD 2014 cath.    Exertional shortness of breath    Hyperlipidemia    Hypertension    Stroke ([place])    "light stroke long years ago; didn't affect me permanently" (01/14/2013)   Type 2 diabetes mellitus [facility])     Past Surgical History:  Procedure Laterality Date   APPENDECTOMY     CARDIAC CATHETERIZATION  2008   no angiographically significant CAD   CARDIOVASCULAR STRESS TEST  12/2009   no evidence for stress-induced reversibility or ischemia.  She had a fixed anterior septal wall defect, possibly related to breast attenuation and her EF was 54%.   CATARACT EXTRACTION W/PHACO Right 10/25/2014   Procedure: CATARACT EXTRACTION PHACO AND INTRAOCULAR LENS PLACEMENT; CDE:  17.28;  Surgeon: Rutherford Guys, MD;  Location: [facility];  Service: Ophthalmology;  Laterality: Right;   CATARACT EXTRACTION W/PHACO Left 11/08/2014   Procedure: CATARACT EXTRACTION PHACO AND INTRAOCULAR LENS PLACEMENT (IOC);  Surgeon: Rutherford Guys, MD;  Location: [facility];  Service: Ophthalmology;  Laterality: Left;  CDE: 5.94   CHOLECYSTECTOMY     COLONOSCOPY N/A 03/31/2019   Procedure: COLONOSCOPY;  Surgeon: Danie Binder, MD;  Location:  [facility] END[facility] SU[facility];  Service: Endoscopy;  Laterality: N/A;  10:30   DILATION AND CURETTAGE OF UTERUS     LEFT HEART CATHETERIZATION WITH CORONARY ANGIOGRAM N/A 01/14/2013   Procedure: LEFT HEART CATHETERIZATION WITH CORONARY ANGIOGRAM;  Surgeon: Burnell Blanks, MD;  Location: [facility] CATH LAB;  Service: Cardiovascular;  Laterality: N/A;   TONSILLECTOMY     TUBAL LIGATION     VAGINAL HYSTERECTOMY      Family History  Problem Relation Age of Onset   Hypertension Mother    Hypertension Father    Cancer Sister        breast   Heart Problems Son    Seizures Son     Social History   Socioeconomic History   Marital status: Widowed    Spouse name: Not on file   Number of children: 2   Years of education: Not on file   Highest education level: Not on file  Occupational History   Occupation: Disability    Employer: UNEMPLOYED  Tobacco Use   Smoking status: Never   Smokeless tobacco: Never  Substance and Sexual Activity   Alcohol use: No   Drug use: No   Sexual activity: Not on file  Other Topics Concern   Not on file  Social History Narrative   Lives in [place], [place] alone - her 2 sons live in [place] Determinants of  Health   Financial Resource Strain: Not on file  Food Insecurity: Not on file  Transportation Needs: Not on file  Physical Activity: Inactive   Days of Exercise per Week: 0 days   Minutes of Exercise per Session: 0 min  Stress: Stress Concern Present   Feeling of Stress : To some extent  Social Connections: Moderately Integrated   Frequency of Communication with Friends and Family: More than three times a week   Frequency of Social Gatherings with Friends and Family: Once a week   Attends Religious Services: More than 4 times per year   Active Member of [employer] or Organizations: Yes   Attends Archivist Meetings: More than 4 times per year   Marital Status: Widowed  Human resources officer Violence: Not At Risk   Fear of Current or Ex-Partner: No    Emotionally Abused: No   Physically Abused: No   Sexually Abused: No    Outpatient Medications Prior to Visit  Medication Sig Dispense Refill   ACCU-CHEK GUIDE test strip TEST BLOOD SUGAR TWICE DAILY AND AS NEEDED FOR DIABETES 300 strip 3   Accu-Chek Softclix Lancets lancets TEST BLOOD SUGAR TWICE DAILY AND AS NEEDED FOR DIABETES 300 each 3   acetaminophen (TYLENOL) 500 MG tablet Take 1,000 mg by mouth every 6 (six) hours as needed for mild pain or headache.      Alcohol Swabs (DROPSAFE ALCOHOL PREP) 70 % PADS USE  TO CHECK BLOOD SUGAR TWICE DAILY  AND AS NEEDED 200 each 3   aspirin (ASPIRIN LOW DOSE) 81 MG EC tablet Take 1 tablet (81 mg total) by mouth daily. Swallow whole. 90 tablet 1   atorvastatin (LIPITOR) 20 MG tablet Take 1 tablet (20 mg total) by mouth daily. 90 tablet 3   Blood Glucose Calibration (TRUE METRIX LEVEL 1) Low SOLN Use as needed with BS machine 3 each 1   Blood Glucose Monitoring Suppl (ACCU-CHEK GUIDE) w/Device KIT USE AS DIRECTED FOR DIABETES 1 kit 0   Carboxymethylcellul-Glycerin (REFRESH RELIEVA OP) Apply to eye.     cholecalciferol (VITAMIN D) 25 MCG (1000 UNIT) tablet Take 1 tablet (1,000 Units total) by mouth daily. 90 tablet 3   diclofenac Sodium (VOLTAREN) 1 % GEL Apply 2 g topically 4 (four) times daily as needed. 350 g 5   hydrALAZINE (APRESOLINE) 10 MG tablet TAKE 1 TABLET TWICE DAILY 180 tablet 0   JANUMET XR 50-500 MG TB24 Take 1 tablet by mouth 2 (two) times daily. 180 tablet 3   Lancet Devices (TRUEDRAW LANCING DEVICE) MISC Teset BS BID Dx E11.9 1 each 2   losartan-hydrochlorothiazide (HYZAAR) 50-12.5 MG tablet Take 1 tablet by mouth daily. 90 tablet 3   metoprolol succinate (TOPROL-XL) 50 MG 24 hr tablet Take 1 tablet (50 mg total) by mouth daily. Take with or immediately following a meal. 90 tablet 3   nitroGLYCERIN (NITROSTAT) 0.4 MG SL tablet DISSOLVE 1 TABLET UNDER THE TONGUE EVERY 5 MINUTES AS NEEDED FOR CHEST PAIN 25 tablet 2   omeprazole  (PRILOSEC) 40 MG capsule Take 1 capsule (40 mg total) by mouth daily. 90 capsule 3   polyethylene glycol (MIRALAX / GLYCOLAX) 17 g packet Take 17 g by mouth daily as needed. 100 each 1   Prodigy Twist Top Lancets 28G MISC TEST BLOOD SUGAR TWICE DAILY Dx E11.9 200 each 3   TRUEplus Lancets 30G MISC Teset BS BID Dx E11.9 200 each 3   No facility-administered medications prior to visit.  Allergies  Allergen Reactions   Trilyte [Peg 3350-Kcl-Na Bicarb-Nacl]     Nausea/vomtiing    Review of Systems  Constitutional: Negative.   HENT: Negative.    Eyes: Negative.   Respiratory: Negative.    Cardiovascular: Negative.   Gastrointestinal: Negative.   Musculoskeletal: Negative.   Skin: Negative.  Negative for rash.  Neurological: Negative.   Psychiatric/Behavioral: Negative.    All other systems reviewed and are negative.     Objective:    Physical Exam Vitals and nursing note reviewed.  Constitutional:      Appearance: Normal appearance.  HENT:     Head: Normocephalic.     Right Ear: External ear normal.     Left Ear: External ear normal.     Nose: Nose normal.     Mouth/Throat:     Pharynx: Oropharynx is clear.  Eyes:     Conjunctiva/sclera: Conjunctivae normal.  Cardiovascular:     Rate and Rhythm: Normal rate. Rhythm irregular.     Pulses: Normal pulses.     Heart sounds: Normal heart sounds.  Pulmonary:     Effort: Pulmonary effort is normal.     Breath sounds: Normal breath sounds.  Musculoskeletal:     Right knee: Swelling present. Decreased range of motion. Tenderness present.  Neurological:     Mental Status: She is alert.  Psychiatric:        Behavior: Behavior normal.    BP 133/61    Pulse (!) 54    Temp 98.2 F (36.8 C)    Ht _0  (1.6 m)    Wt 145 lb (65.8 kg)    SpO2 98%    BMI 25.69 kg/m  Wt Readings from Last 3 Encounters:  05/15/21 145 lb (65.8 kg)  04/26/21 145 lb (65.8 kg)  01/25/21 146 lb (66.2 kg)    Health Maintenance Due  Topic Date  Due   OPHTHALMOLOGY EXAM  03/25/2018   MAMMOGRAM  05/10/2021    There are no preventive care reminders to display for this patient.   Lab Results  Component Value Date   TSH 0.977 12/03/2018   Lab Results  Component Value Date   WBC 10.5 04/26/2021   HGB 11.6 04/26/2021   HCT 35.4 04/26/2021   MCV 83 04/26/2021   PLT 248 04/26/2021   Lab Results  Component Value Date   NA 142 04/26/2021   K 3.4 (L) 04/26/2021   CO2 27 04/26/2021   GLUCOSE 120 (H) 04/26/2021   BUN 13 04/26/2021   CREATININE 1.29 (H) 04/26/2021   BILITOT 0.5 04/26/2021   ALKPHOS 56 04/26/2021   AST 13 04/26/2021   ALT 10 04/26/2021   PROT 7.6 04/26/2021   ALBUMIN 4.3 04/26/2021   CALCIUM 10.0 04/26/2021   ANIONGAP 10 02/12/2016   EGFR 44 (L) 04/26/2021   Lab Results  Component Value Date   CHOL 131 04/26/2021   Lab Results  Component Value Date   HDL 59 04/26/2021   Lab Results  Component Value Date   LDLCALC 54 04/26/2021   Lab Results  Component Value Date   TRIG 98 04/26/2021   Lab Results  Component Value Date   CHOLHDL 2.2 04/26/2021   Lab Results  Component Value Date   HGBA1C 6.1 (H) 04/26/2021       Assessment & Plan:  Right knee pain is well controlled.  Patient currently using Voltaren gel.  Advised patient to elevate knee, knee brace recommended, ice compress as tolerated, continue Voltaren gel,  40 Depo-Medrol shot given in clinic. Patient may need imaging/physical therapy/orthopedic referral if symptoms are not resolved. Education provided to patient printed handouts given.  Follow-up for worsening hours of symptoms. Problem List Items Addressed This Visit   None Visit Diagnoses     Acute pain of right knee    -  Primary        No orders of the defined types were placed in this encounter.    Ivy Lynn, NP

## 2021-05-16 ENCOUNTER — Other Ambulatory Visit: Payer: Self-pay | Admitting: Family Medicine

## 2021-05-16 ENCOUNTER — Telehealth: Payer: Medicare HMO

## 2021-05-16 ENCOUNTER — Telehealth: Payer: Self-pay | Admitting: Family Medicine

## 2021-05-16 DIAGNOSIS — I152 Hypertension secondary to endocrine disorders: Secondary | ICD-10-CM

## 2021-05-16 DIAGNOSIS — E1159 Type 2 diabetes mellitus with other circulatory complications: Secondary | ICD-10-CM

## 2021-05-16 NOTE — Telephone Encounter (Signed)
Pt wants to speak with nurse about forms ?

## 2021-05-16 NOTE — Telephone Encounter (Signed)
Pt brought by a personal care form (PSC) form, let her know that the form she brought in was for patient's with [employer] insurance, which she does not have and we are unable to fill the form out. ? ?She also talked about her knee, it is feeling a little better after getting the shot but the brace is not staying on. I told her to bring it by and let the triage nurse look at it since we gave it to her, not sure but we maybe able to give her a smaller one. ?

## 2021-05-18 IMAGING — DX DG WRIST COMPLETE 3+V*R*
3 series · 3 of 3 positions shown · non-contrast
Comparison: None.

CLINICAL DATA: Fall

EXAM:
RIGHT WRIST - COMPLETE 3+ VIEW

[wrist ap]
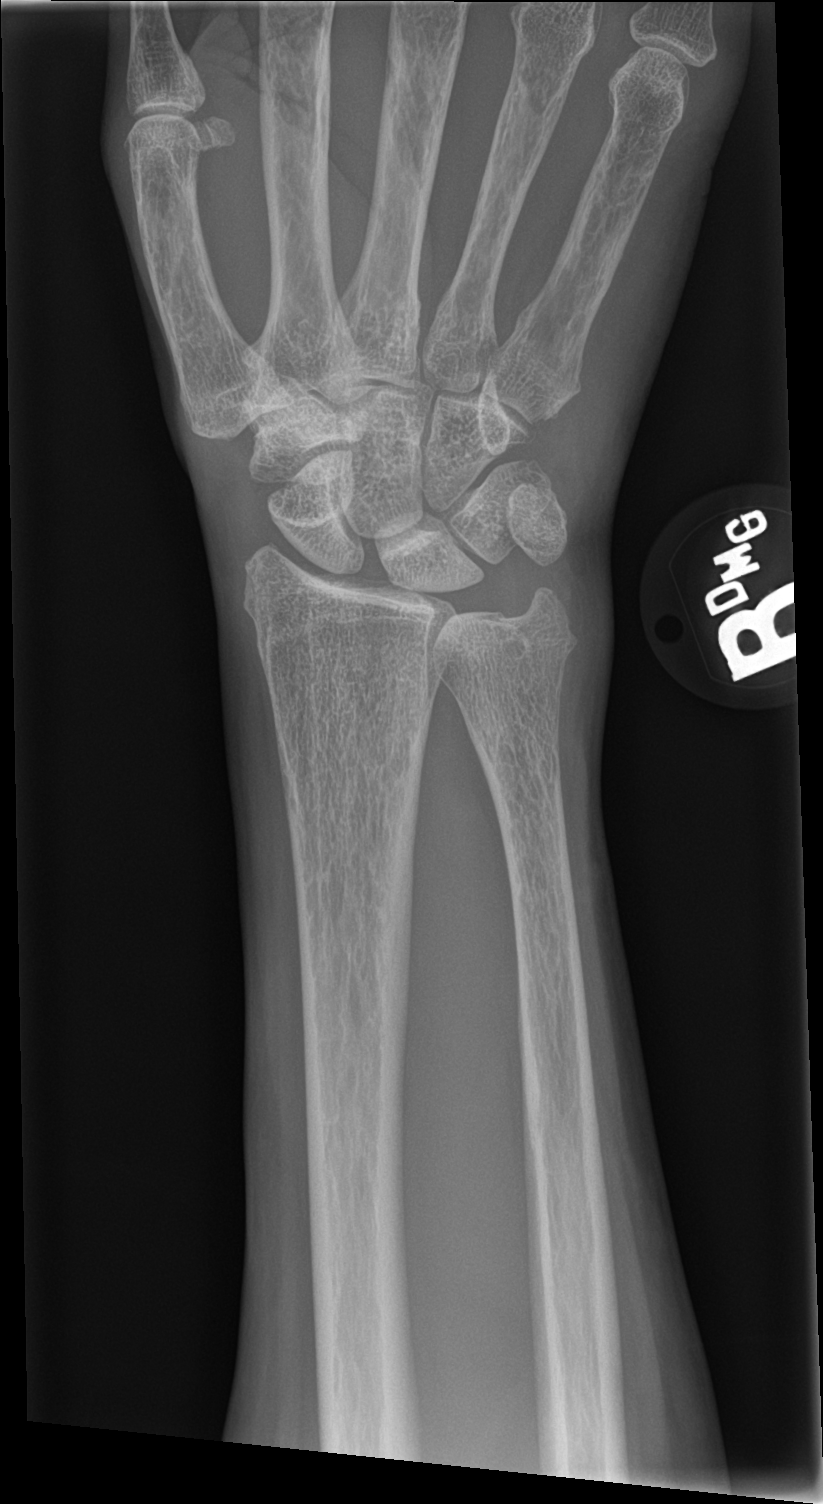

[wrist obl]
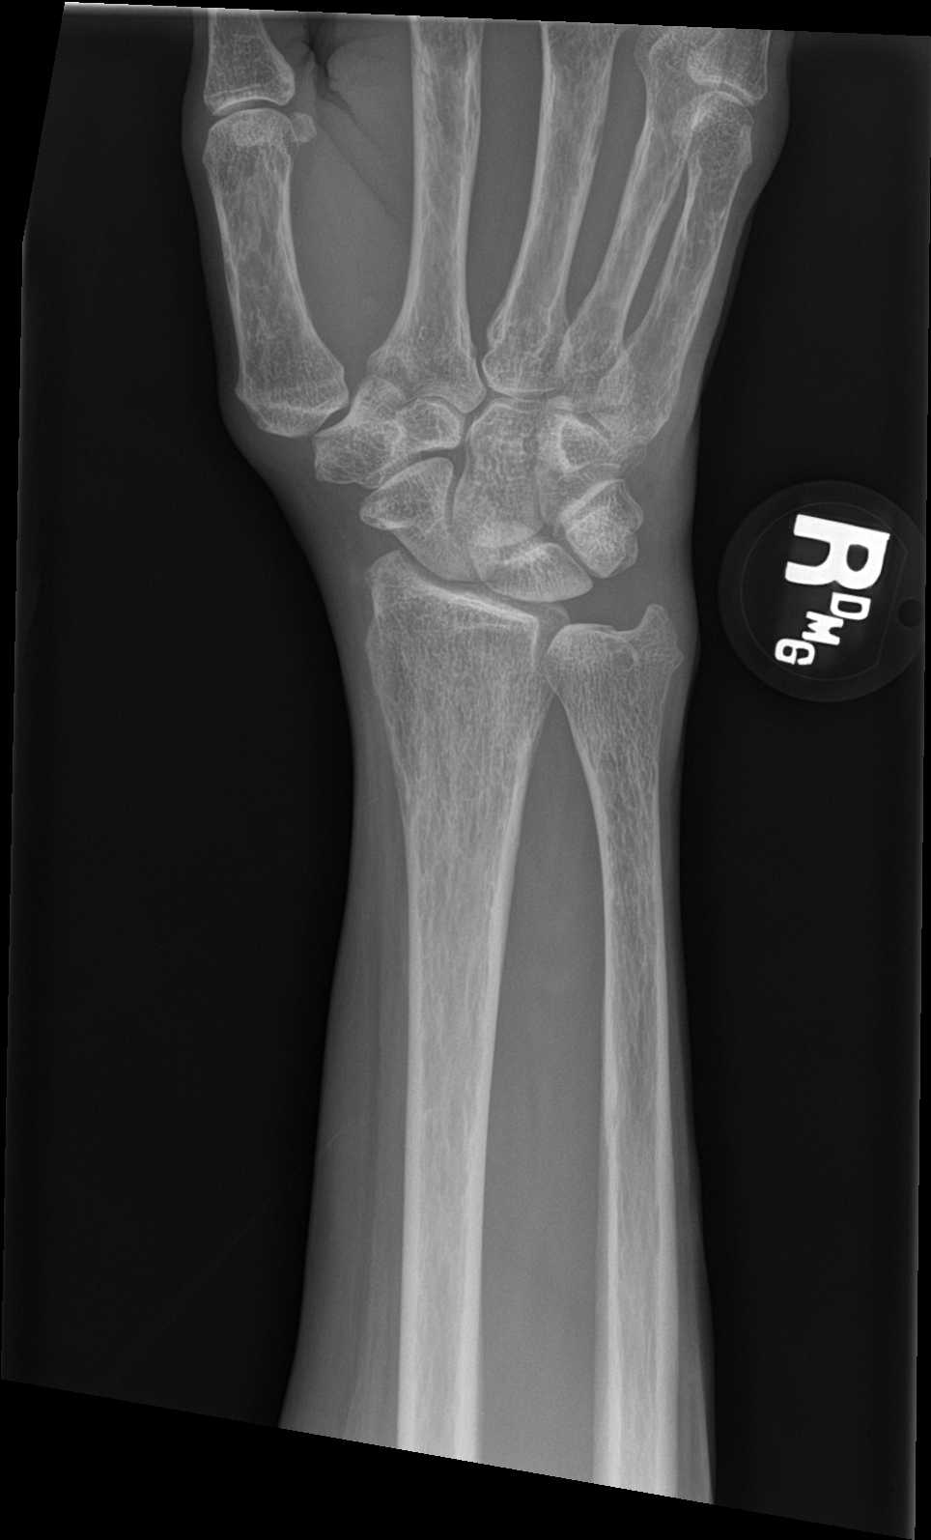

[wrist lat]
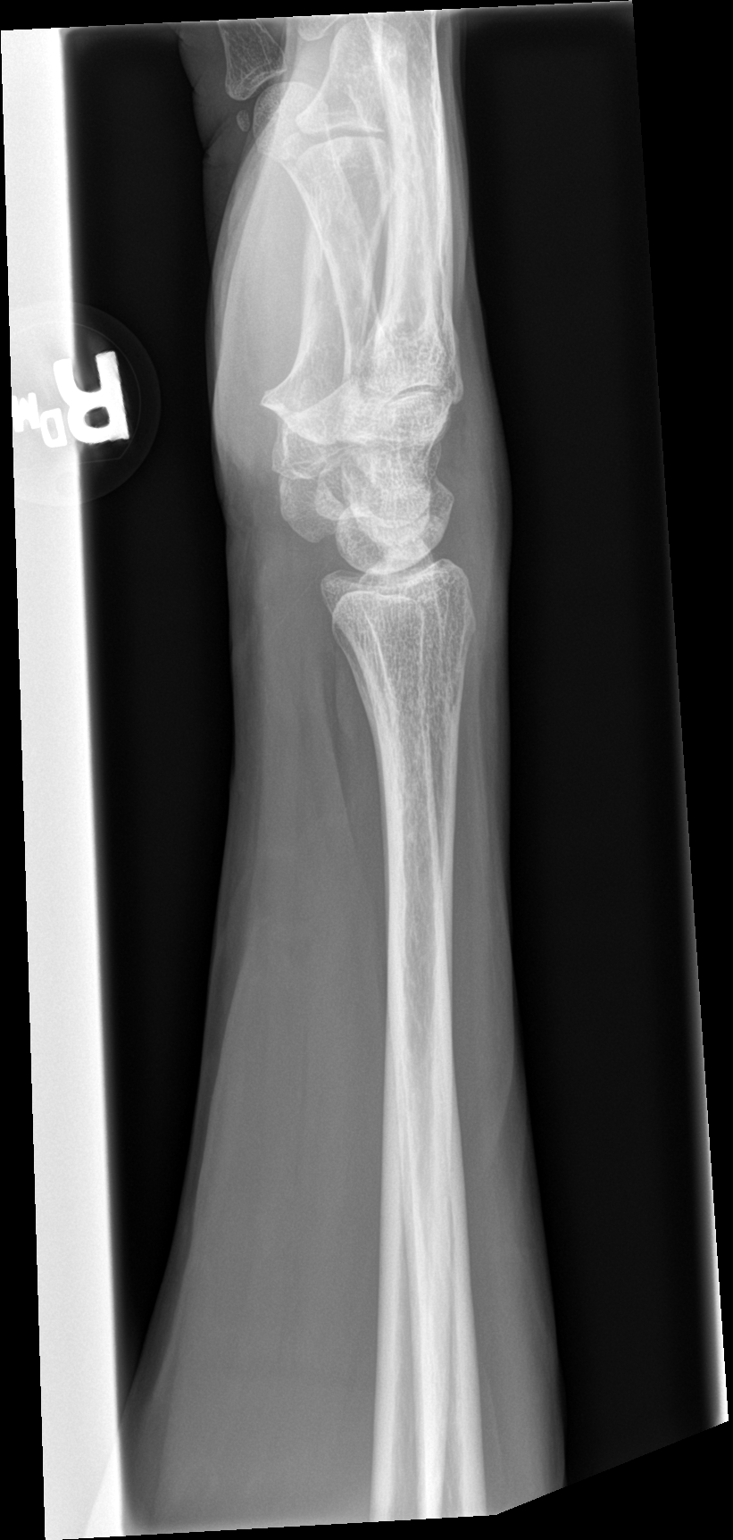

[3 of 3 positions shown; findings below may reference images not displayed]

FINDINGS: There is no evidence of fracture or dislocation. There is no
evidence of arthropathy or other focal bone abnormality. Soft
tissues are unremarkable.
IMPRESSION: Negative.

## 2021-05-18 IMAGING — DX DG HAND COMPLETE 3+V*R*
3 series · 3 of 3 positions shown · non-contrast
Comparison: None.

CLINICAL DATA: Fall

EXAM:
RIGHT HAND - COMPLETE 3+ VIEW

[hand ap]
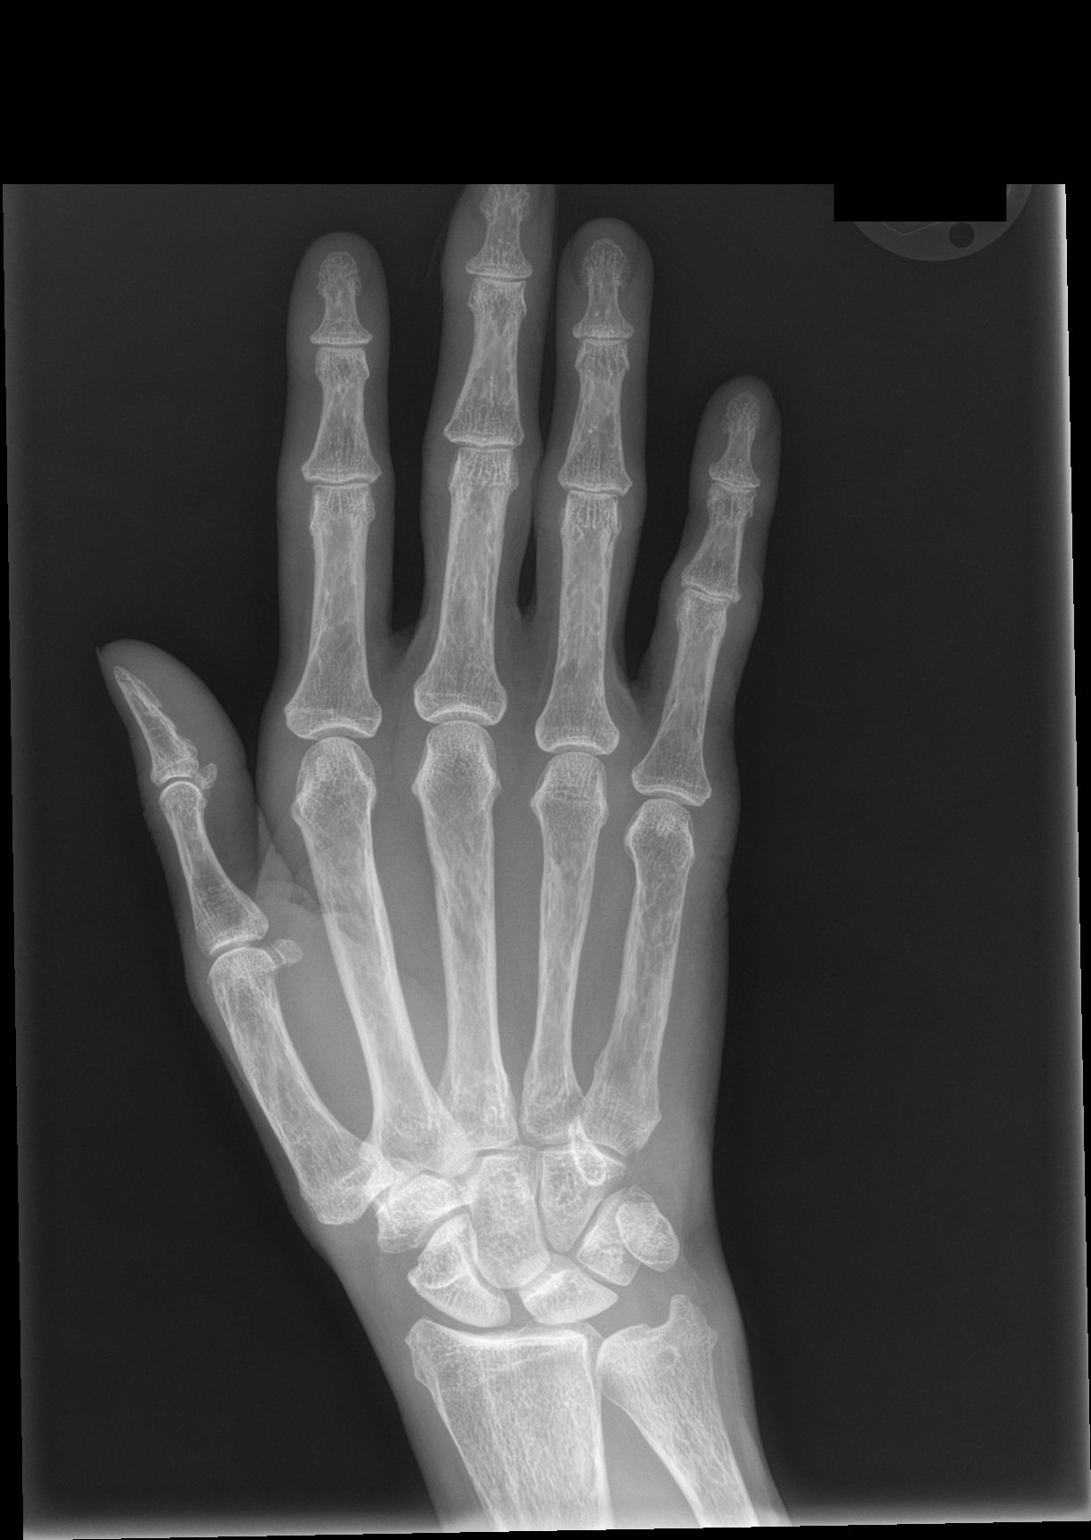

[hand obl]
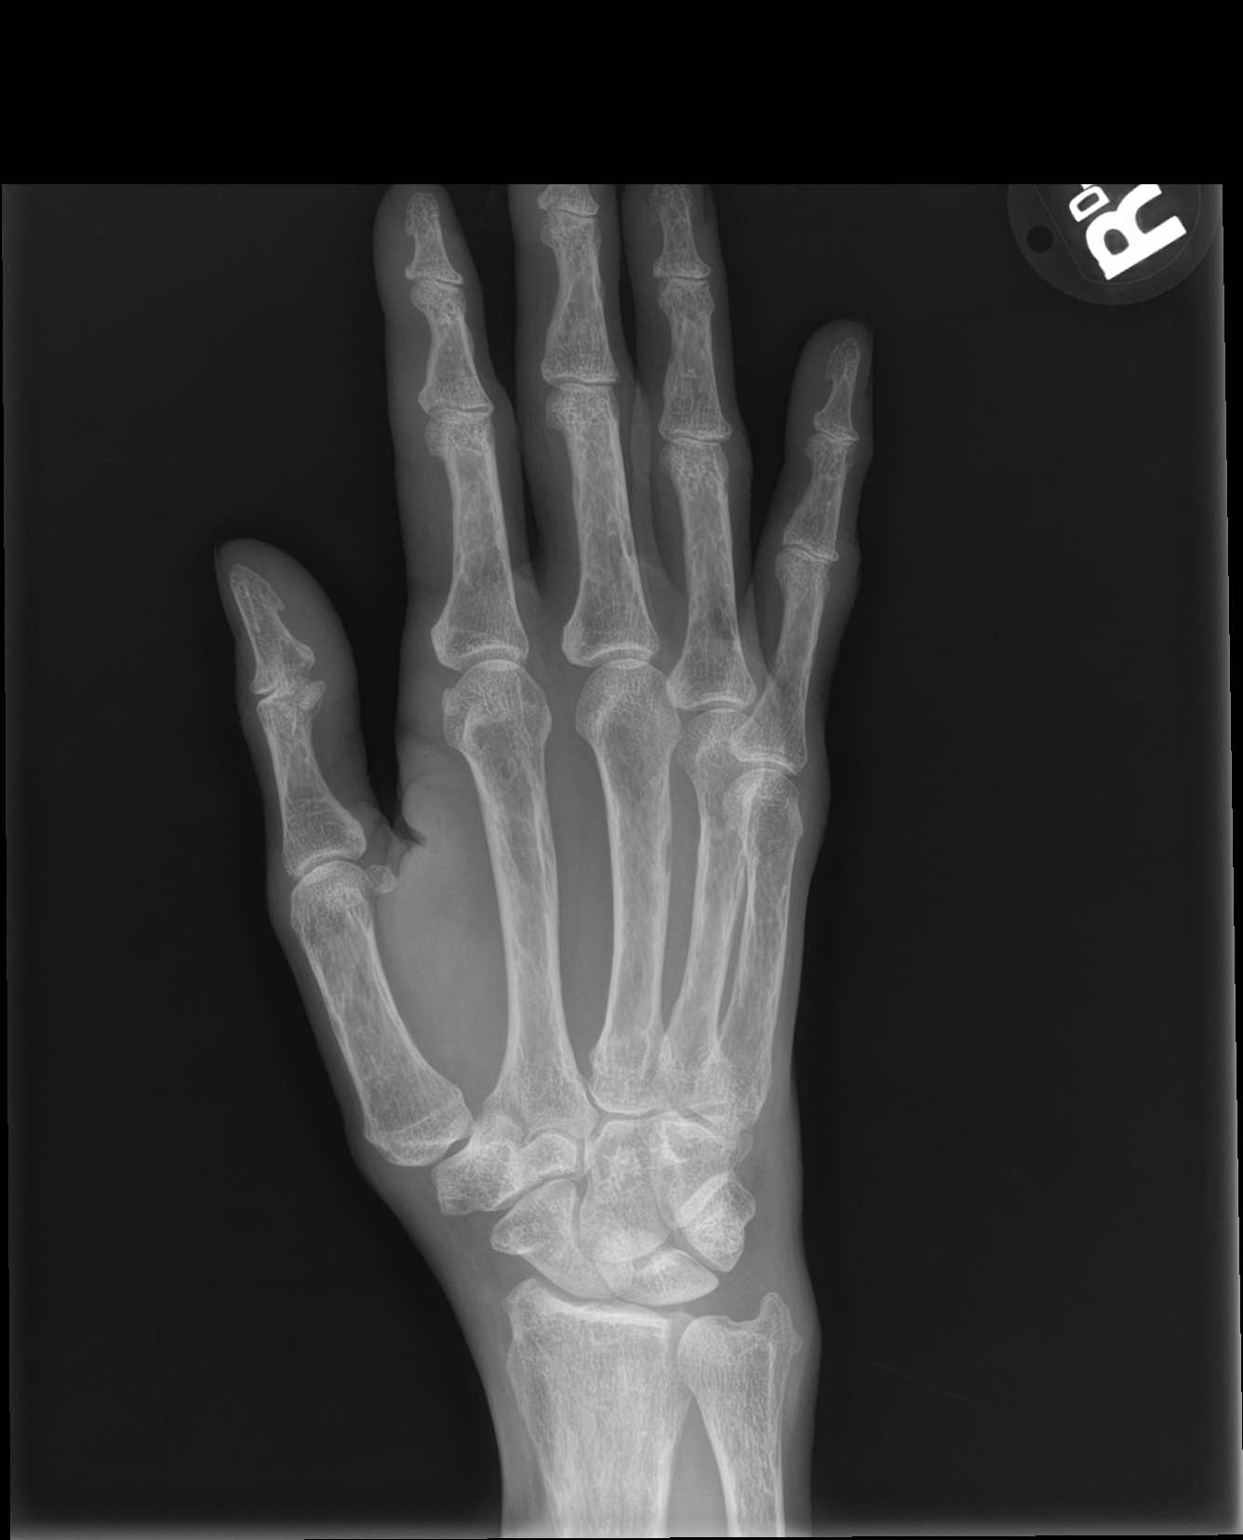

[hand lat]
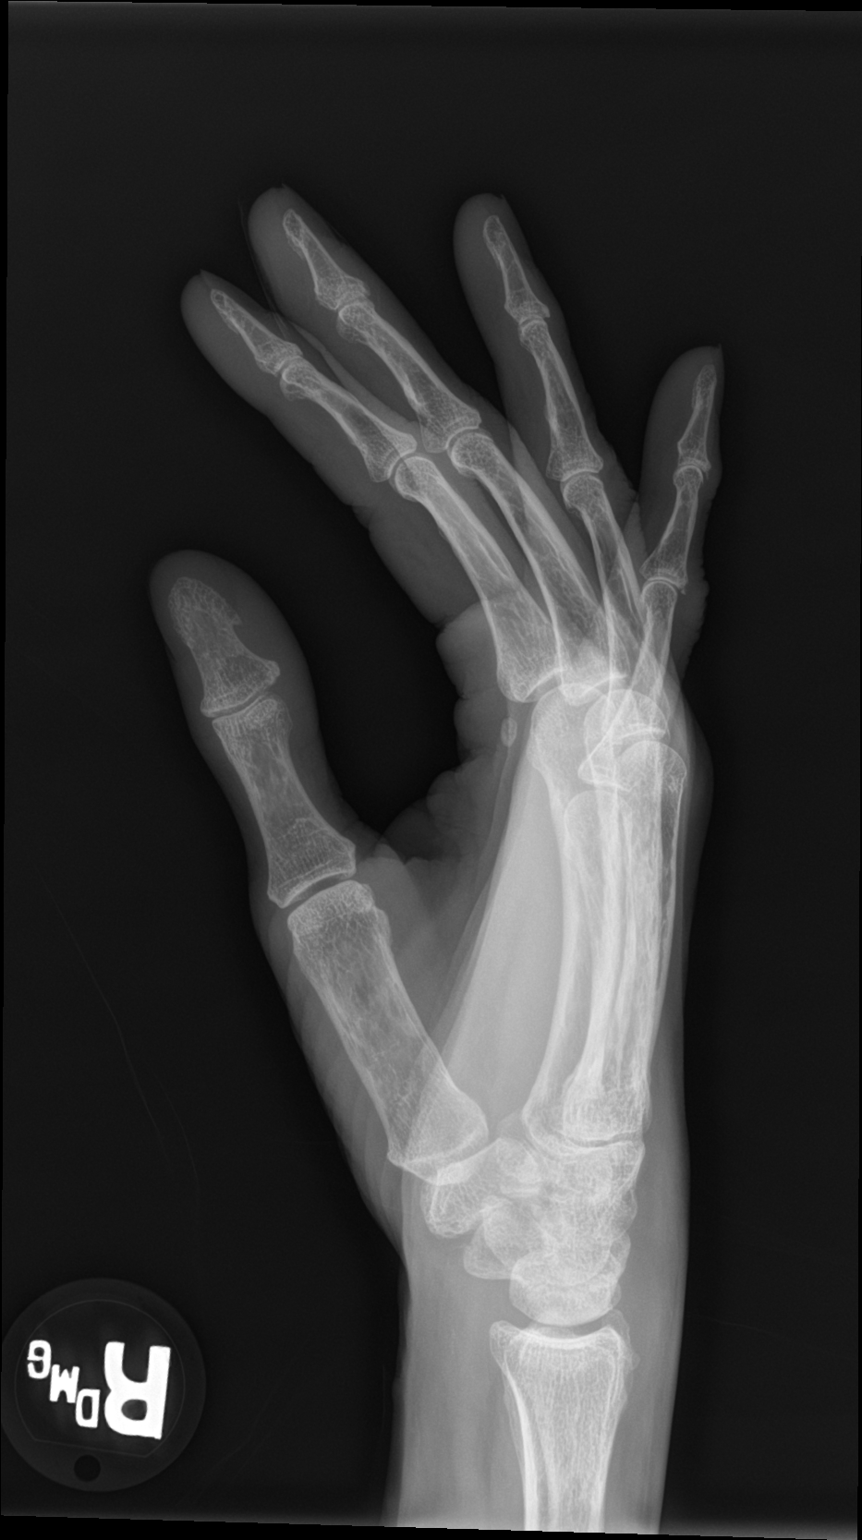

[3 of 3 positions shown; findings below may reference images not displayed]

FINDINGS: The bones are osteopenic. There is no acute fracture or dislocation
of the right hand. Joint spaces are preserved.
IMPRESSION: No acute fracture or dislocation of the right hand. Osteopenia.

## 2021-05-23 ENCOUNTER — Ambulatory Visit
Admission: RE | Admit: 2021-05-23 | Discharge: 2021-05-23 | Disposition: A | Discharge status: Home / Self Care | Payer: Medicare HMO | Source: Ambulatory Visit | Attending: Family Medicine | Admitting: Family Medicine

## 2021-05-23 ENCOUNTER — Other Ambulatory Visit: Payer: Self-pay

## 2021-05-23 DIAGNOSIS — Z1231 Encounter for screening mammogram for malignant neoplasm of breast: Secondary | ICD-10-CM | POA: Diagnosis not present

## 2021-05-28 ENCOUNTER — Other Ambulatory Visit: Payer: Self-pay | Admitting: Family Medicine

## 2021-05-28 DIAGNOSIS — N1832 Chronic kidney disease, stage 3b: Secondary | ICD-10-CM

## 2021-05-28 DIAGNOSIS — E1122 Type 2 diabetes mellitus with diabetic chronic kidney disease: Secondary | ICD-10-CM

## 2021-06-20 ENCOUNTER — Ambulatory Visit (INDEPENDENT_AMBULATORY_CARE_PROVIDER_SITE_OTHER): Payer: Medicare HMO | Admitting: Licensed Clinical Social Worker

## 2021-06-20 DIAGNOSIS — E1159 Type 2 diabetes mellitus with other circulatory complications: Secondary | ICD-10-CM

## 2021-06-20 DIAGNOSIS — I2510 Atherosclerotic heart disease of native coronary artery without angina pectoris: Secondary | ICD-10-CM

## 2021-06-20 DIAGNOSIS — M7541 Impingement syndrome of right shoulder: Secondary | ICD-10-CM

## 2021-06-20 DIAGNOSIS — K2100 Gastro-esophageal reflux disease with esophagitis, without bleeding: Secondary | ICD-10-CM

## 2021-06-20 DIAGNOSIS — R42 Dizziness and giddiness: Secondary | ICD-10-CM

## 2021-06-20 DIAGNOSIS — E1169 Type 2 diabetes mellitus with other specified complication: Secondary | ICD-10-CM

## 2021-06-20 NOTE — Patient Instructions (Addendum)
Visit Information ? ?Patient goals: Protect My Health (Patient).  Manage Anxiety and Stress issues faced Manage depression symptoms  ? ?Timeframe:  Short-Term Goal ?Priority:  Medium ?Progress: On Track ?Start Date:    06/20/21                          ?Expected End Date:                  ?09/17/21 ? ?Follow Up Date    08/14/21 at 3:00 PM    ? ?Protect My Health (Patient) Manage Anxiety and Stress issues faced . Manage depression symptoms ?  ?Why is this important?   ?Screening tests can find diseases early when they are easier to treat.  ?Your doctor or nurse will talk with you about which tests are important for you.  ?Getting shots for common diseases like the flu and shingles will help prevent them.   ?   ?Patient Self Care Activities:  ?Self administers medications as prescribed ?Attends all scheduled provider appointments ?Performs ADL's independently ? ?Patient Coping Strengths:  ?Family ?Friends ? ?Patient Self Care Deficits:  ?Some pain issues ?Some mobility issues ? ?Patient Goals:  ?- spend time or talk with others every day ?- practice relaxation or meditation daily ?- keep a calendar with appointment dates ? ?Follow Up Plan: LCSW to call client on 08/14/21 at 3:00 PM to assess client needs  at that time. ? ?[name].Ernesteen Mihalic MSW, LCSW ?Licensed Clinical Social Worker ?[facility] ?[phone]  ?

## 2021-06-20 NOTE — Chronic Care Management (AMB) (Signed)
?Chronic Care Management  ? ? Clinical Social Work Note ? ?06/20/2021 ?Name: Tonya Carpenter MRN: [id number] DOB: [birth year] ? ?Tonya Carpenter is a 76 y.o. year old female who is a primary care patient of [name], Fransisca Kaufmann, MD. The CCM team was consulted to assist the patient with chronic disease management and/or care coordination needs related to: [name] .  ? ?Engaged with patient by telephone for follow up visit in response to provider referral for social work chronic care management and care coordination services.  ? ?Consent to Services:  ?The patient was given information about Chronic Care Management services, agreed to services, and gave verbal consent prior to initiation of services.  Please see initial visit note for detailed documentation.  ? ?Patient agreed to services and consent obtained.  ? ?Assessment: Review of patient past medical history, allergies, medications, and health status, including review of relevant consultants reports was performed today as part of a comprehensive evaluation and provision of chronic care management and care coordination services.    ? ?SDOH (Social Determinants of Health) assessments and interventions performed:  ?SDOH Interventions   ? ?Flowsheet Row Most Recent Value  ?SDOH Interventions   ?Physical Activity Interventions Other (Comments)  [walking challenges. she has a cane to use to help her walk]  ?Stress Interventions Provide Counseling  [client has stress related to managing medical needs.has stress related to in home care needs]  ?Depression Interventions/Treatment  Counseling  ? ?  ?  ? ?Advanced Directives Status: See Vynca application for related entries. ? ?CCM Care Plan ? ?Allergies  ?Allergen Reactions  ? Trilyte [Peg 3350-Kcl-Na Bicarb-Nacl]   ?  Nausea/vomtiing  ? ? ?Outpatient Encounter Medications as of 06/20/2021  ?Medication Sig  ? ACCU-CHEK GUIDE test strip TEST BLOOD SUGAR TWICE DAILY AND AS NEEDED FOR DIABETES  ? Accu-Chek Softclix Lancets  lancets TEST BLOOD SUGAR TWICE DAILY AND AS NEEDED FOR DIABETES  ? acetaminophen (TYLENOL) 500 MG tablet Take 1,000 mg by mouth every 6 (six) hours as needed for mild pain or headache.   ? Alcohol Swabs (DROPSAFE ALCOHOL PREP) 70 % PADS USE  TO CHECK BLOOD SUGAR TWICE DAILY  AND AS NEEDED  ? aspirin (ASPIRIN LOW DOSE) 81 MG EC tablet Take 1 tablet (81 mg total) by mouth daily. Swallow whole.  ? atorvastatin (LIPITOR) 20 MG tablet Take 1 tablet (20 mg total) by mouth daily.  ? Blood Glucose Calibration (TRUE METRIX LEVEL 1) Low SOLN Use as needed with BS machine  ? Blood Glucose Monitoring Suppl (ACCU-CHEK GUIDE) w/Device KIT USE AS DIRECTED FOR DIABETES  ? Carboxymethylcellul-Glycerin (REFRESH RELIEVA OP) Apply to eye.  ? cholecalciferol (VITAMIN D) 25 MCG (1000 UNIT) tablet Take 1 tablet (1,000 Units total) by mouth daily.  ? diclofenac Sodium (VOLTAREN) 1 % GEL Apply 2 g topically 4 (four) times daily as needed.  ? hydrALAZINE (APRESOLINE) 10 MG tablet TAKE 1 TABLET TWICE DAILY  ? JANUMET XR 50-500 MG TB24 TAKE 1 TABLET TWICE DAILY  ? Lancet Devices (TRUEDRAW LANCING DEVICE) MISC Teset BS BID Dx E11.9  ? losartan-hydrochlorothiazide (HYZAAR) 50-12.5 MG tablet TAKE 1 TABLET EVERY DAY  ? metoprolol succinate (TOPROL-XL) 50 MG 24 hr tablet TAKE 1 TABLET (50 MG TOTAL) DAILY. TAKE WITH OR IMMEDIATELY FOLLOWING A MEAL.  ? nitroGLYCERIN (NITROSTAT) 0.4 MG SL tablet DISSOLVE 1 TABLET UNDER THE TONGUE EVERY 5 MINUTES AS NEEDED FOR CHEST PAIN  ? omeprazole (PRILOSEC) 40 MG capsule Take 1 capsule (40 mg total) by mouth  daily.  ? polyethylene glycol (MIRALAX / GLYCOLAX) 17 g packet Take 17 g by mouth daily as needed.  ? Prodigy Twist Top Lancets 28G MISC TEST BLOOD SUGAR TWICE DAILY Dx E11.9  ? TRUEplus Lancets 30G MISC Teset BS BID Dx E11.9  ? ?No facility-administered encounter medications on file as of 06/20/2021.  ? ? ?Patient Active Problem List  ? Diagnosis Date Noted  ? CKD stage 3 due to type 2 diabetes mellitus ([place])  04/07/2020  ? LBBB (left bundle branch block) 07/20/2019  ? GERD (gastroesophageal reflux disease) 10/20/2013  ? Coronary atherosclerosis of native coronary artery 01/14/2013  ? CARDIAC MURMUR 07/03/2009  ? Type 2 diabetes mellitus ([place]) 09/12/2008  ? Hyperlipidemia associated with type 2 diabetes mellitus ([place]) 09/12/2008  ? Hypertension associated with diabetes ([place]) 09/12/2008  ? ? ?Conditions to be addressed/monitored: monitor client management of anxiety issues and of stress issues ? ?Care Plan : LCSW Care Plan  ?Updates made by Katha Cabal, LCSW since 06/20/2021 12:00 AM  ?  ? ?Problem: Anxiety Identification (Anxiety)   ?  ? ?Goal: manage anxiety and stress symptoms. Manage depression symptoms   ?Start Date: 06/20/2021  ?Expected End Date: 09/17/2021  ?This Visit's Progress: On track  ?Recent Progress: On track  ?Priority: Medium  ?Note:   ?Current Barriers:  ?Chronic Mental Health needs related to management of anxiety and stress issues ?Mobility issues ?Pain Issues ?Depression issues.  ?Suicidal Ideation/Homicidal Ideation: No ?In home care needs ? ?Clinical Social Work Goal(s):  ?patient will work with SW monthly by telephone or in person to reduce or manage symptoms related to anxiety and stress issues faced ?patient will communicate with RNCM or LCSW as needed in next 30 days for CCM support ?Patient will communicate with local DSS Medicaid caseworker as needed to determine client eligibility for Medicaid ?Patient will talk with RNCM as needed to discuss nursing needs of client ? ?Interventions: ?1:1 collaboration with [name], Fransisca Kaufmann, MD regarding development and update of comprehensive plan of care as evidenced by provider attestation and co-signature ?Discussed pain issues with client. Discussed mobility issues of client. Client said she uses a cane , as needed, to help her walk ?Discussed sleeping issues of client.  Client said sometimes she has reduced sleep ?Reviewed medication procurement for  client ?Encouraged client to call RNCM as needed for CCM nursing support ?Discussed client support from cardiologist She said she has an appointment with cardiologist on July 11, 2021 ?Discussed client use of [employer] bus for transport assist ?Discussed client use of [facility] Lucianne Lei as needed for transport assistance. ?Discussed Food Stamps benefit with client. Client said she does get a Food Stamps benefit each month and that using Food Stamps benefit is helpful to her ?Discussed family support. Adalind said she has support from her son [name]. She said she also has support from her son, Darnelle Maffucci ?Discussed client communication with DSS Medicaid caseworker. Makaylyn said that DSS caseworker has told Liliana that she earns too much at this time to qualify for Medicaid. ?Provided counseling support for client ? ?Patient Self Care Activities:  ?Self administers medications as prescribed ?Attends all scheduled provider appointments ?Performs ADL's independently ? ?Patient Coping Strengths:  ?Family ?Friends ? ?Patient Self Care Deficits:  ?Some pain issues ?Some mobility issues ? ?Patient Goals:  ?- spend time or talk with others every day ?- practice relaxation or meditation daily ?- keep a calendar with appointment dates ? ?Follow Up Plan: LCSW to call client on 08/14/21 at 3:00  PM to assess client needs at that time.  ?  ?  ?[name].Tacari Repass MSW, LCSW ?Licensed Clinical Social Worker ?[facility] ?[phone] ?

## 2021-06-28 ENCOUNTER — Ambulatory Visit (INDEPENDENT_AMBULATORY_CARE_PROVIDER_SITE_OTHER): Payer: Medicare HMO

## 2021-06-28 VITALS — Wt 145.0 lb

## 2021-06-28 DIAGNOSIS — Z0000 Encounter for general adult medical examination without abnormal findings: Secondary | ICD-10-CM | POA: Diagnosis not present

## 2021-06-28 NOTE — Progress Notes (Signed)
? ?Subjective:  ? Tonya Carpenter is a 76 y.o. female who presents for Medicare Annual (Subsequent) preventive examination. ? ?Virtual Visit via Telephone Note ? ?I connected with  Tonya Carpenter on 06/28/21 at 11:15 AM EDT by telephone and verified that I am speaking with the correct person using two identifiers. ? ?Location: ?Patient: Home ?Provider: [facility] ?Persons participating in the virtual visit: patient/Nurse Health Advisor ?  ?I discussed the limitations, risks, security and privacy concerns of performing an evaluation and management service by telephone and the availability of in person appointments. The patient expressed understanding and agreed to proceed. ? ?Interactive audio and video telecommunications were attempted between this nurse and patient, however failed, due to patient having technical difficulties OR patient did not have access to video capability.  We continued and completed visit with audio only. ? ?Some vital signs may be absent or patient reported.  ? ?Chancy Smigiel Dionne Ano, LPN  ? ?Review of Systems    ? ?Cardiac Risk Factors include: advanced age (>9mn, >>108women);diabetes mellitus;dyslipidemia;hypertension;Other (see comment), Risk factor comments: CAD ? ?   ?Objective:  ?  ?Today's Vitals  ? 06/28/21 1127  ?Weight: 145 lb (65.8 kg)  ? ?Body mass index is 25.69 kg/m?. ? ? ?  06/28/2021  ? 11:38 AM 06/27/2020  ? 10:32 AM 09/24/2019  ?  5:08 PM 06/25/2019  ? 10:04 AM 03/31/2019  ?  9:40 AM 12/21/2018  ?  2:02 PM 02/24/2018  ? 10:52 AM  ?Advanced Directives  ?Does Patient Have a Medical Advance Directive? _0  No No  ?Would patient like information on creating a medical advance directive? No - Patient declined No - Patient declined No - Patient declined No - Patient declined No - Patient declined  No - Patient declined  ? ? ?Current Medications (verified) ?Outpatient Encounter Medications as of 06/28/2021  ?Medication Sig  ? ACCU-CHEK GUIDE test strip TEST BLOOD SUGAR TWICE DAILY AND AS NEEDED FOR  DIABETES  ? Accu-Chek Softclix Lancets lancets TEST BLOOD SUGAR TWICE DAILY AND AS NEEDED FOR DIABETES  ? acetaminophen (TYLENOL) 500 MG tablet Take 1,000 mg by mouth every 6 (six) hours as needed for mild pain or headache.   ? Alcohol Swabs (DROPSAFE ALCOHOL PREP) 70 % PADS USE  TO CHECK BLOOD SUGAR TWICE DAILY  AND AS NEEDED  ? aspirin (ASPIRIN LOW DOSE) 81 MG EC tablet Take 1 tablet (81 mg total) by mouth daily. Swallow whole.  ? atorvastatin (LIPITOR) 20 MG tablet Take 1 tablet (20 mg total) by mouth daily.  ? Blood Glucose Calibration (TRUE METRIX LEVEL 1) Low SOLN Use as needed with BS machine  ? Blood Glucose Monitoring Suppl (ACCU-CHEK GUIDE) w/Device KIT USE AS DIRECTED FOR DIABETES  ? Carboxymethylcellul-Glycerin (REFRESH RELIEVA OP) Apply to eye.  ? cholecalciferol (VITAMIN D) 25 MCG (1000 UNIT) tablet Take 1 tablet (1,000 Units total) by mouth daily.  ? diclofenac Sodium (VOLTAREN) 1 % GEL Apply 2 g topically 4 (four) times daily as needed.  ? hydrALAZINE (APRESOLINE) 10 MG tablet TAKE 1 TABLET TWICE DAILY  ? JANUMET XR 50-500 MG TB24 TAKE 1 TABLET TWICE DAILY  ? Lancet Devices (TRUEDRAW LANCING DEVICE) MISC Teset BS BID Dx E11.9  ? losartan-hydrochlorothiazide (HYZAAR) 50-12.5 MG tablet TAKE 1 TABLET EVERY DAY  ? metoprolol succinate (TOPROL-XL) 50 MG 24 hr tablet TAKE 1 TABLET (50 MG TOTAL) DAILY. TAKE WITH OR IMMEDIATELY FOLLOWING A MEAL.  ? nitroGLYCERIN (NITROSTAT) 0.4 MG SL tablet DISSOLVE  1 TABLET UNDER THE TONGUE EVERY 5 MINUTES AS NEEDED FOR CHEST PAIN  ? omeprazole (PRILOSEC) 40 MG capsule Take 1 capsule (40 mg total) by mouth daily.  ? polyethylene glycol (MIRALAX / GLYCOLAX) 17 g packet Take 17 g by mouth daily as needed.  ? Prodigy Twist Top Lancets 28G MISC TEST BLOOD SUGAR TWICE DAILY Dx E11.9  ? TRUEplus Lancets 30G MISC Teset BS BID Dx E11.9  ? ?No facility-administered encounter medications on file as of 06/28/2021.  ? ? ?Allergies (verified) ?Trilyte [peg 3350-kcl-na bicarb-nacl]   ? ?History: ?Past Medical History:  ?Diagnosis Date  ? Anxiety   ? Arthritis   ? "hands" (01/14/2013)  ? Asthma   ? Cataract   ? Coronary artery disease   ? Non obstructive CAD 2014 cath.   ? Exertional shortness of breath   ? Hyperlipidemia   ? Hypertension   ? Stroke [facility])   ? "light stroke long years ago; didn't affect me permanently" (01/14/2013)  ? Type 2 diabetes mellitus ([place])   ? ?Past Surgical History:  ?Procedure Laterality Date  ? APPENDECTOMY    ? CARDIAC CATHETERIZATION  2008  ? no angiographically significant CAD  ? CARDIOVASCULAR STRESS TEST  12/2009  ? no evidence for stress-induced reversibility or ischemia.  She had a fixed anterior septal wall defect, possibly related to breast attenuation and her EF was 54%.  ? CATARACT EXTRACTION W/PHACO Right 10/25/2014  ? Procedure: CATARACT EXTRACTION PHACO AND INTRAOCULAR LENS PLACEMENT; CDE:  17.28;  Surgeon: Rutherford Guys, MD;  Location: [facility] OR[facility];  Service: Ophthalmology;  Laterality: Right;  ? CATARACT EXTRACTION W/PHACO Left 11/08/2014  ? Procedure: CATARACT EXTRACTION PHACO AND INTRAOCULAR LENS PLACEMENT ([facility]);  Surgeon: Rutherford Guys, MD;  Location: [facility] OR[facility];  Service: Ophthalmology;  Laterality: Left;  CDE: 5.94  ? CHOLECYSTECTOMY    ? COLONOSCOPY N/A 03/31/2019  ? Procedure: COLONOSCOPY;  Surgeon: Danie Binder, MD;  Location: [facility] END[facility] SU[facility];  Service: Endoscopy;  Laterality: N/A;  10:30  ? DILATION AND CURETTAGE OF UTERUS    ? LEFT HEART CATHETERIZATION WITH CORONARY ANGIOGRAM N/A 01/14/2013  ? Procedure: LEFT HEART CATHETERIZATION WITH CORONARY ANGIOGRAM;  Surgeon: Burnell Blanks, MD;  Location[facility] C[facility] LAB;  Service: Cardiovascular;  Laterality: N/A;  ? TONSILLECTOMY    ? TUBAL LIGATION    ? VAGINAL HYSTERECTOMY    ? ?Family History  ?Problem Relation Age of Onset  ? Hypertension Mother   ? Hypertension Father   ? Cancer Sister   ?     breast  ? Heart Problems Son   ? Seizures Son   ? ?Social History  ? ?Socioeconomic History  ? Marital status:  Widowed  ?  Spouse name: Not on file  ? Number of children: 2  ? Years of education: Not on file  ? Highest education level: Not on file  ?Occupational History  ? Occupation: Disability  ?  Employer: UNEMPLOYED  ?Tobacco Use  ? Smoking status: Never  ? Smokeless tobacco: Never  ?Substance and Sexual Activity  ? Alcohol use: No  ? Drug use: No  ? Sexual activity: Not on file  ?Other Topics Concern  ? Not on file  ?Social History Narrative  ? Lives in [place], [place] alone - her 2 sons live in [place] and visit frequently  ? ?Social Determinants of Health  ? ?Financial Resource Strain: Low Risk   ? Difficulty of Paying Living Expenses: Not very hard  ?Food Insecurity: No Food Insecurity  ?  Worried About Charity fundraiser in the Last Year: Never true  ? Ran Out of Food in the Last Year: Never true  ?Transportation Needs: No Transportation Needs  ? Lack of Transportation (Medical): No  ? Lack of Transportation (Non-Medical): No  ?Physical Activity: Insufficiently Active  ? Days of Exercise per Week: 7 days  ? Minutes of Exercise per Session: 20 min  ?Stress: Stress Concern Present  ? Feeling of Stress : To some extent  ?Social Connections: Moderately Integrated  ? Frequency of Communication with Friends and Family: More than three times a week  ? Frequency of Social Gatherings with Friends and Family: Twice a week  ? Attends Religious Services: More than 4 times per year  ? Active Member of Clubs or Organizations: Yes  ? Attends Archivist Meetings: More than 4 times per year  ? Marital Status: Widowed  ? ? ?Tobacco Counseling ?Counseling given: Not Answered ? ? ?Clinical Intake: ? ?Pre-visit preparation completed: Yes ? ?Pain : No/denies pain ? ?  ? ?BMI - recorded: 25.69 ?Nutritional Status: BMI 25 -29 Overweight ?Nutritional Risks: Nausea/ vomitting/ diarrhea (nausea, diarrhea - intermittent) ?Diabetes: Yes ?CBG done?: No ?Did pt. bring in CBG monitor from home?: No ? ?How often do you need to have someone  help you when you read instructions, pamphlets, or other written materials from your doctor or pharmacy?: 2 - Rarely ?What is the last grade level you completed in school?: 12 - she struggles with reading ? ?Dia

## 2021-06-28 NOTE — Patient Instructions (Signed)
Tonya Carpenter , ?Thank you for taking time to come for your Medicare Wellness Visit. I appreciate your ongoing commitment to your health goals. Please review the following plan we discussed and let me know if I can assist you in the future.  ? ?Screening recommendations/referrals: ?Colonoscopy: Done 04/18/2019 - no repeat required ?Mammogram: Done 05/23/2021 - Repeat annually  ?Bone Density: Done 02/25/2018 - Repeat in 5 years  ?Recommended yearly ophthalmology/optometry visit for glaucoma screening and checkup ?Recommended yearly dental visit for hygiene and checkup ? ?Vaccinations: ?Influenza vaccine: Done 12/07/2020 - Repeat annually ?Pneumococcal vaccine: Done 12/23/2012 & 05/25/2014 ?Tdap vaccine: Done 09/25/2019 - Repeat in 10 years ?Shingles vaccine: Done 02/24/2018 & 10/26/2020   ?Covid-19:Done  05/13/2019, 06/09/2019, 01/22/2020, & 01/02/2021 ? ?Advanced directives: Advance directive discussed with you today. Even though you declined this today, please call our office should you change your mind, and we can give you the proper paperwork for you to fill out.  ? ?Conditions/risks identified: Aim for 30 minutes of exercise or brisk walking, 6-8 glasses of water, and 5 servings of fruits and vegetables each day.  ? ?Next appointment: Follow up in one year for your annual wellness visit  ? ? ?Preventive Care 75 Years and Older, Female ?Preventive care refers to lifestyle choices and visits with your health care provider that can promote health and wellness. ?What does preventive care include? ?A yearly physical exam. This is also called an annual well check. ?Dental exams once or twice a year. ?Routine eye exams. Ask your health care provider how often you should have your eyes checked. ?Personal lifestyle choices, including: ?Daily care of your teeth and gums. ?Regular physical activity. ?Eating a healthy diet. ?Avoiding tobacco and drug use. ?Limiting alcohol use. ?Practicing safe sex. ?Taking low-dose aspirin every  day. ?Taking vitamin and mineral supplements as recommended by your health care provider. ?What happens during an annual well check? ?The services and screenings done by your health care provider during your annual well check will depend on your age, overall health, lifestyle risk factors, and family history of disease. ?Counseling  ?Your health care provider may ask you questions about your: ?Alcohol use. ?Tobacco use. ?Drug use. ?Emotional well-being. ?Home and relationship well-being. ?Sexual activity. ?Eating habits. ?History of falls. ?Memory and ability to understand (cognition). ?Work and work Astronomer. ?Reproductive health. ?Screening  ?You may have the following tests or measurements: ?Height, weight, and BMI. ?Blood pressure. ?Lipid and cholesterol levels. These may be checked every 5 years, or more frequently if you are over 75 years old. ?Skin check. ?Lung cancer screening. You may have this screening every year starting at age 75 if you have a 30-pack-year history of smoking and currently smoke or have quit within the past 15 years. ?Fecal occult blood test (FOBT) of the stool. You may have this test every year starting at age 75. ?Flexible sigmoidoscopy or colonoscopy. You may have a sigmoidoscopy every 5 years or a colonoscopy every 10 years starting at age 75. ?Hepatitis C blood test. ?Hepatitis B blood test. ?Sexually transmitted disease (STD) testing. ?Diabetes screening. This is done by checking your blood sugar (glucose) after you have not eaten for a while (fasting). You may have this done every 1-3 years. ?Bone density scan. This is done to screen for osteoporosis. You may have this done starting at age 75. ?Mammogram. This may be done every 1-2 years. Talk to your health care provider about how often you should have regular mammograms. ?Talk with your health care  provider about your test results, treatment options, and if necessary, the need for more tests. ?Vaccines  ?Your health care  provider may recommend certain vaccines, such as: ?Influenza vaccine. This is recommended every year. ?Tetanus, diphtheria, and acellular pertussis (Tdap, Td) vaccine. You may need a Td booster every 10 years. ?Zoster vaccine. You may need this after age 75. ?Pneumococcal 13-valent conjugate (PCV13) vaccine. One dose is recommended after age 75. ?Pneumococcal polysaccharide (PPSV23) vaccine. One dose is recommended after age 75. ?Talk to your health care provider about which screenings and vaccines you need and how often you need them. ?This information is not intended to replace advice given to you by your health care provider. Make sure you discuss any questions you have with your health care provider. ?Document Released: 03/31/2015 Document Revised: 11/22/2015 Document Reviewed: 01/03/2015 ?Elsevier Interactive Patient Education ? 2017 [employer] ? ?Fall Prevention in the Home ?Falls can cause injuries. They can happen to people of all ages. There are many things you can do to make your home safe and to help prevent falls. ?What can I do on the outside of my home? ?Regularly fix the edges of walkways and driveways and fix any cracks. ?Remove anything that might make you trip as you walk through a door, such as a raised step or threshold. ?Trim any bushes or trees on the path to your home. ?Use bright outdoor lighting. ?Clear any walking paths of anything that might make someone trip, such as rocks or tools. ?Regularly check to see if handrails are loose or broken. Make sure that both sides of any steps have handrails. ?Any raised decks and porches should have guardrails on the edges. ?Have any leaves, snow, or ice cleared regularly. ?Use sand or salt on walking paths during winter. ?Clean up any spills in your garage right away. This includes oil or grease spills. ?What can I do in the bathroom? ?Use night lights. ?Install grab bars by the toilet and in the tub and shower. Do not use towel bars as grab  bars. ?Use non-skid mats or decals in the tub or shower. ?If you need to sit down in the shower, use a plastic, non-slip stool. ?Keep the floor dry. Clean up any water that spills on the floor as soon as it happens. ?Remove soap buildup in the tub or shower regularly. ?Attach bath mats securely with double-sided non-slip rug tape. ?Do not have throw rugs and other things on the floor that can make you trip. ?What can I do in the bedroom? ?Use night lights. ?Make sure that you have a light by your bed that is easy to reach. ?Do not use any sheets or blankets that are too big for your bed. They should not hang down onto the floor. ?Have a firm chair that has side arms. You can use this for support while you get dressed. ?Do not have throw rugs and other things on the floor that can make you trip. ?What can I do in the kitchen? ?Clean up any spills right away. ?Avoid walking on wet floors. ?Keep items that you use a lot in easy-to-reach places. ?If you need to reach something above you, use a strong step stool that has a grab bar. ?Keep electrical cords out of the way. ?Do not use floor polish or wax that makes floors slippery. If you must use wax, use non-skid floor wax. ?Do not have throw rugs and other things on the floor that can make you trip. ?What can I  do with my stairs? ?Do not leave any items on the stairs. ?Make sure that there are handrails on both sides of the stairs and use them. Fix handrails that are broken or loose. Make sure that handrails are as long as the stairways. ?Check any carpeting to make sure that it is firmly attached to the stairs. Fix any carpet that is loose or worn. ?Avoid having throw rugs at the top or bottom of the stairs. If you do have throw rugs, attach them to the floor with carpet tape. ?Make sure that you have a light switch at the top of the stairs and the bottom of the stairs. If you do not have them, ask someone to add them for you. ?What else can I do to help prevent  falls? ?Wear shoes that: ?Do not have high heels. ?Have rubber bottoms. ?Are comfortable and fit you well. ?Are closed at the toe. Do not wear sandals. ?If you use a stepladder: ?Make sure that it is fully opened. Do not climb

## 2021-07-09 NOTE — Progress Notes (Signed)
?  ?Cardiology Office Note ? ? ?Date:  07/11/2021  ? ?ID:  Tonya Carpenter, DOB 04/24/1945, MRN [id number] ? ?PCP:  [name], Fransisca Kaufmann, MD  ?Cardiologist:   Minus Breeding, MD ?Referring:  [name], Fransisca Kaufmann, MD ? ?Chief Complaint  ?Patient presents with  ? Abnormal ECG  ? ? ?  ?History of Present Illness: ?Tonya Carpenter is a 76 y.o. female who is referred by [name], Fransisca Kaufmann, MD for evaluation of chest pain.   She has had cardiac work up in the past.  Echo in 2011 demonstrated a low normal EF.  There was a small pericardial effusion.   Cardiac cath in 2014 demonstrated only mild coronary plaque.   ? ?Since I last saw her she has done okay.  She has no new cardiovascular complaints.  She walks a lot to get from one place to the other. The patient denies any new symptoms such as chest discomfort, neck or arm discomfort. There has been no new shortness of breath, PND or orthopnea. There have been no reported palpitations, presyncope or syncope.   She has a chronic sporadic chest pain history and rarely has to use sublingual nitroglycerin.  This has not increased in frequency or intensity. ? ? ?Past Medical History:  ?Diagnosis Date  ? Anxiety   ? Arthritis   ? "hands" (01/14/2013)  ? Asthma   ? Cataract   ? Coronary artery disease   ? Non obstructive CAD 2014 cath.   ? Exertional shortness of breath   ? Hyperlipidemia   ? Hypertension   ? Stroke [facility])   ? "light stroke long years ago; didn't affect me permanently" (01/14/2013)  ? Type 2 diabetes mellitus ([place])   ? ? ?Past Surgical History:  ?Procedure Laterality Date  ? APPENDECTOMY    ? CARDIAC CATHETERIZATION  2008  ? no angiographically significant CAD  ? CARDIOVASCULAR STRESS TEST  12/2009  ? no evidence for stress-induced reversibility or ischemia.  She had a fixed anterior septal wall defect, possibly related to breast attenuation and her EF was 54%.  ? CATARACT EXTRACTION W/PHACO Right 10/25/2014  ? Procedure: CATARACT EXTRACTION PHACO AND INTRAOCULAR LENS  PLACEMENT; CDE:  17.28;  Surgeon: Rutherford Guys, MD;  Location: [facility];  Service: Ophthalmology;  Laterality: Right;  ? CATARACT EXTRACTION W/PHACO Left 11/08/2014  ? Procedure: CATARACT EXTRACTION PHACO AND INTRAOCULAR LENS PLACEMENT (IOC);  Surgeon: Rutherford Guys, MD;  Location: [facility];  Service: Ophthalmology;  Laterality: Left;  CDE: 5.94  ? CHOLECYSTECTOMY    ? COLONOSCOPY N/A 03/31/2019  ? Procedure: COLONOSCOPY;  Surgeon: Danie Binder, MD;  Location: [facility] SUITE;  Service: Endoscopy;  Laterality: N/A;  10:30  ? DILATION AND CURETTAGE OF UTERUS    ? LEFT HEART CATHETERIZATION WITH CORONARY ANGIOGRAM N/A 01/14/2013  ? Procedure: LEFT HEART CATHETERIZATION WITH CORONARY ANGIOGRAM;  Surgeon: Burnell Blanks, MD;  Location: [facility] CATH LAB;  Service: Cardiovascular;  Laterality: N/A;  ? TONSILLECTOMY    ? TUBAL LIGATION    ? VAGINAL HYSTERECTOMY    ? ? ? ?Current Outpatient Medications  ?Medication Sig Dispense Refill  ? aspirin (ASPIRIN LOW DOSE) 81 MG EC tablet Take 1 tablet (81 mg total) by mouth daily. Swallow whole. 90 tablet 1  ? atorvastatin (LIPITOR) 20 MG tablet Take 1 tablet (20 mg total) by mouth daily. 90 tablet 3  ? Carboxymethylcellul-Glycerin (REFRESH RELIEVA OP) Apply to eye.    ? cholecalciferol (VITAMIN D) 25 MCG (1000 UNIT) tablet Take  1 tablet (1,000 Units total) by mouth daily. 90 tablet 3  ? diclofenac Sodium (VOLTAREN) 1 % GEL Apply 2 g topically 4 (four) times daily as needed. 350 g 5  ? hydrALAZINE (APRESOLINE) 10 MG tablet TAKE 1 TABLET TWICE DAILY 180 tablet 0  ? JANUMET XR 50-500 MG TB24 TAKE 1 TABLET TWICE DAILY 180 tablet 0  ? losartan-hydrochlorothiazide (HYZAAR) 50-12.5 MG tablet TAKE 1 TABLET EVERY DAY 90 tablet 1  ? metoprolol succinate (TOPROL-XL) 50 MG 24 hr tablet TAKE 1 TABLET (50 MG TOTAL) DAILY. TAKE WITH OR IMMEDIATELY FOLLOWING A MEAL. 90 tablet 1  ? nitroGLYCERIN (NITROSTAT) 0.4 MG SL tablet DISSOLVE 1 TABLET UNDER THE TONGUE EVERY 5 MINUTES AS NEEDED FOR CHEST PAIN  25 tablet 2  ? omeprazole (PRILOSEC) 40 MG capsule Take 1 capsule (40 mg total) by mouth daily. 90 capsule 3  ? polyethylene glycol (MIRALAX / GLYCOLAX) 17 g packet Take 17 g by mouth daily as needed. 100 each 1  ? ACCU-CHEK GUIDE test strip TEST BLOOD SUGAR TWICE DAILY AND AS NEEDED FOR DIABETES 300 strip 3  ? Accu-Chek Softclix Lancets lancets TEST BLOOD SUGAR TWICE DAILY AND AS NEEDED FOR DIABETES 300 each 3  ? acetaminophen (TYLENOL) 500 MG tablet Take 1,000 mg by mouth every 6 (six) hours as needed for mild pain or headache.     ? Alcohol Swabs (DROPSAFE ALCOHOL PREP) 70 % PADS USE  TO CHECK BLOOD SUGAR TWICE DAILY  AND AS NEEDED 200 each 3  ? Blood Glucose Calibration (TRUE METRIX LEVEL 1) Low SOLN Use as needed with BS machine 3 each 1  ? Blood Glucose Monitoring Suppl (ACCU-CHEK GUIDE) w/Device KIT USE AS DIRECTED FOR DIABETES 1 kit 0  ? Lancet Devices (TRUEDRAW LANCING DEVICE) MISC Teset BS BID Dx E11.9 1 each 2  ? Prodigy Twist Top Lancets 28G MISC TEST BLOOD SUGAR TWICE DAILY Dx E11.9 200 each 3  ? TRUEplus Lancets 30G MISC Teset BS BID Dx E11.9 200 each 3  ? ?No current facility-administered medications for this visit.  ? ? ?Allergies:   Trilyte [peg [id number]-[id number] bicarb-nacl]  ? ? ?ROS:  Please see the history of present illness.   Otherwise, review of systems are positive for none.   All other systems are reviewed and negative.  ? ? ?PHYSICAL EXAM: ?VS:  BP 120/68   Pulse (!) 55   Ht _0  (1.6 m)   Wt 145 lb (65.8 kg)   BMI 25.69 kg/m?  , BMI Body mass index is 25.69 kg/m?. ?GENERAL:  Well appearing ?NECK:  No jugular venous distention, waveform within normal limits, carotid upstroke brisk and symmetric, no bruits, no thyromegaly ?LUNGS:  Clear to auscultation bilaterally ?CHEST:  Unremarkable ?HEART:  PMI not displaced or sustained,S1 and S2 within normal limits, no S3, no S4, no clicks, no rubs, no murmurs ?ABD:  Flat, positive bowel sounds normal in frequency in pitch, no bruits, no rebound, no  guarding, no midline pulsatile mass, no hepatomegaly, no splenomegaly ?EXT:  2 plus pulses throughout, no edema, no cyanosis no clubbing ? ? ?EKG:  EKG is  ordered today. ?Sinus rhythm, rate 55 left bundle branch block, no acute ST-T wave changes. ? ?Recent Labs: ?04/26/2021: ALT 10; BUN 13; Creatinine, Ser 1.29; Hemoglobin 11.6; Platelets 248; Potassium 3.4; Sodium 142  ? ? ?Lipid Panel ?   ?Component Value Date/Time  ? CHOL 131 04/26/2021 0948  ? CHOL 148 07/10/2012 1147  ? TRIG 98 04/26/2021 0948  ?  TRIG 85 07/10/2012 1147  ? HDL 59 04/26/2021 0948  ? HDL 51 07/10/2012 1147  ? CHOLHDL 2.2 04/26/2021 0948  ? [place] 54 04/26/2021 0948  ? [place] 80 07/10/2012 1147  ? ?  ? ?Wt Readings from Last 3 Encounters:  ?07/11/21 145 lb (65.8 kg)  ?06/28/21 145 lb (65.8 kg)  ?05/15/21 145 lb (65.8 kg)  ?  ? ? ?Other studies Reviewed: ?Additional studies/ records that were reviewed today include: Labs. ?Review of the above records demonstrates:  Please see elsewhere in the note.   ? ? ?ASSESSMENT AND PLAN: ? ?CHEST PAIN:   She has sporadic nonanginal sounding chest pain that is a stable pattern.  No change in therapy or further evaluation.  ? ?HTN: Blood pressure is controlled.  Continue current meds.  ? ?DYSLIPIDEMIA: LDL was 54 with an HDL of 59.  No change in therapy.  ? ?DM: A1c was 6.1 which is better.  No change in therapy.  ? ?LBBB:   This is chronic.   No change in therapy.  ? ?Current medicines are reviewed at length with the patient today.  The patient does not have concerns regarding medicines. ? ?The following changes have been made:   None ? ?Labs/ tests ordered today include:   None ? ?Orders Placed This Encounter  ?Procedures  ? EKG 12-Lead  ? ? ? ?Disposition:   FU with me as needed.  ?Signed, ?Minus Breeding, MD  ?07/11/2021 9:58 AM    ?[place] ? ?

## 2021-07-11 ENCOUNTER — Encounter: Payer: Self-pay | Admitting: Cardiology

## 2021-07-11 ENCOUNTER — Ambulatory Visit: Payer: Medicare HMO | Admitting: Cardiology

## 2021-07-11 VITALS — BP 120/68 | HR 55 | Ht 63.0 in | Wt 145.0 lb

## 2021-07-11 DIAGNOSIS — I10 Essential (primary) hypertension: Secondary | ICD-10-CM | POA: Diagnosis not present

## 2021-07-11 DIAGNOSIS — E118 Type 2 diabetes mellitus with unspecified complications: Secondary | ICD-10-CM

## 2021-07-11 DIAGNOSIS — R072 Precordial pain: Secondary | ICD-10-CM

## 2021-07-11 DIAGNOSIS — I447 Left bundle-branch block, unspecified: Secondary | ICD-10-CM | POA: Diagnosis not present

## 2021-07-11 NOTE — Patient Instructions (Signed)
Medication Instructions:  ?The current medical regimen is effective;  continue present plan and medications. ? ?*If you need a refill on your cardiac medications before your next appointment, please call your pharmacy* ? ?Follow-Up: ?At [facility], you and your health needs are our priority.  As part of our continuing mission to provide you with exceptional heart care, we have created designated Provider Care Teams.  These Care Teams include your primary Cardiologist (physician) and Advanced Practice Providers (APPs -  Physician Assistants and Nurse Practitioners) who all work together to provide you with the care you need, when you need it. ? ?We recommend signing up for the patient portal called "MyChart".  Sign up information is provided on this After Visit Summary.  MyChart is used to connect with patients for Virtual Visits (Telemedicine).  Patients are able to view lab/test results, encounter notes, upcoming appointments, etc.  Non-urgent messages can be sent to your provider as well.   ?To learn more about what you can do with MyChart, go to [email or web address].   ? ?Your next appointment:   ?Follow up as needed only. ? ?Thank you for choosing  [facility]!! ? ? ? ? ?Important Information About Sugar ? ? ? ? ?  ?

## 2021-07-15 DIAGNOSIS — E1169 Type 2 diabetes mellitus with other specified complication: Secondary | ICD-10-CM

## 2021-07-15 DIAGNOSIS — E1159 Type 2 diabetes mellitus with other circulatory complications: Secondary | ICD-10-CM

## 2021-07-15 DIAGNOSIS — I152 Hypertension secondary to endocrine disorders: Secondary | ICD-10-CM

## 2021-07-15 DIAGNOSIS — E785 Hyperlipidemia, unspecified: Secondary | ICD-10-CM

## 2021-07-15 DIAGNOSIS — I2510 Atherosclerotic heart disease of native coronary artery without angina pectoris: Secondary | ICD-10-CM

## 2021-07-26 ENCOUNTER — Ambulatory Visit (INDEPENDENT_AMBULATORY_CARE_PROVIDER_SITE_OTHER): Payer: Medicare HMO | Admitting: Family Medicine

## 2021-07-26 ENCOUNTER — Encounter: Payer: Self-pay | Admitting: Family Medicine

## 2021-07-26 VITALS — BP 134/70 | HR 59 | Ht 63.0 in | Wt 145.0 lb

## 2021-07-26 DIAGNOSIS — E785 Hyperlipidemia, unspecified: Secondary | ICD-10-CM | POA: Diagnosis not present

## 2021-07-26 DIAGNOSIS — N1832 Chronic kidney disease, stage 3b: Secondary | ICD-10-CM

## 2021-07-26 DIAGNOSIS — N183 Chronic kidney disease, stage 3 unspecified: Secondary | ICD-10-CM

## 2021-07-26 DIAGNOSIS — E1169 Type 2 diabetes mellitus with other specified complication: Secondary | ICD-10-CM | POA: Diagnosis not present

## 2021-07-26 DIAGNOSIS — E1122 Type 2 diabetes mellitus with diabetic chronic kidney disease: Secondary | ICD-10-CM | POA: Diagnosis not present

## 2021-07-26 DIAGNOSIS — E1159 Type 2 diabetes mellitus with other circulatory complications: Secondary | ICD-10-CM | POA: Diagnosis not present

## 2021-07-26 DIAGNOSIS — I152 Hypertension secondary to endocrine disorders: Secondary | ICD-10-CM

## 2021-07-26 LAB — BAYER DCA HB A1C WAIVED: HB A1C (BAYER DCA - WAIVED): 6.2 % — ABNORMAL HIGH (ref 4.8–5.6)

## 2021-07-26 MED ORDER — HYDRALAZINE HCL 10 MG PO TABS: 10.0000 mg | ORAL_TABLET | Freq: Two times a day (BID) | ORAL | 3 refills | Status: DC

## 2021-07-26 MED ORDER — JANUMET XR 50-500 MG PO TB24: 1.0000 | ORAL_TABLET | Freq: Two times a day (BID) | ORAL | 3 refills | Status: DC

## 2021-07-26 MED ORDER — METOPROLOL SUCCINATE ER 50 MG PO TB24
50.0000 mg | ORAL_TABLET | Freq: Every day | ORAL | 3 refills | Status: DC
Start: 2021-07-26 — End: 2021-12-24

## 2021-07-26 MED ORDER — LOSARTAN POTASSIUM-HCTZ 50-12.5 MG PO TABS: 1.0000 | ORAL_TABLET | Freq: Every day | ORAL | 3 refills | Status: DC

## 2021-07-26 NOTE — Progress Notes (Signed)
? ?BP 134/70   Pulse (!) 59   Ht 5' 3"  (1.6 m)   Wt 145 lb (65.8 kg)   SpO2 99%   BMI 25.69 kg/m?   ? ?Subjective:  ? ?Patient ID: Tonya Carpenter, female    DOB: Dec 02, 1945, 76 y.o.   MRN: [id number] ? ?HPI: ?Tonya Carpenter is a 76 y.o. female presenting on 07/26/2021 for Medical Management of Chronic Issues, Diabetes, Hypertension, Hyperlipidemia, and Chronic Kidney Disease ? ? ?HPI ?Type 2 diabetes mellitus ?Patient comes in today for recheck of his diabetes. Patient has been currently taking Janumet. Patient is currently on an ACE inhibitor/ARB. Patient has not seen an ophthalmologist this year. Patient denies any issues with their feet. The symptom started onset as an adult hyperlipidemia and hypertension and CKD and coronary atherosclerosis ARE RELATED TO DM  ? ?Hyperlipidemia ?Patient is coming in for recheck of his hyperlipidemia. The patient is currently taking atorvastatin. They deny any issues with myalgias or history of liver damage from it. They deny any focal numbness or weakness or chest pain.  ? ?Hypertension ?Patient is currently on hydralazine and losartan hydrochlorothiazide and metoprolol, and their blood pressure today is 134/70. Patient denies any lightheadedness or dizziness. Patient denies headaches, blurred vision, chest pains, shortness of breath, or weakness. Denies any side effects from medication and is content with current medication.  ? ?Relevant past medical, surgical, family and social history reviewed and updated as indicated. Interim medical history since our last visit reviewed. ?Allergies and medications reviewed and updated. ? ?Review of Systems  ?Constitutional:  Negative for chills and fever.  ?Eyes:  Negative for redness and visual disturbance.  ?Respiratory:  Negative for chest tightness and shortness of breath.   ?Cardiovascular:  Negative for chest pain and leg swelling.  ?Musculoskeletal:  Negative for back pain and gait problem.  ?Skin:  Negative for rash.  ?Neurological:   Negative for light-headedness and headaches.  ?Psychiatric/Behavioral:  Negative for agitation and behavioral problems.   ?All other systems reviewed and are negative. ? ?Per HPI unless specifically indicated above ? ? ?Allergies as of 07/26/2021   ? ?   Reactions  ? Trilyte [peg [id number]-kcl-[id number] Bicarb-nacl]   ? Nausea/vomtiing  ? ?  ? ?  ?Medication List  ?  ? ?  ? Accurate as of Jul 26, 2021 10:49 AM. If you have any questions, ask your nurse or doctor.  ?  ?  ? ?  ? ?Accu-Chek Guide test strip ?Generic drug: glucose blood ?TEST BLOOD SUGAR TWICE DAILY AND AS NEEDED FOR DIABETES ?  ?Accu-Chek Guide w/Device Kit ?USE AS DIRECTED FOR DIABETES ?  ?acetaminophen 500 MG tablet ?Commonly known as: TYLENOL ?Take 1,000 mg by mouth every 6 (six) hours as needed for mild pain or headache. ?  ?aspirin 81 MG EC tablet ?Commonly known as: Aspirin Low Dose ?Take 1 tablet (81 mg total) by mouth daily. Swallow whole. ?  ?atorvastatin 20 MG tablet ?Commonly known as: LIPITOR ?Take 1 tablet (20 mg total) by mouth daily. ?  ?cholecalciferol 25 MCG (1000 UNIT) tablet ?Commonly known as: VITAMIN D ?Take 1 tablet (1,000 Units total) by mouth daily. ?  ?diclofenac Sodium 1 % Gel ?Commonly known as: VOLTAREN ?Apply 2 g topically 4 (four) times daily as needed. ?  ?DropSafe Alcohol Prep 70 % Pads ?USE  TO CHECK BLOOD SUGAR TWICE DAILY  AND AS NEEDED ?  ?hydrALAZINE 10 MG tablet ?Commonly known as: APRESOLINE ?Take 1 tablet (10 mg total) by  mouth 2 (two) times daily. ?  ?Janumet XR 50-500 MG Tb24 ?Generic drug: SitaGLIPtin-MetFORMIN HCl ?Take 1 tablet by mouth 2 (two) times daily. ?  ?losartan-hydrochlorothiazide 50-12.5 MG tablet ?Commonly known as: HYZAAR ?Take 1 tablet by mouth daily. ?  ?metoprolol succinate 50 MG 24 hr tablet ?Commonly known as: TOPROL-XL ?Take 1 tablet (50 mg total) by mouth daily. Take with or immediately following a meal. ?What changed: See the new instructions. ?Changed by: Worthy Rancher, MD ?  ?nitroGLYCERIN 0.4  MG SL tablet ?Commonly known as: NITROSTAT ?DISSOLVE 1 TABLET UNDER THE TONGUE EVERY 5 MINUTES AS NEEDED FOR CHEST PAIN ?  ?omeprazole 40 MG capsule ?Commonly known as: PRILOSEC ?Take 1 capsule (40 mg total) by mouth daily. ?  ?polyethylene glycol 17 g packet ?Commonly known as: MIRALAX / GLYCOLAX ?Take 17 g by mouth daily as needed. ?  ?REFRESH RELIEVA OP ?Apply to eye. ?  ?True Metrix Level 1 Low Soln ?Use as needed with BS machine ?  ?TRUEdraw Lancing Device Misc ?Teset BS BID Dx E11.9 ?  ?TRUEplus Lancets 30G Misc ?Teset BS BID Dx E11.9 ?  ?Accu-Chek Softclix Lancets lancets ?TEST BLOOD SUGAR TWICE DAILY AND AS NEEDED FOR DIABETES ?  ?Prodigy Twist Top Lancets 28G Misc ?TEST BLOOD SUGAR TWICE DAILY Dx E11.9 ?  ? ?  ? ? ? ?Objective:  ? ?BP 134/70   Pulse (!) 59   Ht 5' 3"  (1.6 m)   Wt 145 lb (65.8 kg)   SpO2 99%   BMI 25.69 kg/m?   ?Wt Readings from Last 3 Encounters:  ?07/26/21 145 lb (65.8 kg)  ?07/11/21 145 lb (65.8 kg)  ?06/28/21 145 lb (65.8 kg)  ?  ?Physical Exam ?Vitals and nursing note reviewed.  ?Constitutional:   ?   General: She is not in acute distress. ?   Appearance: She is well-developed. She is not diaphoretic.  ?Eyes:  ?   Conjunctiva/sclera: Conjunctivae normal.  ?Cardiovascular:  ?   Rate and Rhythm: Normal rate and regular rhythm.  ?   Heart sounds: Normal heart sounds. No murmur heard. ?Pulmonary:  ?   Effort: Pulmonary effort is normal. No respiratory distress.  ?   Breath sounds: Normal breath sounds. No wheezing.  ?Musculoskeletal:     ?   General: No tenderness. Normal range of motion.  ?Skin: ?   General: Skin is warm and dry.  ?   Findings: No rash.  ?Neurological:  ?   Mental Status: She is alert and oriented to person, place, and time.  ?   Coordination: Coordination normal.  ?Psychiatric:     ?   Behavior: Behavior normal.  ? ? ? ? ?Assessment & Plan:  ? ?Problem List Items Addressed This Visit   ? ?  ? Cardiovascular and Mediastinum  ? Hypertension associated with diabetes  ([place])  ? Relevant Medications  ? JANUMET XR 50-500 MG TB24  ? hydrALAZINE (APRESOLINE) 10 MG tablet  ? metoprolol succinate (TOPROL-XL) 50 MG 24 hr tablet  ? losartan-hydrochlorothiazide (HYZAAR) 50-12.5 MG tablet  ?  ? Endocrine  ? Type 2 diabetes mellitus ([place])  ? Relevant Medications  ? JANUMET XR 50-500 MG TB24  ? losartan-hydrochlorothiazide (HYZAAR) 50-12.5 MG tablet  ? Other Relevant Orders  ? CBC with Differential/Platelet  ? CMP14+EGFR  ? Lipid panel  ? Bayer DCA Hb A1c Waived  ? Hyperlipidemia associated with type 2 diabetes mellitus ([place]) - Primary  ? Relevant Medications  ? JANUMET XR 50-500 MG TB24  ?  hydrALAZINE (APRESOLINE) 10 MG tablet  ? metoprolol succinate (TOPROL-XL) 50 MG 24 hr tablet  ? losartan-hydrochlorothiazide (HYZAAR) 50-12.5 MG tablet  ? Other Relevant Orders  ? CBC with Differential/Platelet  ? CMP14+EGFR  ? Lipid panel  ? Bayer DCA Hb A1c Waived  ? CKD stage 3 due to type 2 diabetes mellitus ([place])  ? Relevant Medications  ? JANUMET XR 50-500 MG TB24  ? losartan-hydrochlorothiazide (HYZAAR) 50-12.5 MG tablet  ? Other Relevant Orders  ? CBC with Differential/Platelet  ? CMP14+EGFR  ? Lipid panel  ? Bayer DCA Hb A1c Waived  ?  ?A1c is good at 7.1 continue current medicine, seems to be doing well, blood pressure looks good. ?Follow up plan: ?Return in about 3 months (around 10/26/2021), or if symptoms worsen or fail to improve, for diabetes and htn. ? ?Counseling provided for all of the vaccine components ?Orders Placed This Encounter  ?Procedures  ? CBC with Differential/Platelet  ? CMP14+EGFR  ? Lipid panel  ? Bayer DCA Hb A1c Waived  ? ? ?Caryl Pina, MD ?[place] ?07/26/2021, 10:49 AM ? ? ?  ?

## 2021-07-27 LAB — CBC WITH DIFFERENTIAL/PLATELET
Basophils Absolute: 0 10*3/uL (ref 0.0–0.2)
Basos: 0 %
EOS (ABSOLUTE): 0.1 10*3/uL (ref 0.0–0.4)
Eos: 1 %
Hematocrit: 34.8 % (ref 34.0–46.6)
Hemoglobin: 11.6 g/dL (ref 11.1–15.9)
Immature Grans (Abs): 0 10*3/uL (ref 0.0–0.1)
Immature Granulocytes: 0 %
Lymphocytes Absolute: 2.3 10*3/uL (ref 0.7–3.1)
Lymphs: 27 %
MCH: 28.2 pg (ref 26.6–33.0)
MCHC: 33.3 g/dL (ref 31.5–35.7)
MCV: 85 fL (ref 79–97)
Monocytes Absolute: 0.6 10*3/uL (ref 0.1–0.9)
Monocytes: 7 %
Neutrophils Absolute: 5.4 10*3/uL (ref 1.4–7.0)
Neutrophils: 65 %
Platelets: 266 10*3/uL (ref 150–450)
RBC: 4.12 x10E6/uL (ref 3.77–5.28)
RDW: 12.7 % (ref 11.7–15.4)
WBC: 8.3 10*3/uL (ref 3.4–10.8)

## 2021-07-27 LAB — CMP14+EGFR
ALT: 11 IU/L (ref 0–32)
AST: 16 IU/L (ref 0–40)
Albumin/Globulin Ratio: 1 — ABNORMAL LOW (ref 1.2–2.2)
Albumin: 4.1 g/dL (ref 3.7–4.7)
Alkaline Phosphatase: 60 IU/L (ref 44–121)
BUN/Creatinine Ratio: 13 (ref 12–28)
BUN: 17 mg/dL (ref 8–27)
Bilirubin Total: 0.5 mg/dL (ref 0.0–1.2)
CO2: 26 mmol/L (ref 20–29)
Calcium: 9.3 mg/dL (ref 8.7–10.3)
Chloride: 101 mmol/L (ref 96–106)
Creatinine, Ser: 1.35 mg/dL — ABNORMAL HIGH (ref 0.57–1.00)
Globulin, Total: 4 g/dL (ref 1.5–4.5)
Glucose: 110 mg/dL — ABNORMAL HIGH (ref 70–99)
Potassium: 4 mmol/L (ref 3.5–5.2)
Sodium: 139 mmol/L (ref 134–144)
Total Protein: 8.1 g/dL (ref 6.0–8.5)
eGFR: 41 mL/min/{1.73_m2} — ABNORMAL LOW (ref >59)

## 2021-07-27 LAB — LIPID PANEL
Chol/HDL Ratio: 2.7 ratio (ref 0.0–4.4)
Cholesterol, Total: 152 mg/dL (ref 100–199)
HDL: 57 mg/dL (ref >39)
LDL Chol Calc (NIH): 74 mg/dL (ref 0–99)
Triglycerides: 119 mg/dL (ref 0–149)
VLDL Cholesterol Cal: 21 mg/dL (ref 5–40)

## 2021-07-28 ENCOUNTER — Other Ambulatory Visit: Payer: Self-pay | Admitting: Family Medicine

## 2021-07-28 DIAGNOSIS — E1159 Type 2 diabetes mellitus with other circulatory complications: Secondary | ICD-10-CM

## 2021-07-28 DIAGNOSIS — K2100 Gastro-esophageal reflux disease with esophagitis, without bleeding: Secondary | ICD-10-CM

## 2021-08-09 ENCOUNTER — Other Ambulatory Visit: Payer: Self-pay | Admitting: Family Medicine

## 2021-08-09 DIAGNOSIS — I152 Hypertension secondary to endocrine disorders: Secondary | ICD-10-CM

## 2021-08-14 ENCOUNTER — Ambulatory Visit (INDEPENDENT_AMBULATORY_CARE_PROVIDER_SITE_OTHER): Payer: Medicare HMO

## 2021-08-14 DIAGNOSIS — N183 Chronic kidney disease, stage 3 unspecified: Secondary | ICD-10-CM

## 2021-08-14 DIAGNOSIS — M7541 Impingement syndrome of right shoulder: Secondary | ICD-10-CM

## 2021-08-14 DIAGNOSIS — E1169 Type 2 diabetes mellitus with other specified complication: Secondary | ICD-10-CM

## 2021-08-14 DIAGNOSIS — K2100 Gastro-esophageal reflux disease with esophagitis, without bleeding: Secondary | ICD-10-CM

## 2021-08-14 DIAGNOSIS — I2510 Atherosclerotic heart disease of native coronary artery without angina pectoris: Secondary | ICD-10-CM

## 2021-08-14 DIAGNOSIS — E1159 Type 2 diabetes mellitus with other circulatory complications: Secondary | ICD-10-CM

## 2021-08-14 DIAGNOSIS — R42 Dizziness and giddiness: Secondary | ICD-10-CM

## 2021-08-14 NOTE — Patient Instructions (Addendum)
Visit Information  Patient goals;  Protect My Health (Patient).  Manage Anxiety and Stress issues faced. Manage depression symptoms  Timeframe:  Short-Term Goal Priority:  Medium Progress: On Track Start Date:    06/20/21                          Expected End Date:                  11/08/21    Follow Up Date    09/12/21 at 1:00 PM      Protect My Health (Patient) Manage Anxiety and Stress issues faced . Manage depression symptoms   Why is this important?   Screening tests can find diseases early when they are easier to treat.  Your doctor or nurse will talk with you about which tests are important for you.  Getting shots for common diseases like the flu and shingles will help prevent them.      Patient Self Care Activities:  Self administers medications as prescribed Attends all scheduled provider appointments Performs ADL's independently  Patient Coping Strengths:  Family Friends  Patient Self Care Deficits:  Some pain issues Some mobility issues  Patient Goals:  - spend time or talk with others every day - practice relaxation or meditation daily - keep a calendar with appointment dates  Follow Up Plan: LCSW to call client on 09/12/21 at 1:00 PM   [name].Nyashia Raney MSW, [place] [facility] representative [facility] Care Management [phone]

## 2021-08-14 NOTE — Chronic Care Management (AMB) (Signed)
Chronic Care Management    Clinical Social Work Note  08/14/2021 Name: Tonya Carpenter MRN: [id number] DOB: 1945-08-26  Tonya Carpenter is a 76 y.o. year old female who is a primary care patient of [name], Fransisca Kaufmann, MD. The CCM team was consulted to assist the patient with chronic disease management and/or care coordination needs related to: [name] .   Engaged with patient by telephone for follow up visit in response to provider referral for social work chronic care management and care coordination services.   Consent to Services:  The patient was given information about Chronic Care Management services, agreed to services, and gave verbal consent prior to initiation of services.  Please see initial visit note for detailed documentation.   Patient agreed to services and consent obtained.   Assessment: Review of patient past medical history, allergies, medications, and health status, including review of relevant consultants reports was performed today as part of a comprehensive evaluation and provision of chronic care management and care coordination services.     SDOH (Social Determinants of Health) assessments and interventions performed:  SDOH Interventions    Flowsheet Row Most Recent Value  SDOH Interventions   Stress Interventions Provide Counseling  [client has stress related to managing medical needs]  Depression Interventions/Treatment  Counseling        Advanced Directives Status: See Vynca application for related entries.  CCM Care Plan  Allergies  Allergen Reactions   Trilyte [Peg 3350-Kcl-Na Bicarb-Nacl]     Nausea/vomtiing    Outpatient Encounter Medications as of 08/14/2021  Medication Sig   ACCU-CHEK GUIDE test strip TEST BLOOD SUGAR TWICE DAILY AND AS NEEDED FOR DIABETES   Accu-Chek Softclix Lancets lancets TEST BLOOD SUGAR TWICE DAILY AND AS NEEDED FOR DIABETES   acetaminophen (TYLENOL) 500 MG tablet Take 1,000 mg by mouth every 6 (six) hours as  needed for mild pain or headache.    Alcohol Swabs (DROPSAFE ALCOHOL PREP) 70 % PADS USE  TO CHECK BLOOD SUGAR TWICE DAILY  AND AS NEEDED   aspirin (ASPIRIN LOW DOSE) 81 MG EC tablet Take 1 tablet (81 mg total) by mouth daily. Swallow whole.   atorvastatin (LIPITOR) 20 MG tablet Take 1 tablet (20 mg total) by mouth daily.   Blood Glucose Calibration (TRUE METRIX LEVEL 1) Low SOLN Use as needed with BS machine   Blood Glucose Monitoring Suppl (ACCU-CHEK GUIDE) w/Device KIT USE AS DIRECTED FOR DIABETES   Carboxymethylcellul-Glycerin (REFRESH RELIEVA OP) Apply to eye.   cholecalciferol (VITAMIN D) 25 MCG (1000 UNIT) tablet Take 1 tablet (1,000 Units total) by mouth daily.   diclofenac Sodium (VOLTAREN) 1 % GEL Apply 2 g topically 4 (four) times daily as needed.   hydrALAZINE (APRESOLINE) 10 MG tablet Take 1 tablet (10 mg total) by mouth 2 (two) times daily.   JANUMET XR 50-500 MG TB24 Take 1 tablet by mouth 2 (two) times daily.   Lancet Devices (TRUEDRAW LANCING DEVICE) MISC Teset BS BID Dx E11.9   losartan-hydrochlorothiazide (HYZAAR) 50-12.5 MG tablet Take 1 tablet by mouth daily.   metoprolol succinate (TOPROL-XL) 50 MG 24 hr tablet Take 1 tablet (50 mg total) by mouth daily. Take with or immediately following a meal.   nitroGLYCERIN (NITROSTAT) 0.4 MG SL tablet DISSOLVE 1 TABLET UNDER THE TONGUE EVERY 5 MINUTES AS NEEDED FOR CHEST PAIN   omeprazole (PRILOSEC) 40 MG capsule Take 1 capsule (40 mg total) by mouth daily.   polyethylene glycol (MIRALAX / GLYCOLAX) 17 g packet Take  17 g by mouth daily as needed.   Prodigy Twist Top Lancets 28G MISC TEST BLOOD SUGAR TWICE DAILY Dx E11.9   TRUEplus Lancets 30G MISC Teset BS BID Dx E11.9   No facility-administered encounter medications on file as of 08/14/2021.    Patient Active Problem List   Diagnosis Date Noted   CKD stage 3 due to type 2 diabetes mellitus ([place]) 04/07/2020   LBBB (left bundle branch block) 07/20/2019   GERD (gastroesophageal  reflux disease) 10/20/2013   Coronary atherosclerosis of native coronary artery 01/14/2013   CARDIAC MURMUR 07/03/2009   Type 2 diabetes mellitus ([place]) 09/12/2008   Hyperlipidemia associated with type 2 diabetes mellitus ([place]) 09/12/2008   Hypertension associated with diabetes ([place]) 09/12/2008    Conditions to be addressed/monitored: monitor client management of anxiety issues and of stress issues  Care Plan : LCSW Care Plan  Updates made by Katha Cabal, LCSW since 08/14/2021 12:00 AM     Problem: Anxiety Identification (Anxiety)      Goal: manage anxiety and stress symptoms. Manage depression symptoms   Start Date: 06/20/2021  Expected End Date: 11/08/2021  This Visit's Progress: On track  Recent Progress: On track  Priority: Medium  Note:   Current Barriers:  Chronic Mental Health needs related to management of anxiety and stress issues Mobility issues Pain Issues Depression issues.  Suicidal Ideation/Homicidal Ideation: No In home care needs  Clinical Social Work Goal(s):  patient will work with SW monthly by telephone or in person to reduce or manage symptoms related to anxiety and stress issues faced patient will communicate with RNCM or LCSW as needed in next 30 days for CCM support Patient will communicate with local DSS Medicaid caseworker as needed to determine client eligibility for Medicaid Patient will talk with RNCM as needed to discuss nursing needs of client  Interventions: 1:1 collaboration with [name], Fransisca Kaufmann, MD regarding development and update of comprehensive plan of care as evidenced by provider attestation and co-signature Discussed pain issues with client. [name] said she has occasional stomach pain issues . Discussed sleeping issues of client.  Client said sometimes she has reduced sleep Reviewed medication procurement for client Encouraged client to call RNCM as needed for CCM nursing support. Encouraged  [name] to call [facility] Triage Nurse as  needed for nursing support Discussed client use of [employer] bus for transport assist Discussed client use of [employer] Lucianne Lei as needed for transport assistance. Discussed Food Stamps benefit with client. Client said she does get a Food Stamps benefit each month and that using Food Stamps benefit is helpful to her Discussed family support. Amarise said she has some support from her sons Provided counseling support for client Discussed housing location. Charne said that her apartment is close to PCP office, close to pharmacy, close to [employer], close to [employer]. She said she often walks to complete errands or go to PCP appointment  Patient Self Care Activities:  Self administers medications as prescribed Attends all scheduled provider appointments Performs ADL's independently  Patient Coping Strengths:  Family Friends  Patient Self Care Deficits:  Some pain issues Some mobility issues  Patient Goals:  - spend time or talk with others every day - practice relaxation or meditation daily - keep a calendar with appointment dates  Follow Up Plan: LCSW to call client on 09/12/21 at 1:00 PM      [name].Remmi Armenteros MSW, LCSW Licensed Clinical Social Worker [facility] Care Management [phone]

## 2021-08-15 DIAGNOSIS — E1169 Type 2 diabetes mellitus with other specified complication: Secondary | ICD-10-CM

## 2021-08-15 DIAGNOSIS — I152 Hypertension secondary to endocrine disorders: Secondary | ICD-10-CM

## 2021-08-15 DIAGNOSIS — E1122 Type 2 diabetes mellitus with diabetic chronic kidney disease: Secondary | ICD-10-CM

## 2021-08-15 DIAGNOSIS — N183 Chronic kidney disease, stage 3 unspecified: Secondary | ICD-10-CM

## 2021-08-15 DIAGNOSIS — E1159 Type 2 diabetes mellitus with other circulatory complications: Secondary | ICD-10-CM

## 2021-08-15 DIAGNOSIS — E785 Hyperlipidemia, unspecified: Secondary | ICD-10-CM

## 2021-08-15 DIAGNOSIS — I2510 Atherosclerotic heart disease of native coronary artery without angina pectoris: Secondary | ICD-10-CM

## 2021-08-20 ENCOUNTER — Other Ambulatory Visit: Payer: Self-pay | Admitting: Emergency Medicine

## 2021-08-20 DIAGNOSIS — E1159 Type 2 diabetes mellitus with other circulatory complications: Secondary | ICD-10-CM

## 2021-08-20 NOTE — Telephone Encounter (Signed)
Patient needs Rx sent to [facility] instead of [facility] pharmacy where it was sent to.   Please advise.    Rhetta Cleek. RN

## 2021-08-22 MED ORDER — HYDRALAZINE HCL 10 MG PO TABS: 10.0000 mg | ORAL_TABLET | Freq: Two times a day (BID) | ORAL | 3 refills | Status: DC

## 2021-08-23 ENCOUNTER — Telehealth: Payer: Self-pay | Admitting: Family Medicine

## 2021-08-23 NOTE — Telephone Encounter (Signed)
Pt came in to the office this pm. She states that the diclofenac does help her shoulder pain. Advised to continue applying the cream a few times per day. Rotate heat and ice. Also advised to rest the arm when she could. Pt likes to mop and sweep, do laundry daily. Advised that it is good for her to continue those things but when her arm pain is flaring that she needed to rest it and hold off of the house work for a few days. Pt understood. Explained to pt to call the office for an appt in the next 2 weeks if no better.

## 2021-08-30 ENCOUNTER — Telehealth: Payer: Self-pay | Admitting: Family Medicine

## 2021-08-30 NOTE — Telephone Encounter (Signed)
We have discussed numerous times with Tonya Carpenter that her insurance will not cover in home health for her due to her income. We have advised that she would have to get help from her family or hire an aid on her own. Will make Dr. Louanne Skye aware that pt has called t omake sure he does not have any new recommendations. Will wait for [name] to return on Monday.

## 2021-09-03 NOTE — Telephone Encounter (Signed)
Yes I agree, we have already tried for home health and other options through their insurance and it was not covered.  At this point if she were to want an aide or a helper she would have to go to her family or pay on her own.

## 2021-09-04 ENCOUNTER — Other Ambulatory Visit: Payer: Self-pay | Admitting: Family Medicine

## 2021-09-04 DIAGNOSIS — E1169 Type 2 diabetes mellitus with other specified complication: Secondary | ICD-10-CM

## 2021-09-04 NOTE — Telephone Encounter (Signed)
Pt is aware of recommendations

## 2021-09-12 ENCOUNTER — Telehealth: Payer: Medicare HMO

## 2021-09-14 ENCOUNTER — Other Ambulatory Visit: Payer: Self-pay

## 2021-09-14 DIAGNOSIS — E1169 Type 2 diabetes mellitus with other specified complication: Secondary | ICD-10-CM

## 2021-09-14 MED ORDER — ATORVASTATIN CALCIUM 20 MG PO TABS: 20.0000 mg | ORAL_TABLET | Freq: Every day | ORAL | 1 refills | Status: DC

## 2021-09-24 ENCOUNTER — Ambulatory Visit: Payer: Self-pay | Admitting: *Deleted

## 2021-09-24 DIAGNOSIS — E1122 Type 2 diabetes mellitus with diabetic chronic kidney disease: Secondary | ICD-10-CM

## 2021-09-24 DIAGNOSIS — I152 Hypertension secondary to endocrine disorders: Secondary | ICD-10-CM

## 2021-09-24 NOTE — Patient Instructions (Signed)
Tonya Carpenter Faith  I have enjoyed working with you through the Chronic Care Management Program at [facility]. Due to program changes and no recent contact with the RN Care Manager, I am removing myself from your care team. If you are currently active with another CCM Team Member, you will remain active with them unless they reach out to you with additional information. If you feel that you need RN Care Management services in the future, please talk with your primary care provider to discuss re-engagement with the RN Care Manager.   Thank you for allowing me to participate in your your healthcare journey.  Demetrios Loll, BSN, RN-BC Embedded Chronic Care Manager [facility] / [facility] Care Management Direct Dial: [phone]

## 2021-09-24 NOTE — Chronic Care Management (AMB) (Signed)
  Chronic Care Management   Note  09/24/2021 Name: MILITZA DEVERY MRN: [id number] DOB: May 11, 1945   Patient has not recently engaged with the Chronic Care Management RN Care Manager. Removing RN Care Manager from Care Team and closing RN Care Management Care Plans. If patient is currently engaged with another CCM team member I will forward this encounter to inform them of my case closure. Patient may be eligible for re-engagement with RN Care Manager in the future if necessary and can discuss this with their PCP.  Demetrios Loll, BSN, RN-BC Embedded Chronic Care Manager [facility] / [facility] Care Management Direct Dial: [phone]

## 2021-09-28 DIAGNOSIS — H524 Presbyopia: Secondary | ICD-10-CM | POA: Diagnosis not present

## 2021-09-28 DIAGNOSIS — H5213 Myopia, bilateral: Secondary | ICD-10-CM | POA: Diagnosis not present

## 2021-10-01 ENCOUNTER — Ambulatory Visit (INDEPENDENT_AMBULATORY_CARE_PROVIDER_SITE_OTHER): Payer: Medicare HMO | Admitting: Licensed Clinical Social Worker

## 2021-10-01 DIAGNOSIS — E1122 Type 2 diabetes mellitus with diabetic chronic kidney disease: Secondary | ICD-10-CM

## 2021-10-01 DIAGNOSIS — I152 Hypertension secondary to endocrine disorders: Secondary | ICD-10-CM

## 2021-10-01 DIAGNOSIS — M7541 Impingement syndrome of right shoulder: Secondary | ICD-10-CM

## 2021-10-01 DIAGNOSIS — K2100 Gastro-esophageal reflux disease with esophagitis, without bleeding: Secondary | ICD-10-CM

## 2021-10-01 DIAGNOSIS — R42 Dizziness and giddiness: Secondary | ICD-10-CM

## 2021-10-01 DIAGNOSIS — E1169 Type 2 diabetes mellitus with other specified complication: Secondary | ICD-10-CM

## 2021-10-01 DIAGNOSIS — I2510 Atherosclerotic heart disease of native coronary artery without angina pectoris: Secondary | ICD-10-CM

## 2021-10-01 NOTE — Patient Instructions (Addendum)
Visit Information  Patient Goals:  Protect My Health (Patient).  Manage Anxiety and Stress issues faced. Manage depression symptoms.   Timeframe:  Short-Term Goal Priority:  Medium Progress: On Track Start Date:    06/20/21                          Expected End Date:                  12/12/21      Follow Up Date    11/14/21 at 10:00 AM      Protect My Health (Patient) Manage Anxiety and Stress issues faced . Manage depression symptoms   Why is this important?   Screening tests can find diseases early when they are easier to treat.  Your doctor or nurse will talk with you about which tests are important for you.  Getting shots for common diseases like the flu and shingles will help prevent them.      Patient Self Care Activities:  Self administers medications as prescribed Attends all scheduled provider appointments Performs ADL's independently  Patient Coping Strengths:  Family Friends  Patient Self Care Deficits:  Some pain issues Some mobility issues  Patient Goals:  - spend time or talk with others every day - practice relaxation or meditation daily - keep a calendar with appointment dates  Follow Up Plan: LCSW to call client on 11/14/21 at 10:00 AM   Tonya Carpenter.Tonya Carpenter MSW, LCSW Licensed Visual merchandiser [facility] Care Management [phone]

## 2021-10-01 NOTE — Chronic Care Management (AMB) (Signed)
Chronic Care Management    Clinical Social Work Note  10/01/2021 Name: Tonya Carpenter MRN: [id number] DOB: 1945-03-22  Tonya Carpenter is a 76 y.o. year old female who is a primary care patient of Tonya Carpenter, Tonya Kaufmann, MD. The CCM team was consulted to assist the patient with chronic disease management and/or care coordination needs related to: [name] .   Engaged with patient by telephone for follow up visit in response to provider referral for social work chronic care management and care coordination services.   Consent to Services:  The patient was given information about Chronic Care Management services, agreed to services, and gave verbal consent prior to initiation of services.  Please see initial visit note for detailed documentation.   Patient agreed to services and consent obtained.   Assessment: Review of patient past medical history, allergies, medications, and health status, including review of relevant consultants reports was performed today as part of a comprehensive evaluation and provision of chronic care management and care coordination services.     SDOH (Social Determinants of Health) assessments and interventions performed:  SDOH Interventions    Flowsheet Row Most Recent Value  SDOH Interventions   Stress Interventions Provide Counseling  [client has stress related to sleeping problems. client has stress related to in home care needs. client has stress related to managing medical needs]  Depression Interventions/Treatment  Counseling        Advanced Directives Status: See Vynca application for related entries.  CCM Care Plan  Allergies  Allergen Reactions   Trilyte [Peg 3350-Kcl-Na Bicarb-Nacl]     Nausea/vomtiing    Outpatient Encounter Medications as of 10/01/2021  Medication Sig   ACCU-CHEK GUIDE test strip TEST BLOOD SUGAR TWICE DAILY AND AS NEEDED FOR DIABETES   Accu-Chek Softclix Lancets lancets TEST BLOOD SUGAR TWICE DAILY AND AS NEEDED FOR  DIABETES   acetaminophen (TYLENOL) 500 MG tablet Take 1,000 mg by mouth every 6 (six) hours as needed for mild pain or headache.    Alcohol Swabs (DROPSAFE ALCOHOL PREP) 70 % PADS USE  TO CHECK BLOOD SUGAR TWICE DAILY  AND AS NEEDED   aspirin (ASPIRIN LOW DOSE) 81 MG EC tablet Take 1 tablet (81 mg total) by mouth daily. Swallow whole.   atorvastatin (LIPITOR) 20 MG tablet Take 1 tablet (20 mg total) by mouth daily.   Blood Glucose Calibration (TRUE METRIX LEVEL 1) Low SOLN Use as needed with BS machine   Blood Glucose Monitoring Suppl (ACCU-CHEK GUIDE) w/Device KIT USE AS DIRECTED FOR DIABETES   Carboxymethylcellul-Glycerin (REFRESH RELIEVA OP) Apply to eye.   cholecalciferol (VITAMIN D) 25 MCG (1000 UNIT) tablet Take 1 tablet (1,000 Units total) by mouth daily.   diclofenac Sodium (VOLTAREN) 1 % GEL Apply 2 g topically 4 (four) times daily as needed.   hydrALAZINE (APRESOLINE) 10 MG tablet Take 1 tablet (10 mg total) by mouth 2 (two) times daily.   JANUMET XR 50-500 MG TB24 Take 1 tablet by mouth 2 (two) times daily.   Lancet Devices (TRUEDRAW LANCING DEVICE) MISC Teset BS BID Dx E11.9   losartan-hydrochlorothiazide (HYZAAR) 50-12.5 MG tablet Take 1 tablet by mouth daily.   metoprolol succinate (TOPROL-XL) 50 MG 24 hr tablet Take 1 tablet (50 mg total) by mouth daily. Take with or immediately following a meal.   nitroGLYCERIN (NITROSTAT) 0.4 MG SL tablet DISSOLVE 1 TABLET UNDER THE TONGUE EVERY 5 MINUTES AS NEEDED FOR CHEST PAIN   omeprazole (PRILOSEC) 40 MG capsule Take 1 capsule (40  mg total) by mouth daily.   polyethylene glycol (MIRALAX / GLYCOLAX) 17 g packet Take 17 g by mouth daily as needed.   Prodigy Twist Top Lancets 28G MISC TEST BLOOD SUGAR TWICE DAILY Dx E11.9   TRUEplus Lancets 30G MISC Teset BS BID Dx E11.9   No facility-administered encounter medications on file as of 10/01/2021.    Patient Active Problem List   Diagnosis Date Noted   CKD stage 3 due to type 2 diabetes  mellitus ([place]) 04/07/2020   LBBB (left bundle branch block) 07/20/2019   GERD (gastroesophageal reflux disease) 10/20/2013   Coronary atherosclerosis of native coronary artery 01/14/2013   CARDIAC MURMUR 07/03/2009   Type 2 diabetes mellitus ([place]) 09/12/2008   Hyperlipidemia associated with type 2 diabetes mellitus ([place]) 09/12/2008   Hypertension associated with diabetes ([place]) 09/12/2008    Conditions to be addressed/monitored: monitor client management of depression issues. Monitor client management of in home care needs of client  Care Plan : LCSW Care Plan  Updates made by Katha Cabal, LCSW since 10/01/2021 12:00 AM     Problem: Anxiety Identification (Anxiety)      Goal: manage anxiety and stress symptoms. Manage depression symptoms   Start Date: 06/20/2021  Expected End Date: 12/12/2021  This Visit's Progress: On track  Recent Progress: On track  Priority: Medium  Note:   Current Barriers:  Chronic Mental Health needs related to management of anxiety and stress issues Mobility issues Pain Issues Depression issues.  Suicidal Ideation/Homicidal Ideation: No In home care needs  Clinical Social Work Goal(s):  patient will work with SW monthly by telephone or in person to reduce or manage symptoms related to anxiety and stress issues faced patient will communicate with RNCM or LCSW as needed in next 30 days for CCM support Patient will communicate with local DSS Medicaid caseworker as needed to determine client eligibility for Medicaid Patient will talk with RNCM as needed to discuss nursing needs of client  Interventions: 1:1 collaboration with Tonya Carpenter, Tonya Kaufmann, MD regarding development and update of comprehensive plan of care as evidenced by provider attestation and co-signature Discussed pain issues with client. Tonya Carpenter said she has occasional shoulder pain issues Discussed sleeping issues of client.  Client said sometimes she has reduced sleep Reviewed  medication procurement for client Encouraged client to call RNCM as needed for CCM nursing support. Discussed Food Stamps benefit with client. Client said she does get a Food Stamps benefit each month. She said Food Stamps benefit is for a small amount. LCSW asked Kassadi if she had gone to [employer] for food support. She said she had not gone to that food pantry agency Discussed family support. Yumna said she has reduced family support Provided counseling support for client Discussed housing location. Shifa said that her apartment is close to PCP office, close to pharmacy, close to [employer], close to [employer]. She said she often walks to complete errands or go to PCP appointment Client spoke of in home care needs. Client has spoken to DSS Medicaid caseworker about Medicaid eligibility of client. Client was told by DSS caseworker that she earns too much to qualify for Medicaid benefit.  Patient Self Care Activities:  Self administers medications as prescribed Attends all scheduled provider appointments Performs ADL's independently  Patient Coping Strengths:  Family Friends  Patient Self Care Deficits:  Some pain issues Some mobility issues  Patient Goals:  - spend time or talk with others every day - practice  relaxation or meditation daily - keep a calendar with appointment dates  Follow Up Plan: LCSW to call client on 11/14/21 at 10:00 AM     [name].Fidelia Cathers MSW, [place] representative [facility] Care Management [phone]

## 2021-10-02 ENCOUNTER — Telehealth: Payer: Self-pay | Admitting: Family Medicine

## 2021-10-02 DIAGNOSIS — Z550 Illiteracy and low-level literacy: Secondary | ICD-10-CM

## 2021-10-02 DIAGNOSIS — E639 Nutritional deficiency, unspecified: Secondary | ICD-10-CM

## 2021-10-03 ENCOUNTER — Other Ambulatory Visit: Payer: Self-pay | Admitting: *Deleted

## 2021-10-03 DIAGNOSIS — K2100 Gastro-esophageal reflux disease with esophagitis, without bleeding: Secondary | ICD-10-CM

## 2021-10-03 MED ORDER — OMEPRAZOLE 40 MG PO CPDR: 40.0000 mg | DELAYED_RELEASE_CAPSULE | Freq: Every day | ORAL | 3 refills | Status: DC

## 2021-10-04 MED ORDER — ENSURE ENLIVE PO LIQD: 237.0000 mL | ORAL | 3 refills | Status: DC

## 2021-10-04 NOTE — Telephone Encounter (Signed)
No answer and no voice mail.

## 2021-10-04 NOTE — Telephone Encounter (Signed)
I sent prescription to [facility] for Ensure, she will have to call them and asked them how much it would cost her and how much would cost for delivery.

## 2021-10-15 DIAGNOSIS — E1159 Type 2 diabetes mellitus with other circulatory complications: Secondary | ICD-10-CM

## 2021-10-15 DIAGNOSIS — I2510 Atherosclerotic heart disease of native coronary artery without angina pectoris: Secondary | ICD-10-CM

## 2021-10-15 DIAGNOSIS — I152 Hypertension secondary to endocrine disorders: Secondary | ICD-10-CM | POA: Diagnosis not present

## 2021-10-15 DIAGNOSIS — E1122 Type 2 diabetes mellitus with diabetic chronic kidney disease: Secondary | ICD-10-CM

## 2021-10-15 DIAGNOSIS — N183 Chronic kidney disease, stage 3 unspecified: Secondary | ICD-10-CM

## 2021-10-15 DIAGNOSIS — E1169 Type 2 diabetes mellitus with other specified complication: Secondary | ICD-10-CM | POA: Diagnosis not present

## 2021-10-15 DIAGNOSIS — E785 Hyperlipidemia, unspecified: Secondary | ICD-10-CM | POA: Diagnosis not present

## 2021-10-22 ENCOUNTER — Other Ambulatory Visit: Payer: Self-pay | Admitting: Family Medicine

## 2021-10-26 ENCOUNTER — Encounter: Payer: Self-pay | Admitting: Family Medicine

## 2021-10-26 ENCOUNTER — Ambulatory Visit (INDEPENDENT_AMBULATORY_CARE_PROVIDER_SITE_OTHER): Payer: Medicare HMO | Admitting: Family Medicine

## 2021-10-26 VITALS — BP 142/64 | HR 54 | Temp 98.2°F | Ht 63.0 in | Wt 148.0 lb

## 2021-10-26 DIAGNOSIS — E1169 Type 2 diabetes mellitus with other specified complication: Secondary | ICD-10-CM | POA: Diagnosis not present

## 2021-10-26 DIAGNOSIS — I152 Hypertension secondary to endocrine disorders: Secondary | ICD-10-CM | POA: Diagnosis not present

## 2021-10-26 DIAGNOSIS — E785 Hyperlipidemia, unspecified: Secondary | ICD-10-CM

## 2021-10-26 DIAGNOSIS — K2100 Gastro-esophageal reflux disease with esophagitis, without bleeding: Secondary | ICD-10-CM | POA: Diagnosis not present

## 2021-10-26 DIAGNOSIS — E1122 Type 2 diabetes mellitus with diabetic chronic kidney disease: Secondary | ICD-10-CM

## 2021-10-26 DIAGNOSIS — E1159 Type 2 diabetes mellitus with other circulatory complications: Secondary | ICD-10-CM

## 2021-10-26 DIAGNOSIS — N183 Chronic kidney disease, stage 3 unspecified: Secondary | ICD-10-CM

## 2021-10-26 LAB — BAYER DCA HB A1C WAIVED: HB A1C (BAYER DCA - WAIVED): 6.2 % — ABNORMAL HIGH (ref 4.8–5.6)

## 2021-10-26 NOTE — Progress Notes (Signed)
BP (!) 142/64   Pulse (!) 54   Temp 98.2 F (36.8 C)   Ht 5' 3"  (1.6 m)   Wt 148 lb (67.1 kg)   SpO2 99%   BMI 26.22 kg/m    Subjective:   Patient ID: Tonya Carpenter, female    DOB: [birth year], 76 y.o.   MRN: [id number]  HPI: Tonya Carpenter is a 76 y.o. female presenting on 10/26/2021 for Medical Management of Chronic Issues, Hyperlipidemia, Diabetes, and Hypertension   HPI Type 2 diabetes mellitus Patient comes in today for recheck of his diabetes. Patient has been currently taking Janumet. Patient is currently on an ACE inhibitor/ARB. Patient has not seen an ophthalmologist this year. Patient denies any issues with their feet. The symptom started onset as an adult hypertension and hyperlipidemia and CKD ARE RELATED TO DM   Hyperlipidemia Patient is coming in for recheck of his hyperlipidemia. The patient is currently taking atorvastatin. They deny any issues with myalgias or history of liver damage from it. They deny any focal numbness or weakness or chest pain.   Hypertension Patient is currently on losartan- hctz and hydralazine as needed, and their blood pressure today is 142 over. Patient denies any lightheadedness or dizziness. Patient denies headaches, blurred vision, chest pains, shortness of breath, or weakness. Denies any side effects from medication and is content with current medication.   GERD Patient is currently on omeprazole.  She denies any major symptoms or abdominal pain or belching or burping. She denies any blood in her stool or lightheadedness or dizziness.   Relevant past medical, surgical, family and social history reviewed and updated as indicated. Interim medical history since our last visit reviewed. Allergies and medications reviewed and updated.  Review of Systems  Constitutional:  Negative for chills and fever.  Eyes:  Negative for visual disturbance.  Respiratory:  Negative for chest tightness and shortness of breath.   Cardiovascular:  Negative for  chest pain and leg swelling.  Musculoskeletal:  Negative for back pain and gait problem.  Skin:  Negative for rash.  Neurological:  Negative for dizziness, light-headedness and headaches.  Psychiatric/Behavioral:  Negative for agitation and behavioral problems.   All other systems reviewed and are negative.   Per HPI unless specifically indicated above   Allergies as of 10/26/2021       Reactions   Trilyte [peg 3350-kcl-na Bicarb-nacl]    Nausea/vomtiing        Medication List        Accurate as of October 26, 2021 10:54 AM. If you have any questions, ask your nurse or doctor.          Accu-Chek Guide test strip Generic drug: glucose blood TEST BLOOD SUGAR TWICE DAILY AND AS NEEDED FOR DIABETES   Accu-Chek Guide w/Device Kit USE AS DIRECTED FOR DIABETES   acetaminophen 500 MG tablet Commonly known as: TYLENOL Take 1,000 mg by mouth every 6 (six) hours as needed for mild pain or headache.   aspirin EC 81 MG tablet Commonly known as: Aspirin Low Dose Take 1 tablet (81 mg total) by mouth daily. Swallow whole.   atorvastatin 20 MG tablet Commonly known as: LIPITOR Take 1 tablet (20 mg total) by mouth daily.   cholecalciferol 25 MCG (1000 UNIT) tablet Commonly known as: VITAMIN D3 TAKE 1 TABLET EVERY DAY   diclofenac Sodium 1 % Gel Commonly known as: VOLTAREN Apply 2 g topically 4 (four) times daily as needed.   DropSafe Alcohol Prep 70 %  Pads USE  TO CHECK BLOOD SUGAR TWICE DAILY  AND AS NEEDED   feeding supplement Liqd Take 237 mLs by mouth daily.   hydrALAZINE 10 MG tablet Commonly known as: APRESOLINE Take 1 tablet (10 mg total) by mouth 2 (two) times daily.   Janumet XR 50-500 MG Tb24 Generic drug: SitaGLIPtin-MetFORMIN HCl Take 1 tablet by mouth 2 (two) times daily.   losartan-hydrochlorothiazide 50-12.5 MG tablet Commonly known as: HYZAAR Take 1 tablet by mouth daily.   metoprolol succinate 50 MG 24 hr tablet Commonly known as:  TOPROL-XL Take 1 tablet (50 mg total) by mouth daily. Take with or immediately following a meal.   nitroGLYCERIN 0.4 MG SL tablet Commonly known as: NITROSTAT DISSOLVE 1 TABLET UNDER THE TONGUE EVERY 5 MINUTES AS NEEDED FOR CHEST PAIN   omeprazole 40 MG capsule Commonly known as: PRILOSEC Take 1 capsule (40 mg total) by mouth daily.   polyethylene glycol 17 g packet Commonly known as: MIRALAX / GLYCOLAX Take 17 g by mouth daily as needed.   REFRESH RELIEVA OP Apply to eye.   True Metrix Level 1 Low Soln Use as needed with BS machine   TRUEdraw Lancing Device Misc Teset BS BID Dx E11.9   TRUEplus Lancets 30G Misc Teset BS BID Dx E11.9   Accu-Chek Softclix Lancets lancets TEST BLOOD SUGAR TWICE DAILY AND AS NEEDED FOR DIABETES   Prodigy Twist Top Lancets 28G Misc TEST BLOOD SUGAR TWICE DAILY Dx E11.9         Objective:   BP (!) 142/64   Pulse (!) 54   Temp 98.2 F (36.8 C)   Ht 5' 3"  (1.6 m)   Wt 148 lb (67.1 kg)   SpO2 99%   BMI 26.22 kg/m   Wt Readings from Last 3 Encounters:  10/26/21 148 lb (67.1 kg)  07/26/21 145 lb (65.8 kg)  07/11/21 145 lb (65.8 kg)    Physical Exam Vitals and nursing note reviewed.  Constitutional:      General: She is not in acute distress.    Appearance: She is well-developed. She is not diaphoretic.  Eyes:     Conjunctiva/sclera: Conjunctivae normal.  Cardiovascular:     Rate and Rhythm: Normal rate and regular rhythm.     Heart sounds: Normal heart sounds. No murmur heard. Pulmonary:     Effort: Pulmonary effort is normal. No respiratory distress.     Breath sounds: Normal breath sounds. No wheezing.  Musculoskeletal:        General: No tenderness. Normal range of motion.  Skin:    General: Skin is warm and dry.     Findings: No rash.  Neurological:     Mental Status: She is alert and oriented to person, place, and time.     Coordination: Coordination normal.  Psychiatric:        Behavior: Behavior normal.        Assessment & Plan:   Problem List Items Addressed This Visit       Cardiovascular and Mediastinum   Hypertension associated with diabetes ([place])     Digestive   GERD (gastroesophageal reflux disease)     Endocrine   Type 2 diabetes mellitus ([place]) - Primary   Relevant Orders   Bayer DCA Hb A1c Waived   Hyperlipidemia associated with type 2 diabetes mellitus ([place])   CKD stage 3 due to type 2 diabetes mellitus ([facility])    A1c looks good, continue current medicine, blood pressure slightly elevated.  Allowing permissive  hypertension for age. Follow up plan: Return in about 3 months (around 01/26/2022), or if symptoms worsen or fail to improve, for dm and htn.  Counseling provided for all of the vaccine components Orders Placed This Encounter  Procedures   Bayer [place] Hb A1c [place] Megha Agnes, MD [facility] 10/26/2021, 10:54 AM

## 2021-10-27 ENCOUNTER — Other Ambulatory Visit: Payer: Self-pay | Admitting: Family Medicine

## 2021-10-29 ENCOUNTER — Encounter: Payer: Self-pay | Admitting: Family Medicine

## 2021-10-29 ENCOUNTER — Ambulatory Visit (INDEPENDENT_AMBULATORY_CARE_PROVIDER_SITE_OTHER): Payer: Medicare HMO | Admitting: Family Medicine

## 2021-10-29 VITALS — BP 129/64 | HR 69 | Temp 98.0°F | Ht 63.0 in | Wt 148.0 lb

## 2021-10-29 DIAGNOSIS — H9313 Tinnitus, bilateral: Secondary | ICD-10-CM

## 2021-10-29 NOTE — Progress Notes (Signed)
BP 129/64   Pulse 69   Temp 98 F (36.7 C)   Ht 5' 3"  (1.6 m)   Wt 148 lb (67.1 kg)   SpO2 97%   BMI 26.22 kg/m    Subjective:   Patient ID: Tonya Carpenter, female    DOB: [birth year], 76 y.o.   MRN: [id number]  HPI: Tonya Carpenter is a 76 y.o. female presenting on 10/29/2021 for Tinnitus (Present for years on and off)   HPI Patient comes in today complaining of ringing or ears that comes on and off of the past couple years but is worse recently and that is what she is coming in to discuss.  She denies any pain or drainage.  She denies any headaches or fevers or chills.  Relevant past medical, surgical, family and social history reviewed and updated as indicated. Interim medical history since our last visit reviewed. Allergies and medications reviewed and updated.  Review of Systems  Constitutional:  Negative for chills and fever.  HENT:  Positive for hearing loss and tinnitus. Negative for congestion, ear discharge, ear pain, sinus pressure, sinus pain, sneezing, sore throat, trouble swallowing and voice change.   Eyes:  Negative for visual disturbance.  Respiratory:  Negative for chest tightness and shortness of breath.   Cardiovascular:  Negative for chest pain and leg swelling.  Genitourinary:  Negative for difficulty urinating and dysuria.  Musculoskeletal:  Negative for back pain and gait problem.  Skin:  Negative for rash.  Neurological:  Negative for light-headedness and headaches.  Psychiatric/Behavioral:  Negative for agitation and behavioral problems.   All other systems reviewed and are negative.   Per HPI unless specifically indicated above   Allergies as of 10/29/2021       Reactions   Trilyte [peg 3350-kcl-na Bicarb-nacl]    Nausea/vomtiing        Medication List        Accurate as of October 29, 2021  9:57 AM. If you have any questions, ask your nurse or doctor.          Accu-Chek Guide test strip Generic drug: glucose blood TEST BLOOD SUGAR TWICE  DAILY AND AS NEEDED FOR DIABETES   Accu-Chek Guide w/Device Kit USE AS DIRECTED FOR DIABETES   acetaminophen 500 MG tablet Commonly known as: TYLENOL Take 1,000 mg by mouth every 6 (six) hours as needed for mild pain or headache.   aspirin EC 81 MG tablet Commonly known as: Aspirin Low Dose Take 1 tablet (81 mg total) by mouth daily. Swallow whole.   atorvastatin 20 MG tablet Commonly known as: LIPITOR Take 1 tablet (20 mg total) by mouth daily.   cholecalciferol 25 MCG (1000 UNIT) tablet Commonly known as: VITAMIN D3 TAKE 1 TABLET EVERY DAY   diclofenac Sodium 1 % Gel Commonly known as: VOLTAREN Apply 2 g topically 4 (four) times daily as needed.   DropSafe Alcohol Prep 70 % Pads TEST BLOOD SUGAR TWICE DAILY AND AS NEEDED   feeding supplement Liqd Take 237 mLs by mouth daily.   hydrALAZINE 10 MG tablet Commonly known as: APRESOLINE Take 1 tablet (10 mg total) by mouth 2 (two) times daily.   Janumet XR 50-500 MG Tb24 Generic drug: SitaGLIPtin-MetFORMIN HCl Take 1 tablet by mouth 2 (two) times daily.   losartan-hydrochlorothiazide 50-12.5 MG tablet Commonly known as: HYZAAR Take 1 tablet by mouth daily.   metoprolol succinate 50 MG 24 hr tablet Commonly known as: TOPROL-XL Take 1 tablet (50 mg total) by  mouth daily. Take with or immediately following a meal.   nitroGLYCERIN 0.4 MG SL tablet Commonly known as: NITROSTAT DISSOLVE 1 TABLET UNDER THE TONGUE EVERY 5 MINUTES AS NEEDED FOR CHEST PAIN   omeprazole 40 MG capsule Commonly known as: PRILOSEC Take 1 capsule (40 mg total) by mouth daily.   polyethylene glycol 17 g packet Commonly known as: MIRALAX / GLYCOLAX Take 17 g by mouth daily as needed.   REFRESH RELIEVA OP Apply to eye.   True Metrix Level 1 Low Soln Use as needed with BS machine   TRUEdraw Lancing Device Misc Teset BS BID Dx E11.9   TRUEplus Lancets 30G Misc Teset BS BID Dx E11.9   Accu-Chek Softclix Lancets lancets TEST BLOOD SUGAR  TWICE DAILY AND AS NEEDED FOR DIABETES   Prodigy Twist Top Lancets 28G Misc TEST BLOOD SUGAR TWICE DAILY Dx E11.9         Objective:   BP 129/64   Pulse 69   Temp 98 F (36.7 C)   Ht 5' 3"  (1.6 m)   Wt 148 lb (67.1 kg)   SpO2 97%   BMI 26.22 kg/m   Wt Readings from Last 3 Encounters:  10/29/21 148 lb (67.1 kg)  10/26/21 148 lb (67.1 kg)  07/26/21 145 lb (65.8 kg)    Physical Exam Vitals and nursing note reviewed.  Constitutional:      General: She is not in acute distress.    Appearance: She is well-developed. She is not diaphoretic.  HENT:     Right Ear: Tympanic membrane, ear canal and external ear normal.     Left Ear: Tympanic membrane, ear canal and external ear normal.     Mouth/Throat:     Mouth: Mucous membranes are moist.     Pharynx: Oropharynx is clear. No oropharyngeal exudate or posterior oropharyngeal erythema.  Eyes:     Conjunctiva/sclera: Conjunctivae normal.  Skin:    General: Skin is warm and dry.     Findings: No rash.  Neurological:     Mental Status: She is alert and oriented to person, place, and time.     Coordination: Coordination normal.  Psychiatric:        Behavior: Behavior normal.       Assessment & Plan:   Problem List Items Addressed This Visit   None Visit Diagnoses     Tinnitus of both ears    -  Primary     Discussed hearing changes and that that is likely source for it and if she wants to go get hearing aids that would be a possibility of something that could help a little bit versus using white noise makers.  At this point she does not want to see about hearing aids so we will hold off for the future  Follow up plan: Return if symptoms worsen or fail to improve.  Counseling provided for all of the vaccine components No orders of the defined types were placed in this encounter.   Caryl Pina, MD [facility] 10/29/2021, 9:57 AM

## 2021-10-30 ENCOUNTER — Telehealth: Payer: Self-pay | Admitting: Family Medicine

## 2021-10-30 DIAGNOSIS — E639 Nutritional deficiency, unspecified: Secondary | ICD-10-CM

## 2021-10-30 DIAGNOSIS — Z5941 Food insecurity: Secondary | ICD-10-CM

## 2021-10-30 DIAGNOSIS — Z550 Illiteracy and low-level literacy: Secondary | ICD-10-CM

## 2021-10-30 DIAGNOSIS — E638 Other specified nutritional deficiencies: Secondary | ICD-10-CM

## 2021-10-30 NOTE — Telephone Encounter (Signed)
  Prescription Request  10/30/2021  Is this a "Controlled Substance" medicine? no  Have you seen your PCP in the last 2 weeks? yes  If YES, route message to pool  -  If NO, patient needs to be scheduled for appointment.  What is the name of the medication or equipment? Pt wants case of insure  Have you contacted your pharmacy to request a refill? no   Which pharmacy would you like this sent to? [facility]   Patient notified that their request is being sent to the clinical staff for review and that they should receive a response within 2 business days.

## 2021-10-30 NOTE — Telephone Encounter (Signed)
Please advise, when looking up to pend to encounter possibly will need a PA

## 2021-10-31 ENCOUNTER — Telehealth: Payer: Self-pay | Admitting: Family Medicine

## 2021-10-31 MED ORDER — ENSURE ENLIVE PO LIQD: 237.0000 mL | ORAL | 3 refills | Status: DC

## 2021-10-31 NOTE — Addendum Note (Signed)
Addended by: Arville Care on: 10/31/2021 07:51 AM   Modules accepted: Orders

## 2021-10-31 NOTE — Telephone Encounter (Signed)
Sent for Ensure to [facility]

## 2021-10-31 NOTE — Telephone Encounter (Signed)
Left vm

## 2021-11-14 ENCOUNTER — Ambulatory Visit (INDEPENDENT_AMBULATORY_CARE_PROVIDER_SITE_OTHER): Payer: Medicare HMO

## 2021-11-14 DIAGNOSIS — M7541 Impingement syndrome of right shoulder: Secondary | ICD-10-CM

## 2021-11-14 DIAGNOSIS — E785 Hyperlipidemia, unspecified: Secondary | ICD-10-CM

## 2021-11-14 DIAGNOSIS — E1122 Type 2 diabetes mellitus with diabetic chronic kidney disease: Secondary | ICD-10-CM

## 2021-11-14 DIAGNOSIS — I2510 Atherosclerotic heart disease of native coronary artery without angina pectoris: Secondary | ICD-10-CM

## 2021-11-14 DIAGNOSIS — K2100 Gastro-esophageal reflux disease with esophagitis, without bleeding: Secondary | ICD-10-CM

## 2021-11-14 DIAGNOSIS — I152 Hypertension secondary to endocrine disorders: Secondary | ICD-10-CM

## 2021-11-14 NOTE — Patient Instructions (Addendum)
Visit Information  Patient Goals  Protect My Health (Patient).  Manage anxiety and stress issues. Manage depression issues  Timeframe:  Short-Term Goal Priority:  Medium Progress: On Track Start Date:    06/20/21                          Expected End Date:                  12/12/21      Follow Up Date    LCSW is discharging client today from CCM SW services      Protect My Health (Patient) Manage Anxiety and Stress issues faced . Manage depression symptoms   Why is this important?   Screening tests can find diseases early when they are easier to treat.  Your doctor or nurse will talk with you about which tests are important for you.  Getting shots for common diseases like the flu and shingles will help prevent them.      Patient Self Care Activities:  Self administers medications as prescribed Attends all scheduled provider appointments Performs ADL's independently  Patient Coping Strengths:  Family Friends  Patient Self Care Deficits:  Some pain issues Some mobility issues  Patient Goals:  - spend time or talk with others every day - practice relaxation or meditation daily - keep a calendar with appointment dates  Follow Up Plan: LCSW is discharging client today from CCM SW services. Client agreed to plan. Client aware of Care Coordination program support  [name].Jaydah Stahle MSW, LCSW Licensed Visual merchandiser [facility] Care Management [phone]

## 2021-11-14 NOTE — Chronic Care Management (AMB) (Signed)
Chronic Care Management    Clinical Social Work Note  11/14/2021 Name: Tonya Carpenter MRN: [id number] DOB: [birth year]  Tonya Carpenter is a 76 y.o. year old female who is a primary care patient of [name], Fransisca Kaufmann, MD. The CCM team was consulted to assist the patient with chronic disease management and/or care coordination needs related to: [name] .   Engaged with patient by telephone for follow up visit in response to provider referral for social work chronic care management and care coordination services.   Consent to Services:  The patient was given information about Chronic Care Management services, agreed to services, and gave verbal consent prior to initiation of services.  Please see initial visit note for detailed documentation.   Patient agreed to services and consent obtained.   Assessment: Review of patient past medical history, allergies, medications, and health status, including review of relevant consultants reports was performed today as part of a comprehensive evaluation and provision of chronic care management and care coordination services.     SDOH (Social Determinants of Health) assessments and interventions performed:  SDOH Interventions    Flowsheet Row Most Recent Value  SDOH Interventions   Stress Interventions Provide Counseling  [client has stress related to managing medical needs]  Depression Interventions/Treatment  Counseling        Advanced Directives Status: See Vynca application for related entries.  CCM Care Plan  Allergies  Allergen Reactions   Trilyte [Peg 3350-Kcl-Na Bicarb-Nacl]     Nausea/vomtiing    Outpatient Encounter Medications as of 11/14/2021  Medication Sig   ACCU-CHEK GUIDE test strip TEST BLOOD SUGAR TWICE DAILY AND AS NEEDED FOR DIABETES   Accu-Chek Softclix Lancets lancets TEST BLOOD SUGAR TWICE DAILY AND AS NEEDED FOR DIABETES   acetaminophen (TYLENOL) 500 MG tablet Take 1,000 mg by mouth every 6 (six) hours as  needed for mild pain or headache.    Alcohol Swabs (DROPSAFE ALCOHOL PREP) 70 % PADS TEST BLOOD SUGAR TWICE DAILY AND AS NEEDED   aspirin (ASPIRIN LOW DOSE) 81 MG EC tablet Take 1 tablet (81 mg total) by mouth daily. Swallow whole.   atorvastatin (LIPITOR) 20 MG tablet Take 1 tablet (20 mg total) by mouth daily.   Blood Glucose Calibration (TRUE METRIX LEVEL 1) Low SOLN Use as needed with BS machine   Blood Glucose Monitoring Suppl (ACCU-CHEK GUIDE) w/Device KIT USE AS DIRECTED FOR DIABETES   Carboxymethylcellul-Glycerin (REFRESH RELIEVA OP) Apply to eye.   cholecalciferol (VITAMIN D3) 25 MCG (1000 UNIT) tablet TAKE 1 TABLET EVERY DAY   diclofenac Sodium (VOLTAREN) 1 % GEL Apply 2 g topically 4 (four) times daily as needed.   feeding supplement (ENSURE ENLIVE / ENSURE PLUS) LIQD Take 237 mLs by mouth daily.   hydrALAZINE (APRESOLINE) 10 MG tablet Take 1 tablet (10 mg total) by mouth 2 (two) times daily.   JANUMET XR 50-500 MG TB24 Take 1 tablet by mouth 2 (two) times daily.   Lancet Devices (TRUEDRAW LANCING DEVICE) MISC Teset BS BID Dx E11.9   losartan-hydrochlorothiazide (HYZAAR) 50-12.5 MG tablet Take 1 tablet by mouth daily.   metoprolol succinate (TOPROL-XL) 50 MG 24 hr tablet Take 1 tablet (50 mg total) by mouth daily. Take with or immediately following a meal.   nitroGLYCERIN (NITROSTAT) 0.4 MG SL tablet DISSOLVE 1 TABLET UNDER THE TONGUE EVERY 5 MINUTES AS NEEDED FOR CHEST PAIN   omeprazole (PRILOSEC) 40 MG capsule Take 1 capsule (40 mg total) by mouth daily.   polyethylene  glycol (MIRALAX / GLYCOLAX) 17 g packet Take 17 g by mouth daily as needed.   Prodigy Twist Top Lancets 28G MISC TEST BLOOD SUGAR TWICE DAILY Dx E11.9   TRUEplus Lancets 30G MISC Teset BS BID Dx E11.9   No facility-administered encounter medications on file as of 11/14/2021.    Patient Active Problem List   Diagnosis Date Noted   CKD stage 3 due to type 2 diabetes mellitus ([place]) 04/07/2020   LBBB (left bundle  branch block) 07/20/2019   GERD (gastroesophageal reflux disease) 10/20/2013   Coronary atherosclerosis of native coronary artery 01/14/2013   CARDIAC MURMUR 07/03/2009   Type 2 diabetes mellitus ([place]) 09/12/2008   Hyperlipidemia associated with type 2 diabetes mellitus ([place]) 09/12/2008   Hypertension associated with diabetes ([place]) 09/12/2008    Conditions to be addressed/monitored: monitor depression issues of client  Care Plan : [place]  Updates made by Katha Cabal, LCSW since 11/14/2021 12:00 AM     Problem: Anxiety Identification (Anxiety)      Goal: manage anxiety and stress symptoms. Manage depression symptoms   Start Date: 06/20/2021  Expected End Date: 12/12/2021  This Visit's Progress: On track  Recent Progress: On track  Priority: Medium  Note:   Current Barriers:  Chronic Mental Health needs related to management of anxiety and stress issues Mobility issues Pain Issues Depression issues.  Suicidal Ideation/Homicidal Ideation: No In home care needs  Clinical Social Work Goal(s):  patient will work with SW monthly by telephone or in person to reduce or manage symptoms related to anxiety and stress issues faced patient will communicate with RNCM or LCSW as needed in next 30 days for Care Coordination support Patient will communicate with local DSS Medicaid caseworker as needed to determine client eligibility for Medicaid  Interventions: 1:1 collaboration with [name], Fransisca Kaufmann, MD regarding development and update of comprehensive plan of care as evidenced by provider attestation and co-signature Discussed sleeping issues of client.  Client said sometimes she has reduced sleep Reviewed medication procurement for client Discussed ambulation needs of client. She said she walks daily.  Provided counseling support for client Client spoke of in home care needs. Client has spoken to DSS Medicaid caseworker about Medicaid eligibility of client. Client was told  by DSS caseworker that she earns too much to qualify for Medicaid benefit. Informed client of support through [facility] with Triage Nurse Informed client about Care Coordination program support. Described Care Coordination program support with RN Joellyn Quails and [place]  Client asked for RN Joellyn Quails to call her in November of 2023. LCSW to schedule RN Joellyn Quails phone visit with client in November of 2023 per client request. Client has appointment with PCP at [facility] on 01/31/22 Client plans to talk more with PCP about Care Coordination program support Client and LCSW agreed for LCSW to discharge client today from [place]. Client is aware of Care Coordination program support.   Patient Self Care Activities:  Self administers medications as prescribed Attends all scheduled provider appointments Performs ADL's independently  Patient Coping Strengths:  Family Friends  Patient Self Care Deficits:  Some pain issues Some mobility issues  Patient Goals:  - spend time or talk with others every day - practice relaxation or meditation daily - keep a calendar with appointment dates  Follow Up Plan: LCSW  is discharging client today from [place]. Client agreed to this plan Client aware of Care Coordination program support  [name].Natane Heward MSW, [place] Holiday representative [facility] Care Management [phone]

## 2021-11-15 DIAGNOSIS — I152 Hypertension secondary to endocrine disorders: Secondary | ICD-10-CM

## 2021-11-15 DIAGNOSIS — E1122 Type 2 diabetes mellitus with diabetic chronic kidney disease: Secondary | ICD-10-CM

## 2021-11-15 DIAGNOSIS — E785 Hyperlipidemia, unspecified: Secondary | ICD-10-CM

## 2021-11-15 DIAGNOSIS — I2510 Atherosclerotic heart disease of native coronary artery without angina pectoris: Secondary | ICD-10-CM

## 2021-11-15 DIAGNOSIS — N183 Chronic kidney disease, stage 3 unspecified: Secondary | ICD-10-CM

## 2021-11-15 DIAGNOSIS — E1169 Type 2 diabetes mellitus with other specified complication: Secondary | ICD-10-CM

## 2021-11-15 DIAGNOSIS — E1159 Type 2 diabetes mellitus with other circulatory complications: Secondary | ICD-10-CM

## 2021-11-29 ENCOUNTER — Encounter: Payer: Self-pay | Admitting: *Deleted

## 2021-11-29 ENCOUNTER — Ambulatory Visit: Payer: Self-pay | Admitting: *Deleted

## 2021-11-29 NOTE — Patient Instructions (Signed)
Visit Information  Thank you for taking time to visit with me today. Please don't hesitate to contact me if I can be of assistance to you.   Following are the goals we discussed today:   Goals Addressed               This Visit's Progress     Receive Assistance Arranging In-Home Care Services. (pt-stated)   On track     Care Coordination Interventions:  Discussed plans with patient for ongoing care management follow up. Provided patient with direct contact information for care management team. Screening for signs and symptoms of depression related to chronic disease state.  Assessed social determinant of health barriers. Active listening/reflection utilized.  Problem solving/task-centered strategies developed. Quality of sleep assessed and sleep hygiene techniques promoted.  Verbalization of feelings encouraged.  Discussed caregiver resources and support. Mailed the following list of agencies and resources, on 11/29/2021:  Home Health Care Agencies In-Home Care & Respite Agencies   Personal Care Services Application Personal Care Services Instructions Personal Care Services Providers 2023 Medicaid Tips Medicaid Application  Encouraged to review the list of agencies and resources provided, and be prepared to discuss during our next scheduled telephone outreach call.         Our next appointment is by telephone on 12/07/2021 at 10:00 am.  Please call the care guide team at [phone] if you need to cancel or reschedule your appointment.   If you are experiencing a Mental Health or Behavioral Health Crisis or need someone to talk to, please call the Suicide and Crisis Lifeline: 988 call the [place] National Suicide Prevention Lifeline: 470 750 9934 or TTY: 737-807-0688 TTY 410-808-0935) to talk to a trained counselor call 1-800-273-TALK (toll free, 24 hour hotline) go to [facility] [address], [place] ([phone]) call the  [facility] Crisis Line: 4372842217 call 911  Patient verbalizes understanding of instructions and care plan provided today and agrees to view in MyChart. Active MyChart status and patient understanding of how to access instructions and care plan via MyChart confirmed with patient.     Telephone follow up appointment with care management team member scheduled for:  12/07/2021 at 10:00 am.  Danford Bad, BSW, MSW, LCSW  Licensed Clinical Social Worker  [facility]  Mailing [place]. [address] [place], [place] [address] Physical Address[address] [place], [place] [address] Toll Free Main # [phone] Fax # [phone] Cell # [phone] [email or web address]

## 2021-11-29 NOTE — Patient Outreach (Signed)
  Care Coordination   Initial Visit Note   11/29/2021  Name: Tonya Carpenter MRN: [id number] DOB: 1945/04/20  Tonya Carpenter Condrey is a 76 y.o. year old female who sees [name], Elige Radon, MD for primary care. I spoke with Tonya Carpenter by phone today.  What matters to the patients health and wellness today?   Receive Assistance Arranging In-Home Care Services.   Goals Addressed               This Visit's Progress     Receive Assistance Arranging In-Home Care Services. (pt-stated)   On track     Care Coordination Interventions:  Discussed plans with patient for ongoing care management follow up. Provided patient with direct contact information for care management team. Screening for signs and symptoms of depression related to chronic disease state.  Assessed social determinant of health barriers. Active listening/reflection utilized.  Problem solving/task-centered strategies developed. Quality of sleep assessed and sleep hygiene techniques promoted.  Verbalization of feelings encouraged.  Discussed caregiver resources and support. Mailed the following list of agencies and resources, on 11/29/2021:  Home Health Care Agencies In-Home Care & Respite Agencies   Personal Care Services Application Personal Care Services Instructions Personal Care Services Providers 2023 Medicaid Tips Medicaid Application  Encouraged to review the list of agencies and resources provided, and be prepared to discuss during our next scheduled telephone outreach call.         SDOH assessments and interventions completed:  Yes.  SDOH Interventions Today    Flowsheet Row Most Recent Value  SDOH Interventions   Food Insecurity Interventions Intervention Not Indicated  Housing Interventions Intervention Not Indicated  Transportation Interventions Intervention Not Indicated  Utilities Interventions Intervention Not Indicated  Depression Interventions/Treatment  Referral to Psychiatry, Medication, Counseling,  Patient refuses Treatment  Financial Strain Interventions Intervention Not Indicated  Physical Activity Interventions Intervention Not Indicated  Stress Interventions Provide Counseling, Patient Refused, Warehouse manager Wellness Resources  Social Connections Interventions Intervention Not Indicated        Care Coordination Interventions Activated:  Yes.   Care Coordination Interventions:  Yes, provided.   Follow up plan: Follow up call scheduled for 12/07/2021 at 10:00 am.  Encounter Outcome:  Pt. Visit Completed.   Danford Bad, BSW, MSW, LCSW  Licensed Restaurant manager, fast food Health System  Mailing [place] [address]. [address] [place], [place] [address] Physical Address[address] [place], [place] [address] Toll Free Main # [phone] Fax # [phone] Cell # [phone] [email or web address]

## 2021-12-05 ENCOUNTER — Telehealth: Payer: Self-pay | Admitting: Family Medicine

## 2021-12-05 ENCOUNTER — Other Ambulatory Visit: Payer: Self-pay | Admitting: Family Medicine

## 2021-12-05 DIAGNOSIS — E1122 Type 2 diabetes mellitus with diabetic chronic kidney disease: Secondary | ICD-10-CM

## 2021-12-06 NOTE — Telephone Encounter (Signed)
TC to pt about getting a CNA for help. She has [facility] so I would not be able to do a [place] (personal care service form) which is only for MCD pts. Offered to give her the list of private agencies which are [place], [place]. She said she may get it when she comes in for her next visit.

## 2021-12-07 ENCOUNTER — Ambulatory Visit: Payer: Self-pay | Admitting: *Deleted

## 2021-12-08 ENCOUNTER — Encounter: Payer: Self-pay | Admitting: *Deleted

## 2021-12-08 NOTE — Patient Instructions (Signed)
Visit Information  Thank you for taking time to visit with me today. Please don't hesitate to contact me if I can be of assistance to you.   Following are the goals we discussed today:   Goals Addressed               This Visit's Progress     COMPLETED: [place]. (pt-stated)   On track     Care Coordination Interventions:  Reassessed social determinant of health barriers. Active listening/reflection utilized.  Problem solving/task-centered strategies employed. Verbalization of feelings discussed. Reviewed the following list of agencies and resources:  [place] Application - ineligible [place] Providers - n/a 2023 Medicaid Tips - n/a Medicaid Application -  denied         Please call the care guide team at [phone] if you need to cancel or reschedule your appointment.   If you are experiencing a Mental Health or [place] or need someone to talk to, please call the Suicide and Crisis Lifeline: 988 call the [place] National Suicide Prevention Lifeline: (773)711-4033 or TTY: (518) 594-6368 TTY 7705020398) to talk to a trained counselor call 1-800-273-TALK (toll free, 24 hour hotline) go to [facility] Urgent Care [address] [place] [phone]) call the [place]: [phone] call 911  Patient verbalizes understanding of instructions and care plan provided today and agrees to view in [place]. Active MyChart status and patient understanding of how to access instructions and care plan via MyChart confirmed with patient.     No further follow up required.  Nat Christen, BSW, MSW, LCSW  Licensed Education officer, environmental Health System  Mailing [place] [address].  [address], [place], [place] [address] Physical Address[address] [place], [place] [address] Toll Free Main # [phone] Fax # [phone] Cell # [phone] [email or web address]

## 2021-12-08 NOTE — Patient Outreach (Signed)
  Care Coordination   Follow Up Visit Note   12/08/2021  Name: TERRIL AMARO MRN: [id number] DOB: May 01, 1945  Kristine Royal Eckles is a 76 y.o. year old female who sees [name], Fransisca Kaufmann, MD for primary care. I spoke with Kristine Royal Delgrande by phone today.  What matters to the patients health and wellness today?  Receive Assistance Arranging In-Home Care Services.    Goals Addressed               This Visit's Progress     COMPLETED: [place]. (pt-stated)   On track     Care Coordination Interventions:  Reassessed social determinant of health barriers. Active listening/reflection utilized.  Problem solving/task-centered strategies employed. Verbalization of feelings discussed. Reviewed the following list of agencies and resources:  [place] Application - ineligible Personal Care Services Instructions - [place] Providers - n/a 2023 Medicaid Tips - n/a Medicaid Application -  denied          SDOH assessments and interventions completed:  Yes.  Care Coordination Interventions Activated:  Yes.   Care Coordination Interventions:  Yes, provided.   Follow up plan: No further intervention required.   Encounter Outcome:  Pt. Visit Completed.   Nat Christen, BSW, MSW, LCSW  Licensed Education officer, environmental Health System  Mailing [place] [address]. [address], [place], [place] [address] Physical Address[address] [place], [place] [address] Toll Free Main # [phone] Fax # [phone] Cell # [phone] [email or web address]

## 2021-12-17 ENCOUNTER — Ambulatory Visit: Payer: Self-pay | Admitting: *Deleted

## 2021-12-17 NOTE — Patient Instructions (Addendum)
Visit Information  Thank you for taking time to visit with me today. Please don't hesitate to contact me if I can be of assistance to you.   Following are the goals we discussed today:   Goals Addressed               This Visit's Progress     Patient Stated     Maintain Diabetes [facility]) (pt-stated)   On track     Care Coordination Interventions: Discussed plans with patient for ongoing care management follow up and provided patient with direct contact information for care management team Screening for signs and symptoms of depression related to chronic disease state  Assessed social determinant of health barriers Last HgA1c = 6.2 on 10/26/21        Our next appointment is by telephone on 01/17/22 at 1000  Please call the care guide team at [phone] if you need to cancel or reschedule your appointment.   If you are experiencing a Mental Health or [place] or need someone to talk to, please call the Suicide and Crisis Lifeline: 988 call the [place] National Suicide Prevention Lifeline: 661-696-4716 or TTY: 907-727-1174 TTY (223)329-9733) to talk to a trained counselor call 1-800-273-TALK (toll free, 24 hour hotline) call the [facility]: (951) [phone] call 911   The patient verbalized understanding of instructions, educational materials, and care plan provided today and DECLINED offer to receive copy of patient instructions, educational materials, and care plan.   The patient has been provided with contact information for the care management team and has been advised to call with any health related questions or concerns.   [employer] [facility] Lavina Hamman, RN, BSN, [place] Coordinator Office number [phone]

## 2021-12-17 NOTE — Patient Outreach (Signed)
  Care Coordination   Initial Visit Note   12/17/2021 Name: Tonya Carpenter MRN: [id number] DOB: 04-Nov-1945  Tonya Carpenter is a 76 y.o. year old female who sees [name], Fransisca Kaufmann, MD for primary care. I spoke with  Tonya Carpenter by phone today.  What matters to the patients health and wellness today?  Denies any medical concerns More interested in sharing and possibly finding out who left her 5 bags of food on 12/16/21 while she was at church She is Very appreciative Confirmed [facility] SW is not aware of a food delivery Patient questions if it is from a Cancer center SW, [place] Addressed               This Visit's Progress     Patient Stated     Maintain Diabetes [facility]) (pt-stated)   On track     Care Coordination Interventions: Discussed plans with patient for ongoing care management follow up and provided patient with direct contact information for care management team Screening for signs and symptoms of depression related to chronic disease state  Assessed social determinant of health barriers Last HgA1c = 6.2 on 10/26/21        SDOH assessments and interventions completed:  Yes  SDOH Interventions Today    Flowsheet Row Most Recent Value  SDOH Interventions   Food Insecurity Interventions Intervention Not Indicated  Transportation Interventions Intervention Not Indicated  Social Connections Interventions Intervention Not Indicated        Care Coordination Interventions Activated:  Yes  Care Coordination Interventions:  Yes, provided   Follow up plan: Follow up call scheduled for 01/17/22 10 am    Encounter Outcome:  Pt. Visit Completed   Myan Locatelli L. Lavina Hamman, RN, BSN, [place] Coordinator Office number [phone]

## 2021-12-19 ENCOUNTER — Telehealth: Payer: Self-pay | Admitting: Family Medicine

## 2021-12-19 NOTE — Telephone Encounter (Signed)
Hi Tonya Carpenter,  Tonya Carpenter has come to our office twice regarding the paperwork and info you sent to her in regards to applying for medicaid assistance in order to have help at her home.  Unfortunately Tonya Carpenter has already tried applying for medicaid and has been denied several times because she makes too much income. I think all of the paperwork has got her all confused as to what she needs to do. I told her that I would contact you about this and have you contact her with an update.  Could you reach out to one of these companies who accept her medicare insurance plan and see if they would be able to help?  [place] [phone]) [place] [phone]) [facility] [phone])  [place] Development Director

## 2021-12-24 ENCOUNTER — Other Ambulatory Visit: Payer: Self-pay | Admitting: Family Medicine

## 2021-12-24 DIAGNOSIS — E1159 Type 2 diabetes mellitus with other circulatory complications: Secondary | ICD-10-CM

## 2021-12-26 ENCOUNTER — Ambulatory Visit: Payer: Self-pay | Admitting: *Deleted

## 2021-12-26 NOTE — Patient Outreach (Signed)
  Care Coordination   12/26/2021  Name: Tonya Carpenter MRN: [id number] DOB: 11-14-1945   Care Coordination Outreach Attempts:  An unsuccessful telephone outreach was attempted today to offer the patient information about available care coordination services as a benefit of their health plan. HIPAA compliant messages left on voicemail, providing contact information for CSW, encouraging patient to return CSW's call at her earliest convenience.  Follow Up Plan:  Additional outreach attempts will be made to offer the patient care coordination information and services.   Encounter Outcome:  No Answer.   Care Coordination Interventions Activated:  No.    Care Coordination Interventions:  No, not indicated.    Nat Christen, BSW, MSW, LCSW  Licensed Education officer, environmental Health System  Mailing [place] [address]. [address] [place], [place] [address] Physical Address-[address], [place], [place] [address] Toll Free Main # [phone] Fax # [phone] Cell # [phone] [email or web address]

## 2021-12-28 ENCOUNTER — Telehealth: Payer: Self-pay

## 2021-12-28 NOTE — Chronic Care Management (AMB) (Signed)
  Care Coordination Note  12/28/2021 Name: Tonya Carpenter MRN: [id number] DOB: 06-28-1945  Tonya Carpenter is a 76 y.o. year old female who is a primary care patient of [name], Fransisca Kaufmann, MD and is actively engaged with the care management team. I reached out to [place] by phone today to assist with re-scheduling a follow up visit with the Licensed Clinical Social Worker  Follow up plan: Unsuccessful telephone outreach attempt made. A HIPAA compliant phone message was left for the patient providing contact information and requesting a return call.  The care management team will reach out to the patient again over the next 7 days.  If patient returns call to provider office, please advise to call  [place]  at [place], [place] Management  [place], [place] [address] Direct Dial: [phone] [email or web address]

## 2021-12-31 ENCOUNTER — Other Ambulatory Visit: Payer: Self-pay | Admitting: Family Medicine

## 2022-01-09 NOTE — Chronic Care Management (AMB) (Unsigned)
  Care Coordination Note  01/09/2022 Name: Tonya Carpenter MRN: [id number] DOB: 1945-08-23  Tonya Carpenter is a 76 y.o. year old female who is a primary care patient of [name], Fransisca Kaufmann, MD and is actively engaged with the care management team. I reached out to [place] by phone today to assist with re-scheduling a follow up visit with the Licensed Clinical Social Worker  Follow up plan: Unsuccessful telephone outreach attempt made. The care management team will reach out to the patient again over the next 7 days.  If patient returns call to provider office, please advise to call  [place]  at [place], [place] Management  [place], [place] [address] Direct Dial: [phone] [email or web address]

## 2022-01-10 NOTE — Chronic Care Management (AMB) (Signed)
  Care Coordination Note  01/10/2022 Name: Tonya Carpenter MRN: [id number] DOB: 01/20/1946  Tonya Carpenter is a 76 y.o. year old female who is a primary care patient of [name], Fransisca Kaufmann, MD and is actively engaged with the care management team. I reached out to [place] by phone today to assist with re-scheduling a follow up visit with the Licensed Clinical Social Worker  Follow up plan: Unable to make contact on outreach attempts x 3. PCP [name], Fransisca Kaufmann, MD notified via routed documentation in medical record.   Tonya Carpenter, Tonya Carpenter, Tonya Carpenter Direct Dial: [phone] [email or web address]

## 2022-01-17 ENCOUNTER — Ambulatory Visit: Payer: Self-pay | Admitting: *Deleted

## 2022-01-17 NOTE — Patient Outreach (Signed)
  Care Coordination   Follow Up Visit Note   01/21/2022 Name: Tonya Carpenter MRN: [id number] DOB: Aug 16, 1945  Tonya Carpenter is a 76 y.o. year old female who sees [name], Fransisca Kaufmann, MD for primary care. I spoke with  Tonya Carpenter by phone today.  What matters to the patients health and wellness today?  Has medicaid forms completed Does not read well + Memory concerns  Has SSI- on fixed income Told her SW will call her on 01/24/22 at 2:30 pm  Tonya Carpenter is her main support system    Goals Addressed               This Visit's Progress     Patient Stated     memory, anxiety & knowledge deficits ([place]) (pt-stated)        Care Coordination Interventions: Barriers: being available for outreaches Memory and literacy Evaluation of current treatment plan related to anxiety/memory/knowledge deficits and patient's adherence to plan as established by provider Advised patient to provide appropriate vaccination information to provider or CM team member at next visit Advised patient to keep a note book with her to write down reminders Discussed plans with patient for ongoing care management follow up and provided patient with direct contact information for care management team Assessed social determinant of health barriers Assisted to scheduled her with [facility] SW for 01/24/22 at 2:30 om with Amber [facility] CMA Active listening           SDOH assessments and interventions completed:  No     Care Coordination Interventions Activated:  Yes  Care Coordination Interventions:  Yes, provided   Follow up plan: Follow up call scheduled for 01/21/22 0930    Encounter Outcome:  Pt. Visit Completed   Tonya Saia L. Lavina Hamman, RN, BSN, [place] Coordinator Office number [phone]

## 2022-01-21 ENCOUNTER — Ambulatory Visit: Payer: Self-pay | Admitting: *Deleted

## 2022-01-21 NOTE — Patient Outreach (Signed)
  Care Coordination   01/21/2022 Name: Tonya Carpenter MRN: [id number] DOB: [birth year]   Care Coordination Outreach Attempts:  An unsuccessful telephone outreach was attempted today to offer the patient information about available care coordination services as a benefit of their health plan.   Follow Up Plan:  Additional outreach attempts will be made to offer the patient care coordination information and services.   Encounter Outcome:  No Answer  Care Coordination Interventions Activated:  No   Care Coordination Interventions:  No, not indicated    Khylon Davies L. Lavina Hamman, RN, BSN, [place] Coordinator Office number [phone]

## 2022-01-21 NOTE — Patient Instructions (Signed)
Visit Information  Thank you for taking time to visit with me today. Please don't hesitate to contact me if I can be of assistance to you.   Following are the goals we discussed today:   Goals Addressed               This Visit's Progress     Patient Stated     memory, anxiety & knowledge deficits ([facility]) (pt-stated)        Care Coordination Interventions: Barriers: being available for outreaches Memory and literacy Evaluation of current treatment plan related to anxiety/memory/knowledge deficits and patient's adherence to plan as established by provider Advised patient to provide appropriate vaccination information to provider or CM team member at next visit Advised patient to keep a note book with her to write down reminders Discussed plans with patient for ongoing care management follow up and provided patient with direct contact information for care management team Assessed social determinant of health barriers Assisted to scheduled her with [facility] SW for 01/24/22 at 2:30 om with Amber [facility] CMA Active listening           Our next appointment is by telephone on 01/21/22 at 0930  Please call the care guide team at [phone] if you need to cancel or reschedule your appointment.   If you are experiencing a Mental Health or [place] or need someone to talk to, please call the Suicide and Crisis Lifeline: 988 call the [place] National Suicide Prevention Lifeline: 321-256-5496 or TTY: (513)765-2489 TTY 712 028 7356) to talk to a trained counselor call 1-800-273-TALK (toll free, 24 hour hotline) call the [facility]: 2018833864 call 911   The patient verbalized understanding of instructions, educational materials, and care plan provided today and DECLINED offer to receive copy of patient instructions, educational materials, and care plan.   The patient has been provided with contact information for the care management team and has been advised to  call with any health related questions or concerns.   Fernande Treiber L. Lavina Hamman, RN, BSN, [place] Coordinator Office number [phone]

## 2022-01-23 ENCOUNTER — Ambulatory Visit: Payer: Self-pay | Admitting: *Deleted

## 2022-01-23 NOTE — Patient Outreach (Signed)
  Care Coordination   01/23/2022 Name: DORY DEMONT MRN: [id number] DOB: November 13, 1945   Care Coordination Outreach Attempts:  A second unsuccessful outreach was attempted today to offer the patient with information about available care coordination services as a benefit of their health plan.     Attempt to remind patient of scheduled outreach from [facility] SW  No answer No voice message able to be left   Follow Up Plan:  Additional outreach attempts will be made to offer the patient care coordination information and services.   Encounter Outcome:  No Answer  Care Coordination Interventions Activated:  No   Care Coordination Interventions:  No, not indicated    Michaelle Bottomley L. Noelle Penner, RN, BSN, CCM [facility] Care Coordinator Office number [phone]

## 2022-01-24 ENCOUNTER — Ambulatory Visit: Payer: Self-pay | Admitting: *Deleted

## 2022-01-24 NOTE — Patient Outreach (Signed)
  Care Coordination   01/24/2022  Name: SRIJA SOUTHARD MRN: [id number] DOB: [birth year]   Care Coordination Outreach Attempts:  A third unsuccessful outreach was attempted today to offer the patient with information about available care coordination services as a benefit of their health plan. HIPAA compliant messages left on voicemail, providing contact information for CSW, encouraging patient to return CSW's call at her earliest convenience.  Follow Up Plan:  No further outreach attempts will be made at this time. We have been unable to contact the patient to offer or enroll patient in care coordination services.  Encounter Outcome:  No Answer.   Care Coordination Interventions Activated:  No.    Care Coordination Interventions:  No, not indicated.    Danford Bad, BSW, MSW, LCSW  Licensed Restaurant manager, fast food Health System  Mailing [place] [address]. [address] [place], [place] [address] Physical Address[address], [place], [place] [address] Toll Free Main # [phone] Fax # [phone] Cell # [phone] [email or web address]

## 2022-01-25 ENCOUNTER — Telehealth: Payer: Self-pay | Admitting: Family Medicine

## 2022-01-30 ENCOUNTER — Ambulatory Visit: Payer: Medicare HMO | Admitting: *Deleted

## 2022-01-30 ENCOUNTER — Ambulatory Visit: Payer: Self-pay | Admitting: *Deleted

## 2022-01-30 ENCOUNTER — Encounter: Payer: Self-pay | Admitting: *Deleted

## 2022-01-30 DIAGNOSIS — R413 Other amnesia: Secondary | ICD-10-CM

## 2022-01-30 DIAGNOSIS — Z550 Illiteracy and low-level literacy: Secondary | ICD-10-CM | POA: Insufficient documentation

## 2022-01-30 DIAGNOSIS — G8929 Other chronic pain: Secondary | ICD-10-CM

## 2022-01-30 NOTE — Patient Outreach (Incomplete)
Care Coordination   Follow Up Visit Note   01/30/2022 Name: Tonya Carpenter MRN: [id number] DOB: Apr 25, 1945  Tonya Carpenter is a 76 y.o. year old female who sees [name], Elige Radon, MD for primary care. I spoke with  Tonya Carpenter at her primary care provider (PCP) office today.  What matters to the patients health and wellness today?  "Getting help", resources  Memory loss "I don't remember so well now" She reports this began to happen before her husband passed.  She is oriented to person, place but has difficulty with time and situation. She provided the incorrect day of the week "Thursday" and had noted with more decrease memory of present events vs past events. Time needed to be provided for her to recall past events. She would blurt out an answer to a question during a conversation about another subject. Questions had to be asked from 2-4 times before she was able to discuss a subject being addressed. She repeated statements, forgot names She reports her memory changes began to occur prior to the loss of her husband, Tonya Carpenter (cancer)  She has not been diagnosed with dementia/alzheimer. Dr [name] reports cognition testing has been completed and she scores within normal range.  She reports her primary support is her church and church members. When she is questioned about what "getting help" means to her, she confirms she can complete bathing, dressing, feeding and walking to places in [place] to complete errands and walk to her pcp office. She does discuss needing some one to remind her of things, cooking, house cleaning. She discussed a confrontation with a female neighbor related to her nervousness & memory. She confirms she has not been threaten, hurt or abused When offered assist with independent living, she denied the offer and stated she would "hold off on that until I really needed it"  When adult day centers were discussed, she began speaking about another subject and was not able to be  redirected back to the subject. She began providing cards from her purse for the SW & RN CM t   Goals Addressed               This Visit's Progress     Patient Stated   .  memory, anxiety & knowledge deficits (THN) (pt-stated)   Not on track     Care Coordination Interventions: Barriers: being unavailable for outreaches Memory and literacy Unsuccessful call attempts to patient x 3 by [facility] RN CM & SW even after scheduled an appointment with her to speak with [facility] SW  Review of chart, noted patient request to speak with office staff, [name] for help Sent secure chat to pcp office staff, [name] to have pcp RN encourage patient during a scheduled 01/31/22 office visit to return a call [facility] SW and [facility] RN CM to attempt to help schedule an office visit with her  Sent a message to [facility] SW          SDOH assessments and interventions completed:  Yes{THN Tip this will not be part of the note when signed-REQUIRED REPORT FIELD DO NOT DELETE (Optional):27901}     Care Coordination Interventions Activated:  Yes {THN Tip this will not be part of the note when signed-REQUIRED REPORT FIELD DO NOT DELETE (Optional):27901} Care Coordination Interventions:  Yes, provided {THN Tip this will not be part of the note when signed-REQUIRED REPORT FIELD DO NOT DELETE (Optional):27901}  Follow up plan: Follow up call scheduled for ***  Encounter Outcome:  Pt. Visit Completed {THN Tip this will not be part of the note when signed-REQUIRED REPORT FIELD DO NOT DELETE (Optional):27901}  Vartan Kerins L. Noelle Penner, RN, BSN, CCM [facility] Care Coordinator Office number [phone]

## 2022-01-30 NOTE — Telephone Encounter (Signed)
Had attempted to contact pt. She is meeting w/ [facility] today. Closing encounter. I had completed PCS & CAPS forms for pt.

## 2022-01-30 NOTE — Patient Outreach (Addendum)
  Care Coordination   01/30/2022 Name: Tonya Carpenter MRN: [id number] DOB: 1946/02/25   Care Coordination Outreach Attempts:  A third unsuccessful outreach was attempted today to offer the patient with information about available care coordination services as a benefit of their health plan.    No answer at her home number  Left a message at her mobile number   Unsuccessful call attempts to patient x 3 by [facility] RN CM & SW even after scheduled an appointment with her to speak with [facility] SW  Review of chart, noted patient request to speak with office staff, [name] for help Sent secure chat to pcp office staff, [name] to have pcp RN encourage patient during a scheduled 01/31/22 office visit to return a call [facility] SW and [facility] RN CM to attempt to help schedule an office visit with her  Sent a message to [facility] SW   Follow Up Plan:  No further outreach attempts will be made at this time. We have been unable to contact the patient to offer or enroll patient in care coordination services  Encounter Outcome:  No Answer  Care Coordination Interventions Activated:  No   Care Coordination Interventions:  Yes, provided     Shaquavia Whisonant L. Noelle Penner, RN, BSN, CCM [facility] Care Coordinator Office number [phone]

## 2022-01-30 NOTE — Patient Outreach (Incomplete)
Care Coordination   Follow Up Visit Note   01/31/2022 Name: Tonya Carpenter MRN: [id number] DOB: 1945/03/28  Tonya Carpenter is a 76 y.o. year old female who sees Tonya Carpenter, Tonya Radon, MD for primary care. I spoke with  Tonya Carpenter at her primary care provider (PCP) office today.  What matters to the patients health and wellness today?  "Getting help", resources She states she wants Community Alternatives Programs (CAP) She states she waited for a CAP staff to visit her "last week" but she did not receive a visit.  With [facility] SW assessment she was found not to have full medicaid that is needed for Personal care services [facility]) aide. She has Medicaid ([place]) that covers her premium for her medications. No Veteran's administration (VA) benefits.  Other options were offered and information gathered to allow [facility] SW to begin to seek other resources    Memory loss "I don't remember so well now" She reports this began to happen before her husband passed.  She is oriented to person, place but has difficulty with time and situation. She provided the incorrect day of the week "Thursday" and had noted with more decrease memory of present events vs past events. Time needed to be provided for her to recall past events. She would blurt out an answer to a question during a conversation about another subject. Questions had to be asked from 2-4 times before she was able to discuss a subject being addressed. She repeated statements, forgot names and was observed noticeably looking off in space (difficulty concentrating) and fidgeting with items in her purse when asked questions that required making decisions or when too much information was discussed. If the SW asked a question, the RN CM had to reword the question and make it simple before she would answer She reports her memory changes began to occur prior to the loss of her husband, Tonya Carpenter (cancer). She again confirms she does not read nor write well but completed the 12 th  grade She has not been diagnosed with dementia/alzheimer. Dr Tonya Carpenter reports cognition testing has been completed and she scores within normal range.  She reports her primary support is her Tonya Carpenter and church members (Tonya Carpenter) When she is questioned about what "getting help" means to her, she confirms she can complete bathing, dressing, feeding and walking to places in [place] to complete errands and walk to her pcp office. She reports she and her family lived in [place] prior to moving to [place]. She reports use of [place] transportation for any out of county appointments.  She does discuss needing some one to remind her of things, cooking, cleaning of her Section 8 housing/apartment.. She discussed a confrontation with a female neighbor related to her nervousness & memory. She confirms she has not been threaten, hurt or abused. She does voice concern with others "thinking I am stupid" When offered assist with independent living, she denied the offer and stated she would "hold off on that until I really needed it"  Requests for personal assistance has been completed by her pcp staff (01/03/22). [place] [facility] staff have stated impact on ADLs information is missing.(01/23/22) When adult day centers were discussed, she began speaking about another subject and was not able to be redirected back to the subject. She began providing cards from her purse for the SW & RN CM to review.    Diabetes- She reports some cbg values in the 200's since the last successful outreach with  RN CM. She is able to report that the elevations occurred because she had not been following her diet and also checked the cbg after eating sweets  Medicines- Received via mail order services  [name] inquired about home visits and agreed to schedule one with Tonya Carpenter SW & CM  Goals Addressed               This Visit's Progress     Patient Stated     Maintain Diabetes [facility]) (pt-stated)        Care Coordination  Interventions: Discussed plans with patient for ongoing care management follow up and provided patient with direct contact information for care management team Screening for signs and symptoms of depression related to chronic disease state  Assessed social determinant of health barriers Last HgA1c = 6.2 on 10/26/21 Inquired about any worsening changes, cbg values                   memory, anxiety & knowledge deficits (THN) (pt-stated)   Not on track     Care Coordination Interventions: Barriers: being unavailable for outreaches Memory and literacy Unsuccessful call attempts to patient x 3 by [facility] RN CM & SW even after scheduled an appointment with her to speak with [facility] SW noted on 01/30/22 Review of chart, noted patient request to speak with office staff, Tonya Carpenter for help Sent secure chat to pcp office staff, Tonya Carpenter to have pcp RN encourage patient during the scheduled 01/31/22 office visit to return a call to [facility] SW and [facility] RN CM so an attempt to help schedule an office visit with her can be made Sent a message to [facility] SW  Outreached again to [name] and with some redirection was able to get her to agree to meet with [facility] SW and RN CM today 01/30/22 at 2:30 pm at her pcp office Assessed "nervousness"/anxiety- she confirms she has been anxious "all my life", Inquired about counseling and medical treatments for anxiety Confirmed she is willing to be provided assistance, resources and medical providers to help her cope with her anxiety  as needed Assessed for memory changes. Noted more decrease memory of present events vs past events. Time needed to be provided for her to recall past events. Assessed her social support. Discussed the differences in skilled nursing facilities and independent living facilities. Answered questions Encouragement and acknowledgement provided when she discussed her anxiety and illiteracy Discussed with her that her anxiety and literacy are real,  matter and she should  not feel judged by others. Encouraged positive self talk Reviewed [id number] request completed on 01/03/22 and spoke with Dr Tonya Carpenter Added low level literacy, memory changes and chronic right shoulder pain to problem list          SDOH assessments and interventions completed:  Yes     Care Coordination Interventions Activated:  Yes  Care Coordination Interventions:  Yes, provided   Follow up plan: Follow up call scheduled for 02/13/22    Encounter Outcome:  Pt. Visit Completed   Jarome Trull L. Noelle Penner, RN, BSN, CCM [facility] Care Coordinator Office number [phone]

## 2022-01-31 ENCOUNTER — Telehealth: Payer: Self-pay | Admitting: *Deleted

## 2022-01-31 ENCOUNTER — Encounter: Payer: Self-pay | Admitting: *Deleted

## 2022-01-31 ENCOUNTER — Ambulatory Visit (INDEPENDENT_AMBULATORY_CARE_PROVIDER_SITE_OTHER): Payer: Medicare HMO | Admitting: Family Medicine

## 2022-01-31 ENCOUNTER — Encounter: Payer: Self-pay | Admitting: Family Medicine

## 2022-01-31 VITALS — BP 122/71 | HR 57 | Temp 97.4°F | Ht 63.0 in | Wt 149.0 lb

## 2022-01-31 DIAGNOSIS — F03B0 Unspecified dementia, moderate, without behavioral disturbance, psychotic disturbance, mood disturbance, and anxiety: Secondary | ICD-10-CM | POA: Insufficient documentation

## 2022-01-31 DIAGNOSIS — E785 Hyperlipidemia, unspecified: Secondary | ICD-10-CM

## 2022-01-31 DIAGNOSIS — R413 Other amnesia: Secondary | ICD-10-CM

## 2022-01-31 DIAGNOSIS — I152 Hypertension secondary to endocrine disorders: Secondary | ICD-10-CM | POA: Diagnosis not present

## 2022-01-31 DIAGNOSIS — E1159 Type 2 diabetes mellitus with other circulatory complications: Secondary | ICD-10-CM | POA: Diagnosis not present

## 2022-01-31 DIAGNOSIS — N183 Chronic kidney disease, stage 3 unspecified: Secondary | ICD-10-CM

## 2022-01-31 DIAGNOSIS — E1169 Type 2 diabetes mellitus with other specified complication: Secondary | ICD-10-CM | POA: Diagnosis not present

## 2022-01-31 DIAGNOSIS — G8929 Other chronic pain: Secondary | ICD-10-CM

## 2022-01-31 DIAGNOSIS — E1122 Type 2 diabetes mellitus with diabetic chronic kidney disease: Secondary | ICD-10-CM | POA: Diagnosis not present

## 2022-01-31 HISTORY — DX: Other amnesia: R41.3

## 2022-01-31 HISTORY — DX: Other chronic pain: G89.29

## 2022-01-31 LAB — CBC WITH DIFFERENTIAL/PLATELET
Basophils Absolute: 0 10*3/uL (ref 0.0–0.2)
Basos: 0 %
EOS (ABSOLUTE): 0.2 10*3/uL (ref 0.0–0.4)
Eos: 2 %
Hematocrit: 36.9 % (ref 34.0–46.6)
Hemoglobin: 11.2 g/dL (ref 11.1–15.9)
Immature Grans (Abs): 0 10*3/uL (ref 0.0–0.1)
Immature Granulocytes: 0 %
Lymphocytes Absolute: 1.9 10*3/uL (ref 0.7–3.1)
Lymphs: 22 %
MCH: 26.3 pg — ABNORMAL LOW (ref 26.6–33.0)
MCHC: 30.4 g/dL — ABNORMAL LOW (ref 31.5–35.7)
MCV: 87 fL (ref 79–97)
Monocytes Absolute: 0.6 10*3/uL (ref 0.1–0.9)
Monocytes: 7 %
Neutrophils Absolute: 5.9 10*3/uL (ref 1.4–7.0)
Neutrophils: 69 %
Platelets: 258 10*3/uL (ref 150–450)
RBC: 4.26 x10E6/uL (ref 3.77–5.28)
RDW: 12 % (ref 11.7–15.4)
WBC: 8.7 10*3/uL (ref 3.4–10.8)

## 2022-01-31 LAB — CMP14+EGFR
ALT: 12 IU/L (ref 0–32)
AST: 17 IU/L (ref 0–40)
Albumin/Globulin Ratio: 1.2 (ref 1.2–2.2)
Albumin: 4.2 g/dL (ref 3.8–4.8)
Alkaline Phosphatase: 63 IU/L (ref 44–121)
BUN/Creatinine Ratio: 10 — ABNORMAL LOW (ref 12–28)
BUN: 13 mg/dL (ref 8–27)
Bilirubin Total: 0.3 mg/dL (ref 0.0–1.2)
CO2: 26 mmol/L (ref 20–29)
Calcium: 9.8 mg/dL (ref 8.7–10.3)
Chloride: 101 mmol/L (ref 96–106)
Creatinine, Ser: 1.35 mg/dL — ABNORMAL HIGH (ref 0.57–1.00)
Globulin, Total: 3.5 g/dL (ref 1.5–4.5)
Glucose: 125 mg/dL — ABNORMAL HIGH (ref 70–99)
Potassium: 4 mmol/L (ref 3.5–5.2)
Sodium: 140 mmol/L (ref 134–144)
Total Protein: 7.7 g/dL (ref 6.0–8.5)
eGFR: 41 mL/min/{1.73_m2} — ABNORMAL LOW (ref >59)

## 2022-01-31 LAB — LIPID PANEL
Chol/HDL Ratio: 2.7 ratio (ref 0.0–4.4)
Cholesterol, Total: 160 mg/dL (ref 100–199)
HDL: 59 mg/dL (ref >39)
LDL Chol Calc (NIH): 80 mg/dL (ref 0–99)
Triglycerides: 119 mg/dL (ref 0–149)
VLDL Cholesterol Cal: 21 mg/dL (ref 5–40)

## 2022-01-31 LAB — BAYER DCA HB A1C WAIVED: HB A1C (BAYER DCA - WAIVED): 6.8 % — ABNORMAL HIGH (ref 4.8–5.6)

## 2022-01-31 NOTE — Progress Notes (Signed)
BP 122/71   Pulse (!) 57   Temp (!) 97.4 F (36.3 C)   Ht _0  (1.6 m)   Wt 149 lb (67.6 kg)   SpO2 94%   BMI 26.39 kg/m    Subjective:   Patient ID: Tonya Carpenter, female    DOB: 06-Feb-1946, 76 y.o.   MRN: [id number]  HPI: Tonya Carpenter is a 76 y.o. female presenting on 01/31/2022 for Medical Management of Chronic Issues, Diabetes, and Memory Loss   HPI Type 2 diabetes mellitus Patient comes in today for recheck of his diabetes. Patient has been currently taking Janumet. Patient is currently on an ACE inhibitor/ARB. Patient has not seen an ophthalmologist this year. Patient denies any new issues with their feet. The symptom started onset as an adult hypertension and hyperlipidemia ARE RELATED TO DM   Hyperlipidemia Patient is coming in for recheck of his hyperlipidemia. The patient is currently taking atorvastatin. They deny any issues with myalgias or history of liver damage from it. They deny any focal numbness or weakness or chest pain.   Hypertension Patient is currently on hydralazine and losartan hydrochlorothiazide and metoprolol, and their blood pressure today is 122/71. Patient denies any lightheadedness or dizziness. Patient denies headaches, blurred vision, chest pains, shortness of breath, or weakness. Denies any side effects from medication and is content with current medication.   Memory changes and dementia Patient has memory changes that are worsening gradually.  She also has low-level literacy which is definitely affecting the memory changes.    01/30/2022   11:51 PM 12/17/2021   10:35 AM 11/29/2021   12:33 PM 11/14/2021   10:29 AM 10/01/2021    3:19 PM  Depression screen PHQ 2/9  Decreased Interest 0 0 _1 Down, Depressed, Hopeless _2 PHQ - 2 Score _3 Altered sleeping   0 1 1  Tired, decreased energy   _4 Change in appetite   0 1 1  Feeling bad or failure about yourself    0 1 1  Trouble concentrating   0 1 1  Moving slowly or  fidgety/restless   0 1 1  Suicidal thoughts   0 0 0  PHQ-9 Score   _5 Difficult doing work/chores   Somewhat difficult Somewhat difficult Somewhat difficult     Relevant past medical, surgical, family and social history reviewed and updated as indicated. Interim medical history since our last visit reviewed. Allergies and medications reviewed and updated.  Review of Systems  Constitutional:  Negative for chills and fever.  Eyes:  Negative for visual disturbance.  Respiratory:  Negative for chest tightness and shortness of breath.   Cardiovascular:  Negative for chest pain and leg swelling.  Musculoskeletal:  Negative for back pain and gait problem.  Skin:  Negative for rash.  Neurological:  Negative for dizziness, light-headedness and headaches.  Psychiatric/Behavioral:  Negative for agitation and behavioral problems.   All other systems reviewed and are negative.   Per HPI unless specifically indicated above   Allergies as of 01/31/2022       Reactions   Trilyte [peg [id number] Bicarb-nacl]    Nausea/vomtiing        Medication List        Accurate as of January 31, 2022 10:13 AM. If you have any questions, ask your nurse or doctor.          Accu-Chek Guide  test strip Generic drug: glucose blood TEST BLOOD SUGAR TWICE DAILY AND AS NEEDED FOR DIABETES   Accu-Chek Guide w/Device Kit USE AS DIRECTED FOR DIABETES   acetaminophen 500 MG tablet Commonly known as: TYLENOL Take 1,000 mg by mouth every 6 (six) hours as needed for mild pain or headache.   Aspirin Low Dose 81 MG tablet Generic drug: aspirin EC TAKE 1 TABLET EVERY DAY - SWALLOW WHOLE   atorvastatin 20 MG tablet Commonly known as: LIPITOR Take 1 tablet (20 mg total) by mouth daily.   cholecalciferol 25 MCG (1000 UNIT) tablet Commonly known as: VITAMIN D3 TAKE 1 TABLET EVERY DAY   diclofenac Sodium 1 % Gel Commonly known as: VOLTAREN Apply 2 g topically 4 (four) times daily as needed.    DropSafe Alcohol Prep 70 % Pads TEST BLOOD SUGAR TWICE DAILY AND AS NEEDED   feeding supplement Liqd Take 237 mLs by mouth daily.   Fluad Quadrivalent 0.5 ML injection Generic drug: influenza vaccine adjuvanted   hydrALAZINE 10 MG tablet Commonly known as: APRESOLINE Take 1 tablet (10 mg total) by mouth 2 (two) times daily.   Janumet XR 50-500 MG Tb24 Generic drug: SitaGLIPtin-MetFORMIN HCl TAKE 1 TABLET TWICE DAILY   losartan-hydrochlorothiazide 50-12.5 MG tablet Commonly known as: HYZAAR TAKE 1 TABLET EVERY DAY   metoprolol succinate 50 MG 24 hr tablet Commonly known as: TOPROL-XL TAKE 1 TABLET (50 MG TOTAL) DAILY. TAKE WITH OR IMMEDIATELY FOLLOWING A MEAL.   nitroGLYCERIN 0.4 MG SL tablet Commonly known as: NITROSTAT DISSOLVE 1 TABLET UNDER THE TONGUE EVERY 5 MINUTES AS NEEDED FOR CHEST PAIN   omeprazole 40 MG capsule Commonly known as: PRILOSEC Take 1 capsule (40 mg total) by mouth daily.   polyethylene glycol 17 g packet Commonly known as: MIRALAX / GLYCOLAX Take 17 g by mouth daily as needed.   REFRESH RELIEVA OP Apply to eye.   True Metrix Level 1 Low Soln Use as needed with BS machine   TRUEdraw Lancing Device Misc Teset BS BID Dx E11.9   TRUEplus Lancets 30G Misc Teset BS BID Dx E11.9   Accu-Chek Softclix Lancets lancets TEST BLOOD SUGAR TWICE DAILY AND AS NEEDED FOR DIABETES   Prodigy Twist Top Lancets 28G Misc TEST BLOOD SUGAR TWICE DAILY Dx E11.9         Objective:   BP 122/71   Pulse (!) 57   Temp (!) 97.4 F (36.3 C)   Ht _0  (1.6 m)   Wt 149 lb (67.6 kg)   SpO2 94%   BMI 26.39 kg/m   Wt Readings from Last 3 Encounters:  01/31/22 149 lb (67.6 kg)  10/29/21 148 lb (67.1 kg)  10/26/21 148 lb (67.1 kg)    Physical Exam Vitals and nursing note reviewed.  Constitutional:      General: She is not in acute distress.    Appearance: She is well-developed. She is not diaphoretic.  Eyes:     Conjunctiva/sclera: Conjunctivae  normal.  Cardiovascular:     Rate and Rhythm: Normal rate and regular rhythm.     Heart sounds: Normal heart sounds. No murmur heard. Pulmonary:     Effort: Pulmonary effort is normal. No respiratory distress.     Breath sounds: Normal breath sounds. No wheezing.  Musculoskeletal:        General: No swelling or tenderness. Normal range of motion.  Skin:    General: Skin is warm and dry.     Findings: No rash.  Neurological:  Mental Status: She is alert and oriented to person, place, and time.     Coordination: Coordination normal.  Psychiatric:        Behavior: Behavior normal.       Assessment & Plan:   Problem List Items Addressed This Visit       Cardiovascular and Mediastinum   Hypertension associated with diabetes ([place])   Relevant Orders   CBC with Differential/Platelet   CMP14+EGFR   Lipid panel   Bayer DCA Hb A1c Waived     Endocrine   Type 2 diabetes mellitus ([place]) - Primary   Relevant Orders   CBC with Differential/Platelet   CMP14+EGFR   Lipid panel   Bayer DCA Hb A1c Waived   Hyperlipidemia associated with type 2 diabetes mellitus (HCC)   Relevant Orders   CBC with Differential/Platelet   CMP14+EGFR   Lipid panel   Bayer DCA Hb A1c Waived   CKD stage 3 due to type 2 diabetes mellitus (HCC)     Nervous and Auditory   Unspecified dementia, moderate, without behavioral disturbance, psychotic disturbance, mood disturbance, and anxiety (HCC)  She feels like she is becoming more forgetful.  MMSE was 18 out of 30 today, her memory has worsened but also some of that is due to low education and low literacy level.  A1c looks good at 6.8.  Blood pressure looks good.  No change in medication.  We are seeing if [facility] can help get assistance for her in the house.  Follow up plan: Return in about 3 months (around 05/03/2022), or if symptoms worsen or fail to improve, for diabetes and htn.  Counseling provided for all of the vaccine components Orders Placed  This Encounter  Procedures   CBC with Differential/Platelet   CMP14+EGFR   Lipid panel   Bayer DCA Hb A1c Waived    Caryl Pina, MD [facility] 01/31/2022, 10:13 AM

## 2022-01-31 NOTE — Patient Outreach (Signed)
  Care Coordination   Multidisciplinary Case Review Note    01/31/2022 Name: Tonya Carpenter MRN: [id number] DOB: February 25, 1946  Tonya Carpenter is a 76 y.o. year old female who sees [name], Fransisca Kaufmann, MD for primary care.  The  multidisciplinary care team met today to review patient care needs and barriers.     Goals Addressed               This Visit's Progress     Patient Stated     memory, anxiety, home care resources & knowledge deficits [facility]) (pt-stated)   Not on track     Care Coordination Interventions: Barriers: being unavailable for outreaches Memory and literacy Collaborated with [facility] Multidisciplinary team members for [place] pod 1 regarding plan of care Reviewed [id number] request completed on 01/03/22 & office visit with patient Ruled out resources: Personal care services [facility]) aide related to independence with ADLs, Full Medicaid for 2023 as over qualified per income, No county Aging gracefully services  Recommended plan: [facility] RN CM to continue office or home visits. Collaborate with PCP and SW for possible changes that may qualify patient for PCS or full medicaid. Continue education on home care for DM, HTN + & provided resources for specialists [facility] staff for anxiety, dentist, ? cardiology, neurology .) [facility] SW will assist with re applying for Medicaid in January 2024 to see if the new medicaid expansion helps        SDOH assessments and interventions completed:  No     Care Coordination Interventions Activated:  Yes   Care Coordination Interventions:  Yes, provided   Follow up plan: Follow up call scheduled for 02/13/22    Multidisciplinary Team Attendees:   Jackelyn Poling, RN CM Nat Christen, SW Chong Sicilian, RN CM  Scribe for Multidisciplinary Case Review:  Mickel Crow. Lavina Hamman, RN, BSN, [place] Coordinator Office number [phone]

## 2022-01-31 NOTE — Patient Instructions (Addendum)
Visit Information  Thank you for taking time to visit with me today. Please don't hesitate to contact me if I can be of assistance to you.   Following are the goals we discussed today:   Goals Addressed               This Visit's Progress     Patient Stated     Maintain Diabetes [facility]) (pt-stated)        Care Coordination Interventions: Discussed plans with patient for ongoing care management follow up and provided patient with direct contact information for care management team Screening for signs and symptoms of depression related to chronic disease state  Assessed social determinant of health barriers Last HgA1c = 6.2 on 10/26/21 Inquired about any worsening changes, cbg values      manage anxiety and stress symptoms. Protect My Health. Manage Depression symptoms (pt-stated)   Not on track     Timeframe:  Short-Term Goal Priority:  Medium Progress: On Track Start Date:    06/20/21                          Expected End Date:                  12/12/21      Follow Up Date    LCSW is discharging client today from CCM services      Protect My Health (Patient) Manage Anxiety and Stress issues faced . Manage depression symptoms   Why is this important?   Screening tests can find diseases early when they are easier to treat.  Your doctor or nurse will talk with you about which tests are important for you.  Getting shots for common diseases like the flu and shingles will help prevent them.      Patient Self Care Activities:  Self administers medications as prescribed Attends all scheduled provider appointments Performs ADL's independently  Patient Coping Strengths:  Family Friends  Patient Self Care Deficits:  Some pain issues Some mobility issues  Patient Goals:  - spend time or talk with others every day - practice relaxation or meditation daily - keep a calendar with appointment dates  Follow Up Plan: LCSW is discharging client today from CCM SW services. Client  agreed to plan. Client aware of Care Coordination program support      memory, anxiety & knowledge deficits [facility]) (pt-stated)   Not on track     Care Coordination Interventions: Barriers: being unavailable for outreaches Memory and literacy Unsuccessful call attempts to patient x 3 by [facility] RN CM & SW even after scheduled an appointment with her to speak with [facility] SW noted on 01/30/22 Review of chart, noted patient request to speak with office staff, [name] for help Sent secure chat to pcp office staff, [name] to have pcp RN encourage patient during the scheduled 01/31/22 office visit to return a call to the [facility] SW and [facility] RN CM so an attempt to help schedule an office visit with her can be made Sent a message to [facility] SW  Outreached again to Mrs Antos and with some redirection was able to get her to agree to meet with [facility] SW and RN CM today 01/30/22 at 2:30 pm at her pcp office Assessed "nervousness"/anxiety- she confirms she has been anxious "all my life", Inquired about counseling and medical treatments for anxiety Confirmed her DSS case worker last name is "Daphine Deutscher" Confirmed she is willing to be provided assistance,  resources and medical providers to help her cope with her anxiety  as needed. RN CM will check for [employer] medicare in network provider  Assessed for memory changes. Noted more decrease memory of present events vs past events. Time needed to be provided for her to recall past events. Assessed her social support. Discussed the differences in skilled nursing facilities and independent living facilities. Answered questions Encouragement and acknowledgement provided when she discussed her anxiety and illiteracy Discussed with her that her anxiety and literacy are real,  matter and she should not feel judged by others. Encouraged positive self talk Reviewed [id number] request completed on 01/03/22 and spoke with Dr [name] Added low level literacy, memory changes and chronic right shoulder pain  to problem list          Our next appointment is  pending patient agreement at her apartment  on 02/13/22 at 2:00 pm  Please call the care guide team at [phone] if you need to cancel or reschedule your appointment.   If you are experiencing a Mental Health or Behavioral Health Crisis or need someone to talk to, please call the Suicide and Crisis Lifeline: 988 call the [place] National Suicide Prevention Lifeline: 334-223-4192 or TTY: 224 402 0121 TTY 662 287 0452) to talk to a trained counselor call 1-800-273-TALK (toll free, 24 hour hotline) call the [facility]: 513 632 0532 call 911   The patient verbalized understanding of instructions, educational materials, and care plan provided today and DECLINED offer to receive copy of patient instructions, educational materials, and care plan.   The patient has been provided with contact information for the care management team and has been advised to call with any health related questions or concerns.   Asmi Fugere L. Noelle Penner, RN, BSN, CCM [facility] Care Coordinator Office number [phone]

## 2022-01-31 NOTE — Patient Instructions (Signed)
Visit Information  Thank you for taking time to visit with me today. Please don't hesitate to contact me if I can be of assistance to you.   Following are the goals we discussed today:   Goals Addressed               This Visit's Progress     Receive Assistance Arranging In-Home Care Services. (pt-stated)   On track     Care Coordination Interventions:  Reassessed social determinant of health barriers. Active listening/reflection utilized.  Problem solving/task-centered strategies employed. Verbalization of feelings encouraged. Emotional support provided. Caregiver resources discussed. Ineligible for Medicaid, through the [facility], due to income guidelines. ~ Reapply for Medicaid on 02/15/2022, under new Medicaid Expansion guidelines. Ineligible for Personal Care Services, through [facility], due to ability to perform all activities of daily living independently, and not being a Medicaid recipient. Uninterested in placement, of any kind, and unwilling to consider moving into a higher level of care, senior living, or independently living facility. CSW collaboration with Primary Care Provider, Dr. Ivin Booty [name] to request order for neurological consult.  ~ Aware of worsening cognitive and memory deficits.        Our next appointment is by telephone on 02/13/2022 at 12:45 pm.  Please call the care guide team at [phone] if you need to cancel or reschedule your appointment.   If you are experiencing a Mental Health or Behavioral Health Crisis or need someone to talk to, please call the Suicide and Crisis Lifeline: 988 call the [place] National Suicide Prevention Lifeline: (616)315-0028 or TTY: (602)858-6303 TTY 765 742 5868) to talk to a trained counselor call 1-800-273-TALK (toll free, 24 hour hotline) go to [facility] [address] [place] [phone]) call the [facility]  Crisis Line: [phone] call 911  Patient verbalizes understanding of instructions and care plan provided today and agrees to view in MyChart. Active MyChart status and patient understanding of how to access instructions and care plan via MyChart confirmed with patient.     Telephone follow up appointment with care management team member scheduled for:  02/13/2022 at 12:45 pm.  Danford Bad, BSW, MSW, LCSW  Licensed Clinical Social Worker  [facility]  Mailing [place]. [address], [place], [place] [address] Physical Address[address], [place], [place] [address] Toll Free Main # [phone] Fax # [phone] Cell # [phone] [email or web address]

## 2022-01-31 NOTE — Patient Outreach (Signed)
  Care Coordination   In Person Provider Office Visit Note   01/31/2022  Name: Tonya Carpenter MRN: [id number] DOB: 1945-10-12  Tonya Carpenter is a 76 y.o. year old female who sees [name], Elige Radon, MD for primary care. I engaged with Tonya Carpenter in the providers office today.  What matters to the patients health and wellness today?  Receive Assistance Arranging In-Home Care Services.    Goals Addressed               This Visit's Progress     Receive Assistance Arranging In-Home Care Services. (pt-stated)   On track     Care Coordination Interventions:  Reassessed social determinant of health barriers. Active listening/reflection utilized.  Problem solving/task-centered strategies employed. Verbalization of feelings encouraged. Emotional support provided. Caregiver resources discussed. Ineligible for Medicaid, through the [facility], due to income guidelines. ~ Reapply for Medicaid on 02/15/2022, under new Medicaid Expansion guidelines. Ineligible for Personal Care Services, through [facility], due to ability to perform all activities of daily living independently, and not being a Medicaid recipient. Uninterested in placement, of any kind, and unwilling to consider moving into a higher level of care, senior living, or independently living facility. CSW collaboration with Primary Care Provider, Dr. Ivin Booty [name] to request order for neurological consult.  ~ Aware of worsening cognitive and memory deficits.        SDOH assessments and interventions completed:  Yes.  Care Coordination Interventions Activated:  Yes.   Care Coordination Interventions:  Yes, provided.   Follow up plan: Follow up call scheduled for 02/13/2022 at 12:45 pm.  Encounter Outcome:  Pt. Visit Completed.   Danford Bad, BSW, MSW, LCSW  Licensed Restaurant manager, fast food [facility]  Mailing  [place] [address], [place], [place] [address] Physical Address[address] [place], [place] [address] Toll Free Main # [phone] Fax # [phone] Cell # [phone] [email or web address]

## 2022-02-04 ENCOUNTER — Telehealth: Payer: Self-pay | Admitting: *Deleted

## 2022-02-04 NOTE — Patient Instructions (Addendum)
Visit Information  Thank you for taking time to visit with me today. Please don't hesitate to contact me if I can be of assistance to you.   Following are the goals we discussed today:   Goals Addressed               This Visit's Progress     Patient Stated     memory, anxiety, home care resources & knowledge deficits [facility]) (pt-stated)   Not on track     Care Coordination Interventions: Barriers: being unavailable for outreaches Memory and literacy Collaborated with pcp office staff after patient called the office regarding plan of care -Reviewed with patient that presently she does not qualify for Personal care services [facility]) aide related to independence with ADLs, and she does not have Full Medicaid as over qualified per income -Discussed the option of re applying for Medicaid in December 2023 to see if the new  medicaid expansion will help her qualify to get full medicaid that is needed to get CAPs. Confirmed with her that her medicare will not pay for CAPs. -Teach back method used to have her repeat the plan of care -Message to pcp staff, pcp & SW related to plan of care to be re reviewed with patient if she returns a call and forgets related to her memory concerns -Reviewed her pcp office visit note on 01/31/22        Our next appointment is by telephone on 02/13/22 at 2 pm  Please call the care guide team at [phone] if you need to cancel or reschedule your appointment.   If you are experiencing a Mental Health or Behavioral Health Crisis or need someone to talk to, please call the Suicide and Crisis Lifeline: 988 call the [place] National Suicide Prevention Lifeline: 763-613-4933 or TTY: (319)076-2435 TTY 508 646 8808) to talk to a trained counselor call 1-800-273-TALK (toll free, 24 hour hotline) call the [facility]: [phone] call 911   The patient verbalized understanding of instructions, educational materials, and care plan provided today  and DECLINED offer to receive copy of patient instructions, educational materials, and care plan.   The patient has been provided with contact information for the care management team and has been advised to call with any health related questions or concerns.   Hadassah Rana L. Noelle Penner, RN, BSN, CCM [facility] Care Coordinator Office number [phone]

## 2022-02-04 NOTE — Patient Outreach (Signed)
  Care Coordination   Follow Up Visit Note   02/04/2022 Name: Tonya Carpenter MRN: [id number] DOB: 25-Oct-1945  Tonya Carpenter is a 76 y.o. year old female who sees [name], Elige Radon, MD for primary care. I spoke with  Tonya Carpenter by phone today after she placed calls to her primary care provider (PCP) staff, Mitzi today   What matters to the patients health and wellness today?  Community Alternatives Programs (CAP) services  Outreach to [name] to re review the plan for level of care services  She had called pcp office today not remembering the details of the [facility] office visit on last week. She wanted to know if she was getting CAP services Reviewed with her that she did not qualify nor have full [place] medicaid need to get CAP services.  Discussed that on February 15 2022 another medicaid application can be completed to see if the new [place] medicaid expansion would change her qualification.  Had her to repeat with the RN CM that the plan is to submit a new medicaid application on/after February 15 2022. She will need medicaid to get CAP services. She stated she understood but she also confirmed again that she "can't remember well anymore"   It was noted that Dr Louanne Skye re tested her on 01/31/22  " Unspecified dementia, moderate, without behavioral disturbance, psychotic disturbance, mood disturbance, and anxiety (HCC)  She feels like she is becoming more forgetful.  MMSE was 18 out of 30 today, her memory has worsened but also some of that is due to low education and low literacy level."   Goals Addressed               This Visit's Progress     Patient Stated     memory, anxiety, home care resources & knowledge deficits [facility]) (pt-stated)   Not on track     Care Coordination Interventions: Barriers: being unavailable for outreaches Memory and literacy Collaborated with pcp office staff after patient called the office regarding plan of care -Reviewed with patient that presently she does not  qualify for Personal care services [facility]) aide related to independence with ADLs, and she does not have Full Medicaid as over qualified per income -Discussed the option of re applying for Medicaid in December 2023 to see if the new  medicaid expansion will help her qualify to get full medicaid that is needed to get CAPs. Confirmed with her that her medicare will not pay for CAPs. -Teach back method used to have her repeat the plan of care -Message to pcp staff, pcp & SW related to plan of care to be re reviewed with patient if she returns a call and forgets related to her memory concerns -Reviewed her pcp office visit note on 01/31/22        SDOH assessments and interventions completed:  No     Care Coordination Interventions Activated:  Yes  Care Coordination Interventions:  Yes, provided   Follow up plan: Follow up call scheduled for 02/13/22    Encounter Outcome:  Pt. Visit Completed   Chelan Heringer L. Noelle Penner, RN, BSN, CCM [facility] Care Coordinator Office number [phone]

## 2022-02-11 ENCOUNTER — Other Ambulatory Visit: Payer: Self-pay | Admitting: Family Medicine

## 2022-02-11 DIAGNOSIS — Z1231 Encounter for screening mammogram for malignant neoplasm of breast: Secondary | ICD-10-CM

## 2022-02-12 ENCOUNTER — Other Ambulatory Visit: Payer: Self-pay | Admitting: Family Medicine

## 2022-02-13 ENCOUNTER — Ambulatory Visit: Payer: Self-pay | Admitting: *Deleted

## 2022-02-13 ENCOUNTER — Encounter: Payer: Self-pay | Admitting: *Deleted

## 2022-02-13 NOTE — Patient Instructions (Signed)
Visit Information  Thank you for taking time to visit with me today. Please don't hesitate to contact me if I can be of assistance to you.   Following are the goals we discussed today:   Goals Addressed               This Visit's Progress     Receive Assistance Arranging In-Home Care Services. (pt-stated)   On track     Care Coordination Interventions:  Continue to assess determinant of health barriers. Active listening/reflection utilized.  Problem solving/task-centered strategies employed. Verbalization of feelings encouraged. Emotional support provided. Ineligible for Medicaid, through the [facility], due to income guidelines. ~ Reapply for Medicaid on 02/15/2022, under new Medicaid Expansion guidelines. Mailed the following information and application, on 02/13/2022: ~ 2023 Medicaid Tips ~ Medicaid Application ~ How to Apply for Medicaid On-Line Contact CSW directly (# [phone]), when Medicaid Application is received, and CSW will schedule a follow-up appointment in Primary Care Provider, Dr. Ivin Booty [name]'s office, to assist with application completion and submission. CSW collaboration with Primary Care Provider, Dr. Ivin Booty [name], to request order for neurological consult.  ~ Neurological test performed by Primary Care Provider, Dr. Ivin Booty [name], on 01/31/2022. ~ Results of neurological test:  Unspecified Dementia, Moderate, Without Behavioral Disturbance, Psychotic Disturbance, Mood Disturbance, and Anxiety.   ~ Mini Mental Status Exam Score = 18/30           Our next appointment is by telephone on 02/27/2022 at 1:30 pm.  Please call the care guide team at [phone] if you need to cancel or reschedule your appointment.   If you are experiencing a Mental Health or Behavioral Health Crisis or need someone to talk to, please call the Suicide and Crisis Lifeline: 988 call the [place] National Suicide Prevention Lifeline:  5417477792 or TTY: 541-789-7283 TTY (930) 451-5754) to talk to a trained counselor call 1-800-273-TALK (toll free, 24 hour hotline) go to [facility] [address], [place] [phone]) call the [facility] Crisis Line: [phone] call 911  Patient verbalizes understanding of instructions and care plan provided today and agrees to view in MyChart. Active MyChart status and patient understanding of how to access instructions and care plan via MyChart confirmed with patient.     Telephone follow up appointment with care management team member scheduled for:  02/27/2022 at 1:30 pm.  Danford Bad, BSW, MSW, LCSW  Licensed Clinical Social Worker  [facility]  Mailing [place]. [address], [place], [place] [address] Physical Address[address], [place], [place] [address] Toll Free Main # [phone] Fax # [phone] Cell # [phone] [email or web address]

## 2022-02-13 NOTE — Patient Instructions (Addendum)
Visit Information  Thank you for taking time to visit with me today. Please don't hesitate to contact me if I can be of assistance to you.   Following are the goals we discussed today:   Goals Addressed               This Visit's Progress     Patient Stated     Maintain Diabetes [facility]) (pt-stated)   On track     Care Coordination Interventions: Discussed plans with patient for ongoing care management follow up and provided patient with direct contact information for care management team Screening for signs and symptoms of depression related to chronic disease state  Assessed social determinant of health barriers Last HgA1c = 6.2 on 10/26/21 Inquired about any worsening changes, cbg values      memory, anxiety, home care resources & knowledge deficits [facility]) (pt-stated)   Not on track     Care Coordination Interventions: Barriers: being unavailable for outreaches Memory and literacy Review of [facility] SW note, outreach to patient to review with her that she will receive a medicaid application that she needs to watch out for and place in a bag along with any other applications in the next week  in preparation for a visit with [facility] RN CM & SW to complete -allowed patient to ventilate her feelings, Frequent redirection back to the subject of her medicaid application and pending visit -Teach back method used to have her repeat the plan of care- her one goal is to get all her mail together in one place (bag) within the next week Outreach to [employer] with inquiry about treatment for anxiety/memory         Our next appointment is by telephone on 02/20/22 at 2 pm  Please call the care guide team at [phone] if you need to cancel or reschedule your appointment.   If you are experiencing a Mental Health or Behavioral Health Crisis or need someone to talk to, please call the Suicide and Crisis Lifeline: 988 call the [place] National Suicide Prevention Lifeline: 2313503986 or TTY: 563 392 8118  TTY 9852620039) to talk to a trained counselor call 1-800-273-TALK (toll free, 24 hour hotline) call the [facility]: ([phone] call 911   The patient verbalized understanding of instructions, educational materials, and care plan provided today and DECLINED offer to receive copy of patient instructions, educational materials, and care plan.   The patient has been provided with contact information for the care management team and has been advised to call with any health related questions or concerns.   Brileigh Sevcik L. Noelle Penner, RN, BSN, CCM [facility] Care Coordinator Office number [phone]

## 2022-02-13 NOTE — Patient Outreach (Signed)
  Care Coordination   Follow Up Visit Note   02/13/2022  Name: Tonya Carpenter MRN: [id number] DOB: May 20, 1945  Tonya Carpenter Gr[place] is a 76 y.o. year old female who sees [name], Elige Radon, MD for primary care. I spoke with Tonya Carpenter People by phone today.  What matters to the patients health and wellness today?   Receive Assistance Arranging In-Home Care Services.   Goals Addressed               This Visit's Progress     Receive Assistance Arranging In-Home Care Services. (pt-stated)   On track     Care Coordination Interventions:  Continue to assess determinant of health barriers. Active listening/reflection utilized.  Problem solving/task-centered strategies employed. Verbalization of feelings encouraged. Emotional support provided. Ineligible for Medicaid, through the [facility], due to income guidelines. ~ Reapply for Medicaid on 02/15/2022, under new Medicaid Expansion guidelines. Mailed the following information and application, on 02/13/2022: ~ 2023 Medicaid Tips ~ Medicaid Application ~ How to Apply for Medicaid On-Line Contact CSW directly (# [phone]), when Medicaid Application is received, and CSW will schedule a follow-up appointment in Primary Care Provider, Dr. Ivin Booty [name]'s office, to assist with application completion and submission. CSW collaboration with Primary Care Provider, Dr. Ivin Booty [name], to request order for neurological consult.  ~ Neurological test performed by Primary Care Provider, Dr. Ivin Booty [name], on 01/31/2022. ~ Results of neurological test:  Unspecified Dementia, Moderate, Without Behavioral Disturbance, Psychotic Disturbance, Mood Disturbance, and Anxiety.   ~ Mini Mental Status Exam Score = 18/30           SDOH assessments and interventions completed:  Yes.  Care Coordination Interventions:  Yes, provided.   Follow up plan: Follow up call scheduled for 02/27/2022 at 1:30 pm.  Encounter  Outcome:  Pt. Visit Completed.   Danford Bad, BSW, MSW, LCSW  Licensed Restaurant manager, fast food [facility]  Mailing [place] [address] [place], [place] [address] Physical Address[address], [place], [place] [address] Toll Free Main # [phone] Fax # [phone] Cell # [phone] [email or web address]

## 2022-02-13 NOTE — Patient Outreach (Signed)
  Care Coordination   Follow Up Visit Note   02/13/2022 Name: FARRA NIKOLIC MRN: [id number] DOB: Jan 14, 1946  Garry Heater Converse is a 76 y.o. year old female who sees [name], Elige Radon, MD for primary care. I spoke with  Garry Heater Ferrick by phone today.  What matters to the patients health and wellness today?  "Good days and bad days" Reports she has different letters that she has received and is waiting on to get help completing  She discussed a [employer] check  - She paid $54 on her last light bill in October 2023 but is wondering if she will have to continue to pay She confirms an outreach from [facility] and [facility] and pending arrival to assist with possible new medicaid status  Frequently interrupting RN CM and informing RN CM that she is "real nervous", " unable to remember"  Allowed her to ventilate her feelings  Reviewed x 3 Mrs Jakes's goal to gather all her mail in the the coming week in one location (bag) to get prepared for a visit either at her home or pcp office with [facility] RN CM & SW to complete all forms/applications for her  She voiced understanding that RN CM will outreach to her weekly to confirm she has received mailed items prior to scheduling a visit   Goals Addressed               This Visit's Progress     Patient Stated     Maintain Diabetes [facility]) (pt-stated)   On track     Care Coordination Interventions: Discussed plans with patient for ongoing care management follow up and provided patient with direct contact information for care management team Screening for signs and symptoms of depression related to chronic disease state  Assessed social determinant of health barriers Last HgA1c = 6.2 on 10/26/21 Inquired about any worsening changes, cbg values      memory, anxiety, home care resources & knowledge deficits [facility]) (pt-stated)   Not on track     Care Coordination Interventions: Barriers: being unavailable for outreaches Memory and literacy Review  of [facility] SW note, outreach to patient to review with her that she will receive a medicaid application that she needs to watch out for and place in a bag along with any other applications in the next week  in preparation for a visit with [facility] RN CM & SW to complete -allowed patient to ventilate her feelings, Frequent redirection back to the subject of her medicaid application and pending visit -Teach back method used to have her repeat the plan of care- her one goal is to get all her mail together in one place (bag) within the next week Outreach to [employer] with inquiry about treatment for anxiety/memory         SDOH assessments and interventions completed:  Yes     Care Coordination Interventions:  Yes, provided   Follow up plan: Follow up call scheduled for 02/20/22    Encounter Outcome:  Pt. Visit Completed   Mohsen Odenthal L. Noelle Penner, RN, BSN, CCM [facility] Care Coordinator Office number [phone]

## 2022-02-17 ENCOUNTER — Other Ambulatory Visit: Payer: Self-pay | Admitting: Family Medicine

## 2022-02-17 DIAGNOSIS — E1169 Type 2 diabetes mellitus with other specified complication: Secondary | ICD-10-CM

## 2022-02-20 ENCOUNTER — Ambulatory Visit: Payer: Self-pay | Admitting: *Deleted

## 2022-02-20 ENCOUNTER — Encounter: Payer: Self-pay | Admitting: *Deleted

## 2022-02-20 NOTE — Patient Outreach (Signed)
  Care Coordination   Follow Up Visit Note   02/20/2022 Name: ARORA COAKLEY MRN: [id number] DOB: September 26, 1945  Garry Heater Linder is a 76 y.o. year old female who sees [name], Elige Radon, MD for primary care. I spoke with  Garry Heater Eschbach by phone today.  What matters to the patients health and wellness today?  She received the medicaid + forms today 02/20/22 as she was able to read to RN CM the [facility] SW name on the letter  She has received he 2023 medicaid tips, medicaid application, & the how to apply for medicaid on line information   She reports when she received the forms she went to the pcp office today looking for [facility] staff vs calling [facility] staff as requested. RN CM informed her this is why RN CM discussed with her that RN CM would call her today.  She informs with RN CM that she continues to memory as she reports not recalling She & RN CM set a goal for her to collect all her mail and place it in a bag, then RN CM would call her to confirmed she has the forms before scheduling a visit to her to complete the forms  Today RN CM discussed plans to meet her per her preference and encouraged her to gather any bills, bank statements, tax forms to help with her medicaid process  "I don't know everything" She reports when her husband was living she would go with him to DSS or to [employer] to get help filling out medicaid forms   Goals Addressed               This Visit's Progress     Patient Stated     memory, anxiety, home care resources & knowledge deficits [facility]) (pt-stated)   On track     Care Coordination Interventions: Barriers: being unavailable for outreaches Memory and literacy Review of [facility] SW note, outreach to patient to confirm she has received letter today with a medicaid application   Discussed RN CM consulting with [facility] SW for a future visit to complete forms -allowed patient to ventilate her feelings, Frequent redirection back to the subject of her medicaid application and pending  visit Active listening, acknowledged her concerns about her memory & knowledge, support offered Discussed [facility] staff present availability for pcp office visit Choices offered related to the location of the next visit, the day etc- She ventilated but did not give a definitive answer  -Teach back method used to have her repeat the plan of care- her goal is to locate other financial documents in her apartment and place them in the bag with the letter from [facility] SW and await a call for the scheduled visit with her to complete the forms Repeated the plan over 5 times. Outreach to [facility] SW to update her that patient received the forms today         SDOH assessments and interventions completed:  No     Care Coordination Interventions:  Yes, provided   Follow up plan: Follow up call scheduled for 02/27/22    Encounter Outcome:  Pt. Visit Completed   Natara Monfort L. Noelle Penner, RN, BSN, CCM [facility] Care Coordinator Office number [phone]

## 2022-02-20 NOTE — Patient Instructions (Addendum)
Visit Information  Thank you for taking time to visit with me today. Please don't hesitate to contact me if I can be of assistance to you.   Following are the goals we discussed today:   Goals Addressed               This Visit's Progress     Patient Stated     memory, anxiety, home care resources & knowledge deficits [facility]) (pt-stated)   On track     Care Coordination Interventions: Barriers: being unavailable for outreaches Memory and literacy Review of [facility] SW note, outreach to patient to confirm she has received letter today with a medicaid application   Discussed RN CM consulting with [facility] SW for a future visit to complete forms -allowed patient to ventilate her feelings, Frequent redirection back to the subject of her medicaid application and pending visit Active listening, acknowledged her concerns about her memory & knowledge, support offered Discussed [facility] staff present availability for pcp office visit Choices offered related to the location of the next visit, the day etc- She ventilated but did not give a definitive answer  -Teach back method used to have her repeat the plan of care- her goal is to locate other financial documents in her apartment and place them in the bag with the letter from [facility] SW and await a call for the scheduled visit with her to complete the forms Repeated the plan over 5 times. Outreach to [facility] SW to update her that patient received the forms today         Our next appointment is in-person at @PCP @'s office on 02/27/22 at 2:30  Please call the care guide team at [phone] if you need to cancel or reschedule your appointment.   If you are experiencing a Mental Health or Behavioral Health Crisis or need someone to talk to, please call the Suicide and Crisis Lifeline: 988 call the 03-24-1986 National Suicide Prevention Lifeline: 340-079-5360 or TTY: 603-606-2152 TTY 626 781 7100) to talk to a trained counselor call 1-800-273-TALK (toll free, 24 hour  hotline) call the [facility]: 929-133-1218 call 911   The patient verbalized understanding of instructions, educational materials, and care plan provided today and DECLINED offer to receive copy of patient instructions, educational materials, and care plan.   The patient has been provided with contact information for the care management team and has been advised to call with any health related questions or concerns.   Damontae Loppnow L. [phone], RN, BSN, CCM [facility] Care Coordinator Office number [phone]

## 2022-02-27 ENCOUNTER — Ambulatory Visit: Payer: Self-pay | Admitting: *Deleted

## 2022-02-27 NOTE — Patient Outreach (Signed)
  Care Coordination   02/27/2022  Name: Tonya Carpenter MRN: [id number] DOB: 1945/03/26   Care Coordination Outreach Attempts:  An unsuccessful telephone outreach was attempted today to offer the patient information about available care coordination services as a benefit of their health plan. HIPAA compliant messages left on voicemail, providing contact information for CSW, encouraging patient to return CSW's call at her earliest convenience.  Follow Up Plan:  Additional outreach attempts will be made to offer the patient care coordination information and services.   Encounter Outcome:  No Answer.   Care Coordination Interventions:  No, not indicated.    Danford Bad, BSW, MSW, LCSW  Licensed Restaurant manager, fast food [facility]  Mailing [place] [address] [place], [place] [address] Physical Address-[address] [place], [place] [address] Toll Free Main # [phone] Fax # [phone] Cell # [phone] [name]

## 2022-02-27 NOTE — Patient Outreach (Signed)
  Care Coordination   02/27/2022 Name: Tonya Carpenter MRN: [id number] DOB: [birth year]   Care Coordination Outreach Attempts:  An unsuccessful telephone outreach was attempted today to offer the patient information about available care coordination services as a benefit of their health plan.   Follow Up Plan:  Additional outreach attempts will be made to offer the patient care coordination information and services.   Encounter Outcome:  No Answer   Care Coordination Interventions:  No, not indicated     Rion Schnitzer L. Noelle Penner, RN, BSN, CCM [facility] Care Coordinator Office number [phone]

## 2022-03-01 ENCOUNTER — Telehealth: Payer: Self-pay | Admitting: Family Medicine

## 2022-03-01 NOTE — Telephone Encounter (Signed)
Pt made aware of [name]'s recommendations. She will call pharmacy to verify.

## 2022-03-01 NOTE — Telephone Encounter (Signed)
As long as she double checks with the pharmacist and verifies that it is the same medicine, he has sometimes the pill shaped skin change size and that is perfectly normal.  It just made by different company.  If she wants to bring it in and show it to [place] we can always double check and verify.

## 2022-03-01 NOTE — Telephone Encounter (Signed)
Pt states the medication looks different than last time states it smaller but wanted to make sure with you that it is ok for her to take. She states she doesn't want to hear from someone else mouth, wether its a pharmacist, if its a nurse, want to know if its ok only from her doctor.

## 2022-03-01 NOTE — Telephone Encounter (Signed)
Pt want to know if she should be taking hydralazine 10MG . Please call back

## 2022-03-04 ENCOUNTER — Encounter: Payer: Self-pay | Admitting: *Deleted

## 2022-03-04 ENCOUNTER — Ambulatory Visit: Payer: Self-pay | Admitting: *Deleted

## 2022-03-04 NOTE — Patient Outreach (Signed)
  Care Coordination   Follow Up Visit Note   03/05/2022 Name: Tonya Carpenter MRN: [id number] DOB: [birth year]  Tonya Carpenter is a 76 y.o. year old female who sees [name], Elige Radon, MD for primary care. I spoke with  Tonya Carpenter by phone today on her home number  What matters to the patients health and wellness today?  Medicaid application  Patient answered her home phone after the second call  Tonya Carpenter informs RN CM that she spoke with a woman that helps her and "no I don't want to do it.  I don't want to fool with it" She states she did not qualify previously but does not know if she will this time even after RN CM discussed the new [place] Medicaid expansion that started on February 15 2022  She informed RN CM she would speak with the lady that helps her and she may or may not show up on Wednesday 03/06/22 to her pcp office    Goals Addressed               This Visit's Progress     Patient Stated     memory, anxiety, home care resources & knowledge deficits [facility]) (pt-stated)   Not on track     Care Coordination Interventions: Barriers: being unavailable for outreaches Memory and literacy Called patient twice on her mobile number and left messages and twice on her home number before she answered her home phone Discussed having her to arrive at the pcp office to get her medicaid forms completed on Wednesday 03/06/22 Discussed again the new [place] Medicaid expansion that started on 02/15/22 that may help her to qualify for medicaid and receive services that would help her to get the help she reports she stated she wanted in her home allowed patient to ventilate her feelings Active listening, acknowledged her concerns Discussed [facility] staff present availability for pcp office visit        SDOH assessments and interventions completed:  No     Care Coordination Interventions:  Yes, provided   Follow up plan: Follow up call scheduled for 03/06/22    Encounter Outcome:  Pt. Visit Completed    Tonya Carpenter L. Noelle Penner, RN, BSN, CCM [facility] Care Coordinator Office number [phone]

## 2022-03-04 NOTE — Patient Outreach (Signed)
  Care Coordination   03/04/2022 Name: Tonya Carpenter MRN: [id number] DOB: 1946-02-18   Care Coordination Outreach Attempts:  An unsuccessful telephone outreach was attempted today to offer the patient information about available care coordination services as a benefit of their health plan.  No answer at home number Left message at mobile number inquiring about appointment on 03/06/22 Left RN CM & [facility] SW numbers   Follow Up Plan:  Additional outreach attempts will be made to offer the patient care coordination information and services.   Encounter Outcome:  No Answer   Care Coordination Interventions:  No, not indicated    Angelita Harnack L. Noelle Penner, RN, BSN, CCM [facility] Care Coordinator Office number [phone]

## 2022-03-04 NOTE — Patient Outreach (Signed)
  Care Coordination   03/04/2022 Name: Tonya Carpenter MRN: [id number] DOB: 08/06/1945   Care Coordination Outreach Attempts:  An unsuccessful telephone outreach was attempted today to offer the patient information about available care coordination services as a benefit of their health plan.   Follow Up Plan:  Additional outreach attempts will be made to offer the patient care coordination information and services.   Encounter Outcome:  No Answer  of her mobile number Message left with [facility] SSW & RN CM numbers   Care Coordination Interventions:  No, not indicated    Quang Thorpe L. Noelle Penner, RN, BSN, CCM [facility] Care Coordinator Office number [phone]

## 2022-03-05 NOTE — Patient Instructions (Signed)
Visit Information  Thank you for taking time to visit with me today. Please don't hesitate to contact me if I can be of assistance to you.   Following are the goals we discussed today:   Goals Addressed               This Visit's Progress     Patient Stated     memory, anxiety, home care resources & knowledge deficits [facility]) (pt-stated)   Not on track     Care Coordination Interventions: Barriers: being unavailable for outreaches Memory and literacy Called patient twice on her mobile number and left messages and twice on her home number before she answered her home phone Discussed having her to arrive at the pcp office to get her medicaid forms completed on Wednesday 03/06/22 Discussed again the new [place] Medicaid expansion that started on 02/15/22 that may help her to qualify for medicaid and receive services that would help her to get the help she reports she stated she wanted in her home allowed patient to ventilate her feelings Active listening, acknowledged her concerns Discussed [facility] staff present availability for pcp office visit        Our next appointment is by telephone on 03/06/22 at 1:45 pm  Please call the care guide team at [phone] if you need to cancel or reschedule your appointment.   If you are experiencing a Mental Health or Behavioral Health Crisis or need someone to talk to, please call the Suicide and Crisis Lifeline: 988 call the [place] National Suicide Prevention Lifeline: (818)297-0175 or TTY: 684-359-3883 TTY 703-194-0784) to talk to a trained counselor call 1-800-273-TALK (toll free, 24 hour hotline) call the [facility]: [phone] call 911   The patient verbalized understanding of instructions, educational materials, and care plan provided today and DECLINED offer to receive copy of patient instructions, educational materials, and care plan.   Attempted to offer the patient the contact numbers for the RN CM & SW when she  reported she was not able to check her voice messages but she concluded the call after informing the RN CM she was in the bathroom  [employer] L. Noelle Penner, RN, BSN, CCM [facility] Care Coordinator Office number [phone]

## 2022-03-06 ENCOUNTER — Ambulatory Visit: Payer: Self-pay | Admitting: *Deleted

## 2022-03-06 ENCOUNTER — Encounter: Payer: Self-pay | Admitting: *Deleted

## 2022-03-06 NOTE — Patient Instructions (Signed)
Visit Information  Thank you for taking time to visit with me today. Please don't hesitate to contact me if I can be of assistance to you.   Following are the goals we discussed today:   Goals Addressed               This Visit's Progress     COMPLETED: Receive Assistance Arranging In-Home Care Services. (pt-stated)   Not on track     Care Coordination Interventions:  Continue to assess determinant of health barriers. Active listening/reflection utilized.  Problem solving interventions discussed. Task-centered strategies reviewed. Verbalization of feelings encouraged. Emotional support provided. Feelings of frustration validated. Brief Cognitive Behavioral Therapy performed. No longer interested in re-applying for Medicaid, through the [facility].   No longer interested in re-applying for [facility]) Personal Care Services, through [facility] [phone]). No longer interested in re-applying for [employer] manager), through the [facility] Department of Health & [employer] (# [phone]). Please contact CSW directly (# [phone]), if you change your mind about wanting to receive social work services, or if additional social work needs are identified in the near future.          Please call the care guide team at [phone] if you need to cancel or reschedule your appointment.   If you are experiencing a Mental Health or Behavioral Health Crisis or need someone to talk to, please call the Suicide and Crisis Lifeline: 988 call the [place] National Suicide Prevention Lifeline: (239)500-3844 or TTY: 223-545-8278 TTY 848-382-2620) to talk to a trained counselor call 1-800-273-TALK (toll free, 24 hour hotline) go to [facility] [address] [place] [phone]) call the [facility] Crisis Line: (873)336-5617 call 911  Patient verbalizes  understanding of instructions and care plan provided today and agrees to view in MyChart. Active MyChart status and patient understanding of how to access instructions and care plan via MyChart confirmed with patient.     No further follow up required.  Danford Bad, BSW, MSW, LCSW  Licensed Restaurant manager, fast food [facility]  Mailing [place] [address] [place], [place] [address] Physical Address-[address], [place], [place] [address] Toll Free Main # [phone] Fax # [phone] Cell # [phone] [email or web address]

## 2022-03-06 NOTE — Patient Outreach (Signed)
  Care Coordination   Follow Up Visit Note   Late entry for 03/06/22 03/14/2022 Name: Tonya Carpenter MRN: [id number] DOB: 11/02/1945  Tonya Carpenter is a 76 y.o. year old female who sees [name], Elige Radon, MD for primary care. I spoke with  Tonya Carpenter by phone today.  What matters to the patients health and wellness today?  Medicaid application Voiced appreciation for RN CM voicing understanding of her not wanting to complete medicaid application and offering for her to updated RN CM when and if she decides to apply Muscle cramps- Lipitor Identifying change in newly delivered hydralazine She thanked RN CM and request that RN CM follow back up with her for medical needs    Goals Addressed               This Visit's Progress     Patient Stated     Manage symptoms of cramps ([facility]) (pt-stated)   Not on track     Care Coordination Interventions: Evaluation of current treatment plan related to hypertension self management and patient's adherence to plan as established by provider Reviewed medications with patient and discussed importance of compliance Discussed plans with patient for ongoing care management follow up and provided patient with direct contact information for care management team Discussed side effects of atorvastan, low potassium  Discussed her use of mustard for cramps Identified and reviewed her hydralazine color shape and purpose        SDOH assessments and interventions completed:  No     Care Coordination Interventions:  Yes, provided   Follow up plan: Follow up call scheduled for 04/06/22    Encounter Outcome:  Pt. Visit Completed   Carolena Fairbank L. Noelle Penner, RN, BSN, CCM [facility] Care Coordinator Office number [phone]

## 2022-03-06 NOTE — Patient Outreach (Signed)
  Care Coordination   Follow Up Visit Note   03/06/2022  Name: Tonya Carpenter MRN: [id number] DOB: 1945/08/31  Tonya Carpenter Alles is a 76 y.o. year old female who sees [name], Elige Radon, MD for primary care. I spoke with Tonya Carpenter Tonya Carpenter by phone today.  What matters to the patients health and wellness today?  Receive Assistance Arranging In-Home Care Services.    Goals Addressed               This Visit's Progress     COMPLETED: Receive Assistance Arranging In-Home Care Services. (pt-stated)   Not on track     Care Coordination Interventions:  Continue to assess determinant of health barriers. Active listening/reflection utilized.  Problem solving interventions discussed. Task-centered strategies reviewed. Verbalization of feelings encouraged. Emotional support provided. Feelings of frustration validated. Brief Cognitive Behavioral Therapy performed. No longer interested in re-applying for Medicaid, through the [facility].   No longer interested in re-applying for [facility]) Personal Care Services, through [facility] [phone]). No longer interested in re-applying for [employer] manager), through the [facility] Department of Health & [employer] (# [phone]). Please contact CSW directly (# [phone]), if you change your mind about wanting to receive social work services, or if additional social work needs are identified in the near future.          SDOH assessments and interventions completed:  Yes.  Care Coordination Interventions:  Yes, provided.   Follow up plan: No further intervention required.   Encounter Outcome:  Pt. Visit Completed.   Danford Bad, BSW, MSW, LCSW  Licensed Restaurant manager, [employer] [facility]  M[address] [place], [place] [address] Physical Address-[address] [place], [place] [address] Toll  Free Main # [phone] Fax # [phone] Cell # [phone] [email or web address]

## 2022-03-07 ENCOUNTER — Other Ambulatory Visit: Payer: Self-pay | Admitting: Family Medicine

## 2022-03-07 DIAGNOSIS — N183 Chronic kidney disease, stage 3 unspecified: Secondary | ICD-10-CM

## 2022-03-14 NOTE — Patient Instructions (Signed)
Visit Information  Thank you for taking time to visit with me today. Please don't hesitate to contact me if I can be of assistance to you.   Following are the goals we discussed today:   Goals Addressed               This Visit's Progress     Patient Stated     Manage symptoms of cramps (THN) (pt-stated)   Not on track     Care Coordination Interventions: Evaluation of current treatment plan related to hypertension self management and patient's adherence to plan as established by provider Reviewed medications with patient and discussed importance of compliance Discussed plans with patient for ongoing care management follow up and provided patient with direct contact information for care management team Discussed side effects of atorvastan, low potassium  Discussed her use of mustard for cramps Identified and reviewed her hydralazine color shape and purpose        Our next appointment is by telephone on 04/06/22 at 1 pm  Please call the care guide team at [phone] if you need to cancel or reschedule your appointment.   If you are experiencing a Mental Health or Behavioral Health Crisis or need someone to talk to, please call the Suicide and Crisis Lifeline: 988 call the [place] National Suicide Prevention Lifeline: 6055682716 or TTY: (208)554-1156 TTY 463-689-0474) to talk to a trained counselor call 1-800-273-TALK (toll free, 24 hour hotline) call the [facility]: 503-340-3127 call 911   The patient verbalized understanding of instructions, educational materials, and care plan provided today and DECLINED offer to receive copy of patient instructions, educational materials, and care plan.   The patient has been provided with contact information for the care management team and has been advised to call with any health related questions or concerns.    Carolin Quang L. Noelle Penner, RN, BSN, CCM [facility] Care Coordinator Office number [phone]

## 2022-04-01 ENCOUNTER — Other Ambulatory Visit: Payer: Self-pay | Admitting: *Deleted

## 2022-04-01 MED ORDER — ASPIRIN 81 MG PO TBEC: DELAYED_RELEASE_TABLET | ORAL | 2 refills | Status: DC

## 2022-04-03 ENCOUNTER — Ambulatory Visit: Payer: Medicare HMO

## 2022-04-08 ENCOUNTER — Encounter: Payer: Medicare HMO | Admitting: *Deleted

## 2022-04-11 ENCOUNTER — Ambulatory Visit: Payer: Medicare HMO

## 2022-04-22 ENCOUNTER — Encounter: Payer: Medicare HMO | Admitting: *Deleted

## 2022-04-23 ENCOUNTER — Ambulatory Visit: Payer: Self-pay | Admitting: *Deleted

## 2022-04-23 ENCOUNTER — Encounter: Payer: Self-pay | Admitting: *Deleted

## 2022-04-23 NOTE — Patient Instructions (Addendum)
Visit Information  Thank you for taking time to visit with me today. Please don't hesitate to contact me if I can be of assistance to you.   Following are the goals we discussed today:   Goals Addressed               This Visit's Progress     Patient Stated     COMPLETED: Client wants to talk with LCSW about mannaging streess issues faced (pt-stated)          Change of [facility]N SW in July 6378 Duplicate goal-closed by Learta Codding, RN CM 04/23/22  Current Barriers:  Financial challenges in client with Chronic Diagnoses of GERD, CAD, Type 2 DM, HLD, HTN Transport challenge  Clinical Social Work Clinical Goal(s):  Client to talk with LCSW in next 30 days about client managing stress issues  Interventions: Talked with client about CCM services Talked with client about support with RNCM for nursing needs Talked about Food Stamps benefit Talked about [place] support  Patient Self Care Activities:  Takes medications as prescribed Attends scheduled medical appointments  Plan: Client to attend medical appointments LCSW to call client in next 3 weeks to discuss psychosocial needs of client Client to talk with [facility] about resources      Initial goal documentation       Maintain Diabetes [facility]) (pt-stated)   On track     Care Coordination Interventions: Discussed plans with patient for ongoing care management follow up and provided patient with direct contact information for care management team Assessed social determinant of health barriers Last HgA1c = 6.8 on 01/31/22 up from 6.2 on 10/26/21 Inquired about any worsening changes, cbg values      COMPLETED: manage anxiety and stress symptoms. Protect My Health. Manage Depression symptoms (pt-stated)        Change in [facility] SW staff in July 5885 Duplicate goal Closed by Learta Codding 04/23/22    Timeframe:  Short-Term Goal Priority:  Medium Progress: On Track Start Date:    06/20/21                           Expected End Date:                  12/12/21      Follow Up Date    LCSW is discharging client today from [place] [facility] (Patient) Manage Anxiety and Stress issues faced . Manage depression symptoms   Why is this important?   Screening tests can find diseases early when they are easier to treat.  Your doctor or nurse will talk with you about which tests are important for you.  Getting shots for common diseases like the flu and shingles will help prevent them.      Patient Self Care Activities:  Self administers medications as prescribed Attends all scheduled provider appointments Performs ADL's independently  Patient Coping Strengths:  Family Friends  Patient Self Care Deficits:  Some pain issues Some mobility issues  Patient Goals:  - spend time or talk with others every day - practice relaxation or meditation daily - keep a calendar with appointment dates  Follow Up Plan: LCSW is discharging client today from [place]. Client agreed to plan. Client aware of Care Coordination program support      COMPLETED: Manage My Emotions (pt-stated)        Manage symptoms  of cramps (THN) (pt-stated)   On track     Care Coordination Interventions: Evaluation of current treatment plan related to hypertension self management and patient's adherence to plan as established by provider Discussed plans with patient for ongoing care management follow up and provided patient with direct contact information for care management team      memory, anxiety, home care resources & knowledge deficits [facility]) (pt-stated)   On track     Care Coordination Interventions: Barriers: being unavailable for outreaches. Denying need of assistance, avoidance,  Memory and literacy, knowledge deficits Called patient on her mobile number unable to leave messages as voice mail box is full Reached her on her home number  Assessed for any immediate needs Noted anxiety with fast speech and  not answering assessment questions allowed patient to ventilate her feelings Active listening, acknowledged her concerns Offered to return a call to patient at a later time         Our next appointment is in-person at @PCP @'s office on 05/09/22 at 1015  Please call the care guide team at [phone] if you need to cancel or reschedule your appointment.   If you are experiencing a Mental Health or [place] or need someone to talk to, please call the Suicide and Crisis Lifeline: 988 call the [place] National Suicide Prevention Lifeline: (778)136-4878 or TTY: 450 385 0389 TTY (213)004-6924) to talk to a trained counselor call 1-800-273-TALK (toll free, 24 hour hotline) call the [facility]: [phone] call 911   The patient verbalized understanding of instructions, educational materials, and care plan provided today and DECLINED offer to receive copy of patient instructions, educational materials, and care plan.   The patient has been provided with contact information for the care management team and has been advised to call with any health related questions or concerns.   Kaylina Cahue L. Lavina Hamman, RN, BSN, [place] Coordinator Office number [phone]

## 2022-04-23 NOTE — Patient Outreach (Signed)
Care Coordination   Follow Up Visit Note   04/23/2022 Name: TABATHA RAZZANO MRN: [id number] DOB: November 21, 1945  Kristine Royal Mesquita is a 77 y.o. year old female who sees [name], Fransisca Kaufmann, MD for primary care. I spoke with  Kristine Royal Godino by phone today.  What matters to the patients health and wellness today?  Doing well  Going to [employer] to get my phone card updated" "get my money right" Denies needs or concerns today  (diabetes, cramps)   Goals Addressed               This Visit's Progress     Patient Stated     COMPLETED: Client wants to talk with LCSW about mannaging streess issues faced (pt-stated)          Change of [facility]N SW in July 6803 Duplicate goal-closed by Learta Codding, RN CM 04/23/22  Current Barriers:  Financial challenges in client with Chronic Diagnoses of GERD, CAD, Type 2 DM, HLD, HTN Transport challenge  Clinical Social Work Clinical Goal(s):  Client to talk with LCSW in next 30 days about client managing stress issues  Interventions: Talked with client about CCM services Talked with client about support with RNCM for nursing needs Talked about Food Stamps benefit Talked about [place] support  Patient Self Care Activities:  Takes medications as prescribed Attends scheduled medical appointments  Plan: Client to attend medical appointments LCSW to call client in next 3 weeks to discuss psychosocial needs of client Client to talk with [facility] about resources      Initial goal documentation       Maintain Diabetes [facility]) (pt-stated)   On track     Care Coordination Interventions: Discussed plans with patient for ongoing care management follow up and provided patient with direct contact information for care management team Assessed social determinant of health barriers Last HgA1c = 6.8 on 01/31/22 up from 6.2 on 10/26/21 Inquired about any worsening changes, cbg values      COMPLETED: manage anxiety and stress symptoms. Protect My  Health. Manage Depression symptoms (pt-stated)        Change in [facility] SW staff in July 2122 Duplicate goal Closed by Learta Codding 04/23/22    Timeframe:  Short-Term Goal Priority:  Medium Progress: On Track Start Date:    06/20/21                          Expected End Date:                  12/12/21      Follow Up Date    LCSW is discharging client today from [place] [facility] (Patient) Manage Anxiety and Stress issues faced . Manage depression symptoms   Why is this important?   Screening tests can find diseases early when they are easier to treat.  Your doctor or nurse will talk with you about which tests are important for you.  Getting shots for common diseases like the flu and shingles will help prevent them.      Patient Self Care Activities:  Self administers medications as prescribed Attends all scheduled provider appointments Performs ADL's independently  Patient Coping Strengths:  Family Friends  Patient Self Care Deficits:  Some pain issues Some mobility issues  Patient Goals:  - spend time or talk with others every day - practice relaxation or meditation daily -  keep a calendar with appointment dates  Follow Up Plan: LCSW is discharging client today from [place]. Client agreed to plan. Client aware of Care Coordination program support      COMPLETED: Manage My Emotions (pt-stated)        Manage symptoms of cramps (THN) (pt-stated)   On track     Care Coordination Interventions: Evaluation of current treatment plan related to hypertension self management and patient's adherence to plan as established by provider Discussed plans with patient for ongoing care management follow up and provided patient with direct contact information for care management team      memory, anxiety, home care resources & knowledge deficits [facility]) (pt-stated)   On track     Care Coordination Interventions: Barriers: being unavailable for outreaches. Denying need of  assistance, avoidance,  Memory and literacy, knowledge deficits Called patient on her mobile number unable to leave messages as voice mail box is full Reached her on her home number  Assessed for any immediate needs Noted anxiety with fast speech and not answering assessment questions allowed patient to ventilate her feelings Active listening, acknowledged her concerns Offered to return a call to patient at a later time         SDOH assessments and interventions completed:  Yes  SDOH Interventions Today    Flowsheet Row Most Recent Value  SDOH Interventions   Food Insecurity Interventions Intervention Not Indicated  Housing Interventions Intervention Not Indicated  Transportation Interventions Intervention Not Indicated  Stress Interventions Intervention Not Indicated        Care Coordination Interventions:  No, not indicated   Interventions Today    Flowsheet Row Most Recent Value  Chronic Disease Discussed/Reviewed   Chronic disease discussed/reviewed during today's visit Diabetes  General Interventions   General Interventions Discussed/Reviewed General Interventions Reviewed        Follow up plan: Follow up call scheduled for 05/09/22    Encounter Outcome:  Pt. Visit Completed   Adelena Desantiago L. Lavina Hamman, RN, BSN, [place] Coordinator Office number [phone]

## 2022-05-06 ENCOUNTER — Telehealth: Payer: Self-pay | Admitting: Family Medicine

## 2022-05-06 NOTE — Telephone Encounter (Addendum)
We do not have Calcium on her current medication list. Her calcium level has been normal. If she agrees she cannot hold off on taking and we can re check at her next visit.   Tried calling pt. No answer, no machine on either number

## 2022-05-06 NOTE — Telephone Encounter (Signed)
Pt made aware and voiced understanding.

## 2022-05-09 ENCOUNTER — Encounter: Payer: Self-pay | Admitting: Family Medicine

## 2022-05-09 ENCOUNTER — Ambulatory Visit (INDEPENDENT_AMBULATORY_CARE_PROVIDER_SITE_OTHER): Payer: Medicare HMO | Admitting: Family Medicine

## 2022-05-09 ENCOUNTER — Ambulatory Visit: Payer: Self-pay | Admitting: *Deleted

## 2022-05-09 VITALS — BP 146/74 | HR 65 | Ht 63.0 in | Wt 152.0 lb

## 2022-05-09 DIAGNOSIS — N183 Chronic kidney disease, stage 3 unspecified: Secondary | ICD-10-CM

## 2022-05-09 DIAGNOSIS — E1169 Type 2 diabetes mellitus with other specified complication: Secondary | ICD-10-CM

## 2022-05-09 DIAGNOSIS — I2510 Atherosclerotic heart disease of native coronary artery without angina pectoris: Secondary | ICD-10-CM | POA: Diagnosis not present

## 2022-05-09 DIAGNOSIS — E1122 Type 2 diabetes mellitus with diabetic chronic kidney disease: Secondary | ICD-10-CM | POA: Diagnosis not present

## 2022-05-09 DIAGNOSIS — I152 Hypertension secondary to endocrine disorders: Secondary | ICD-10-CM | POA: Diagnosis not present

## 2022-05-09 DIAGNOSIS — E785 Hyperlipidemia, unspecified: Secondary | ICD-10-CM

## 2022-05-09 DIAGNOSIS — E1159 Type 2 diabetes mellitus with other circulatory complications: Secondary | ICD-10-CM

## 2022-05-09 LAB — CMP14+EGFR
ALT: 15 IU/L (ref 0–32)
AST: 21 IU/L (ref 0–40)
Albumin/Globulin Ratio: 1.2 (ref 1.2–2.2)
Albumin: 4.3 g/dL (ref 3.8–4.8)
Alkaline Phosphatase: 63 IU/L (ref 44–121)
BUN/Creatinine Ratio: 10 — ABNORMAL LOW (ref 12–28)
BUN: 15 mg/dL (ref 8–27)
Bilirubin Total: 0.5 mg/dL (ref 0.0–1.2)
CO2: 24 mmol/L (ref 20–29)
Calcium: 10.1 mg/dL (ref 8.7–10.3)
Chloride: 100 mmol/L (ref 96–106)
Creatinine, Ser: 1.48 mg/dL — ABNORMAL HIGH (ref 0.57–1.00)
Globulin, Total: 3.5 g/dL (ref 1.5–4.5)
Glucose: 129 mg/dL — ABNORMAL HIGH (ref 70–99)
Potassium: 4 mmol/L (ref 3.5–5.2)
Sodium: 141 mmol/L (ref 134–144)
Total Protein: 7.8 g/dL (ref 6.0–8.5)
eGFR: 37 mL/min/{1.73_m2} — ABNORMAL LOW (ref >59)

## 2022-05-09 LAB — LIPID PANEL
Chol/HDL Ratio: 2.4 ratio (ref 0.0–4.4)
Cholesterol, Total: 140 mg/dL (ref 100–199)
HDL: 59 mg/dL (ref >39)
LDL Chol Calc (NIH): 64 mg/dL (ref 0–99)
Triglycerides: 87 mg/dL (ref 0–149)
VLDL Cholesterol Cal: 17 mg/dL (ref 5–40)

## 2022-05-09 LAB — CBC WITH DIFFERENTIAL/PLATELET
Basophils Absolute: 0 10*3/uL (ref 0.0–0.2)
Basos: 0 %
EOS (ABSOLUTE): 0 10*3/uL (ref 0.0–0.4)
Eos: 0 %
Hematocrit: 38.3 % (ref 34.0–46.6)
Hemoglobin: 12.1 g/dL (ref 11.1–15.9)
Immature Grans (Abs): 0 10*3/uL (ref 0.0–0.1)
Immature Granulocytes: 0 %
Lymphocytes Absolute: 1.7 10*3/uL (ref 0.7–3.1)
Lymphs: 16 %
MCH: 27.3 pg (ref 26.6–33.0)
MCHC: 31.6 g/dL (ref 31.5–35.7)
MCV: 86 fL (ref 79–97)
Monocytes Absolute: 1.1 10*3/uL — ABNORMAL HIGH (ref 0.1–0.9)
Monocytes: 10 %
Neutrophils Absolute: 7.3 10*3/uL — ABNORMAL HIGH (ref 1.4–7.0)
Neutrophils: 74 %
Platelets: 239 10*3/uL (ref 150–450)
RBC: 4.44 x10E6/uL (ref 3.77–5.28)
RDW: 12 % (ref 11.7–15.4)
WBC: 10.1 10*3/uL (ref 3.4–10.8)

## 2022-05-09 LAB — BAYER DCA HB A1C WAIVED: HB A1C (BAYER DCA - WAIVED): 8.7 % — ABNORMAL HIGH (ref 4.8–5.6)

## 2022-05-09 MED ORDER — ATORVASTATIN CALCIUM 20 MG PO TABS: 20.0000 mg | ORAL_TABLET | Freq: Every day | ORAL | 3 refills | Status: DC

## 2022-05-09 MED ORDER — JANUMET XR 50-500 MG PO TB24: 1.0000 | ORAL_TABLET | Freq: Two times a day (BID) | ORAL | 3 refills | Status: DC

## 2022-05-09 MED ORDER — GLIPIZIDE 5 MG PO TABS: 5.0000 mg | ORAL_TABLET | Freq: Two times a day (BID) | ORAL | 3 refills | Status: DC

## 2022-05-09 MED ORDER — ACCU-CHEK SOFTCLIX LANCETS MISC: 3 refills | Status: DC

## 2022-05-09 NOTE — Progress Notes (Signed)
BP (!) 146/74   Pulse 65   Ht 5' 3"$  (1.6 m)   Wt 152 lb (68.9 kg)   SpO2 99%   BMI 26.93 kg/m    Subjective:   Patient ID: Particia Carpenter, female    DOB: 1945-10-05, 77 y.o.   MRN: [id number]  HPI: Tonya Carpenter is a 77 y.o. female presenting on 05/09/2022 for Medical Management of Chronic Issues, Diabetes, and Hyperlipidemia   HPI Type 2 diabetes mellitus Patient comes in today for recheck of his diabetes. Patient has been currently taking Janumet. Patient is currently on an ACE inhibitor/ARB. Patient has not seen an ophthalmologist this year. Patient denies any issues with their feet. The symptom started onset as an adult hyperlipidemia and CAD and hypertension and CKD.  ARE RELATED TO DM   Hyperlipidemia and CAD Patient is coming in for recheck of his hyperlipidemia. The patient is currently taking atorvastatin. They deny any issues with myalgias or history of liver damage from it. They deny any focal numbness or weakness or chest pain.   Hypertension Patient is currently on hydralazine and losartan hydrochlorothiazide metoprolol, and their blood pressure today is 146/74. Patient denies any lightheadedness or dizziness. Patient denies headaches, blurred vision, chest pains, shortness of breath, or weakness. Denies any side effects from medication and is content with current medication.   Relevant past medical, surgical, family and social history reviewed and updated as indicated. Interim medical history since our last visit reviewed. Allergies and medications reviewed and updated.  Review of Systems  Constitutional:  Negative for chills and fever.  Eyes:  Negative for visual disturbance.  Respiratory:  Negative for chest tightness and shortness of breath.   Cardiovascular:  Negative for chest pain and leg swelling.  Musculoskeletal:  Negative for back pain and gait problem.  Skin:  Negative for rash.  Neurological:  Negative for dizziness, light-headedness and headaches.   Psychiatric/Behavioral:  Negative for agitation and behavioral problems.   All other systems reviewed and are negative.   Per HPI unless specifically indicated above   Allergies as of 05/09/2022       Reactions   Trilyte [peg [id number] Bicarb-nacl]    Nausea/vomtiing        Medication List        Accurate as of May 09, 2022 10:48 AM. If you have any questions, ask your nurse or doctor.          Accu-Chek Guide w/Device Kit USE AS DIRECTED FOR DIABETES   Accu-Chek Softclix Lancets lancets TEST BLOOD SUGAR TWICE DAILY AND AS NEEDED FOR DIABETES   acetaminophen 500 MG tablet Commonly known as: TYLENOL Take 1,000 mg by mouth every 6 (six) hours as needed for mild pain or headache.   aspirin EC 81 MG tablet Commonly known as: Aspirin Low Dose TAKE 1 TABLET EVERY DAY - SWALLOW WHOLE   atorvastatin 20 MG tablet Commonly known as: LIPITOR Take 1 tablet (20 mg total) by mouth daily.   cholecalciferol 25 MCG (1000 UT) tablet Generic drug: Cholecalciferol TAKE 1 TABLET EVERY DAY   diclofenac Sodium 1 % Gel Commonly known as: VOLTAREN Apply 2 g topically 4 (four) times daily as needed.   DropSafe Alcohol Prep 70 % Pads TEST BLOOD SUGAR TWICE DAILY AND AS NEEDED   feeding supplement Liqd Take 237 mLs by mouth daily.   Fluad Quadrivalent 0.5 ML injection Generic drug: influenza vaccine adjuvanted   glipiZIDE 5 MG tablet Commonly known as: GLUCOTROL Take 1 tablet (5  mg total) by mouth 2 (two) times daily before a meal. Started by: Fransisca Kaufmann Lil Lepage, MD   hydrALAZINE 10 MG tablet Commonly known as: APRESOLINE Take 1 tablet (10 mg total) by mouth 2 (two) times daily.   Janumet XR 50-500 MG Tb24 Generic drug: SitaGLIPtin-MetFORMIN HCl Take 1 tablet by mouth 2 (two) times daily.   losartan-hydrochlorothiazide 50-12.5 MG tablet Commonly known as: HYZAAR TAKE 1 TABLET EVERY DAY   metoprolol succinate 50 MG 24 hr tablet Commonly known as:  TOPROL-XL TAKE 1 TABLET (50 MG TOTAL) DAILY. TAKE WITH OR IMMEDIATELY FOLLOWING A MEAL.   nitroGLYCERIN 0.4 MG SL tablet Commonly known as: NITROSTAT DISSOLVE 1 TABLET UNDER THE TONGUE EVERY 5 MINUTES AS NEEDED FOR CHEST PAIN   omeprazole 40 MG capsule Commonly known as: PRILOSEC Take 1 capsule (40 mg total) by mouth daily.   polyethylene glycol 17 g packet Commonly known as: MIRALAX / GLYCOLAX Take 17 g by mouth daily as needed.   REFRESH RELIEVA OP Apply to eye.   True Metrix Blood Glucose Test test strip Generic drug: glucose blood TEST BLOOD SUGAR TWO TIMES DAILY AND AS NEEDED FOR DIABETES   True Metrix Level 1 Low Soln Use as needed with BS machine   TRUEdraw Lancing Device Misc Teset BS BID Dx E11.9         Objective:   BP (!) 146/74   Pulse 65   Ht 5' 3"$  (1.6 m)   Wt 152 lb (68.9 kg)   SpO2 99%   BMI 26.93 kg/m   Wt Readings from Last 3 Encounters:  05/09/22 152 lb (68.9 kg)  01/31/22 149 lb (67.6 kg)  10/29/21 148 lb (67.1 kg)    Physical Exam Vitals and nursing note reviewed.  Constitutional:      General: She is not in acute distress.    Appearance: She is well-developed. She is not diaphoretic.  Eyes:     Conjunctiva/sclera: Conjunctivae normal.  Cardiovascular:     Rate and Rhythm: Normal rate and regular rhythm.     Heart sounds: Normal heart sounds. No murmur heard. Pulmonary:     Effort: Pulmonary effort is normal. No respiratory distress.     Breath sounds: Normal breath sounds. No wheezing.  Musculoskeletal:        General: No swelling or tenderness. Normal range of motion.  Skin:    General: Skin is warm and dry.     Findings: No rash.  Neurological:     Mental Status: She is alert and oriented to person, place, and time.     Coordination: Coordination normal.  Psychiatric:        Behavior: Behavior normal.       Assessment & Plan:   Problem List Items Addressed This Visit       Cardiovascular and Mediastinum    Hypertension associated with diabetes ([address])   Relevant Medications   atorvastatin (LIPITOR) 20 MG tablet   JANUMET XR 50-500 MG TB24   glipiZIDE (GLUCOTROL) 5 MG tablet   Coronary atherosclerosis of native coronary artery   Relevant Medications   atorvastatin (LIPITOR) 20 MG tablet     Endocrine   Type 2 diabetes mellitus (HCC) - Primary   Relevant Medications   atorvastatin (LIPITOR) 20 MG tablet   JANUMET XR 50-500 MG TB24   glipiZIDE (GLUCOTROL) 5 MG tablet   Other Relevant Orders   Bayer DCA Hb A1c Waived   CBC with Differential/Platelet   CMP14+EGFR   Lipid panel  Hyperlipidemia associated with type 2 diabetes mellitus (HCC)   Relevant Medications   atorvastatin (LIPITOR) 20 MG tablet   JANUMET XR 50-500 MG TB24   glipiZIDE (GLUCOTROL) 5 MG tablet   CKD stage 3 due to type 2 diabetes mellitus (HCC)   Relevant Medications   atorvastatin (LIPITOR) 20 MG tablet   JANUMET XR 50-500 MG TB24   glipiZIDE (GLUCOTROL) 5 MG tablet    Will start a low-dose glipizide, A1c was up to 8.4.  No other changes in medicine. Follow up plan: Return in about 3 months (around 08/07/2022), or if symptoms worsen or fail to improve, for Diabetes hypertension and hyperlipidemia.  Counseling provided for all of the vaccine components Orders Placed This Encounter  Procedures   Bayer [place] Hb A1c Waived   CBC with Differential/Platelet   CMP14+EGFR   Lipid panel    Caryl Pina, MD [facility] 05/09/2022, 10:48 AM

## 2022-05-09 NOTE — Addendum Note (Signed)
Addended by: Alphonzo Dublin on: 05/09/2022 03:44 PM   Modules accepted: Orders

## 2022-05-09 NOTE — Patient Outreach (Signed)
  Care Coordination   Follow Up Visit Note   05/10/2022 Name: Tonya Carpenter MRN: [id number] DOB: May 29, 1945  Tonya Carpenter is a 77 y.o. year old female who sees [name], Fransisca Kaufmann, MD for primary care. I spoke with  Tonya Carpenter by phone today.  What matters to the patients health and wellness today?  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 Denying need of assistance, avoidance,  Memory and literacy, knowledge deficits Interventions Today    Flowsheet Row Most Recent Value  Chronic Disease   Chronic disease during today's visit Hypertension (HTN)  General Interventions   General Interventions Discussed/Reviewed [name], Doctor Visits  [local senior transportation]  Doctor Visits Discussed/Reviewed PCP  PCP/Specialist Visits Compliance with follow-up visit  Communication with PCP/Specialists  [spoke with staff at [employer] to notify patient that RN CM will call her later today]  Education Interventions   Education Provided Provided Education  Provided Verbal Education On Community Resources  [local transportaton]  [place] Reviewed, Coping Strategies  [reports doing well]            SDOH assessments and interventions completed:  No     Care Coordination Interventions:  Yes,  provided   Follow up plan: Follow up call scheduled for 06/06/22    Encounter Outcome:  Pt. Visit Completed   Caelen Higinbotham L. Lavina Hamman, RN, BSN, [place] Coordinator Office number [phone]

## 2022-05-10 LAB — MICROALBUMIN / CREATININE URINE RATIO
Creatinine, Urine: 151.3 mg/dL
Microalb/Creat Ratio: 141 mg/g creat — ABNORMAL HIGH (ref 0–29)
Microalbumin, Urine: 212.8 ug/mL

## 2022-05-10 NOTE — Patient Instructions (Signed)
Visit Information  Thank you for taking time to visit with me today. Please don't hesitate to contact me if I can be of assistance to you.   Following are the goals we discussed today:   Goals Addressed               This Visit's Progress     Patient Stated     COMPLETED: Manage symptoms of cramps (THN) (pt-stated)        Care Coordination Interventions: Denies cramps goal complete      memory, anxiety, home care resources & knowledge deficits [facility]) (pt-stated)   On track     Care Coordination Interventions: Barriers: being unavailable for outreaches. Denying need of assistance, avoidance,  Memory and literacy, knowledge deficits Interventions Today    Flowsheet Row Most Recent Value  Chronic Disease   Chronic disease during today's visit Hypertension (HTN)  General Interventions   General Interventions Discussed/Reviewed [name], Doctor Visits  [local senior transportation]  Doctor Visits Discussed/Reviewed PCP  PCP/Specialist Visits Compliance with follow-up visit  Communication with PCP/Specialists  [spoke with staff at [employer] to notify patient that RN CM will call her later today]  Education Interventions   Education Provided Provided Education  Provided Verbal Education On Community Resources  [local transportaton]  [place] Reviewed, Coping Strategies  [reports doing well]            Our next appointment is by telephone on 06/06/22 at 1000  Please call the care guide team at [phone] if you need to cancel or reschedule your appointment.   If you are experiencing a Mental Health or [place] or need someone to talk to, please call the Suicide and Crisis Lifeline: 988 call the [place] National Suicide Prevention Lifeline: (925)395-7847 or TTY: 303-667-9987 TTY 516-479-3148) to talk to a trained counselor call 1-800-273-TALK (toll free, 24 hour hotline) call the  [facility]: 312-080-5416 call 911   The patient verbalized understanding of instructions, educational materials, and care plan provided today and DECLINED offer to receive copy of patient instructions, educational materials, and care plan.   The patient has been provided with contact information for the care management team and has been advised to call with any health related questions or concerns.   Timiyah Romito L. Lavina Hamman, RN, BSN, [place] Coordinator Office number [phone]

## 2022-05-16 ENCOUNTER — Telehealth: Payer: Self-pay | Admitting: Family Medicine

## 2022-05-16 NOTE — Telephone Encounter (Signed)
  Incoming Patient Call  05/16/2022  What symptoms do you have? Right leg hurting in thigh and took tylenol.Offered patient appt but she wanted to see what [name] said.  How long have you been sick? Started today  Have you been seen for this problem? Yes   If your provider decides to give you a prescription, which pharmacy would you like for it to be sent to? [facility]   Patient informed that this information will be sent to the clinical staff for review and that they should receive a follow up call.

## 2022-05-17 NOTE — Telephone Encounter (Signed)
No answer and no vmail. Pt will need an appt to evaluate. Could be muscular if the Tylenol helped, but still needs to be seen to r/o anything worse.

## 2022-05-21 NOTE — Telephone Encounter (Signed)
Pt has been informed and leg pain has improved.

## 2022-06-05 ENCOUNTER — Ambulatory Visit
Admission: RE | Admit: 2022-06-05 | Discharge: 2022-06-05 | Disposition: A | Discharge status: Home / Self Care | Payer: Medicare HMO | Source: Ambulatory Visit | Attending: Family Medicine | Admitting: Family Medicine

## 2022-06-05 DIAGNOSIS — Z1231 Encounter for screening mammogram for malignant neoplasm of breast: Secondary | ICD-10-CM

## 2022-06-06 ENCOUNTER — Ambulatory Visit: Payer: Self-pay | Admitting: *Deleted

## 2022-06-06 NOTE — Patient Instructions (Addendum)
Visit Information  Thank you for taking time to visit with me today. Please don't hesitate to contact me if I can be of assistance to you.   Following are the goals we discussed today:   Goals Addressed               This Visit's Progress     Patient Stated     memory, anxiety, home care resources & knowledge deficits [facility]) (pt-stated)   Not on track     Care Coordination Interventions: Barriers: being unavailable for outreaches. Denying need of assistance, avoidance,  Memory and literacy, knowledge deficits Interventions Today    Flowsheet Row Most Recent Value  Chronic Disease   Chronic disease during today's visit Other  General Interventions   General Interventions Discussed/Reviewed General Interventions Discussed, General Interventions Reviewed, Community Resources, Doctor Visits  Doctor Visits Discussed/Reviewed PCP, Specialist, Doctor Visits Reviewed  PCP/Specialist Visits Compliance with follow-up visit  [encouraged]  Education Interventions   Education Provided Provided Education  [caution with speaking or visiting with people she does not know and giving out her medicare and social security information]  Provided Verbal Education On [employer], When to see the doctor, [employer] Resources  Pharmacy Interventions   Pharmacy Dicussed/Reviewed Medications and their functions, Pharmacy Topics Reviewed  Safety Interventions   Safety Discussed/Reviewed Safety Reviewed             Our next appointment is by telephone on 07/12/22 at 1000  Please call the care guide team at [phone] if you need to cancel or reschedule your appointment.   If you are experiencing a Mental Health or [place] or need someone to talk to, please call the Suicide and Crisis Lifeline: 988 call the [place] National Suicide Prevention Lifeline: 6140040863 or TTY: 4057701349 TTY 219-213-2813) to talk to a trained counselor call 1-800-273-TALK (toll free, 24 hour  hotline) call the [facility]: [phone] call 911   The patient verbalized understanding of instructions, educational materials, and care plan provided today and DECLINED offer to receive copy of patient instructions, educational materials, and care plan.   The patient has been provided with contact information for the care management team and has been advised to call with any health related questions or concerns.   Morley Gaumer L. Lavina Hamman, RN, BSN, [place] Coordinator Office number [phone]

## 2022-06-06 NOTE — Patient Outreach (Signed)
  Care Coordination   Follow Up Visit Note   06/06/2022 Name: Tonya Carpenter MRN: [id number] DOB: 08-14-1945  Tonya Carpenter is a 77 y.o. year old female who sees [name], Fransisca Kaufmann, MD for primary care. I spoke with  Tonya Carpenter by phone today.  What matters to the patients health and wellness today?  Follow up on patient's reported right leg thigh pain from 05/16/22 Took Tylenol that help her right leg & thigh pain  She now inquired again about "getting help," "I can not read too well," " I get nervous," " I need help around here," " I got disability" "I don't know a lot"   She reported today that she "had paper work done with a girl yesterday who was sent over by a man I know, about my life insurance" Now on [place] per patient Does not know "the girl" name,  Caution her related to discussing her medicare and bank information with people she does not know  Patient able to spell the name on a card left by "Angela Adam" of "assurance group, " " [phone] " "[employer]" Also work with "Cathy from [place]" [place] to have RN CM outreach to her in April 2024 so she can decide if she wants assist with medicaid, CAPs assistance forms   Goals Addressed               This Visit's Progress     Patient Stated     memory, anxiety, home care resources & knowledge deficits (THN) (pt-stated)   Not on track     Care Coordination Interventions: Barriers: being unavailable for outreaches. Denying need of assistance, avoidance,  Memory and literacy, knowledge deficits Interventions Today    Flowsheet Row Most Recent Value  Chronic Disease   Chronic disease during today's visit Other  General Interventions   General Interventions Discussed/Reviewed General Interventions Discussed, General Interventions Reviewed, Community Resources, Doctor Visits  Doctor Visits Discussed/Reviewed PCP, Specialist, Doctor Visits Reviewed  PCP/Specialist Visits  Compliance with follow-up visit  [encouraged]  Education Interventions   Education Provided Provided Education  [caution with speaking or visiting with people she does not know and giving out her medicare and social security information]  Provided Verbal Education On [employer], When to see the doctor, [place] Interventions   Pharmacy Dicussed/Reviewed Medications and their functions, Pharmacy Topics Reviewed  Safety Interventions   Safety Discussed/Reviewed Safety Reviewed             SDOH assessments and interventions completed:  No     Care Coordination Interventions:  Yes, provided   Follow up plan: Follow up call scheduled for 07/12/22    Encounter Outcome:  Pt. Visit Completed   Jayse Hodkinson L. Lavina Hamman, RN, BSN, [place] Coordinator Office number [phone]

## 2022-06-10 ENCOUNTER — Other Ambulatory Visit: Payer: Self-pay | Admitting: Family Medicine

## 2022-06-10 DIAGNOSIS — I152 Hypertension secondary to endocrine disorders: Secondary | ICD-10-CM

## 2022-06-12 ENCOUNTER — Telehealth: Payer: Self-pay | Admitting: Family Medicine

## 2022-06-17 ENCOUNTER — Telehealth: Payer: Self-pay

## 2022-06-17 ENCOUNTER — Telehealth: Payer: Self-pay | Admitting: Family Medicine

## 2022-06-17 NOTE — Telephone Encounter (Signed)
Yes if her blood sugar is going down, have her stop the glipizide, do not take the glipizide anymore and go ahead and throw it away.

## 2022-06-17 NOTE — Telephone Encounter (Signed)
Patient called and came by - states that since starting the Glipizide she has been sick and that she has taken medication in the past and it done the same thing. She wants to stop this medication at this time please review and advise. Patient reports BS at home today at 6 and she was barely able to make it to the kitchen to get something to eat.  Patient is not sure what she has ate today and at one point stated that she was heading to fix breakfast. Patient refused to recheck blood sugar, states that she feels better since eating and taking 2 tylenol. Patient complains that the medication is making her weak, causing nausea, and some dizziness at times. Patient was alert while speaking with me.  I advised the patient to check BS (especially when feeling bad), to make sure she is eating during the day, and that if she feels weak, dizzy, faint to check BS at that time if it is low to get some sugar, juice, glucose tablets and call 911. P

## 2022-06-17 NOTE — Telephone Encounter (Signed)
Please see note in chart

## 2022-06-17 NOTE — Telephone Encounter (Signed)
Pt has been informed and understood. 

## 2022-06-17 NOTE — Telephone Encounter (Signed)
Pt was informed of this. She will speak to one of her sons and try to get him to come to an appt with her to discuss this. Pt really does not want to leave her current residence.

## 2022-06-17 NOTE — Telephone Encounter (Signed)
Yes I agree, we tried multiple times to get help at home, she does not qualify based on her current insurance.  She either needs to rely on family or call the [place] and social services to see if they can offer any help.

## 2022-07-05 ENCOUNTER — Ambulatory Visit (INDEPENDENT_AMBULATORY_CARE_PROVIDER_SITE_OTHER): Payer: Medicare HMO

## 2022-07-05 VITALS — Ht 65.0 in | Wt 152.0 lb

## 2022-07-05 DIAGNOSIS — Z0000 Encounter for general adult medical examination without abnormal findings: Secondary | ICD-10-CM

## 2022-07-05 NOTE — Progress Notes (Signed)
Subjective:   Tonya Carpenter is a 77 y.o. female who presents for Medicare Annual (Subsequent) preventive examination. I connected with  Tonya Carpenter on 07/05/22 by a audio enabled telemedicine application and verified that I am speaking with the correct person using two identifiers.  Patient Location: Home  Provider Location: Home Office  I discussed the limitations of evaluation and management by telemedicine. The patient expressed understanding and agreed to proceed.  Review of Systems     Cardiac Risk Factors include: advanced age (>33men, >36 women);diabetes mellitus;dyslipidemia;hypertension     Objective:    Today's Vitals   07/05/22 1158  Weight: 152 lb (68.9 kg)  Height:  (1.651 m)   Body mass index is 25.29 kg/m.     07/05/2022   12:02 PM 01/30/2022   11:48 PM 11/29/2021   12:39 PM 06/28/2021   11:38 AM 06/27/2020   10:32 AM 09/24/2019    5:08 PM 06/25/2019   10:04 AM  Advanced Directives  Does Patient Have a Medical Advance Directive? No No Yes No No No No  Type of Surveyor, minerals;Living will      Does patient want to make changes to medical advance directive?   No - Patient declined      Copy of Healthcare Power of Attorney in Chart?   No - copy requested      Would patient like information on creating a medical advance directive? No - Patient declined No - Patient declined  No - Patient declined No - Patient declined No - Patient declined No - Patient declined    Current Medications (verified) Outpatient Encounter Medications as of 07/05/2022  Medication Sig   Accu-Chek Softclix Lancets lancets TEST BLOOD SUGAR TWICE DAILY AND AS NEEDED FOR DIABETES   acetaminophen (TYLENOL) 500 MG tablet Take 1,000 mg by mouth every 6 (six) hours as needed for mild pain or headache.    Alcohol Swabs (DROPSAFE ALCOHOL PREP) 70 % PADS TEST BLOOD SUGAR TWICE DAILY AND AS NEEDED   aspirin EC (ASPIRIN LOW DOSE) 81 MG tablet TAKE 1 TABLET EVERY DAY -  SWALLOW WHOLE   atorvastatin (LIPITOR) 20 MG tablet Take 1 tablet (20 mg total) by mouth daily.   Blood Glucose Calibration (TRUE METRIX LEVEL 1) Low SOLN Use as needed with BS machine   Blood Glucose Monitoring Suppl (ACCU-CHEK GUIDE) w/Device KIT USE AS DIRECTED FOR DIABETES   Carboxymethylcellul-Glycerin (REFRESH RELIEVA OP) Apply to eye.   cholecalciferol (VITAMIN D3) 25 MCG (1000 UNIT) tablet TAKE 1 TABLET EVERY DAY   diclofenac Sodium (VOLTAREN) 1 % GEL Apply 2 g topically 4 (four) times daily as needed.   feeding supplement (ENSURE ENLIVE / ENSURE PLUS) LIQD Take 237 mLs by mouth daily.   FLUAD QUADRIVALENT 0.5 ML injection    glipiZIDE (GLUCOTROL) 5 MG tablet Take 1 tablet (5 mg total) by mouth 2 (two) times daily before a meal.   hydrALAZINE (APRESOLINE) 10 MG tablet Take 1 tablet (10 mg total) by mouth 2 (two) times daily.   JANUMET XR 50-500 MG TB24 Take 1 tablet by mouth 2 (two) times daily.   Lancet Devices (TRUEDRAW LANCING DEVICE) MISC Teset BS BID Dx E11.9   losartan-hydrochlorothiazide (HYZAAR) 50-12.5 MG tablet TAKE 1 TABLET EVERY DAY   metoprolol succinate (TOPROL-XL) 50 MG 24 hr tablet TAKE 1 TABLET (50 MG TOTAL) DAILY. TAKE WITH OR IMMEDIATELY FOLLOWING A MEAL.   nitroGLYCERIN (NITROSTAT) 0.4 MG SL tablet DISSOLVE 1  TABLET UNDER THE TONGUE EVERY 5 MINUTES AS NEEDED FOR CHEST PAIN   omeprazole (PRILOSEC) 40 MG capsule Take 1 capsule (40 mg total) by mouth daily.   polyethylene glycol (MIRALAX / GLYCOLAX) 17 g packet Take 17 g by mouth daily as needed.   TRUE METRIX BLOOD GLUCOSE TEST test strip TEST BLOOD SUGAR TWO TIMES DAILY AND AS NEEDED FOR DIABETES   No facility-administered encounter medications on file as of 07/05/2022.    Allergies (verified) Trilyte [peg [id number] bicarb-nacl]   History: Past Medical History:  Diagnosis Date   Anxiety    Arthritis    "hands" (01/14/2013)   Asthma    Cataract    Chronic right shoulder pain 01/31/2022   Coronary artery  disease    Non obstructive CAD 2014 cath.    Exertional shortness of breath    Hyperlipidemia    Hypertension    Memory changes 01/31/2022   Stroke    "light stroke long years ago; didn't affect me permanently" (01/14/2013)   Type 2 diabetes mellitus    Past Surgical History:  Procedure Laterality Date   APPENDECTOMY     CARDIAC CATHETERIZATION  2008   no angiographically significant CAD   CARDIOVASCULAR STRESS TEST  12/2009   no evidence for stress-induced reversibility or ischemia.  She had a fixed anterior septal wall defect, possibly related to breast attenuation and her EF was 54%.   CATARACT EXTRACTION W/PHACO Right 10/25/2014   Procedure: CATARACT EXTRACTION PHACO AND INTRAOCULAR LENS PLACEMENT; CDE:  17.28;  Surgeon: Jethro Bolus, MD;  Location: [facility];  Service: Ophthalmology;  Laterality: Right;   CATARACT EXTRACTION W/PHACO Left 11/08/2014   Procedure: CATARACT EXTRACTION PHACO AND INTRAOCULAR LENS PLACEMENT (IOC);  Surgeon: Jethro Bolus, MD;  Location: [facility];  Service: Ophthalmology;  Laterality: Left;  CDE: 5.94   CHOLECYSTECTOMY     COLONOSCOPY N/A 03/31/2019   Procedure: COLONOSCOPY;  Surgeon: West Bali, MD;  Location: [facility] SUITE;  Service: Endoscopy;  Laterality: N/A;  10:30   DILATION AND CURETTAGE OF UTERUS     LEFT HEART CATHETERIZATION WITH CORONARY ANGIOGRAM N/A 01/14/2013   Procedure: LEFT HEART CATHETERIZATION WITH CORONARY ANGIOGRAM;  Surgeon: Kathleene Hazel, MD;  Location: [facility] CATH LAB;  Service: Cardiovascular;  Laterality: N/A;   TONSILLECTOMY     TUBAL LIGATION     VAGINAL HYSTERECTOMY     Family History  Problem Relation Age of Onset   Hypertension Mother    Hypertension Father    Cancer Sister        breast   Heart Problems Son    Seizures Son    Social History   Socioeconomic History   Marital status: Widowed    Spouse name: [name]   Number of children: 2   Years of education: 12th   Highest education level: 12th grade   Occupational History   Occupation: Conservator, museum/gallery: UNEMPLOYED  Tobacco Use   Smoking status: Never    Passive exposure: Never   Smokeless tobacco: Never  Vaping Use   Vaping Use: Never used  Substance and Sexual Activity   Alcohol use: No   Drug use: No   Sexual activity: Not Currently    Partners: Male    Birth control/protection: Surgical  Other Topics Concern   Not on file  Social History Narrative   Lives in [place], [place] alone in [place] apartment- her 2 sons live in [place] and visit frequently.   She reports that  her sons can "barley take care of themselves"   Her husband passed in 2020 from cancer. She was his caregiver   She if very involved in her church during the weekends. 2 female church members assist with her transport to church   She reports having a DSS case worker last name "Daphine Deutscher"   Social Determinants of Health   Financial Resource Strain: Low Risk  (07/05/2022)   Overall Financial Resource Strain (CARDIA)    Difficulty of Paying Living Expenses: Not hard at all  Food Insecurity: No Food Insecurity (07/05/2022)   Hunger Vital Sign    Worried About Running Out of Food in the Last Year: Never true    Ran Out of Food in the Last Year: Never true  Transportation Needs: No Transportation Needs (07/05/2022)   PRAPARE - Administrator, Civil Service (Medical): No    Lack of Transportation (Non-Medical): No  Physical Activity: Insufficiently Active (07/05/2022)   Exercise Vital Sign    Days of Exercise per Week: 3 days    Minutes of Exercise per Session: 30 min  Stress: No Stress Concern Present (07/05/2022)   [employer] of Occupational Health - Occupational Stress Questionnaire    Feeling of Stress : Not at all  Social Connections: Moderately Integrated (07/05/2022)   Social Connection and Isolation Panel [NHANES]    Frequency of Communication with Friends and Family: More than three times a week    Frequency of Social  Gatherings with Friends and Family: More than three times a week    Attends Religious Services: More than 4 times per year    Active Member of [employer] or Organizations: Yes    Attends Banker Meetings: More than 4 times per year    Marital Status: Widowed    Tobacco Counseling Counseling given: Not Answered   Clinical Intake:  Pre-visit preparation completed: Yes  Pain : No/denies pain     Nutritional Risks: None Diabetes: No  How often do you need to have someone help you when you read instructions, pamphlets, or other written materials from your doctor or pharmacy?: 1 - Never  Diabetic?yes Nutrition Risk Assessment:  Has the patient had any N/V/D within the last 2 months?  No  Does the patient have any non-healing wounds?  No  Has the patient had any unintentional weight loss or weight gain?  No   Diabetes:  Is the patient diabetic?  Yes  If diabetic, was a CBG obtained today?  No  Did the patient bring in their glucometer from home?  No  How often do you monitor your CBG's? 2 x day .   Financial Strains and Diabetes Management:  Are you having any financial strains with the device, your supplies or your medication? No .  Does the patient want to be seen by Chronic Care Management for management of their diabetes?  No  Would the patient like to be referred to a Nutritionist or for Diabetic Management?  No   Diabetic Exams:  Diabetic Eye Exam: Completed 06/2022 Diabetic Foot Exam: Overdue, Pt has been advised about the importance in completing this exam. Pt is scheduled for diabetic foot exam on next office visit .   Interpreter Needed?: No  Information entered by :: Renie Ora, LPN   Activities of Daily Living    07/05/2022   12:02 PM 01/30/2022   11:45 PM  In your present state of health, do you have any difficulty performing the following activities:  Hearing?  0 0  Vision? 0 0  Difficulty concentrating or making decisions? 0 1  Walking or  climbing stairs? 0 0  Dressing or bathing? 0 0  Doing errands, shopping? 0 0  Preparing Food and eating ? N N  Using the Toilet? N N  In the past six months, have you accidently leaked urine? N N  Do you have problems with loss of bowel control? N N  Managing your Medications? N N  Managing your Finances? N N  Housekeeping or managing your Housekeeping? N Y    Patient Care Team: [name], Elige Radon, MD as PCP - General (Family Medicine) Rollene Rotunda, MD as PCP - Cardiology (Cardiology) Michaelle Copas, MD as Referring Physician (Optometry) Clinton Gallant, RN as [facility] Care Management  Indicate any recent Medical Services you may have received from other than [facility] providers in the past year (date may be approximate).     Assessment:   This is a routine wellness examination for [facility] [place].  Hearing/Vision screen Vision Screening - Comments:: Wears rx glasses - up to date with routine eye exams with  Dr.Lee   Dietary issues and exercise activities discussed: Current Exercise Habits: The patient does not participate in regular exercise at present, Exercise limited by: orthopedic condition(s)   Goals Addressed             This Visit's Progress    DIET - INCREASE WATER INTAKE         Depression Screen    07/05/2022   12:01 PM 05/09/2022    9:52 AM 04/23/2022    1:08 PM 01/30/2022   11:51 PM 12/17/2021   10:35 AM 11/29/2021   12:33 PM 11/14/2021   10:29 AM  PHQ 2/9 Scores  PHQ - 2 Score 0 0 PHQ- 9 Score 0     3 8    Fall Risk    07/05/2022   11:59 AM 05/09/2022    9:52 AM 01/30/2022   10:52 PM 12/17/2021   10:27 AM 11/29/2021   12:37 PM  Fall Risk   Falls in the past year? 0 Comment   reports a history of falling and fracturing a ring finger ans pinkie    Number falls in past yr: 0 0  0 0  Injury with Fall? 0 1  0 0  Risk for fall due to : No Fall Risks Impaired balance/gait  History of fall(s);Impaired balance/gait;Impaired  mobility Impaired balance/gait;Impaired mobility  Follow up Falls prevention discussed Falls evaluation completed  Falls evaluation completed Falls evaluation completed;Education provided;Falls prevention discussed    FALL RISK PREVENTION PERTAINING TO THE HOME:  Any stairs in or around the home? No  If so, are there any without handrails? No  Home free of loose throw rugs in walkways, pet beds, electrical cords, etc? Yes  Adequate lighting in your home to reduce risk of falls? Yes   ASSISTIVE DEVICES UTILIZED TO PREVENT FALLS:  Life alert? No  Use of a cane, walker or w/c? Yes  Grab bars in the bathroom? No  Shower chair or bench in shower? No  Elevated toilet seat or a handicapped toilet? No       01/31/2022   10:16 AM 06/28/2021   11:46 AM 01/14/2017   11:26 AM 01/10/2016   12:19 PM  MMSE - Mini Mental State Exam  Not completed:  Refused    Orientation to time 4  3  5  Orientation to Place 3  4 5   Registration 3  3 3   Attention/ Calculation 0  0   Recall 1  2 0  Language- name 2 objects 2  2 2   Language- repeat 1  0 1  Language- follow 3 step command 3  3 3   Language- read & follow direction 1  1 1   Write a sentence 0  1 1  Copy design 0  0 1  Total score 18  19         07/05/2022   12:03 PM 06/25/2019    9:49 AM 02/24/2018   10:57 AM 02/24/2018   10:54 AM  6CIT Screen  What Year? 0 points 0 points  0 points  What month? 0 points 0 points  0 points  What time? 0 points 0 points  0 points  Count back from 20 0 points 4 points 4 points   Months in reverse 0 points 4 points 4 points   Repeat phrase 0 points 10 points 2 points   Total Score 0 points 18 points      Immunizations Immunization History  Administered Date(s) Administered   Fluad Quad(high Dose 65+) 01/05/2020, 12/07/2020, 12/10/2021   Influenza, High Dose Seasonal PF 12/22/2017   Influenza,inj,Quad PF,6+ Mos 12/18/2012, 02/02/2014, 01/03/2015, 01/10/2016, 01/13/2017   Influenza,inj,quad, With  Preservative 12/22/2018   Influenza-Unspecified 01/03/2020   Moderna Covid-19 Vaccine Bivalent Booster 61yrs & up 01/02/2021   Moderna Sars-Covid-2 Vaccination 05/13/2019, 06/09/2019, 02/01/2020   Pneumococcal Conjugate-13 12/23/2012   Pneumococcal Polysaccharide-23 05/25/2014   Tdap 07/19/2013, 09/25/2019   Zoster Recombinat (Shingrix) 02/24/2018, 10/26/2020   Zoster, Live 06/04/2013    TDAP status: Up to date  Flu Vaccine status: Up to date  Pneumococcal vaccine status: Up to date  Covid-19 vaccine status: Completed vaccines  Qualifies for Shingles Vaccine? Yes   Zostavax completed Yes   Shingrix Completed?: Yes  Screening Tests Health Maintenance  Topic Date Due   OPHTHALMOLOGY EXAM  08/03/2021   COVID-19 Vaccine (5 - 2023-24 season) 11/16/2021   INFLUENZA VACCINE  10/17/2022   HEMOGLOBIN A1C  11/07/2022   DEXA SCAN  02/26/2023   Diabetic kidney evaluation - eGFR measurement  05/10/2023   Diabetic kidney evaluation - Urine ACR  05/10/2023   FOOT EXAM  05/10/2023   MAMMOGRAM  06/05/2023   Medicare Annual Wellness (AWV)  07/05/2023   COLONOSCOPY (Pts 45-80yrs Insurance coverage will need to be confirmed)  03/30/2029   DTaP/Tdap/Td (3 - Td or Tdap) 09/24/2029   Pneumonia Vaccine 47+ Years old  Completed   Hepatitis C Screening  Completed   Zoster Vaccines- Shingrix  Completed   HPV VACCINES  Aged Out    Health Maintenance  Health Maintenance Due  Topic Date Due   OPHTHALMOLOGY EXAM  08/03/2021   COVID-19 Vaccine (5 - 2023-24 season) 11/16/2021    Colorectal cancer screening: Type of screening: Colonoscopy. Completed 03/31/2019. Repeat every 10 years  Mammogram status: Completed 06/05/2022. Repeat every year  Bone Density status: Completed 02/26/2023. Results reflect: Bone density results: OSTEOPENIA. Repeat every 5 years.  Lung Cancer Screening: (Low Dose CT Chest recommended if Age 55-80 years, 30 pack-year currently smoking OR have quit w/in 15years.) does  not qualify.   Lung Cancer Screening Referral: n/a  Additional Screening:  Hepatitis C Screening: does not qualify; Completed 01/10/2016  Vision Screening: Recommended annual ophthalmology exams for early detection of glaucoma and other disorders of the eye. Is the patient up to date with their  annual eye exam?  Yes  Who is the provider or what is the name of the office in which the patient attends annual eye exams? Dr.Lee  If pt is not established with a provider, would they like to be referred to a provider to establish care? No .   Dental Screening: Recommended annual dental exams for proper oral hygiene  Community Resource Referral / Chronic Care Management: CRR required this visit?  No   CCM required this visit?  No      Plan:     I have personally reviewed and noted the following in the patient's chart:   Medical and social history Use of alcohol, tobacco or illicit drugs  Current medications and supplements including opioid prescriptions. Patient is not currently taking opioid prescriptions. Functional ability and status Nutritional status Physical activity Advanced directives List of other physicians Hospitalizations, surgeries, and ER visits in previous 12 months Vitals Screenings to include cognitive, depression, and falls Referrals and appointments  In addition, I have reviewed and discussed with patient certain preventive protocols, quality metrics, and best practice recommendations. A written personalized care plan for preventive services as well as general preventive health recommendations were provided to patient.     Lorrene Reid, LPN   1/61/0960   Nurse Notes: none

## 2022-07-05 NOTE — Patient Instructions (Signed)
Tonya Carpenter , Thank you for taking time to come for your Medicare Wellness Visit. I appreciate your ongoing commitment to your health goals. Please review the following plan we discussed and let me know if I can assist you in the future.   These are the goals we discussed:  Goals       DIET - INCREASE WATER INTAKE      Exercise 150 minutes per week (moderate activity)      Maintain Diabetes ([facility]N) (pt-stated)      Care Coordination Interventions: Discussed plans with patient for ongoing care management follow up and provided patient with direct contact information for care management team Assessed social determinant of health barriers Last HgA1c = 6.8 on 01/31/22 up from 6.2 on 10/26/21 Inquired about any worsening changes, cbg values      memory, anxiety, home care resources & knowledge deficits [facility]) (pt-stated)      Care Coordination Interventions: Barriers: being unavailable for outreaches. Denying need of assistance, avoidance,  Memory and literacy, knowledge deficits Interventions Today    Flowsheet Row Most Recent Value  Chronic Disease   Chronic disease during today's visit Other  General Interventions   General Interventions Discussed/Reviewed General Interventions Discussed, General Interventions Reviewed, Community Resources, Doctor Visits  Doctor Visits Discussed/Reviewed PCP, Specialist, Doctor Visits Reviewed  PCP/Specialist Visits Compliance with follow-up visit  [encouraged]  Education Interventions   Education Provided Provided Education  [caution with speaking or visiting with people she does not know and giving out her medicare and social security information]  Provided Verbal Education On [employer], When to see the doctor, [employer]  Pharmacy Interventions   Pharmacy Dicussed/Reviewed Medications and their functions, Pharmacy Topics Reviewed  Safety Interventions   Safety Discussed/Reviewed Safety Reviewed             This is a list of the  screening recommended for you and due dates:  Health Maintenance  Topic Date Due   Eye exam for diabetics  08/03/2021   COVID-19 Vaccine (5 - 2023-24 season) 11/16/2021   Flu Shot  10/17/2022   Hemoglobin A1C  11/07/2022   DEXA scan (bone density measurement)  02/26/2023   Yearly kidney function blood test for diabetes  05/10/2023   Yearly kidney health urinalysis for diabetes  05/10/2023   Complete foot exam   05/10/2023   Mammogram  06/05/2023   Medicare Annual Wellness Visit  07/05/2023   Colon Cancer Screening  03/30/2029   DTaP/Tdap/Td vaccine (3 - Td or Tdap) 09/24/2029   Pneumonia Vaccine  Completed   Hepatitis C Screening: USPSTF Recommendation to screen - Ages 48-79 yo.  Completed   Zoster (Shingles) Vaccine  Completed   HPV Vaccine  Aged Out    Advanced directives: Advance directive discussed with you today. I have provided a copy for you to complete at home and have notarized. Once this is complete please bring a copy in to our office so we can scan it into your chart.   Conditions/risks identified: Aim for 30 minutes of exercise or brisk walking, 6-8 glasses of water, and 5 servings of fruits and vegetables each day.   Next appointment: Follow up in one year for your annual wellness visit    Preventive Care 65 Years and Older, Female Preventive care refers to lifestyle choices and visits with your health care provider that can promote health and wellness. What does preventive care include? A yearly physical exam. This is also called an annual well check. Dental exams once  or twice a year. Routine eye exams. Ask your health care provider how often you should have your eyes checked. Personal lifestyle choices, including: Daily care of your teeth and gums. Regular physical activity. Eating a healthy diet. Avoiding tobacco and drug use. Limiting alcohol use. Practicing safe sex. Taking low-dose aspirin every day. Taking vitamin and mineral supplements as recommended  by your health care provider. What happens during an annual well check? The services and screenings done by your health care provider during your annual well check will depend on your age, overall health, lifestyle risk factors, and family history of disease. Counseling  Your health care provider may ask you questions about your: Alcohol use. Tobacco use. Drug use. Emotional well-being. Home and relationship well-being. Sexual activity. Eating habits. History of falls. Memory and ability to understand (cognition). Work and work Astronomer. Reproductive health. Screening  You may have the following tests or measurements: Height, weight, and BMI. Blood pressure. Lipid and cholesterol levels. These may be checked every 5 years, or more frequently if you are over 58 years old. Skin check. Lung cancer screening. You may have this screening every year starting at age 51 if you have a 30-pack-year history of smoking and currently smoke or have quit within the past 15 years. Fecal occult blood test (FOBT) of the stool. You may have this test every year starting at age 46. Flexible sigmoidoscopy or colonoscopy. You may have a sigmoidoscopy every 5 years or a colonoscopy every 10 years starting at age 28. Hepatitis C blood test. Hepatitis B blood test. Sexually transmitted disease (STD) testing. Diabetes screening. This is done by checking your blood sugar (glucose) after you have not eaten for a while (fasting). You may have this done every 1-3 years. Bone density scan. This is done to screen for osteoporosis. You may have this done starting at age 6. Mammogram. This may be done every 1-2 years. Talk to your health care provider about how often you should have regular mammograms. Talk with your health care provider about your test results, treatment options, and if necessary, the need for more tests. Vaccines  Your health care provider may recommend certain vaccines, such as: Influenza  vaccine. This is recommended every year. Tetanus, diphtheria, and acellular pertussis (Tdap, Td) vaccine. You may need a Td booster every 10 years. Zoster vaccine. You may need this after age 33. Pneumococcal 13-valent conjugate (PCV13) vaccine. One dose is recommended after age 44. Pneumococcal polysaccharide (PPSV23) vaccine. One dose is recommended after age 39. Talk to your health care provider about which screenings and vaccines you need and how often you need them. This information is not intended to replace advice given to you by your health care provider. Make sure you discuss any questions you have with your health care provider. Document Released: 03/31/2015 Document Revised: 11/22/2015 Document Reviewed: 01/03/2015 Elsevier Interactive Patient Education  2017 [employer].  Fall Prevention in the Home Falls can cause injuries. They can happen to people of all ages. There are many things you can do to make your home safe and to help prevent falls. What can I do on the outside of my home? Regularly fix the edges of walkways and driveways and fix any cracks. Remove anything that might make you trip as you walk through a door, such as a raised step or threshold. Trim any bushes or trees on the path to your home. Use bright outdoor lighting. Clear any walking paths of anything that might make someone trip, such  as rocks or tools. Regularly check to see if handrails are loose or broken. Make sure that both sides of any steps have handrails. Any raised decks and porches should have guardrails on the edges. Have any leaves, snow, or ice cleared regularly. Use sand or salt on walking paths during winter. Clean up any spills in your garage right away. This includes oil or grease spills. What can I do in the bathroom? Use night lights. Install grab bars by the toilet and in the tub and shower. Do not use towel bars as grab bars. Use non-skid mats or decals in the tub or shower. If you  need to sit down in the shower, use a plastic, non-slip stool. Keep the floor dry. Clean up any water that spills on the floor as soon as it happens. Remove soap buildup in the tub or shower regularly. Attach bath mats securely with double-sided non-slip rug tape. Do not have throw rugs and other things on the floor that can make you trip. What can I do in the bedroom? Use night lights. Make sure that you have a light by your bed that is easy to reach. Do not use any sheets or blankets that are too big for your bed. They should not hang down onto the floor. Have a firm chair that has side arms. You can use this for support while you get dressed. Do not have throw rugs and other things on the floor that can make you trip. What can I do in the kitchen? Clean up any spills right away. Avoid walking on wet floors. Keep items that you use a lot in easy-to-reach places. If you need to reach something above you, use a strong step stool that has a grab bar. Keep electrical cords out of the way. Do not use floor polish or wax that makes floors slippery. If you must use wax, use non-skid floor wax. Do not have throw rugs and other things on the floor that can make you trip. What can I do with my stairs? Do not leave any items on the stairs. Make sure that there are handrails on both sides of the stairs and use them. Fix handrails that are broken or loose. Make sure that handrails are as long as the stairways. Check any carpeting to make sure that it is firmly attached to the stairs. Fix any carpet that is loose or worn. Avoid having throw rugs at the top or bottom of the stairs. If you do have throw rugs, attach them to the floor with carpet tape. Make sure that you have a light switch at the top of the stairs and the bottom of the stairs. If you do not have them, ask someone to add them for you. What else can I do to help prevent falls? Wear shoes that: Do not have high heels. Have rubber  bottoms. Are comfortable and fit you well. Are closed at the toe. Do not wear sandals. If you use a stepladder: Make sure that it is fully opened. Do not climb a closed stepladder. Make sure that both sides of the stepladder are locked into place. Ask someone to hold it for you, if possible. Clearly mark and make sure that you can see: Any grab bars or handrails. First and last steps. Where the edge of each step is. Use tools that help you move around (mobility aids) if they are needed. These include: Canes. Walkers. Scooters. Crutches. Turn on the lights when you go into a  dark area. Replace any light bulbs as soon as they burn out. Set up your furniture so you have a clear path. Avoid moving your furniture around. If any of your floors are uneven, fix them. If there are any pets around you, be aware of where they are. Review your medicines with your doctor. Some medicines can make you feel dizzy. This can increase your chance of falling. Ask your doctor what other things that you can do to help prevent falls. This information is not intended to replace advice given to you by your health care provider. Make sure you discuss any questions you have with your health care provider. Document Released: 12/29/2008 Document Revised: 08/10/2015 Document Reviewed: 04/08/2014 Elsevier Interactive Patient Education  2017 [employer].

## 2022-07-07 NOTE — Progress Notes (Unsigned)
  Cardiology Office Note:   Date:  07/10/2022  ID:  Tonya Carpenter, Tonya Carpenter 10/26/1945, MRN [id number]  History of Present Illness:   HAIZLEY Carpenter is a 77 y.o. female who was referred by [name], Elige Radon, MD for evaluation of chest pain.   She has had cardiac work up in the past.  Echo in 2011 demonstrated a low normal EF.  There was a small pericardial effusion.   Cardiac cath in 2014 demonstrated only mild coronary plaque.    Since I last saw her she has had no new cardiac complaints.  She does a lot of walking.  She lives right across the street so she walks down to the [employer].  She walks around to get exercise. The patient denies any new symptoms such as chest discomfort, neck or arm discomfort. There has been no new shortness of breath, PND or orthopnea. There have been no reported palpitations, presyncope or syncope.   ROS: As stated in the HPI and negative for all other systems.  Studies Reviewed:    EKG: Sinus rhythm, left bundle branch block, rate 61, no change from previous   Risk Assessment/Calculations:      Physical Exam:   VS:  BP 110/62   Pulse 61   Ht  (1.6 m)   Wt 155 lb (70.3 kg)   BMI 27.46 kg/m    Wt Readings from Last 3 Encounters:  07/10/22 155 lb (70.3 kg)  07/05/22 152 lb (68.9 kg)  05/09/22 152 lb (68.9 kg)     GEN: Well nourished, well developed in no acute distress NECK: No JVD; No carotid bruits CARDIAC: RRR, very brief soft apical nonradiating systolic murmur, no diastolic murmurs, rubs, gallops RESPIRATORY:  Clear to auscultation without rales, wheezing or rhonchi  ABDOMEN: Soft, non-tender, non-distended EXTREMITIES:  No edema; No deformity   ASSESSMENT AND PLAN:   CHEST PAIN:   She is not having any chest discomfort.  No change in therapy.   HTN: Blood pressure is well-controlled.  No change in therapy.   DYSLIPIDEMIA: LDL was 64 with HDL of 59.  No change in therapy.    DM: A1c was elevated but she said she stopped taking her  glipizide because it was making her sick.  She has follow-up soon with [name], Elige Radon, MD to discuss.   LBBB:   This is chronic.  No change in therapy.\      Signed, Rollene Rotunda, MD

## 2022-07-10 ENCOUNTER — Ambulatory Visit: Payer: Medicare HMO | Admitting: Cardiology

## 2022-07-10 ENCOUNTER — Encounter: Payer: Self-pay | Admitting: Cardiology

## 2022-07-10 VITALS — BP 110/62 | HR 61 | Ht 63.0 in | Wt 155.0 lb

## 2022-07-10 DIAGNOSIS — R072 Precordial pain: Secondary | ICD-10-CM

## 2022-07-10 DIAGNOSIS — E785 Hyperlipidemia, unspecified: Secondary | ICD-10-CM | POA: Diagnosis not present

## 2022-07-10 DIAGNOSIS — E118 Type 2 diabetes mellitus with unspecified complications: Secondary | ICD-10-CM | POA: Diagnosis not present

## 2022-07-10 DIAGNOSIS — I10 Essential (primary) hypertension: Secondary | ICD-10-CM | POA: Diagnosis not present

## 2022-07-10 NOTE — Patient Instructions (Signed)
Medication Instructions:  The current medical regimen is effective;  continue present plan and medications.  *If you need a refill on your cardiac medications before your next appointment, please call your pharmacy*  Follow-Up: At [facility], you and your health needs are our priority.  As part of our continuing mission to provide you with exceptional heart care, we have created designated Provider Care Teams.  These Care Teams include your primary Cardiologist (physician) and Advanced Practice Providers (APPs -  Physician Assistants and Nurse Practitioners) who all work together to provide you with the care you need, when you need it.  We recommend signing up for the patient portal called "MyChart".  Sign up information is provided on this After Visit Summary.  MyChart is used to connect with patients for Virtual Visits (Telemedicine).  Patients are able to view lab/test results, encounter notes, upcoming appointments, etc.  Non-urgent messages can be sent to your provider as well.   To learn more about what you can do with MyChart, go to [email or web address].    Your next appointment:   1 year(s)  Provider:   James Hochrein, MD     

## 2022-07-12 ENCOUNTER — Ambulatory Visit: Payer: Self-pay | Admitting: *Deleted

## 2022-07-12 NOTE — Patient Outreach (Signed)
  Care Coordination   Follow Up Visit Note   07/12/2022 Name: Tonya Carpenter MRN: [id number] DOB: [birth year]  Tonya Carpenter is a 77 y.o. year old female who sees [name], Elige Radon, MD for primary care. I spoke with  Tonya Heater Pfohl by phone today.  What matters to the patients health and wellness today?  Confirms she she saw her cardiology on 07/11/22 Reports she thought "I had some problems but I was told everything was okay"  She reports having Diarrhea (chronic) for years. She reports taking omeprazole 40 mg qd She reports she was taken off  glipizide 5 mg bid By Robyne Askew when it was noted to cause her GI concerns. But was placed back on it and believes it is causing her to have diarrhea again   Confirm her pcp upcoming appointment 08/16/22. She wants to address the diarrhea possible related to Glipizide at this pcp visit Confirms she is walking for exercise    Goals Addressed               This Visit's Progress     Patient Stated     COMPLETED: Maintain Diabetes [facility]) (pt-stated)   Not on track     Care Coordination Interventions: Discussed plans with patient for ongoing care management follow up and provided patient with direct contact information for care management team Assessed social determinant of health barriers Last HgA1c = 8.7 on 04/19/22 was 6.8 on 01/31/22 up from 6.2 on 10/26/21 Inquired about any worsening changes, cbg values Duplicate goal      COMPLETED: memory, anxiety, home care resources & knowledge deficits ([facility]) (pt-stated)   On track     Care Coordination Interventions: Duplicate goal      Other     [facility] care coordination (diabetes, memory, anxiety, home care resources & knowledge deficits   On track             SDOH assessments and interventions completed:  No     Care Coordination Interventions:  Yes, provided   Follow up plan: Follow up call scheduled for 08/19/22 1045    Encounter Outcome:  Pt. Visit Completed   Javonda Suh L. Noelle Penner, RN, BSN,  CCM [facility] Care Management Community Coordinator Office number [phone]

## 2022-07-12 NOTE — Patient Instructions (Addendum)
Visit Information  Thank you for taking time to visit with me today. Please don't hesitate to contact me if I can be of assistance to you.   Following are the goals we discussed today:   Goals Addressed               This Visit's Progress     Patient Stated     COMPLETED: Maintain Diabetes ([facility]N) (pt-stated)   Not on track     Care Coordination Interventions: Discussed plans with patient for ongoing care management follow up and provided patient with direct contact information for care management team Assessed social determinant of health barriers Last HgA1c = 8.7 on 04/19/22 was 6.8 on 01/31/22 up from 6.2 on 10/26/21 Inquired about any worsening changes, cbg values Duplicate goal      COMPLETED: memory, anxiety, home care resources & knowledge deficits ([facility]N) (pt-stated)   On track     Care Coordination Interventions: Duplicate goal      Other     [facility] care coordination (diabetes, memory, anxiety, home care resources & knowledge deficits   On track      Interventions Today    Flowsheet Row Most Recent Value  Chronic Disease   Chronic disease during today's visit Diabetes, Other  [MD visits, Diarrhea, Questioning if glipizide causing diarrhea episodes, allowed her to ventilate]  General Interventions   General Interventions Discussed/Reviewed General Interventions Reviewed, Doctor Visits  Doctor Visits Discussed/Reviewed Doctor Visits Reviewed, PCP, Specialist  PCP/Specialist Visits Compliance with follow-up visit  Exercise Interventions   Exercise Discussed/Reviewed Exercise Discussed, Physical Activity  Physical Activity Discussed/Reviewed Types of exercise, Physical Activity Discussed, Home Exercise Program (HEP)  [walks everywhere she goes in [place]]  Education Interventions   Education Provided Provided Education  Provided Verbal Education On Other, Sick Day Rules  Higher education careers adviser, referred to pcp about Glipizide side effect concern]  Mental Health Interventions    Mental Health Discussed/Reviewed Mental Health Reviewed, Coping Strategies  Nutrition Interventions   Nutrition Discussed/Reviewed Nutrition Discussed, Fluid intake  Pharmacy Interventions   Pharmacy Dicussed/Reviewed Pharmacy Topics Discussed, Affording Medications  Safety Interventions   Safety Discussed/Reviewed Safety Reviewed, Home Safety  Home Safety Assistive Devices             Our next appointment is by telephone on 08/19/22 at 1045  Please call the care guide team at [phone] if you need to cancel or reschedule your appointment.   If you are experiencing a Mental Health or Behavioral Health Crisis or need someone to talk to, please call the Suicide and Crisis Lifeline: 988 call the [place] National Suicide Prevention Lifeline: (720) 087-2043 or TTY: 640-526-1888 TTY 951-505-0362) to talk to a trained counselor call 1-800-273-TALK (toll free, 24 hour hotline) call the [facility]: (626)484-2743 call 911   The patient verbalized understanding of instructions, educational materials, and care plan provided today and DECLINED offer to receive copy of patient instructions, educational materials, and care plan.   The patient has been provided with contact information for the care management team and has been advised to call with any health related questions or concerns.   Luvena Wentling L. Noelle Penner, RN, BSN, CCM [facility] Care Management Community Coordinator Office number [phone]

## 2022-07-20 ENCOUNTER — Other Ambulatory Visit: Payer: Self-pay | Admitting: Family Medicine

## 2022-07-20 DIAGNOSIS — N183 Chronic kidney disease, stage 3 unspecified: Secondary | ICD-10-CM

## 2022-07-20 DIAGNOSIS — E1169 Type 2 diabetes mellitus with other specified complication: Secondary | ICD-10-CM

## 2022-07-20 DIAGNOSIS — N1832 Chronic kidney disease, stage 3b: Secondary | ICD-10-CM

## 2022-08-08 ENCOUNTER — Other Ambulatory Visit: Payer: Self-pay | Admitting: Family Medicine

## 2022-08-08 DIAGNOSIS — E1159 Type 2 diabetes mellitus with other circulatory complications: Secondary | ICD-10-CM

## 2022-08-16 ENCOUNTER — Encounter: Payer: Self-pay | Admitting: Family Medicine

## 2022-08-16 ENCOUNTER — Ambulatory Visit (INDEPENDENT_AMBULATORY_CARE_PROVIDER_SITE_OTHER): Payer: Medicare HMO | Admitting: Family Medicine

## 2022-08-16 VITALS — BP 102/60 | HR 61 | Ht 63.0 in | Wt 152.0 lb

## 2022-08-16 DIAGNOSIS — E1169 Type 2 diabetes mellitus with other specified complication: Secondary | ICD-10-CM | POA: Diagnosis not present

## 2022-08-16 DIAGNOSIS — Z7984 Long term (current) use of oral hypoglycemic drugs: Secondary | ICD-10-CM

## 2022-08-16 DIAGNOSIS — E1159 Type 2 diabetes mellitus with other circulatory complications: Secondary | ICD-10-CM | POA: Diagnosis not present

## 2022-08-16 DIAGNOSIS — E1122 Type 2 diabetes mellitus with diabetic chronic kidney disease: Secondary | ICD-10-CM

## 2022-08-16 DIAGNOSIS — I152 Hypertension secondary to endocrine disorders: Secondary | ICD-10-CM | POA: Diagnosis not present

## 2022-08-16 DIAGNOSIS — I10 Essential (primary) hypertension: Secondary | ICD-10-CM

## 2022-08-16 DIAGNOSIS — N183 Chronic kidney disease, stage 3 unspecified: Secondary | ICD-10-CM | POA: Diagnosis not present

## 2022-08-16 DIAGNOSIS — E7800 Pure hypercholesterolemia, unspecified: Secondary | ICD-10-CM

## 2022-08-16 LAB — CBC WITH DIFFERENTIAL/PLATELET
Basophils Absolute: 0 10*3/uL (ref 0.0–0.2)
Basos: 0 %
EOS (ABSOLUTE): 0.1 10*3/uL (ref 0.0–0.4)
Eos: 1 %
Hematocrit: 33.9 % — ABNORMAL LOW (ref 34.0–46.6)
Hemoglobin: 10.6 g/dL — ABNORMAL LOW (ref 11.1–15.9)
Immature Grans (Abs): 0 10*3/uL (ref 0.0–0.1)
Immature Granulocytes: 0 %
Lymphocytes Absolute: 2.1 10*3/uL (ref 0.7–3.1)
Lymphs: 26 %
MCH: 26.6 pg (ref 26.6–33.0)
MCHC: 31.3 g/dL — ABNORMAL LOW (ref 31.5–35.7)
MCV: 85 fL (ref 79–97)
Monocytes Absolute: 0.5 10*3/uL (ref 0.1–0.9)
Monocytes: 6 %
Neutrophils Absolute: 5.4 10*3/uL (ref 1.4–7.0)
Neutrophils: 67 %
Platelets: 225 10*3/uL (ref 150–450)
RBC: 3.98 x10E6/uL (ref 3.77–5.28)
RDW: 12.6 % (ref 11.7–15.4)
WBC: 8.1 10*3/uL (ref 3.4–10.8)

## 2022-08-16 LAB — CMP14+EGFR
ALT: 13 IU/L (ref 0–32)
AST: 20 IU/L (ref 0–40)
Albumin/Globulin Ratio: 1.1 — ABNORMAL LOW (ref 1.2–2.2)
Albumin: 4 g/dL (ref 3.8–4.8)
Alkaline Phosphatase: 57 IU/L (ref 44–121)
BUN/Creatinine Ratio: 13 (ref 12–28)
BUN: 20 mg/dL (ref 8–27)
Bilirubin Total: 0.4 mg/dL (ref 0.0–1.2)
CO2: 22 mmol/L (ref 20–29)
Calcium: 9.8 mg/dL (ref 8.7–10.3)
Chloride: 104 mmol/L (ref 96–106)
Creatinine, Ser: 1.56 mg/dL — ABNORMAL HIGH (ref 0.57–1.00)
Globulin, Total: 3.6 g/dL (ref 1.5–4.5)
Glucose: 134 mg/dL — ABNORMAL HIGH (ref 70–99)
Potassium: 4.1 mmol/L (ref 3.5–5.2)
Sodium: 139 mmol/L (ref 134–144)
Total Protein: 7.6 g/dL (ref 6.0–8.5)
eGFR: 34 mL/min/{1.73_m2} — ABNORMAL LOW (ref >59)

## 2022-08-16 LAB — LIPID PANEL
Chol/HDL Ratio: 2.6 ratio (ref 0.0–4.4)
Cholesterol, Total: 134 mg/dL (ref 100–199)
HDL: 52 mg/dL (ref >39)
LDL Chol Calc (NIH): 63 mg/dL (ref 0–99)
Triglycerides: 101 mg/dL (ref 0–149)
VLDL Cholesterol Cal: 19 mg/dL (ref 5–40)

## 2022-08-16 LAB — BAYER DCA HB A1C WAIVED: HB A1C (BAYER DCA - WAIVED): 7.6 % — ABNORMAL HIGH (ref 4.8–5.6)

## 2022-08-16 NOTE — Progress Notes (Signed)
BP 102/60   Pulse 61   Ht 5\' 3"  (1.6 m)   Wt 152 lb (68.9 kg)   SpO2 100%   BMI 26.93 kg/m    Subjective:   Patient ID: Tonya Carpenter, female    DOB: 04/20/1945, 77 y.o.   MRN: [id number]  HPI: Tonya Carpenter is a 77 y.o. female presenting on 08/16/2022 for Medical Management of Chronic Issues, Diabetes, and Chronic Kidney Disease   HPI Type 2 diabetes mellitus Patient comes in today for recheck of his diabetes. Patient has been currently taking Janumet but says she is not taking the glipizide because she was concerned about stomach issues.. Patient is currently on an ACE inhibitor/ARB. Patient has not seen an ophthalmologist this year. Patient denies any new issues with their feet. The symptom started onset as an adult CKD and hyperlipidemia and hypertension ARE RELATED TO DM   Hypertension Patient is currently on hydralazine and losartan hydrochlorothiazide, and their blood pressure today is 102/60. Patient denies any lightheadedness or dizziness. Patient denies headaches, blurred vision, chest pains, shortness of breath, or weakness. Denies any side effects from medication and is content with current medication.   Hyperlipidemia Patient is coming in for recheck of his hyperlipidemia. The patient is currently taking Lipitor. They deny any issues with myalgias or history of liver damage from it. They deny any focal numbness or weakness or chest pain.   Relevant past medical, surgical, family and social history reviewed and updated as indicated. Interim medical history since our last visit reviewed. Allergies and medications reviewed and updated.  Review of Systems  Constitutional:  Negative for chills and fever.  Eyes:  Negative for visual disturbance.  Respiratory:  Negative for chest tightness and shortness of breath.   Cardiovascular:  Negative for chest pain and leg swelling.  Musculoskeletal:  Negative for back pain and gait problem.  Skin:  Negative for rash.  Neurological:   Negative for dizziness, light-headedness and headaches.  Psychiatric/Behavioral:  Negative for agitation and behavioral problems.   All other systems reviewed and are negative.   Per HPI unless specifically indicated above   Allergies as of 08/16/2022       Reactions   Trilyte [peg [id number] Bicarb-nacl]    Nausea/vomtiing        Medication List        Accurate as of Aug 16, 2022 11:20 AM. If you have any questions, ask your nurse or doctor.          Accu-Chek Guide w/Device Kit USE AS DIRECTED FOR DIABETES   Accu-Chek Softclix Lancets lancets TEST BLOOD SUGAR TWICE DAILY AND AS NEEDED FOR DIABETES   acetaminophen 500 MG tablet Commonly known as: TYLENOL Take 1,000 mg by mouth every 6 (six) hours as needed for mild pain or headache.   aspirin EC 81 MG tablet Commonly known as: Aspirin Low Dose TAKE 1 TABLET EVERY DAY - SWALLOW WHOLE   atorvastatin 20 MG tablet Commonly known as: LIPITOR Take 1 tablet (20 mg total) by mouth daily.   cholecalciferol 25 MCG (1000 UNIT) tablet Commonly known as: VITAMIN D3 TAKE 1 TABLET EVERY DAY   diclofenac Sodium 1 % Gel Commonly known as: VOLTAREN Apply 2 g topically 4 (four) times daily as needed.   DropSafe Alcohol Prep 70 % Pads TEST BLOOD SUGAR TWICE DAILY AND AS NEEDED   feeding supplement Liqd Take 237 mLs by mouth daily.   Fluad Quadrivalent 0.5 ML injection Generic drug: influenza vaccine adjuvanted  glipiZIDE 5 MG tablet Commonly known as: GLUCOTROL Take 1 tablet (5 mg total) by mouth 2 (two) times daily before a meal.   hydrALAZINE 10 MG tablet Commonly known as: APRESOLINE TAKE 1 TABLET TWICE DAILY   Janumet XR 50-500 MG Tb24 Generic drug: SitaGLIPtin-MetFORMIN HCl Take 1 tablet by mouth 2 (two) times daily.   losartan-hydrochlorothiazide 50-12.5 MG tablet Commonly known as: HYZAAR TAKE 1 TABLET EVERY DAY   metoprolol succinate 50 MG 24 hr tablet Commonly known as: TOPROL-XL TAKE 1 TABLET  (50 MG TOTAL) DAILY. TAKE WITH OR IMMEDIATELY FOLLOWING A MEAL.   nitroGLYCERIN 0.4 MG SL tablet Commonly known as: NITROSTAT DISSOLVE 1 TABLET UNDER THE TONGUE EVERY 5 MINUTES AS NEEDED FOR CHEST PAIN   omeprazole 40 MG capsule Commonly known as: PRILOSEC Take 1 capsule (40 mg total) by mouth daily.   polyethylene glycol 17 g packet Commonly known as: MIRALAX / GLYCOLAX Take 17 g by mouth daily as needed.   REFRESH RELIEVA OP Apply to eye.   True Metrix Blood Glucose Test test strip Generic drug: glucose blood TEST BLOOD SUGAR TWO TIMES DAILY AND AS NEEDED FOR DIABETES   True Metrix Level 1 Low Soln Use as needed with BS machine   TRUEdraw Lancing Device Misc Teset BS BID Dx E11.9         Objective:   BP 102/60   Pulse 61   Ht 5\' 3"  (1.6 m)   Wt 152 lb (68.9 kg)   SpO2 100%   BMI 26.93 kg/m   Wt Readings from Last 3 Encounters:  08/16/22 152 lb (68.9 kg)  07/10/22 155 lb (70.3 kg)  07/05/22 152 lb (68.9 kg)    Physical Exam Vitals and nursing note reviewed.  Constitutional:      General: She is not in acute distress.    Appearance: She is well-developed. She is not diaphoretic.  Eyes:     Conjunctiva/sclera: Conjunctivae normal.  Cardiovascular:     Rate and Rhythm: Normal rate and regular rhythm.     Heart sounds: Normal heart sounds. No murmur heard. Pulmonary:     Effort: Pulmonary effort is normal. No respiratory distress.     Breath sounds: Normal breath sounds. No wheezing.  Musculoskeletal:        General: No swelling or tenderness. Normal range of motion.  Skin:    General: Skin is warm and dry.     Findings: No rash.  Neurological:     Mental Status: She is alert and oriented to person, place, and time.     Coordination: Coordination normal.  Psychiatric:        Behavior: Behavior normal.       Assessment & Plan:   Problem List Items Addressed This Visit       Cardiovascular and Mediastinum   Essential hypertension      Endocrine   Type 2 diabetes mellitus (HCC) - Primary   Relevant Orders   CBC with Differential/Platelet   CMP14+EGFR   Lipid panel   Bayer DCA Hb A1c Waived   CKD stage 3 due to type 2 diabetes mellitus (HCC)     Other   Hyperlipidemia    A1c is better at 7.6.  No change in medication today, continue to focus on diet.  She says her biggest struggle is eating sweet lozenges to help when her mouth Follow up plan: Return in about 3 months (around 11/16/2022), or if symptoms worsen or fail to improve, for Diabetes recheck.  Counseling provided for all of the vaccine components Orders Placed This Encounter  Procedures   CBC with Differential/Platelet   CMP14+EGFR   Lipid panel   Bayer DCA Hb A1c Waived    Arville Care, MD [facility] 08/16/2022, 11:20 AM

## 2022-08-19 ENCOUNTER — Telehealth: Payer: Self-pay | Admitting: Family Medicine

## 2022-08-19 ENCOUNTER — Ambulatory Visit: Payer: Self-pay | Admitting: *Deleted

## 2022-08-19 NOTE — Patient Outreach (Addendum)
  Care Coordination   Follow Up Visit Note   08/19/2022 Name: Tonya Carpenter MRN: [id number] DOB: 05-14-1945  Tonya Carpenter is a 77 y.o. year old female who sees [name], Elige Radon, MD for primary care. I spoke with  Tonya Carpenter by phone today.  What matters to the patients health and wellness today?  Cramps in her hand & legs "but not all the time", want "someone to help me" to cook, errands eventually She refers to having [employer] (CAP) services "many years ago and my husband had someone" She confirmed she does not have full medicaid, only medicaid for medication assistance She voiced understanding that speaking with her pcp would lead to her completing medicaid application to verify if she qualifies for full medicaid services   She confirms she can not afford out of pocket expense for private duty personal care services  Diabetic shoes "cost so much"      Goals Addressed             This Visit's Progress    THN care coordination (diabetes, memory, anxiety, home care resources & knowledge deficits   On track    Interventions Today    Flowsheet Row Most Recent Value  Chronic Disease   Chronic disease during today's visit Diabetes, Other  [CAP/PCS via DSS]  General Interventions   General Interventions Discussed/Reviewed General Interventions Reviewed, Community Resources, Doctor Visits  Doctor Visits Discussed/Reviewed Doctor Visits Reviewed, PCP  PCP/Specialist Visits Compliance with follow-up visit  Exercise Interventions   Exercise Discussed/Reviewed Exercise Reviewed, Physical Activity  Physical Activity Discussed/Reviewed Physical Activity Reviewed, Home Exercise Program (HEP)  Education Interventions   Education Provided Provided Education  [Explained CAP services, full medicaid, encouraged to check with church members for possible volunteer assistance speaking with her pcp will lead to medicaid also  Discussed out of pocket personal care service & the  process of getting diabetic shoes]  Provided Verbal Education On Foot Care, Community Resources, When to see the doctor  Mental Health Interventions   Mental Health Discussed/Reviewed Mental Health Reviewed, Coping Strategies  Pharmacy Interventions   Pharmacy Dicussed/Reviewed Pharmacy Topics Reviewed, Affording Medications  Safety Interventions   Safety Discussed/Reviewed Home Safety          SDOH assessments and interventions completed:  No     Care Coordination Interventions:  Yes, provided   Follow up plan: Follow up call scheduled for 10/21/22    Encounter Outcome:  Pt. Visit Completed   Karalyn Kadel L. Noelle Penner, RN, BSN, CCM [facility] Care Management Community Coordinator Office number [phone]

## 2022-08-19 NOTE — Patient Instructions (Addendum)
Visit Information  Thank you for taking time to visit with me today. Please don't hesitate to contact me if I can be of assistance to you.   Following are the goals we discussed today:   Goals Addressed             This Visit's Progress    THN care coordination (diabetes, memory, anxiety, home care resources & knowledge deficits   On track    Interventions Today    Flowsheet Row Most Recent Value  Chronic Disease   Chronic disease during today's visit Diabetes, Other  [CAP/PCS via DSS]  General Interventions   General Interventions Discussed/Reviewed General Interventions Reviewed, Community Resources, Doctor Visits  Doctor Visits Discussed/Reviewed Doctor Visits Reviewed, PCP  PCP/Specialist Visits Compliance with follow-up visit  Exercise Interventions   Exercise Discussed/Reviewed Exercise Reviewed, Physical Activity  Physical Activity Discussed/Reviewed Physical Activity Reviewed, Home Exercise Program (HEP)  Education Interventions   Education Provided Provided Education  [Explained CAP services, full medicaid, encouraged to check with church members for possible volunteer assistance speaking with her pcp will lead to medicaid also  Discussed out of pocket personal care service & the process of getting diabetic shoes]  Provided Verbal Education On Foot Care, Community Resources, When to see the doctor  Mental Health Interventions   Mental Health Discussed/Reviewed Mental Health Reviewed, Coping Strategies  Pharmacy Interventions   Pharmacy Dicussed/Reviewed Pharmacy Topics Reviewed, Affording Medications  Safety Interventions   Safety Discussed/Reviewed Home Safety      Interventions Today    Flowsheet Row Most Recent Value  Chronic Disease   Chronic disease during today's visit Diabetes, Other  [CAP/PCS via DSS]  General Interventions   General Interventions Discussed/Reviewed General Interventions Reviewed, Community Resources, Doctor Visits  Doctor Visits  Discussed/Reviewed Doctor Visits Reviewed, PCP  PCP/Specialist Visits Compliance with follow-up visit  Exercise Interventions   Exercise Discussed/Reviewed Exercise Reviewed, Physical Activity  Physical Activity Discussed/Reviewed Physical Activity Reviewed, Home Exercise Program (HEP)  Education Interventions   Education Provided Provided Education  [Explained CAP services, full medicaid, encouraged to check with church members for possible volunteer assistance speaking with her pcp will lead to medicaid also  Discussed out of pocket personal care service & the process of getting diabetic shoes]  Provided Verbal Education On Foot Care, Community Resources, When to see the doctor  Mental Health Interventions   Mental Health Discussed/Reviewed Mental Health Reviewed, Coping Strategies  Pharmacy Interventions   Pharmacy Dicussed/Reviewed Pharmacy Topics Reviewed, Affording Medications  Safety Interventions   Safety Discussed/Reviewed Home Safety              Our next appointment is by telephone on 10/21/22 at 1045  Please call the care guide team at [phone] if you need to cancel or reschedule your appointment.   If you are experiencing a Mental Health or Behavioral Health Crisis or need someone to talk to, please call the Suicide and Crisis Lifeline: 988 call the [place] National Suicide Prevention Lifeline: 2624342201 or TTY: 409-519-8495 TTY (304)011-2595) to talk to a trained counselor call 1-800-273-TALK (toll free, 24 hour hotline) call the [facility]: 204-260-8357 call 911   The patient verbalized understanding of instructions, educational materials, and care plan provided today and DECLINED offer to receive copy of patient instructions, educational materials, and care plan.   The patient has been provided with contact information for the care management team and has been advised to call with any health related questions or concerns.   Charlene Cowdrey L.  Noelle Penner, RN, BSN, CCM [facility] Care Management Community Coordinator Office number [phone]

## 2022-08-20 NOTE — Telephone Encounter (Signed)
Pt made aware that [name] sent in a year supply in Feb on the 22nd. Informed pt that she should be due for a refill. Pt states that she has some left over from a previous Rx and she finished those first before the refill on 2/22. Pt made aware to call pharmacy about one week before she runs out to have the medication refilled. Pt agreed.

## 2022-08-21 DIAGNOSIS — H5203 Hypermetropia, bilateral: Secondary | ICD-10-CM | POA: Diagnosis not present

## 2022-08-21 DIAGNOSIS — H524 Presbyopia: Secondary | ICD-10-CM | POA: Diagnosis not present

## 2022-08-27 ENCOUNTER — Other Ambulatory Visit: Payer: Self-pay

## 2022-08-27 DIAGNOSIS — D649 Anemia, unspecified: Secondary | ICD-10-CM

## 2022-08-29 ENCOUNTER — Telehealth: Payer: Self-pay

## 2022-08-29 ENCOUNTER — Other Ambulatory Visit: Payer: Medicare HMO

## 2022-08-29 DIAGNOSIS — D649 Anemia, unspecified: Secondary | ICD-10-CM

## 2022-08-29 LAB — CBC WITH DIFFERENTIAL/PLATELET
Basophils Absolute: 0 10*3/uL (ref 0.0–0.2)
Basos: 0 %
EOS (ABSOLUTE): 0.1 10*3/uL (ref 0.0–0.4)
Eos: 1 %
Hematocrit: 35.4 % (ref 34.0–46.6)
Hemoglobin: 11.1 g/dL (ref 11.1–15.9)
Immature Grans (Abs): 0 10*3/uL (ref 0.0–0.1)
Immature Granulocytes: 0 %
Lymphocytes Absolute: 2.4 10*3/uL (ref 0.7–3.1)
Lymphs: 25 %
MCH: 27.3 pg (ref 26.6–33.0)
MCHC: 31.4 g/dL — ABNORMAL LOW (ref 31.5–35.7)
MCV: 87 fL (ref 79–97)
Monocytes Absolute: 0.6 10*3/uL (ref 0.1–0.9)
Monocytes: 7 %
Neutrophils Absolute: 6.6 10*3/uL (ref 1.4–7.0)
Neutrophils: 67 %
Platelets: 247 10*3/uL (ref 150–450)
RBC: 4.07 x10E6/uL (ref 3.77–5.28)
RDW: 12.8 % (ref 11.7–15.4)
WBC: 9.7 10*3/uL (ref 3.4–10.8)

## 2022-08-29 NOTE — Telephone Encounter (Signed)
Pt has been made aware and understood. She has no further concerns. 

## 2022-08-29 NOTE — Telephone Encounter (Signed)
The only thing that I could see glipizide causing is if she is having hypoglycemic episodes, when she feels like this please have her check her blood sugars.  Do not have her stop the glipizide yet but I want her to check her blood sugar whenever she feels this way.  If she is going to low then please let me know

## 2022-08-29 NOTE — Telephone Encounter (Signed)
Pt came in today stating that Glipizide is making her sick and makes her feel like she is drunk.  Pt wants to d/c the medication.

## 2022-08-30 ENCOUNTER — Telehealth: Payer: Self-pay | Admitting: Family Medicine

## 2022-08-30 NOTE — Telephone Encounter (Signed)
This is something that we would have to evaluate and order during her next visit, have her bring it up during her next visit.

## 2022-08-30 NOTE — Telephone Encounter (Signed)
Tried calling pt. The phone rung multiple times, no answer, no voicemail.

## 2022-08-30 NOTE — Telephone Encounter (Signed)
Pt called stating that she needs Dr [name] to order her some diabetic shoes and that she wears size 7.5

## 2022-09-02 ENCOUNTER — Telehealth: Payer: Self-pay | Admitting: Family Medicine

## 2022-09-02 NOTE — Telephone Encounter (Signed)
Pt is going to continue Janumet and hold Glipizide. Confirms that it is the Glipizide 5mg  that is making her sick. States that she will take it until appt in August until her appt here and then d/c. She will keep an eye on her blood sugars until then.

## 2022-09-02 NOTE — Telephone Encounter (Signed)
Informed pt that she should drink Glycerna instead.

## 2022-09-04 NOTE — Telephone Encounter (Signed)
Tried calling pt again. No answer and no vmail. If pt returns call please schedule an appt.  Since pt has been called twice, chart will be closed.

## 2022-09-06 ENCOUNTER — Other Ambulatory Visit: Payer: Self-pay | Admitting: Family Medicine

## 2022-09-06 DIAGNOSIS — K2100 Gastro-esophageal reflux disease with esophagitis, without bleeding: Secondary | ICD-10-CM

## 2022-09-12 ENCOUNTER — Other Ambulatory Visit: Payer: Self-pay | Admitting: *Deleted

## 2022-09-12 DIAGNOSIS — E1169 Type 2 diabetes mellitus with other specified complication: Secondary | ICD-10-CM

## 2022-09-12 MED ORDER — ATORVASTATIN CALCIUM 20 MG PO TABS: 20.0000 mg | ORAL_TABLET | Freq: Every day | ORAL | 0 refills | Status: DC

## 2022-09-16 ENCOUNTER — Telehealth: Payer: Self-pay | Admitting: Family Medicine

## 2022-09-16 NOTE — Telephone Encounter (Signed)
We have discussed this with the pt.

## 2022-09-17 ENCOUNTER — Other Ambulatory Visit: Payer: Self-pay | Admitting: Family Medicine

## 2022-09-17 ENCOUNTER — Telehealth: Payer: Self-pay | Admitting: Family Medicine

## 2022-09-18 ENCOUNTER — Other Ambulatory Visit: Payer: Self-pay

## 2022-09-18 NOTE — Telephone Encounter (Signed)
Advised patient that we removed glipizide from med list and she could discuss at upcoming appt. Patient verbalized undertstanding

## 2022-09-18 NOTE — Telephone Encounter (Signed)
Patient return call. ?

## 2022-09-18 NOTE — Telephone Encounter (Signed)
Okay then please take the glipizide off of her chart if she is not going to take it and we will have to discuss other options in the future, and she probably should avoid the chocolate shakes then.  Will look at her blood sugars when she comes back.

## 2022-09-18 NOTE — Telephone Encounter (Signed)
Glipizide discontinued from patient chart

## 2022-10-21 ENCOUNTER — Ambulatory Visit: Payer: Self-pay | Admitting: *Deleted

## 2022-10-21 NOTE — Patient Outreach (Signed)
  Care Coordination   10/21/2022 Name: Tonya Carpenter MRN: [id number] DOB: [birth year]   Care Coordination Outreach Attempts:  An unsuccessful telephone outreach was attempted for a scheduled appointment today.  Follow Up Plan:  Additional outreach attempts will be made to offer the patient care coordination information and services.   Encounter Outcome:  No Answer   Care Coordination Interventions:  No, not indicated     L. Noelle Penner, RN, BSN, CCM [facility] Care Management Community Coordinator Office number [phone]

## 2022-10-27 ENCOUNTER — Other Ambulatory Visit: Payer: Self-pay | Admitting: Family Medicine

## 2022-10-30 ENCOUNTER — Ambulatory Visit: Payer: Self-pay | Admitting: *Deleted

## 2022-10-30 ENCOUNTER — Encounter: Payer: Self-pay | Admitting: *Deleted

## 2022-10-30 NOTE — Patient Outreach (Signed)
  Care Coordination   Follow Up Visit Note   10/30/2022 Name: SAHIB SHANKLES MRN: [id number] DOB: June 17, 1945  Garry Heater Nathanson is a 77 y.o. year old female who sees [name], Elige Radon, MD for primary care. I spoke with  Garry Heater [place] by phone today.  What matters to the patients health and wellness today?  Follow up appointments, flu shot, eye exam  Mrs Brower denies any medical or social determinants of health (SDOH) concerns today     Goals Addressed             This Visit's Progress    THN care coordination (diabetes, memory, anxiety, home care resources & knowledge deficits       Interventions Today    Flowsheet Row Most Recent Value  Chronic Disease   Chronic disease during today's visit Diabetes, Other  [CAP/PCS via DSS]  General Interventions   General Interventions Discussed/Reviewed General Interventions Reviewed, Community Resources, Doctor Visits  Doctor Visits Discussed/Reviewed Doctor Visits Reviewed, PCP  PCP/Specialist Visits Compliance with follow-up visit  Exercise Interventions   Exercise Discussed/Reviewed Exercise Reviewed, Physical Activity  Physical Activity Discussed/Reviewed Physical Activity Reviewed, Home Exercise Program (HEP)  Education Interventions   Education Provided Provided Education  [Explained CAP services, full medicaid, encouraged to check with church members for possible volunteer assistance speaking with her pcp will lead to medicaid also  Discussed out of pocket personal care service & the process of getting diabetic shoes]  Provided Verbal Education On Foot Care, Community Resources, When to see the doctor  Mental Health Interventions   Mental Health Discussed/Reviewed Mental Health Reviewed, Coping Strategies  Pharmacy Interventions   Pharmacy Dicussed/Reviewed Pharmacy Topics Reviewed, Affording Medications  Safety Interventions   Safety Discussed/Reviewed Home Safety      Interventions Today    Flowsheet Row Most Recent Value  Chronic  Disease   Chronic disease during today's visit Diabetes, Other  [CAP/PCS via DSS]  General Interventions   General Interventions Discussed/Reviewed General Interventions Reviewed, Community Resources, Doctor Visits  Doctor Visits Discussed/Reviewed Doctor Visits Reviewed, PCP  PCP/Specialist Visits Compliance with follow-up visit  Exercise Interventions   Exercise Discussed/Reviewed Exercise Reviewed, Physical Activity  Physical Activity Discussed/Reviewed Physical Activity Reviewed, Home Exercise Program (HEP)  Education Interventions   Education Provided Provided Education  [Explained CAP services, full medicaid, encouraged to check with church members for possible volunteer assistance speaking with her pcp will lead to medicaid also  Discussed out of pocket personal care service & the process of getting diabetic shoes]  Provided Verbal Education On Foot Care, Community Resources, When to see the doctor  Mental Health Interventions   Mental Health Discussed/Reviewed Mental Health Reviewed, Coping Strategies  Pharmacy Interventions   Pharmacy Dicussed/Reviewed Pharmacy Topics Reviewed, Affording Medications  Safety Interventions   Safety Discussed/Reviewed Home Safety              SDOH assessments and interventions completed:  Yes     Care Coordination Interventions:  Yes, provided   Follow up plan: Follow up call scheduled for 01/30/23    Encounter Outcome:  Pt. Visit Completed     L. Noelle Penner, RN, BSN, CCM [facility] Care Management Community Coordinator Office number [phone]

## 2022-10-30 NOTE — Patient Instructions (Signed)
Visit Information  Thank you for taking time to visit with me today. Please don't hesitate to contact me if I can be of assistance to you.   Following are the goals we discussed today:   Goals Addressed             This Visit's Progress    [facility]N care coordination (diabetes, memory, anxiety, home care resources & knowledge deficits       Interventions Today    Flowsheet Row Most Recent Value  Chronic Disease   Chronic disease during today's visit Diabetes, Other  [CAP/PCS via DSS]  General Interventions   General Interventions Discussed/Reviewed General Interventions Reviewed, Community Resources, Doctor Visits  Doctor Visits Discussed/Reviewed Doctor Visits Reviewed, PCP  PCP/Specialist Visits Compliance with follow-up visit  Exercise Interventions   Exercise Discussed/Reviewed Exercise Reviewed, Physical Activity  Physical Activity Discussed/Reviewed Physical Activity Reviewed, Home Exercise Program (HEP)  Education Interventions   Education Provided Provided Education  [Explained CAP services, full medicaid, encouraged to check with church members for possible volunteer assistance speaking with her pcp will lead to medicaid also  Discussed out of pocket personal care service & the process of getting diabetic shoes]  Provided Verbal Education On Foot Care, Community Resources, When to see the doctor  Mental Health Interventions   Mental Health Discussed/Reviewed Mental Health Reviewed, Coping Strategies  Pharmacy Interventions   Pharmacy Dicussed/Reviewed Pharmacy Topics Reviewed, Affording Medications  Safety Interventions   Safety Discussed/Reviewed Home Safety      Interventions Today    Flowsheet Row Most Recent Value  Chronic Disease   Chronic disease during today's visit Diabetes, Other  [CAP/PCS via DSS]  General Interventions   General Interventions Discussed/Reviewed General Interventions Reviewed, Community Resources, Doctor Visits  Doctor Visits  Discussed/Reviewed Doctor Visits Reviewed, PCP  PCP/Specialist Visits Compliance with follow-up visit  Exercise Interventions   Exercise Discussed/Reviewed Exercise Reviewed, Physical Activity  Physical Activity Discussed/Reviewed Physical Activity Reviewed, Home Exercise Program (HEP)  Education Interventions   Education Provided Provided Education  [Explained CAP services, full medicaid, encouraged to check with church members for possible volunteer assistance speaking with her pcp will lead to medicaid also  Discussed out of pocket personal care service & the process of getting diabetic shoes]  Provided Verbal Education On Foot Care, Community Resources, When to see the doctor  Mental Health Interventions   Mental Health Discussed/Reviewed Mental Health Reviewed, Coping Strategies  Pharmacy Interventions   Pharmacy Dicussed/Reviewed Pharmacy Topics Reviewed, Affording Medications  Safety Interventions   Safety Discussed/Reviewed Home Safety              Our next appointment is by telephone on 01/30/23 at 1030  Please call the care guide team at [phone] if you need to cancel or reschedule your appointment.   If you are experiencing a Mental Health or Behavioral Health Crisis or need someone to talk to, please call the Suicide and Crisis Lifeline: 988 call the [place] National Suicide Prevention Lifeline: 419-153-3266 or TTY: (281)260-3882 TTY 361-147-1193) to talk to a trained counselor call 1-800-273-TALK (toll free, 24 hour hotline) call the [facility]: 865-688-3068 call 911   No computer access, no preference for copy of AVS         The patient has been provided with contact information for the care management team and has been advised to call with any health related questions or concerns.    L. Noelle Penner, RN, BSN, CCM [facility] Care Management Community Coordinator Office number [phone]

## 2022-11-08 ENCOUNTER — Telehealth: Payer: Self-pay | Admitting: Family Medicine

## 2022-11-08 MED ORDER — VITAMIN D3 25 MCG (1000 UNIT) PO TABS: 1000.0000 [IU] | ORAL_TABLET | Freq: Every day | ORAL | 2 refills | Status: DC

## 2022-11-08 NOTE — Telephone Encounter (Signed)
Not on current med list.

## 2022-11-08 NOTE — Telephone Encounter (Signed)
  Prescription Request  11/08/2022  Is this a "Controlled Substance" medicine? cholecalciferol (VITAMIN D3) 25 MCG (1000 UNIT) tablet   Have you seen your PCP in the last 2 weeks? no  If YES, route message to pool  -  If NO, patient needs to be scheduled for appointment.  What is the name of the medication or equipment? cholecalciferol (VITAMIN D3) 25 MCG (1000 UNIT) tablet   Have you contacted your pharmacy to request a refill? no   Which pharmacy would you like this sent to? [facility]   Patient notified that their request is being sent to the clinical staff for review and that they should receive a response within 2 business days.

## 2022-11-08 NOTE — Telephone Encounter (Signed)
Sent vitamin D prescription in for the patient

## 2022-11-08 NOTE — Telephone Encounter (Signed)
Patient aware and verbalizes understanding. 

## 2022-11-21 ENCOUNTER — Ambulatory Visit (INDEPENDENT_AMBULATORY_CARE_PROVIDER_SITE_OTHER): Payer: Medicare HMO | Admitting: Family Medicine

## 2022-11-21 ENCOUNTER — Ambulatory Visit: Payer: Medicare HMO

## 2022-11-21 ENCOUNTER — Other Ambulatory Visit: Payer: Self-pay | Admitting: *Deleted

## 2022-11-21 ENCOUNTER — Encounter: Payer: Self-pay | Admitting: Family Medicine

## 2022-11-21 VITALS — BP 105/58 | HR 58 | Ht 63.0 in | Wt 153.0 lb

## 2022-11-21 DIAGNOSIS — N183 Chronic kidney disease, stage 3 unspecified: Secondary | ICD-10-CM

## 2022-11-21 DIAGNOSIS — E1169 Type 2 diabetes mellitus with other specified complication: Secondary | ICD-10-CM

## 2022-11-21 DIAGNOSIS — E785 Hyperlipidemia, unspecified: Secondary | ICD-10-CM

## 2022-11-21 DIAGNOSIS — E1159 Type 2 diabetes mellitus with other circulatory complications: Secondary | ICD-10-CM

## 2022-11-21 DIAGNOSIS — E1122 Type 2 diabetes mellitus with diabetic chronic kidney disease: Secondary | ICD-10-CM

## 2022-11-21 DIAGNOSIS — I129 Hypertensive chronic kidney disease with stage 1 through stage 4 chronic kidney disease, or unspecified chronic kidney disease: Secondary | ICD-10-CM | POA: Diagnosis not present

## 2022-11-21 DIAGNOSIS — E7800 Pure hypercholesterolemia, unspecified: Secondary | ICD-10-CM

## 2022-11-21 DIAGNOSIS — I10 Essential (primary) hypertension: Secondary | ICD-10-CM

## 2022-11-21 DIAGNOSIS — Z7984 Long term (current) use of oral hypoglycemic drugs: Secondary | ICD-10-CM | POA: Diagnosis not present

## 2022-11-21 DIAGNOSIS — N1832 Chronic kidney disease, stage 3b: Secondary | ICD-10-CM

## 2022-11-21 DIAGNOSIS — K2100 Gastro-esophageal reflux disease with esophagitis, without bleeding: Secondary | ICD-10-CM

## 2022-11-21 LAB — CMP14+EGFR
ALT: 14 IU/L (ref 0–32)
AST: 22 IU/L (ref 0–40)
Albumin: 4.2 g/dL (ref 3.8–4.8)
Alkaline Phosphatase: 62 IU/L (ref 44–121)
BUN/Creatinine Ratio: 15 (ref 12–28)
BUN: 23 mg/dL (ref 8–27)
Bilirubin Total: 0.6 mg/dL (ref 0.0–1.2)
CO2: 23 mmol/L (ref 20–29)
Calcium: 10 mg/dL (ref 8.7–10.3)
Chloride: 100 mmol/L (ref 96–106)
Creatinine, Ser: 1.58 mg/dL — ABNORMAL HIGH (ref 0.57–1.00)
Globulin, Total: 3.7 g/dL (ref 1.5–4.5)
Glucose: 132 mg/dL — ABNORMAL HIGH (ref 70–99)
Potassium: 4.1 mmol/L (ref 3.5–5.2)
Sodium: 137 mmol/L (ref 134–144)
Total Protein: 7.9 g/dL (ref 6.0–8.5)
eGFR: 34 mL/min/{1.73_m2} — ABNORMAL LOW (ref >59)

## 2022-11-21 LAB — LIPID PANEL
Chol/HDL Ratio: 2.5 ratio (ref 0.0–4.4)
Cholesterol, Total: 129 mg/dL (ref 100–199)
HDL: 51 mg/dL (ref >39)
LDL Chol Calc (NIH): 57 mg/dL (ref 0–99)
Triglycerides: 117 mg/dL (ref 0–149)
VLDL Cholesterol Cal: 21 mg/dL (ref 5–40)

## 2022-11-21 LAB — CBC WITH DIFFERENTIAL/PLATELET
Basophils Absolute: 0 10*3/uL (ref 0.0–0.2)
Basos: 0 %
EOS (ABSOLUTE): 0.1 10*3/uL (ref 0.0–0.4)
Eos: 1 %
Hematocrit: 35.6 % (ref 34.0–46.6)
Hemoglobin: 11.1 g/dL (ref 11.1–15.9)
Immature Grans (Abs): 0 10*3/uL (ref 0.0–0.1)
Immature Granulocytes: 0 %
Lymphocytes Absolute: 2.4 10*3/uL (ref 0.7–3.1)
Lymphs: 26 %
MCH: 26.9 pg (ref 26.6–33.0)
MCHC: 31.2 g/dL — ABNORMAL LOW (ref 31.5–35.7)
MCV: 86 fL (ref 79–97)
Monocytes Absolute: 0.7 10*3/uL (ref 0.1–0.9)
Monocytes: 7 %
Neutrophils Absolute: 6.2 10*3/uL (ref 1.4–7.0)
Neutrophils: 66 %
Platelets: 272 10*3/uL (ref 150–450)
RBC: 4.12 x10E6/uL (ref 3.77–5.28)
RDW: 12.6 % (ref 11.7–15.4)
WBC: 9.4 10*3/uL (ref 3.4–10.8)

## 2022-11-21 LAB — BAYER DCA HB A1C WAIVED: HB A1C (BAYER DCA - WAIVED): 7.6 % — ABNORMAL HIGH (ref 4.8–5.6)

## 2022-11-21 MED ORDER — OMEPRAZOLE 40 MG PO CPDR: 40.0000 mg | DELAYED_RELEASE_CAPSULE | Freq: Every day | ORAL | 3 refills | Status: DC

## 2022-11-21 MED ORDER — JANUMET XR 50-500 MG PO TB24: 1.0000 | ORAL_TABLET | Freq: Two times a day (BID) | ORAL | 0 refills | Status: DC

## 2022-11-21 MED ORDER — ATORVASTATIN CALCIUM 20 MG PO TABS: 20.0000 mg | ORAL_TABLET | Freq: Every day | ORAL | 3 refills | Status: DC

## 2022-11-21 MED ORDER — JANUMET XR 50-500 MG PO TB24: 1.0000 | ORAL_TABLET | Freq: Two times a day (BID) | ORAL | 3 refills | Status: DC

## 2022-11-21 NOTE — Telephone Encounter (Signed)
[name] NTBS in 3 mos FU was to be Sept. RF sent to pharmacy

## 2022-11-21 NOTE — Telephone Encounter (Signed)
Pt has appt on 11/21/22

## 2022-11-21 NOTE — Progress Notes (Signed)
BP (!) 105/58   Pulse (!) 58   Ht 5\' 3"  (1.6 m)   Wt 153 lb (69.4 kg)   SpO2 96%   BMI 27.10 kg/m    Subjective:   Patient ID: Tonya Carpenter, female    DOB: [birth year], 77 y.o.   MRN: [id number]  HPI: Tonya Carpenter is a 77 y.o. female presenting on 11/21/2022 for Medical Management of Chronic Issues, Hyperlipidemia, Hypertension, and Diabetes   HPI Type 2 diabetes mellitus Patient comes in today for recheck of his diabetes. Patient has been currently taking Janumet. Patient is currently on an ACE inhibitor/ARB. Patient has not seen an ophthalmologist this year. Patient denies any new issues with their feet. The symptom started onset as an adult CAD and CKD and hypertension and hyperlipidemia recheck ARE RELATED TO DM   Hypertension Patient is currently on hydralazine and losartan hydrochlorothiazide and metoprolol, and their blood pressure today is 105/58. Patient denies any lightheadedness or dizziness. Patient denies headaches, blurred vision, chest pains, shortness of breath, or weakness. Denies any side effects from medication and is content with current medication.   Hyperlipidemia and CAD recheck Patient is coming in for recheck of his hyperlipidemia. The patient is currently taking atorvastatin. They deny any issues with myalgias or history of liver damage from it. They deny any focal numbness or weakness or chest pain.   Relevant past medical, surgical, family and social history reviewed and updated as indicated. Interim medical history since our last visit reviewed. Allergies and medications reviewed and updated.  Review of Systems  Constitutional:  Negative for chills and fever.  Eyes:  Negative for visual disturbance.  Respiratory:  Negative for chest tightness and shortness of breath.   Cardiovascular:  Negative for chest pain and leg swelling.  Musculoskeletal:  Negative for back pain and gait problem.  Skin:  Negative for rash.  Neurological:  Negative for dizziness,  light-headedness and headaches.  Psychiatric/Behavioral:  Negative for agitation and behavioral problems.   All other systems reviewed and are negative.   Per HPI unless specifically indicated above   Allergies as of 11/21/2022       Reactions   Trilyte [peg [id number] Bicarb-nacl]    Nausea/vomtiing        Medication List        Accurate as of November 21, 2022 10:25 AM. If you have any questions, ask your nurse or doctor.          Accu-Chek Guide w/Device Kit USE AS DIRECTED FOR DIABETES   Accu-Chek Softclix Lancets lancets TEST BLOOD SUGAR TWICE DAILY AND AS NEEDED FOR DIABETES   acetaminophen 500 MG tablet Commonly known as: TYLENOL Take 1,000 mg by mouth every 6 (six) hours as needed for mild pain or headache.   aspirin EC 81 MG tablet Commonly known as: Aspirin Low Dose TAKE 1 TABLET EVERY DAY - SWALLOW WHOLE   atorvastatin 20 MG tablet Commonly known as: LIPITOR Take 1 tablet (20 mg total) by mouth daily.   cholecalciferol 25 MCG (1000 UNIT) tablet Commonly known as: VITAMIN D3 Take 1 tablet (1,000 Units total) by mouth daily.   diclofenac Sodium 1 % Gel Commonly known as: VOLTAREN Apply 2 g topically 4 (four) times daily as needed.   DropSafe Alcohol Prep 70 % Pads TEST BS BID and as needed Dx E11.9   feeding supplement Liqd Take 237 mLs by mouth daily.   Fluad Quadrivalent 0.5 ML injection Generic drug: influenza vaccine adjuvanted   hydrALAZINE  10 MG tablet Commonly known as: APRESOLINE TAKE 1 TABLET TWICE DAILY   Janumet XR 50-500 MG Tb24 Generic drug: SitaGLIPtin-MetFORMIN HCl Take 1 tablet by mouth 2 (two) times daily.   losartan-hydrochlorothiazide 50-12.5 MG tablet Commonly known as: HYZAAR TAKE 1 TABLET EVERY DAY   metoprolol succinate 50 MG 24 hr tablet Commonly known as: TOPROL-XL TAKE 1 TABLET (50 MG TOTAL) DAILY. TAKE WITH OR IMMEDIATELY FOLLOWING A MEAL.   nitroGLYCERIN 0.4 MG SL tablet Commonly known as:  NITROSTAT DISSOLVE 1 TABLET UNDER THE TONGUE EVERY 5 MINUTES AS NEEDED FOR CHEST PAIN   omeprazole 40 MG capsule Commonly known as: PRILOSEC TAKE 1 CAPSULE EVERY DAY   polyethylene glycol 17 g packet Commonly known as: MIRALAX / GLYCOLAX Take 17 g by mouth daily as needed.   REFRESH RELIEVA OP Apply to eye.   True Metrix Blood Glucose Test test strip Generic drug: glucose blood TEST BLOOD SUGAR TWO TIMES DAILY AND AS NEEDED FOR DIABETES   True Metrix Level 1 Low Soln Use as needed with BS machine   TRUEdraw Lancing Device Misc Teset BS BID Dx E11.9         Objective:   BP (!) 105/58   Pulse (!) 58   Ht 5\' 3"  (1.6 m)   Wt 153 lb (69.4 kg)   SpO2 96%   BMI 27.10 kg/m   Wt Readings from Last 3 Encounters:  11/21/22 153 lb (69.4 kg)  08/16/22 152 lb (68.9 kg)  07/10/22 155 lb (70.3 kg)    Physical Exam Vitals and nursing note reviewed.  Constitutional:      General: She is not in acute distress.    Appearance: She is well-developed. She is not diaphoretic.  Eyes:     Conjunctiva/sclera: Conjunctivae normal.     Pupils: Pupils are equal, round, and reactive to light.  Cardiovascular:     Rate and Rhythm: Normal rate and regular rhythm.     Heart sounds: Normal heart sounds. No murmur heard. Pulmonary:     Effort: Pulmonary effort is normal. No respiratory distress.     Breath sounds: Normal breath sounds. No wheezing.  Musculoskeletal:        General: No tenderness. Normal range of motion.  Skin:    General: Skin is warm and dry.     Findings: No rash.  Neurological:     Mental Status: She is alert and oriented to person, place, and time.     Coordination: Coordination normal.  Psychiatric:        Behavior: Behavior normal.       Assessment & Plan:   Problem List Items Addressed This Visit       Cardiovascular and Mediastinum   Essential hypertension   Relevant Medications   atorvastatin (LIPITOR) 20 MG tablet     Digestive   GERD  (gastroesophageal reflux disease)   Relevant Medications   omeprazole (PRILOSEC) 40 MG capsule     Endocrine   Type 2 diabetes mellitus (HCC) - Primary   Relevant Medications   JANUMET XR 50-500 MG TB24   atorvastatin (LIPITOR) 20 MG tablet   Other Relevant Orders   CBC with Differential/Platelet   CMP14+EGFR   Lipid panel   Bayer DCA Hb A1c Waived   CKD stage 3 due to type 2 diabetes mellitus (HCC)   Relevant Medications   JANUMET XR 50-500 MG TB24   atorvastatin (LIPITOR) 20 MG tablet     Other   Hyperlipidemia   Relevant  Medications   atorvastatin (LIPITOR) 20 MG tablet   Other Visit Diagnoses     Hyperlipidemia associated with type 2 diabetes mellitus (HCC)       Relevant Medications   JANUMET XR 50-500 MG TB24   atorvastatin (LIPITOR) 20 MG tablet       A1c up slightly at 7.6, likely because stop the glipizide.  Will focus on diet at this point rather than add something back.  Blood pressure on the lower side but denies lightheadedness or dizziness. Follow up plan: Return in about 3 months (around 02/20/2023), or if symptoms worsen or fail to improve, for Diabetes.  Counseling provided for all of the vaccine components Orders Placed This Encounter  Procedures   CBC with Differential/Platelet   CMP14+EGFR   Lipid panel   Bayer DCA Hb A1c Waived    Arville Care, MD [place] [facility] 11/21/2022, 10:25 AM

## 2022-12-04 ENCOUNTER — Other Ambulatory Visit: Payer: Self-pay | Admitting: Family Medicine

## 2022-12-09 ENCOUNTER — Ambulatory Visit (INDEPENDENT_AMBULATORY_CARE_PROVIDER_SITE_OTHER): Payer: Medicare HMO

## 2022-12-09 DIAGNOSIS — Z23 Encounter for immunization: Secondary | ICD-10-CM | POA: Diagnosis not present

## 2022-12-23 ENCOUNTER — Other Ambulatory Visit: Payer: Self-pay | Admitting: Family Medicine

## 2022-12-23 DIAGNOSIS — I152 Hypertension secondary to endocrine disorders: Secondary | ICD-10-CM

## 2023-01-02 ENCOUNTER — Other Ambulatory Visit: Payer: Self-pay | Admitting: Family Medicine

## 2023-01-02 DIAGNOSIS — E1159 Type 2 diabetes mellitus with other circulatory complications: Secondary | ICD-10-CM

## 2023-01-22 ENCOUNTER — Other Ambulatory Visit: Payer: Self-pay | Admitting: Family Medicine

## 2023-01-22 MED ORDER — DICLOFENAC SODIUM 1 % EX GEL: 2.0000 g | Freq: Four times a day (QID) | CUTANEOUS | 5 refills | Status: DC | PRN

## 2023-01-22 NOTE — Telephone Encounter (Signed)
Copied from CRM [id number]. Topic: Clinical - Medication Refill >> Jan 22, 2023  2:07 PM Almira Coaster wrote: Most Recent Primary Care Visit:  Provider: [facility]  Department: [facility] MED  Visit Type: FLU SHOT  Date: 12/09/2022  Medication: ***  Has the patient contacted their pharmacy?  (Agent: If no, request that the patient contact the pharmacy for the refill. If patient does not wish to contact the pharmacy document the reason why and proceed with request.) (Agent: If yes, when and what did the pharmacy advise?)  Is this the correct pharmacy for this prescription?  If no, delete pharmacy and type the correct one.  This is the patient's preferred pharmacy:  [facility] [place], [place] - [address] [place] [address] Phone: [phone] Fax: [phone]  [facility] - [place], [place] - [address] [place] [address] Phone: [phone] Fax: [phone]   Has the prescription been filled recently?   Is the patient out of the medication?   Has the patient been seen for an appointment in the last year OR does the patient have an upcoming appointment?   Can we respond through MyChart?   Agent: Please be advised that Rx refills may take up to 3 business days. We ask that you follow-up with your pharmacy.

## 2023-01-30 ENCOUNTER — Encounter: Payer: Self-pay | Admitting: *Deleted

## 2023-02-05 ENCOUNTER — Ambulatory Visit: Payer: Self-pay | Admitting: *Deleted

## 2023-02-05 NOTE — Patient Instructions (Signed)
Visit Information  Thank you for taking time to visit with me today. Please don't hesitate to contact me if I can be of assistance to you.   Following are the goals we discussed today:   Goals Addressed             This Visit's Progress    care coordination (diabetes, memory, anxiety, home care resources & knowledge deficits       Patient will continue to attend her medical appointments as scheduled Patient will avoid conflict with other apartment residents for safety reasons & to maintain housing    Interventions Today    Flowsheet Row Most Recent Value  Chronic Disease   Chronic disease during today's visit --  [Encouraged her to stop her encounter/interaction, memory]  General Interventions   General Interventions Discussed/Reviewed General Interventions Reviewed, Doctor Visits  Doctor Visits Discussed/Reviewed Doctor Visits Reviewed, PCP  PCP/Specialist Visits Compliance with follow-up visit  [Discussed further Care coordination telephone calls should come from [facility] T RN CCM]  Exercise Interventions   Exercise Discussed/Reviewed Exercise Reviewed, Physical Activity  [walking around her apartment complex]  Physical Activity Discussed/Reviewed Physical Activity Reviewed, Home Exercise Program (HEP)  Education Interventions   Education Provided Provided Education  [negative social interaction causing increase blood pressure, the importance of monitoring her blood pressure at home]  Provided Verbal Education On Exercise, Mental Health/Coping with Illness  Mental Health Interventions   Mental Health Discussed/Reviewed Mental Health Reviewed, Coping Strategies, Other  Safety Interventions   Safety Discussed/Reviewed Safety Reviewed, Home Safety  [safety in her apartment complex]      Interventions Today    Flowsheet Row Most Recent Value  Chronic Disease   Chronic disease during today's visit --  [Encouraged her to stop her encounter/interaction, memory]  General  Interventions   General Interventions Discussed/Reviewed General Interventions Reviewed, Doctor Visits  Doctor Visits Discussed/Reviewed Doctor Visits Reviewed, PCP  PCP/Specialist Visits Compliance with follow-up visit  [Discussed further Care coordination telephone calls should come from [facility] T RN CCM]  Exercise Interventions   Exercise Discussed/Reviewed Exercise Reviewed, Physical Activity  [walking around her apartment complex]  Physical Activity Discussed/Reviewed Physical Activity Reviewed, Home Exercise Program (HEP)  Education Interventions   Education Provided Provided Education  [negative social interaction causing increase blood pressure, the importance of monitoring her blood pressure at home]  Provided Verbal Education On Exercise, Mental Health/Coping with Illness  Mental Health Interventions   Mental Health Discussed/Reviewed Mental Health Reviewed, Coping Strategies, Other  Safety Interventions   Safety Discussed/Reviewed Safety Reviewed, Home Safety  [safety in her apartment complex]              Our next appointment is by telephone on ? at ? Pending outreach from another RN CCM  Please call the care guide team at [phone] if you need to cancel or reschedule your appointment.   If you are experiencing a Mental Health or Behavioral Health Crisis or need someone to talk to, please    No computer access, no preference for copy of AVS    The patient has been provided with contact information for the care management team and has been advised to call with any health related questions or concerns.    Quanasia Defino L. Noelle Penner, RN, BSN, [facility]  [facility] Care Management Coordinator  [phone]  Fax: [phone]

## 2023-02-05 NOTE — Patient Outreach (Signed)
Care Coordination   Follow Up Visit Note   02/05/2023 Name: Tonya Carpenter MRN: [id number] DOB: 04-08-1945  Tonya Carpenter is a 77 y.o. year old female who sees [name], Elige Radon, MD for primary care. I spoke with  Tonya Carpenter by phone today.  What matters to the patients health and wellness today?  Concern with other residents in her complex she encounters when exercising who have reported her to the complex manager. Other resident does not want patient near her residence but patient at times walks near the resident's apartment while exercising. She has confirmed she has voiced an opinion about the residents staying in the apartment that was not received well. Her son is staying with her presently   She voiced understanding to stop her interaction with the residents she is having conflict with. She agrees to find other places to complete her exercises  She discussed home care services related to her memory concerns but changed her mind when she recall having to pay for home care services.  She confirms she has partial medicaid that does not cover personal care home services.   An attempt to help her complete a new medicaid application was not successful late 2023 nor early 2024.   Goals Addressed             This Visit's Progress    care coordination (diabetes, memory, anxiety, home care resources & knowledge deficits       Patient will continue to attend her medical appointments as scheduled Patient will avoid conflict with other apartment residents for safety reasons & to maintain housing    Interventions Today    Flowsheet Row Most Recent Value  Chronic Disease   Chronic disease during today's visit --  [Encouraged her to stop her encounter/interaction, memory]  General Interventions   General Interventions Discussed/Reviewed General Interventions Reviewed, Doctor Visits  Doctor Visits Discussed/Reviewed Doctor Visits Reviewed, PCP  PCP/Specialist Visits Compliance with follow-up  visit  [Discussed further Care coordination telephone calls should come from [facility] T RN CCM]  Exercise Interventions   Exercise Discussed/Reviewed Exercise Reviewed, Physical Activity  [walking around her apartment complex]  Physical Activity Discussed/Reviewed Physical Activity Reviewed, Home Exercise Program (HEP)  Education Interventions   Education Provided Provided Education  [negative social interaction causing increase blood pressure, the importance of monitoring her blood pressure at home]  Provided Verbal Education On Exercise, Mental Health/Coping with Illness  Mental Health Interventions   Mental Health Discussed/Reviewed Mental Health Reviewed, Coping Strategies, Other  Safety Interventions   Safety Discussed/Reviewed Safety Reviewed, Home Safety  [safety in her apartment complex]      Interventions Today    Flowsheet Row Most Recent Value  Chronic Disease   Chronic disease during today's visit --  [Encouraged her to stop her encounter/interaction, memory]  General Interventions   General Interventions Discussed/Reviewed General Interventions Reviewed, Doctor Visits  Doctor Visits Discussed/Reviewed Doctor Visits Reviewed, PCP  PCP/Specialist Visits Compliance with follow-up visit  [Discussed further Care coordination telephone calls should come from [facility] T RN CCM]  Exercise Interventions   Exercise Discussed/Reviewed Exercise Reviewed, Physical Activity  [walking around her apartment complex]  Physical Activity Discussed/Reviewed Physical Activity Reviewed, Home Exercise Program (HEP)  Education Interventions   Education Provided Provided Education  [negative social interaction causing increase blood pressure, the importance of monitoring her blood pressure at home]  Provided Verbal Education On Exercise, Mental Health/Coping with Illness  Mental Health Interventions   Mental Health Discussed/Reviewed Mental Health Reviewed,  Coping Strategies, Other  Safety Interventions    Safety Discussed/Reviewed Safety Reviewed, Home Safety  [safety in her apartment complex]              SDOH assessments and interventions completed:  No     Care Coordination Interventions:  Yes, provided   Follow up plan: No further intervention required.   Encounter Outcome:  Patient Visit Completed   Cala Bradford L. Noelle Penner, RN, BSN, [facility]  VBCI Care Management Coordinator  [phone]  Fax: [phone]

## 2023-02-19 ENCOUNTER — Encounter (HOSPITAL_COMMUNITY): Payer: Self-pay | Admitting: Emergency Medicine

## 2023-02-19 ENCOUNTER — Other Ambulatory Visit: Payer: Self-pay

## 2023-02-19 ENCOUNTER — Emergency Department (HOSPITAL_COMMUNITY)
Admission: EM | Admit: 2023-02-19 | Discharge: 2023-02-19 | Disposition: A | Discharge status: Home / Self Care | Payer: Medicare HMO | Attending: Emergency Medicine | Admitting: Emergency Medicine

## 2023-02-19 ENCOUNTER — Emergency Department (HOSPITAL_COMMUNITY): Payer: Medicare HMO

## 2023-02-19 DIAGNOSIS — W19XXXA Unspecified fall, initial encounter: Secondary | ICD-10-CM | POA: Diagnosis not present

## 2023-02-19 DIAGNOSIS — E119 Type 2 diabetes mellitus without complications: Secondary | ICD-10-CM | POA: Insufficient documentation

## 2023-02-19 DIAGNOSIS — I2510 Atherosclerotic heart disease of native coronary artery without angina pectoris: Secondary | ICD-10-CM | POA: Insufficient documentation

## 2023-02-19 DIAGNOSIS — S82112A Displaced fracture of left tibial spine, initial encounter for closed fracture: Secondary | ICD-10-CM | POA: Diagnosis not present

## 2023-02-19 DIAGNOSIS — Z79899 Other long term (current) drug therapy: Secondary | ICD-10-CM | POA: Insufficient documentation

## 2023-02-19 DIAGNOSIS — M85862 Other specified disorders of bone density and structure, left lower leg: Secondary | ICD-10-CM | POA: Diagnosis not present

## 2023-02-19 DIAGNOSIS — M25562 Pain in left knee: Secondary | ICD-10-CM | POA: Insufficient documentation

## 2023-02-19 DIAGNOSIS — I10 Essential (primary) hypertension: Secondary | ICD-10-CM | POA: Insufficient documentation

## 2023-02-19 DIAGNOSIS — Z7984 Long term (current) use of oral hypoglycemic drugs: Secondary | ICD-10-CM | POA: Diagnosis not present

## 2023-02-19 DIAGNOSIS — S82142A Displaced bicondylar fracture of left tibia, initial encounter for closed fracture: Secondary | ICD-10-CM | POA: Insufficient documentation

## 2023-02-19 DIAGNOSIS — Z7982 Long term (current) use of aspirin: Secondary | ICD-10-CM | POA: Diagnosis not present

## 2023-02-19 DIAGNOSIS — M1712 Unilateral primary osteoarthritis, left knee: Secondary | ICD-10-CM | POA: Diagnosis not present

## 2023-02-19 DIAGNOSIS — S80919A Unspecified superficial injury of unspecified knee, initial encounter: Secondary | ICD-10-CM | POA: Diagnosis not present

## 2023-02-19 DIAGNOSIS — S8992XA Unspecified injury of left lower leg, initial encounter: Secondary | ICD-10-CM | POA: Diagnosis present

## 2023-02-19 DIAGNOSIS — W228XXA Striking against or struck by other objects, initial encounter: Secondary | ICD-10-CM | POA: Diagnosis not present

## 2023-02-19 DIAGNOSIS — M25462 Effusion, left knee: Secondary | ICD-10-CM | POA: Diagnosis not present

## 2023-02-19 DIAGNOSIS — M948X6 Other specified disorders of cartilage, lower leg: Secondary | ICD-10-CM | POA: Diagnosis not present

## 2023-02-19 MED ORDER — OXYCODONE HCL 5 MG PO TABS: 5.0000 mg | ORAL_TABLET | ORAL | 0 refills | Status: DC | PRN

## 2023-02-19 MED ORDER — HYDROCODONE-ACETAMINOPHEN 5-325 MG PO TABS
1.0000 | ORAL_TABLET | Freq: Once | ORAL | Status: DC
  Filled 2023-02-19: qty 1

## 2023-02-19 MED ORDER — HYDROCODONE-ACETAMINOPHEN 5-325 MG PO TABS
1.0000 | ORAL_TABLET | Freq: Once | ORAL | Status: AC
  Administered 2023-02-19: 1 via ORAL
  Filled 2023-02-19: qty 1

## 2023-02-19 MED ORDER — ACETAMINOPHEN 325 MG PO TABS
650.0000 mg | ORAL_TABLET | Freq: Once | ORAL | Status: AC
  Administered 2023-02-19: 650 mg via ORAL
  Filled 2023-02-19: qty 2

## 2023-02-19 NOTE — ED Notes (Signed)
Knee Immobilizer placed by ED tech, PT tolerated well and instructed on use
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
## 2023-02-20 ENCOUNTER — Telehealth: Payer: Self-pay | Admitting: Family Medicine

## 2023-02-20 NOTE — Telephone Encounter (Unsigned)
Copied from CRM [id number]. Topic: General - Other >> Feb 20, 2023 10:18 AM Tonya Carpenter wrote: Reason for CRM: Patient called asking if a nurse could come over and help because she can barely move & also stated she would like to do the Virtual visit to have bedside commode/wheelchair ordered, stated her son may be able to help set up / no same day/sooner appointment available / callback number [phone] >> Feb 20, 2023 10:35 AM MA Clarita Leber wrote: Schedule in [name]'s DOD.

## 2023-02-20 NOTE — Telephone Encounter (Signed)
We can order a bedside commode and a wheelchair but it would likely have to be in a visit.  Can be a virtual visit if daughter is there with her to pull up the video, we can do it virtually

## 2023-02-20 NOTE — Telephone Encounter (Signed)
Copied from CRM [id number]. Topic: General - Other >> Feb 20, 2023  8:10 AM Tonya Carpenter wrote: Reason for CRM: Tonya Carpenter has given verbal permission over the phone for [place] to be able to speak with her niece, April. Please direct any calls to April due to [facility] possibly being asleep from pain meds .Marland Kitchen April can be reached at  [phone]
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
## 2023-02-21 ENCOUNTER — Telehealth (INDEPENDENT_AMBULATORY_CARE_PROVIDER_SITE_OTHER): Payer: Medicare HMO | Admitting: Family Medicine

## 2023-02-21 ENCOUNTER — Ambulatory Visit: Payer: Self-pay | Admitting: Family Medicine

## 2023-02-21 ENCOUNTER — Encounter: Payer: Self-pay | Admitting: Family Medicine

## 2023-02-21 DIAGNOSIS — S82132D Displaced fracture of medial condyle of left tibia, subsequent encounter for closed fracture with routine healing: Secondary | ICD-10-CM | POA: Diagnosis not present

## 2023-02-21 MED ORDER — OXYCODONE HCL 5 MG PO TABS: 5.0000 mg | ORAL_TABLET | Freq: Four times a day (QID) | ORAL | 0 refills | Status: DC | PRN

## 2023-02-21 MED ORDER — DICLOFENAC SODIUM 1 % EX GEL: 2.0000 g | Freq: Four times a day (QID) | CUTANEOUS | 5 refills | Status: DC | PRN
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
## 2023-02-21 NOTE — Telephone Encounter (Signed)
Pt already has an appt today w/[name].
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
## 2023-02-21 NOTE — Telephone Encounter (Signed)
Patient has a virtual visit today so we can discuss it at that point

## 2023-02-24 ENCOUNTER — Telehealth: Payer: Self-pay | Admitting: Family Medicine

## 2023-02-24 NOTE — Telephone Encounter (Signed)
Copied from CRM [id number]. Topic: General - Other >> Feb 24, 2023  7:48 AM Tonya Carpenter wrote: Reason for CRM: Patient called in needing assistance for help to come take a pan bath or at least come sit with her

## 2023-02-24 NOTE — Telephone Encounter (Signed)
Pt had visit with Dr. Louanne Skye last Friday. Home Health was ordered then.   Number busy x2

## 2023-02-25 ENCOUNTER — Telehealth: Payer: Self-pay | Admitting: Family Medicine

## 2023-02-25 DIAGNOSIS — S82132D Displaced fracture of medial condyle of left tibia, subsequent encounter for closed fracture with routine healing: Secondary | ICD-10-CM

## 2023-02-25 NOTE — Telephone Encounter (Unsigned)
Copied from CRM [id number]. Topic: Clinical - Prescription Issue >> Feb 25, 2023  1:38 PM Dennison Nancy wrote: Reason for CRM: Patient needing her diclofenac Sodium (VOLTAREN) 1 % GEL , she has pain in her knees , and need the medication refill soon as possible , please reachout to patient regarding the diclofenac Sodium (VOLTAREN) 1 % GEL

## 2023-02-26 ENCOUNTER — Ambulatory Visit: Payer: Medicare HMO | Admitting: Family Medicine

## 2023-02-26 ENCOUNTER — Encounter: Payer: Self-pay | Admitting: Family Medicine

## 2023-02-26 VITALS — BP 109/66 | HR 68 | Ht 63.0 in

## 2023-02-26 DIAGNOSIS — E1122 Type 2 diabetes mellitus with diabetic chronic kidney disease: Secondary | ICD-10-CM

## 2023-02-26 DIAGNOSIS — E7800 Pure hypercholesterolemia, unspecified: Secondary | ICD-10-CM

## 2023-02-26 DIAGNOSIS — E1159 Type 2 diabetes mellitus with other circulatory complications: Secondary | ICD-10-CM

## 2023-02-26 DIAGNOSIS — I2510 Atherosclerotic heart disease of native coronary artery without angina pectoris: Secondary | ICD-10-CM | POA: Diagnosis not present

## 2023-02-26 DIAGNOSIS — S82132D Displaced fracture of medial condyle of left tibia, subsequent encounter for closed fracture with routine healing: Secondary | ICD-10-CM

## 2023-02-26 DIAGNOSIS — I10 Essential (primary) hypertension: Secondary | ICD-10-CM

## 2023-02-26 DIAGNOSIS — I152 Hypertension secondary to endocrine disorders: Secondary | ICD-10-CM

## 2023-02-26 DIAGNOSIS — N183 Chronic kidney disease, stage 3 unspecified: Secondary | ICD-10-CM | POA: Diagnosis not present

## 2023-02-26 LAB — BAYER DCA HB A1C WAIVED: HB A1C (BAYER DCA - WAIVED): 7.6 % — ABNORMAL HIGH (ref 4.8–5.6)

## 2023-02-26 MED ORDER — METOPROLOL SUCCINATE ER 50 MG PO TB24: 50.0000 mg | ORAL_TABLET | Freq: Every day | ORAL | 3 refills | Status: DC

## 2023-02-26 MED ORDER — DICLOFENAC SODIUM 1 % EX GEL: 2.0000 g | Freq: Four times a day (QID) | CUTANEOUS | 5 refills | Status: AC | PRN

## 2023-02-26 MED ORDER — LOSARTAN POTASSIUM-HCTZ 50-12.5 MG PO TABS: 1.0000 | ORAL_TABLET | Freq: Every day | ORAL | 3 refills | Status: DC

## 2023-02-26 NOTE — Telephone Encounter (Signed)
Sent refill of diclofenac gel for the patient

## 2023-02-26 NOTE — Progress Notes (Signed)
BP 109/66   Pulse 68   Ht 5\' 3"  (1.6 m)   SpO2 99%   BMI 27.34 kg/m    Subjective:   Patient ID: Tonya Carpenter, female    DOB: March 26, 1945, 77 y.o.   MRN: [id number]  HPI: Tonya Carpenter is a 77 y.o. female presenting on 02/26/2023 for Medical Management of Chronic Issues and Diabetes   HPI Type 2 diabetes mellitus Patient comes in today for recheck of his diabetes. Patient has been currently taking Janumet. Patient is currently on an ACE inhibitor/ARB. Patient has seen an ophthalmologist this year. Patient denies any new issues with their feet. The symptom started onset as an adult CKD and hypertension and hyperlipidemia ARE RELATED TO DM   Hypertension Patient is currently on losartan hydrochlorothiazide and metoprolol and hydralazine, and their blood pressure today is 109/66.  She says she gets the occasional lightheadedness or feeling faint but does not happen often. Patient denies headaches, blurred vision, chest pains, shortness of breath, or weakness. Denies any side effects from medication and is content with current medication.   Hyperlipidemia Patient is coming in for recheck of his hyperlipidemia. The patient is currently taking atorvastatin. They deny any issues with myalgias or history of liver damage from it. They deny any focal numbness or weakness or chest pain.   Tibial plateau fracture  patient says she has not seen orthopedic for her fracture yet, we did order home health for but they are working on getting her appointment with the orthopedic.  Relevant past medical, surgical, family and social history reviewed and updated as indicated. Interim medical history since our last visit reviewed. Allergies and medications reviewed and updated.  Review of Systems  Constitutional:  Negative for chills and fever.  HENT:  Negative for congestion, ear discharge and ear pain.   Eyes:  Negative for redness and visual disturbance.  Respiratory:  Negative for chest tightness and  shortness of breath.   Cardiovascular:  Negative for chest pain and leg swelling.  Genitourinary:  Negative for difficulty urinating and dysuria.  Musculoskeletal:  Positive for arthralgias and gait problem (In wheelchair today). Negative for back pain.  Skin:  Negative for rash.  Neurological:  Positive for dizziness and light-headedness. Negative for headaches.  Psychiatric/Behavioral:  Negative for agitation and behavioral problems.   All other systems reviewed and are negative.   Per HPI unless specifically indicated above   Allergies as of 02/26/2023       Reactions   Trilyte [peg 3350-kcl-na Bicarb-nacl] Nausea And Vomiting        Medication List        Accurate as of February 26, 2023 12:49 PM. If you have any questions, ask your nurse or doctor.          STOP taking these medications    hydrALAZINE 10 MG tablet Commonly known as: APRESOLINE Stopped by: Elige Radon Sandford Diop       TAKE these medications    Accu-Chek Guide w/Device Kit USE AS DIRECTED FOR DIABETES   Accu-Chek Softclix Lancets lancets TEST BLOOD SUGAR TWICE DAILY AND AS NEEDED FOR DIABETES   acetaminophen 500 MG tablet Commonly known as: TYLENOL Take 1,000 mg by mouth every 6 (six) hours as needed for mild pain or headache.   aspirin EC 81 MG tablet Commonly known as: Aspirin Low Dose TAKE 1 TABLET EVERY DAY - SWALLOW WHOLE   atorvastatin 20 MG tablet Commonly known as: LIPITOR Take 1 tablet (20 mg total) by  mouth daily.   cholecalciferol 25 MCG (1000 UNIT) tablet Commonly known as: VITAMIN D3 Take 1 tablet (1,000 Units total) by mouth daily.   diclofenac Sodium 1 % Gel Commonly known as: VOLTAREN Apply 2 g topically 4 (four) times daily as needed.   DropSafe Alcohol Prep 70 % Pads TEST BS BID and as needed Dx E11.9   Janumet XR 50-500 MG Tb24 Generic drug: SitaGLIPtin-MetFORMIN HCl Take 1 tablet by mouth 2 (two) times daily.   losartan-hydrochlorothiazide 50-12.5 MG  tablet Commonly known as: HYZAAR Take 1 tablet by mouth daily.   metoprolol succinate 50 MG 24 hr tablet Commonly known as: TOPROL-XL Take 1 tablet (50 mg total) by mouth daily. Take with or immediately following a meal. What changed: See the new instructions. Changed by: Elige Radon Kalib Bhagat   nitroGLYCERIN 0.4 MG SL tablet Commonly known as: NITROSTAT DISSOLVE 1 TABLET UNDER THE TONGUE EVERY 5 MINUTES AS NEEDED FOR CHEST PAIN   omeprazole 40 MG capsule Commonly known as: PRILOSEC Take 1 capsule (40 mg total) by mouth daily.   oxyCODONE 5 MG immediate release tablet Commonly known as: Roxicodone Take 1 tablet (5 mg total) by mouth every 6 (six) hours as needed for severe pain (pain score 7-10).   REFRESH RELIEVA OP Apply 1 drop to eye in the morning and at bedtime.   True Metrix Blood Glucose Test test strip Generic drug: glucose blood TEST BS BID and as needed Dx E11.9   True Metrix Level 1 Low Soln Use as needed with BS machine   TRUEdraw Lancing Device Misc Teset BS BID Dx E11.9         Objective:   BP 109/66   Pulse 68   Ht 5\' 3"  (1.6 m)   SpO2 99%   BMI 27.34 kg/m   Wt Readings from Last 3 Encounters:  02/19/23 154 lb 5.2 oz (70 kg)  11/21/22 153 lb (69.4 kg)  08/16/22 152 lb (68.9 kg)    Physical Exam Vitals and nursing note reviewed.  Constitutional:      General: She is not in acute distress.    Appearance: She is well-developed. She is not diaphoretic.  Eyes:     Conjunctiva/sclera: Conjunctivae normal.  Cardiovascular:     Rate and Rhythm: Normal rate and regular rhythm.     Heart sounds: Normal heart sounds. No murmur heard. Pulmonary:     Effort: Pulmonary effort is normal. No respiratory distress.     Breath sounds: Normal breath sounds. No wheezing.  Musculoskeletal:        General: No swelling. Normal range of motion.  Skin:    General: Skin is warm and dry.     Findings: No rash.  Neurological:     Mental Status: She is alert and  oriented to person, place, and time.     Coordination: Coordination normal.  Psychiatric:        Behavior: Behavior normal.       Assessment & Plan:   Problem List Items Addressed This Visit       Cardiovascular and Mediastinum   Essential hypertension   Relevant Medications   losartan-hydrochlorothiazide (HYZAAR) 50-12.5 MG tablet   metoprolol succinate (TOPROL-XL) 50 MG 24 hr tablet   CAD, multiple vessel   Relevant Medications   losartan-hydrochlorothiazide (HYZAAR) 50-12.5 MG tablet   metoprolol succinate (TOPROL-XL) 50 MG 24 hr tablet     Endocrine   Type 2 diabetes mellitus (HCC) - Primary   Relevant Medications  losartan-hydrochlorothiazide (HYZAAR) 50-12.5 MG tablet   Other Relevant Orders   CBC with Differential/Platelet   CMP14+EGFR   Lipid panel   Bayer DCA Hb A1c Waived   CKD stage 3 due to type 2 diabetes mellitus (HCC)   Relevant Medications   losartan-hydrochlorothiazide (HYZAAR) 50-12.5 MG tablet     Other   Hyperlipidemia   Relevant Medications   losartan-hydrochlorothiazide (HYZAAR) 50-12.5 MG tablet   metoprolol succinate (TOPROL-XL) 50 MG 24 hr tablet   Other Visit Diagnoses     Hypertension associated with diabetes (HCC)       Relevant Medications   losartan-hydrochlorothiazide (HYZAAR) 50-12.5 MG tablet   metoprolol succinate (TOPROL-XL) 50 MG 24 hr tablet   Closed fracture of medial portion of left tibial plateau with routine healing, subsequent encounter           A1c 7.6 today, recent hospitalization and less active, mainly focus on diet.  It sounds like she is drinking sweetened condensed milk all the time so encouraged her to back off on that  Blood pressure low with occasional dizziness, will stop hydralazine Follow up plan: Return in about 3 months (around 05/27/2023), or if symptoms worsen or fail to improve, for Diabetes recheck.  Counseling provided for all of the vaccine components Orders Placed This Encounter  Procedures    CBC with Differential/Platelet   CMP14+EGFR   Lipid panel   Bayer DCA Hb A1c Waived    Arville Care, MD [facility] 02/26/2023, 12:49 PM

## 2023-02-26 NOTE — Telephone Encounter (Signed)
Pt has OV today with [name]

## 2023-02-27 ENCOUNTER — Other Ambulatory Visit: Payer: Self-pay | Admitting: Family Medicine

## 2023-02-27 LAB — CBC WITH DIFFERENTIAL/PLATELET
Basophils Absolute: 0 10*3/uL (ref 0.0–0.2)
Basos: 0 %
EOS (ABSOLUTE): 0.1 10*3/uL (ref 0.0–0.4)
Eos: 1 %
Hematocrit: 32.2 % — ABNORMAL LOW (ref 34.0–46.6)
Hemoglobin: 10 g/dL — ABNORMAL LOW (ref 11.1–15.9)
Immature Grans (Abs): 0 10*3/uL (ref 0.0–0.1)
Immature Granulocytes: 0 %
Lymphocytes Absolute: 2.2 10*3/uL (ref 0.7–3.1)
Lymphs: 19 %
MCH: 26.6 pg (ref 26.6–33.0)
MCHC: 31.1 g/dL — ABNORMAL LOW (ref 31.5–35.7)
MCV: 86 fL (ref 79–97)
Monocytes Absolute: 0.7 10*3/uL (ref 0.1–0.9)
Monocytes: 6 %
Neutrophils Absolute: 8.4 10*3/uL — ABNORMAL HIGH (ref 1.4–7.0)
Neutrophils: 74 %
Platelets: 311 10*3/uL (ref 150–450)
RBC: 3.76 x10E6/uL — ABNORMAL LOW (ref 3.77–5.28)
RDW: 12.9 % (ref 11.7–15.4)
WBC: 11.4 10*3/uL — ABNORMAL HIGH (ref 3.4–10.8)

## 2023-02-27 LAB — CMP14+EGFR
ALT: 17 [IU]/L (ref 0–32)
AST: 20 [IU]/L (ref 0–40)
Albumin: 3.8 g/dL (ref 3.8–4.8)
Alkaline Phosphatase: 63 [IU]/L (ref 44–121)
BUN/Creatinine Ratio: 18 (ref 12–28)
BUN: 28 mg/dL — ABNORMAL HIGH (ref 8–27)
Bilirubin Total: 0.6 mg/dL (ref 0.0–1.2)
CO2: 24 mmol/L (ref 20–29)
Calcium: 9.2 mg/dL (ref 8.7–10.3)
Chloride: 95 mmol/L — ABNORMAL LOW (ref 96–106)
Creatinine, Ser: 1.57 mg/dL — ABNORMAL HIGH (ref 0.57–1.00)
Globulin, Total: 4.3 g/dL (ref 1.5–4.5)
Glucose: 225 mg/dL — ABNORMAL HIGH (ref 70–99)
Potassium: 4.1 mmol/L (ref 3.5–5.2)
Sodium: 133 mmol/L — ABNORMAL LOW (ref 134–144)
Total Protein: 8.1 g/dL (ref 6.0–8.5)
eGFR: 34 mL/min/{1.73_m2} — ABNORMAL LOW (ref >59)

## 2023-02-27 LAB — LIPID PANEL
Chol/HDL Ratio: 3.3 {ratio} (ref 0.0–4.4)
Cholesterol, Total: 121 mg/dL (ref 100–199)
HDL: 37 mg/dL — ABNORMAL LOW (ref >39)
LDL Chol Calc (NIH): 53 mg/dL (ref 0–99)
Triglycerides: 188 mg/dL — ABNORMAL HIGH (ref 0–149)
VLDL Cholesterol Cal: 31 mg/dL (ref 5–40)

## 2023-02-28 ENCOUNTER — Telehealth: Payer: Self-pay | Admitting: Family Medicine

## 2023-02-28 NOTE — Telephone Encounter (Signed)
Lmtcb.

## 2023-02-28 NOTE — Telephone Encounter (Signed)
Yes she can still take Tylenol because the oxycodone that she has does not have Tylenol in it so there is no interaction, she can take them both together or she can alternate but yes she can still take Tylenol

## 2023-02-28 NOTE — Telephone Encounter (Signed)
Copied from CRM [id number]. Topic: Clinical - Medication Question >> Feb 28, 2023  3:53 PM Prudencio Pair wrote: Reason for CRM: Patient is requesting to have nurse or provider to give her a call. She wants to ask about taking the acetaminophen(tylenol) and states the "other medicine in the blue thing" is not doing her any good. Patient's son states it's the oxycodone that she is speaking of. Wants to know if it's ok for her to continue taking the acetaminophen. Please give patient a call back. CB #: [phone].

## 2023-03-03 ENCOUNTER — Ambulatory Visit: Payer: Self-pay | Admitting: Student

## 2023-03-03 DIAGNOSIS — S82102A Unspecified fracture of upper end of left tibia, initial encounter for closed fracture: Secondary | ICD-10-CM | POA: Diagnosis not present

## 2023-03-04 ENCOUNTER — Other Ambulatory Visit: Payer: Self-pay

## 2023-03-04 NOTE — Telephone Encounter (Signed)
Pt made aware. She has no further concerns.

## 2023-03-04 NOTE — Progress Notes (Signed)
SDW CALL  Patient was given pre-op instructions over the phone. The opportunity was given for the patient to ask questions. No further questions asked. Patient verbalized understanding of instructions given.   PCP - Ivin Booty [name],MD Cardiologist - Melany Guernsey  PPM/ICD - denies Device Orders -  Rep Notified -   Chest x-ray -  EKG - 07/12/22 Stress Test - 12/2009 ECHO - 08/04/19 Cardiac Cath - 01/14/13  Sleep Study - denies CPAP - no  Fasting Blood Sugar - 120-130 Checks Blood Sugar one times a day  Blood Thinner Instructions:na Aspirin Instructions:hold DOS  ERAS Protcol -clears until 0650 PRE-SURGERY Ensure or G2- no  COVID TEST- na   Anesthesia review: no  Patient denies shortness of breath, fever, cough and chest pain over the phone call    Surgical Instructions    Your procedure is scheduled on December 18  Report to [facility] Main Entrance "A" at 0720 A.M., then check in with the Admitting office.  Call this number if you have problems the morning of surgery:  [phone]    Remember:  Do not eat after midnight the night before your surgery  You may drink clear liquids until 0650 the morning of your surgery.   Clear liquids allowed are: Water, Non-Citrus Juices (without pulp), Carbonated Beverages, Clear Tea, Black Coffee ONLY (NO MILK, CREAM OR POWDERED CREAMER of any kind), and Gatorade   Take these medicines the morning of surgery with A SIP OF WATER: Lipitor, Refresh eye gtts, Toprol XL,Prilosec. PRN- Oxycodone,Tylenol, Nitroglycerin tablets. If you take Nitroglycerin prior to surgery, call [phone].  As of today, STOP taking any Aspirin (unless otherwise instructed by your surgeon) diclofenac(Voltaren) gel,Aleve, Naproxen, Ibuprofen, Motrin, Advil, Goody's, BC's, all herbal medications, fish oil, and all vitamins.  [facility] is not responsible for any belongings or valuables. .   Do NOT Smoke (Tobacco/Vaping)  24 hours prior to your  procedure  If you use a CPAP at night, you may bring your mask for your overnight stay.   Contacts, glasses, hearing aids, dentures or partials may not be worn into surgery, please bring cases for these belongings   Patients discharged the day of surgery will not be allowed to drive home, and someone needs to stay with them for 24 hours.    Special instructions:    Oral Hygiene is also important to reduce your risk of infection.  Remember - BRUSH YOUR TEETH THE MORNING OF SURGERY WITH YOUR REGULAR TOOTHPASTE   Day of Surgery:  Take a shower the day of or night before with antibacterial soap. Wear Clean/Comfortable clothing the morning of surgery Do not apply any deodorants/lotions.   Do not wear jewelry or makeup Do not wear lotions, powders, perfumes/colognes, or deodorant. Do not shave 48 hours prior to surgery.  Men may shave face and neck. Do not bring valuables to the hospital. Do not wear nail polish, gel polish, artificial nails, or any other type of covering on natural nails (fingers and toes) If you have artificial nails or gel coating that need to be removed by a nail salon, please have this removed prior to surgery. Artificial nails or gel coating may interfere with anesthesia's ability to adequately monitor your vital signs. Remember to brush your teeth WITH YOUR REGULAR TOOTHPASTE.
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
## 2023-03-05 ENCOUNTER — Inpatient Hospital Stay (HOSPITAL_COMMUNITY): Payer: Medicare HMO

## 2023-03-05 ENCOUNTER — Other Ambulatory Visit: Payer: Self-pay

## 2023-03-05 ENCOUNTER — Encounter (HOSPITAL_COMMUNITY): Payer: Self-pay | Admitting: Student

## 2023-03-05 ENCOUNTER — Encounter (HOSPITAL_COMMUNITY)
Admission: RE | Disposition: A | Discharge status: Skilled Nursing Facility | Payer: Self-pay | Source: Home / Self Care | Attending: Student

## 2023-03-05 ENCOUNTER — Inpatient Hospital Stay (HOSPITAL_COMMUNITY): Payer: Medicare HMO | Admitting: Anesthesiology

## 2023-03-05 ENCOUNTER — Inpatient Hospital Stay (HOSPITAL_COMMUNITY)
Admission: RE | Admit: 2023-03-05 | Discharge: 2023-03-11 | DRG: 488 | Disposition: A | Discharge status: Skilled Nursing Facility | Payer: Medicare HMO | Attending: Student | Admitting: Student

## 2023-03-05 DIAGNOSIS — I152 Hypertension secondary to endocrine disorders: Secondary | ICD-10-CM | POA: Insufficient documentation

## 2023-03-05 DIAGNOSIS — S82102A Unspecified fracture of upper end of left tibia, initial encounter for closed fracture: Secondary | ICD-10-CM

## 2023-03-05 DIAGNOSIS — E860 Dehydration: Secondary | ICD-10-CM | POA: Diagnosis present

## 2023-03-05 DIAGNOSIS — E785 Hyperlipidemia, unspecified: Secondary | ICD-10-CM | POA: Diagnosis present

## 2023-03-05 DIAGNOSIS — I10 Essential (primary) hypertension: Secondary | ICD-10-CM | POA: Diagnosis not present

## 2023-03-05 DIAGNOSIS — N183 Chronic kidney disease, stage 3 unspecified: Secondary | ICD-10-CM | POA: Diagnosis not present

## 2023-03-05 DIAGNOSIS — F419 Anxiety disorder, unspecified: Secondary | ICD-10-CM | POA: Diagnosis present

## 2023-03-05 DIAGNOSIS — S83262A Peripheral tear of lateral meniscus, current injury, left knee, initial encounter: Secondary | ICD-10-CM | POA: Diagnosis not present

## 2023-03-05 DIAGNOSIS — Z8673 Personal history of transient ischemic attack (TIA), and cerebral infarction without residual deficits: Secondary | ICD-10-CM

## 2023-03-05 DIAGNOSIS — N179 Acute kidney failure, unspecified: Secondary | ICD-10-CM | POA: Diagnosis not present

## 2023-03-05 DIAGNOSIS — Z90710 Acquired absence of both cervix and uterus: Secondary | ICD-10-CM | POA: Diagnosis not present

## 2023-03-05 DIAGNOSIS — M25462 Effusion, left knee: Secondary | ICD-10-CM | POA: Diagnosis not present

## 2023-03-05 DIAGNOSIS — E1122 Type 2 diabetes mellitus with diabetic chronic kidney disease: Principal | ICD-10-CM

## 2023-03-05 DIAGNOSIS — D62 Acute posthemorrhagic anemia: Secondary | ICD-10-CM | POA: Diagnosis not present

## 2023-03-05 DIAGNOSIS — J45909 Unspecified asthma, uncomplicated: Secondary | ICD-10-CM | POA: Diagnosis not present

## 2023-03-05 DIAGNOSIS — S82142A Displaced bicondylar fracture of left tibia, initial encounter for closed fracture: Secondary | ICD-10-CM | POA: Diagnosis not present

## 2023-03-05 DIAGNOSIS — S82122A Displaced fracture of lateral condyle of left tibia, initial encounter for closed fracture: Principal | ICD-10-CM | POA: Diagnosis present

## 2023-03-05 DIAGNOSIS — Z7984 Long term (current) use of oral hypoglycemic drugs: Secondary | ICD-10-CM

## 2023-03-05 DIAGNOSIS — Z8249 Family history of ischemic heart disease and other diseases of the circulatory system: Secondary | ICD-10-CM

## 2023-03-05 DIAGNOSIS — E871 Hypo-osmolality and hyponatremia: Secondary | ICD-10-CM | POA: Diagnosis present

## 2023-03-05 DIAGNOSIS — R531 Weakness: Secondary | ICD-10-CM | POA: Diagnosis not present

## 2023-03-05 DIAGNOSIS — S82142D Displaced bicondylar fracture of left tibia, subsequent encounter for closed fracture with routine healing: Secondary | ICD-10-CM | POA: Diagnosis not present

## 2023-03-05 DIAGNOSIS — R41841 Cognitive communication deficit: Secondary | ICD-10-CM | POA: Diagnosis not present

## 2023-03-05 DIAGNOSIS — Z7982 Long term (current) use of aspirin: Secondary | ICD-10-CM

## 2023-03-05 DIAGNOSIS — R2689 Other abnormalities of gait and mobility: Secondary | ICD-10-CM | POA: Diagnosis not present

## 2023-03-05 DIAGNOSIS — I2510 Atherosclerotic heart disease of native coronary artery without angina pectoris: Secondary | ICD-10-CM | POA: Insufficient documentation

## 2023-03-05 DIAGNOSIS — I129 Hypertensive chronic kidney disease with stage 1 through stage 4 chronic kidney disease, or unspecified chronic kidney disease: Secondary | ICD-10-CM | POA: Diagnosis not present

## 2023-03-05 DIAGNOSIS — R1311 Dysphagia, oral phase: Secondary | ICD-10-CM | POA: Diagnosis not present

## 2023-03-05 DIAGNOSIS — M6281 Muscle weakness (generalized): Secondary | ICD-10-CM | POA: Diagnosis not present

## 2023-03-05 DIAGNOSIS — Z7401 Bed confinement status: Secondary | ICD-10-CM | POA: Diagnosis not present

## 2023-03-05 DIAGNOSIS — S82122D Displaced fracture of lateral condyle of left tibia, subsequent encounter for closed fracture with routine healing: Secondary | ICD-10-CM | POA: Diagnosis not present

## 2023-03-05 DIAGNOSIS — E119 Type 2 diabetes mellitus without complications: Secondary | ICD-10-CM

## 2023-03-05 HISTORY — PX: ORIF TIBIA PLATEAU: SHX2132

## 2023-03-05 LAB — GLUCOSE, CAPILLARY
Glucose-Capillary: 148 mg/dL — ABNORMAL HIGH (ref 70–99)
Glucose-Capillary: 164 mg/dL — ABNORMAL HIGH (ref 70–99)
Glucose-Capillary: 182 mg/dL — ABNORMAL HIGH (ref 70–99)
Glucose-Capillary: 161 mg/dL — ABNORMAL HIGH (ref 70–99)

## 2023-03-05 LAB — VITAMIN D 25 HYDROXY (VIT D DEFICIENCY, FRACTURES): Vit D, 25-Hydroxy: 32.5 ng/mL (ref 30–100)

## 2023-03-05 SURGERY — OPEN REDUCTION INTERNAL FIXATION (ORIF) TIBIAL PLATEAU
Anesthesia: General | Laterality: Left

## 2023-03-05 MED ORDER — METHOCARBAMOL 1000 MG/10ML IJ SOLN: 500.0000 mg | Freq: Four times a day (QID) | INTRAMUSCULAR | Status: DC | PRN

## 2023-03-05 MED ORDER — POLYETHYLENE GLYCOL 3350 17 G PO PACK: 17.0000 g | PACK | Freq: Every day | ORAL | Status: DC | PRN

## 2023-03-05 MED ORDER — VANCOMYCIN HCL 1000 MG IV SOLR
INTRAVENOUS | Status: DC | PRN
  Administered 2023-03-05: 1000 mg

## 2023-03-05 MED ORDER — INSULIN ASPART 100 UNIT/ML IJ SOLN: 0.0000 [IU] | INTRAMUSCULAR | Status: DC | PRN

## 2023-03-05 MED ORDER — HYDROCHLOROTHIAZIDE 12.5 MG PO TABS
12.5000 mg | ORAL_TABLET | Freq: Every day | ORAL | Status: DC
  Administered 2023-03-05 – 2023-03-11 (×7): 12.5 mg via ORAL
  Filled 2023-03-05 (×7): qty 1

## 2023-03-05 MED ORDER — FENTANYL CITRATE (PF) 250 MCG/5ML IJ SOLN
INTRAMUSCULAR | Status: AC
  Filled 2023-03-05: qty 5

## 2023-03-05 MED ORDER — ASPIRIN 81 MG PO TBEC
81.0000 mg | DELAYED_RELEASE_TABLET | Freq: Every day | ORAL | Status: DC
  Administered 2023-03-06 – 2023-03-11 (×6): 81 mg via ORAL
  Filled 2023-03-05 (×6): qty 1

## 2023-03-05 MED ORDER — DIPHENHYDRAMINE HCL 12.5 MG/5ML PO ELIX: 12.5000 mg | ORAL_SOLUTION | ORAL | Status: DC | PRN

## 2023-03-05 MED ORDER — SODIUM CHLORIDE 0.9 % IV SOLN
INTRAVENOUS | Status: DC
Start: 2023-03-05 — End: 2023-03-05

## 2023-03-05 MED ORDER — METOCLOPRAMIDE HCL 5 MG/ML IJ SOLN: 5.0000 mg | Freq: Three times a day (TID) | INTRAMUSCULAR | Status: DC | PRN

## 2023-03-05 MED ORDER — DOCUSATE SODIUM 100 MG PO CAPS
100.0000 mg | ORAL_CAPSULE | Freq: Two times a day (BID) | ORAL | Status: DC
  Administered 2023-03-05 – 2023-03-11 (×13): 100 mg via ORAL
  Filled 2023-03-05 (×13): qty 1

## 2023-03-05 MED ORDER — ROCURONIUM BROMIDE 10 MG/ML (PF) SYRINGE
PREFILLED_SYRINGE | INTRAVENOUS | Status: AC
  Filled 2023-03-05: qty 10

## 2023-03-05 MED ORDER — LIDOCAINE 2% (20 MG/ML) 5 ML SYRINGE
INTRAMUSCULAR | Status: AC
  Filled 2023-03-05: qty 5

## 2023-03-05 MED ORDER — CHLORHEXIDINE GLUCONATE 0.12 % MT SOLN
OROMUCOSAL | Status: AC
  Administered 2023-03-05: 15 mL via OROMUCOSAL
  Filled 2023-03-05: qty 15

## 2023-03-05 MED ORDER — POLYVINYL ALCOHOL 1.4 % OP SOLN
1.0000 [drp] | Freq: Two times a day (BID) | OPHTHALMIC | Status: DC
  Administered 2023-03-05 – 2023-03-11 (×12): 1 [drp] via OPHTHALMIC
  Filled 2023-03-05: qty 15

## 2023-03-05 MED ORDER — ORAL CARE MOUTH RINSE: 15.0000 mL | Freq: Once | OROMUCOSAL | Status: AC

## 2023-03-05 MED ORDER — INSULIN ASPART 100 UNIT/ML IJ SOLN: 0.0000 [IU] | Freq: Every day | INTRAMUSCULAR | Status: DC

## 2023-03-05 MED ORDER — LINAGLIPTIN 5 MG PO TABS
5.0000 mg | ORAL_TABLET | Freq: Every day | ORAL | Status: DC
  Administered 2023-03-05 – 2023-03-11 (×7): 5 mg via ORAL
  Filled 2023-03-05 (×7): qty 1

## 2023-03-05 MED ORDER — SUGAMMADEX SODIUM 200 MG/2ML IV SOLN
INTRAVENOUS | Status: DC | PRN
  Administered 2023-03-05: 200 mg via INTRAVENOUS

## 2023-03-05 MED ORDER — 0.9 % SODIUM CHLORIDE (POUR BTL) OPTIME
TOPICAL | Status: DC | PRN
  Administered 2023-03-05: 1000 mL

## 2023-03-05 MED ORDER — LIDOCAINE 2% (20 MG/ML) 5 ML SYRINGE
INTRAMUSCULAR | Status: DC | PRN
  Administered 2023-03-05: 60 mg via INTRAVENOUS

## 2023-03-05 MED ORDER — PROPOFOL 10 MG/ML IV BOLUS
INTRAVENOUS | Status: DC | PRN
  Administered 2023-03-05: 90 mg via INTRAVENOUS

## 2023-03-05 MED ORDER — HYDROMORPHONE HCL 1 MG/ML IJ SOLN
0.2500 mg | INTRAMUSCULAR | Status: DC | PRN
Start: 2023-03-05 — End: 2023-03-05
  Administered 2023-03-05 (×2): 0.5 mg via INTRAVENOUS

## 2023-03-05 MED ORDER — CHLORHEXIDINE GLUCONATE 0.12 % MT SOLN: 15.0000 mL | Freq: Once | OROMUCOSAL | Status: AC

## 2023-03-05 MED ORDER — ONDANSETRON HCL 4 MG/2ML IJ SOLN
INTRAMUSCULAR | Status: DC | PRN
  Administered 2023-03-05: 4 mg via INTRAVENOUS

## 2023-03-05 MED ORDER — SUCCINYLCHOLINE CHLORIDE 200 MG/10ML IV SOSY
PREFILLED_SYRINGE | INTRAVENOUS | Status: DC | PRN
  Administered 2023-03-05: 80 mg via INTRAVENOUS

## 2023-03-05 MED ORDER — CEFAZOLIN SODIUM-DEXTROSE 2-4 GM/100ML-% IV SOLN
2.0000 g | Freq: Two times a day (BID) | INTRAVENOUS | Status: AC
  Administered 2023-03-05 – 2023-03-06 (×2): 2 g via INTRAVENOUS
  Filled 2023-03-05 (×2): qty 100

## 2023-03-05 MED ORDER — ACETAMINOPHEN 500 MG PO TABS
1000.0000 mg | ORAL_TABLET | Freq: Four times a day (QID) | ORAL | Status: DC
  Administered 2023-03-05 – 2023-03-11 (×20): 1000 mg via ORAL
  Filled 2023-03-05 (×21): qty 2

## 2023-03-05 MED ORDER — OXYCODONE HCL 5 MG PO TABS
5.0000 mg | ORAL_TABLET | ORAL | Status: DC | PRN
  Administered 2023-03-05 – 2023-03-11 (×14): 5 mg via ORAL
  Filled 2023-03-05 (×16): qty 1

## 2023-03-05 MED ORDER — INSULIN ASPART 100 UNIT/ML IJ SOLN
0.0000 [IU] | Freq: Three times a day (TID) | INTRAMUSCULAR | Status: DC
  Administered 2023-03-05 – 2023-03-06 (×2): 3 [IU] via SUBCUTANEOUS
  Administered 2023-03-06 – 2023-03-07 (×3): 2 [IU] via SUBCUTANEOUS
  Administered 2023-03-07: 3 [IU] via SUBCUTANEOUS
  Administered 2023-03-08: 2 [IU] via SUBCUTANEOUS
  Administered 2023-03-08: 3 [IU] via SUBCUTANEOUS
  Administered 2023-03-09 (×2): 2 [IU] via SUBCUTANEOUS
  Administered 2023-03-10: 5 [IU] via SUBCUTANEOUS

## 2023-03-05 MED ORDER — OXYCODONE HCL 5 MG/5ML PO SOLN: 5.0000 mg | Freq: Once | ORAL | Status: DC | PRN

## 2023-03-05 MED ORDER — HYDROMORPHONE HCL 1 MG/ML IJ SOLN
INTRAMUSCULAR | Status: AC
  Filled 2023-03-05: qty 1

## 2023-03-05 MED ORDER — CEFAZOLIN SODIUM-DEXTROSE 2-4 GM/100ML-% IV SOLN
2.0000 g | INTRAVENOUS | Status: AC
  Administered 2023-03-05: 2 g via INTRAVENOUS
  Filled 2023-03-05: qty 100

## 2023-03-05 MED ORDER — FENTANYL CITRATE (PF) 250 MCG/5ML IJ SOLN
INTRAMUSCULAR | Status: DC | PRN
  Administered 2023-03-05: 50 ug via INTRAVENOUS

## 2023-03-05 MED ORDER — OXYCODONE HCL 5 MG PO TABS: 5.0000 mg | ORAL_TABLET | Freq: Once | ORAL | Status: DC | PRN

## 2023-03-05 MED ORDER — VITAMIN D 25 MCG (1000 UNIT) PO TABS
1000.0000 [IU] | ORAL_TABLET | Freq: Every day | ORAL | Status: DC
  Administered 2023-03-05 – 2023-03-11 (×7): 1000 [IU] via ORAL
  Filled 2023-03-05 (×7): qty 1

## 2023-03-05 MED ORDER — ATORVASTATIN CALCIUM 10 MG PO TABS
20.0000 mg | ORAL_TABLET | Freq: Every day | ORAL | Status: DC
Start: 2023-03-06 — End: 2023-03-11
  Administered 2023-03-06 – 2023-03-11 (×6): 20 mg via ORAL
  Filled 2023-03-05 (×6): qty 2

## 2023-03-05 MED ORDER — NITROGLYCERIN 0.4 MG SL SUBL: 0.4000 mg | SUBLINGUAL_TABLET | SUBLINGUAL | Status: DC | PRN

## 2023-03-05 MED ORDER — ONDANSETRON HCL 4 MG/2ML IJ SOLN: 4.0000 mg | Freq: Once | INTRAMUSCULAR | Status: DC | PRN

## 2023-03-05 MED ORDER — HYDROMORPHONE HCL 1 MG/ML IJ SOLN
INTRAMUSCULAR | Status: DC | PRN
  Administered 2023-03-05: .5 mg via INTRAVENOUS

## 2023-03-05 MED ORDER — LOSARTAN POTASSIUM 50 MG PO TABS
50.0000 mg | ORAL_TABLET | Freq: Every day | ORAL | Status: DC
  Administered 2023-03-05 – 2023-03-11 (×7): 50 mg via ORAL
  Filled 2023-03-05 (×7): qty 1

## 2023-03-05 MED ORDER — PROPOFOL 10 MG/ML IV BOLUS
INTRAVENOUS | Status: AC
  Filled 2023-03-05: qty 20

## 2023-03-05 MED ORDER — HYDROMORPHONE HCL 1 MG/ML IJ SOLN
INTRAMUSCULAR | Status: AC
  Filled 2023-03-05: qty 0.5

## 2023-03-05 MED ORDER — VANCOMYCIN HCL 1000 MG IV SOLR
INTRAVENOUS | Status: AC
  Filled 2023-03-05: qty 20

## 2023-03-05 MED ORDER — METOPROLOL SUCCINATE ER 50 MG PO TB24
50.0000 mg | ORAL_TABLET | Freq: Every day | ORAL | Status: DC
Start: 2023-03-06 — End: 2023-03-11
  Administered 2023-03-06 – 2023-03-11 (×6): 50 mg via ORAL
  Filled 2023-03-05 (×6): qty 1

## 2023-03-05 MED ORDER — METFORMIN HCL 500 MG PO TABS
500.0000 mg | ORAL_TABLET | Freq: Two times a day (BID) | ORAL | Status: DC
  Administered 2023-03-05 – 2023-03-11 (×12): 500 mg via ORAL
  Filled 2023-03-05 (×12): qty 1

## 2023-03-05 MED ORDER — ROCURONIUM BROMIDE 10 MG/ML (PF) SYRINGE
PREFILLED_SYRINGE | INTRAVENOUS | Status: DC | PRN
  Administered 2023-03-05: 40 mg via INTRAVENOUS

## 2023-03-05 MED ORDER — SITAGLIP PHOS-METFORMIN HCL ER 50-500 MG PO TB24: 1.0000 | ORAL_TABLET | Freq: Two times a day (BID) | ORAL | Status: DC

## 2023-03-05 MED ORDER — ONDANSETRON HCL 4 MG/2ML IJ SOLN: 4.0000 mg | Freq: Four times a day (QID) | INTRAMUSCULAR | Status: DC | PRN

## 2023-03-05 MED ORDER — HYDROMORPHONE HCL 1 MG/ML IJ SOLN
0.5000 mg | INTRAMUSCULAR | Status: DC | PRN
  Administered 2023-03-05 – 2023-03-06 (×4): 1 mg via INTRAVENOUS
  Filled 2023-03-05 (×5): qty 1

## 2023-03-05 MED ORDER — PANTOPRAZOLE SODIUM 40 MG PO TBEC
80.0000 mg | DELAYED_RELEASE_TABLET | Freq: Every day | ORAL | Status: DC
Start: 2023-03-06 — End: 2023-03-11
  Administered 2023-03-06 – 2023-03-11 (×6): 80 mg via ORAL
  Filled 2023-03-05 (×7): qty 2

## 2023-03-05 MED ORDER — METHOCARBAMOL 500 MG PO TABS
500.0000 mg | ORAL_TABLET | Freq: Four times a day (QID) | ORAL | Status: DC | PRN
  Administered 2023-03-05 – 2023-03-09 (×7): 500 mg via ORAL
  Filled 2023-03-05 (×7): qty 1

## 2023-03-05 MED ORDER — PHENYLEPHRINE 80 MCG/ML (10ML) SYRINGE FOR IV PUSH (FOR BLOOD PRESSURE SUPPORT)
PREFILLED_SYRINGE | INTRAVENOUS | Status: DC | PRN
  Administered 2023-03-05: 160 ug via INTRAVENOUS
  Administered 2023-03-05: 40 ug via INTRAVENOUS
  Administered 2023-03-05: 80 ug via INTRAVENOUS

## 2023-03-05 MED ORDER — METOCLOPRAMIDE HCL 5 MG PO TABS: 5.0000 mg | ORAL_TABLET | Freq: Three times a day (TID) | ORAL | Status: DC | PRN

## 2023-03-05 MED ORDER — HYDRALAZINE HCL 10 MG PO TABS: 10.0000 mg | ORAL_TABLET | Freq: Four times a day (QID) | ORAL | Status: DC | PRN

## 2023-03-05 MED ORDER — ONDANSETRON HCL 4 MG PO TABS: 4.0000 mg | ORAL_TABLET | Freq: Four times a day (QID) | ORAL | Status: DC | PRN

## 2023-03-05 MED ORDER — SODIUM CHLORIDE 0.9 % IV SOLN: INTRAVENOUS | Status: DC

## 2023-03-05 MED ORDER — LOSARTAN POTASSIUM-HCTZ 50-12.5 MG PO TABS: 1.0000 | ORAL_TABLET | Freq: Every day | ORAL | Status: DC

## 2023-03-05 SURGICAL SUPPLY — 61 items
BAG COUNTER SPONGE SURGICOUNT (BAG) ×2 IMPLANT
BANDAGE ESMARK 6X9 LF (GAUZE/BANDAGES/DRESSINGS) ×2 IMPLANT
BIT DRILL CALIBR QC 2.8X250 (BIT) IMPLANT
BIT DRILL QC 2.5X240 (BIT) IMPLANT
BLADE SURG 15 STRL LF DISP TIS (BLADE) ×2 IMPLANT
BNDG ELASTIC 4INX 5YD STR LF (GAUZE/BANDAGES/DRESSINGS) IMPLANT
BNDG ELASTIC 4X5.8 VLCR STR LF (GAUZE/BANDAGES/DRESSINGS) ×2 IMPLANT
BNDG ELASTIC 6INX 5YD STR LF (GAUZE/BANDAGES/DRESSINGS) IMPLANT
BNDG ELASTIC 6X5.8 VLCR STR LF (GAUZE/BANDAGES/DRESSINGS) ×2 IMPLANT
BNDG ESMARK 6X9 LF (GAUZE/BANDAGES/DRESSINGS) ×1
BNDG GAUZE DERMACEA FLUFF 4 (GAUZE/BANDAGES/DRESSINGS) ×2 IMPLANT
BRUSH SCRUB EZ PLAIN DRY (MISCELLANEOUS) ×4 IMPLANT
CANISTER SUCT 3000ML PPV (MISCELLANEOUS) ×2 IMPLANT
CHLORAPREP W/TINT 26 (MISCELLANEOUS) ×4 IMPLANT
COVER SURGICAL LIGHT HANDLE (MISCELLANEOUS) ×2 IMPLANT
CUFF TRNQT CYL 34X4.125X (TOURNIQUET CUFF) ×2 IMPLANT
DRAPE C-ARM 42X72 X-RAY (DRAPES) ×2 IMPLANT
DRAPE C-ARMOR (DRAPES) ×2 IMPLANT
DRAPE SURG ORHT 6 SPLT 77X108 (DRAPES) ×4 IMPLANT
DRAPE U-SHAPE 47X51 STRL (DRAPES) ×2 IMPLANT
DRSG MEPITEL 4X7.2 (GAUZE/BANDAGES/DRESSINGS) IMPLANT
ELECT REM PT RETURN 9FT ADLT (ELECTROSURGICAL) ×1
ELECTRODE REM PT RTRN 9FT ADLT (ELECTROSURGICAL) ×2 IMPLANT
GAUZE PAD ABD 8X10 STRL (GAUZE/BANDAGES/DRESSINGS) ×4 IMPLANT
GAUZE SPONGE 4X4 12PLY STRL (GAUZE/BANDAGES/DRESSINGS) ×2 IMPLANT
GLOVE BIO SURGEON STRL SZ 6.5 (GLOVE) ×6 IMPLANT
GLOVE BIO SURGEON STRL SZ7.5 (GLOVE) ×8 IMPLANT
GLOVE BIOGEL PI IND STRL 6.5 (GLOVE) ×2 IMPLANT
GLOVE BIOGEL PI IND STRL 7.5 (GLOVE) ×2 IMPLANT
GOWN STRL REUS W/ TWL LRG LVL3 (GOWN DISPOSABLE) ×4 IMPLANT
IMMOBILIZER KNEE 22 UNIV (SOFTGOODS) ×2 IMPLANT
K-WIRE 1.6X150 (WIRE) ×1
KIT BASIN OR (CUSTOM PROCEDURE TRAY) ×2 IMPLANT
KIT TURNOVER KIT B (KITS) ×2 IMPLANT
KWIRE 1.6X150 (WIRE) IMPLANT
NDL SUT 6 .5 CRC .975X.05 MAYO (NEEDLE) ×2 IMPLANT
NS IRRIG 1000ML POUR BTL (IV SOLUTION) ×2 IMPLANT
PACK TOTAL JOINT (CUSTOM PROCEDURE TRAY) ×2 IMPLANT
PAD ARMBOARD 7.5X6 YLW CONV (MISCELLANEOUS) ×4 IMPLANT
PAD CAST 4YDX4 CTTN HI CHSV (CAST SUPPLIES) ×2 IMPLANT
PADDING CAST COTTON 6X4 STRL (CAST SUPPLIES) ×2 IMPLANT
PADDING CAST SYNTHETIC 4X4 STR (CAST SUPPLIES) IMPLANT
PLATE PROX TIB LT 3.5 VA-LCP (Plate) IMPLANT
SCREW LOCK CORT ST 3.5X34 (Screw) IMPLANT
SCREW LOCK CORT ST 3.5X40 (Screw) IMPLANT
SCREW LOCKING 3.5X70MM VA (Screw) IMPLANT
SCREW LOCKING VA 3.5X75MM (Screw) IMPLANT
STAPLER VISISTAT 35W (STAPLE) ×2 IMPLANT
SUCTION TUBE FRAZIER 10FR DISP (SUCTIONS) ×2 IMPLANT
SUT ETHILON 2 0 FS 18 (SUTURE) ×2 IMPLANT
SUT ETHILON 3 0 PS 1 (SUTURE) IMPLANT
SUT FIBERWIRE #2 38 T-5 BLUE (SUTURE) ×1
SUT MON AB 2-0 CT1 36 (SUTURE) IMPLANT
SUT VIC AB 0 CT1 18XCR BRD8 (SUTURE) IMPLANT
SUT VIC AB 0 CT1 27XBRD ANBCTR (SUTURE) IMPLANT
SUT VIC AB 1 CT1 27XBRD ANBCTR (SUTURE) ×2 IMPLANT
SUT VIC AB 2-0 CT1 TAPERPNT 27 (SUTURE) ×4 IMPLANT
SUTURE FIBERWR #2 38 T-5 BLUE (SUTURE) IMPLANT
TOWEL GREEN STERILE (TOWEL DISPOSABLE) ×4 IMPLANT
WATER STERILE IRR 1000ML POUR (IV SOLUTION) ×4 IMPLANT
YANKAUER SUCT BULB TIP NO VENT (SUCTIONS) IMPLANT

## 2023-03-05 NOTE — Anesthesia Preprocedure Evaluation (Addendum)
Anesthesia Evaluation  Patient identified by MRN, date of birth, ID band Patient awake    Reviewed: Allergy & Precautions, NPO status , Patient's Chart, lab work & pertinent test results  Airway Mallampati: II  TM Distance: >3 FB Neck ROM: Full    Dental  (+) Missing, Poor Dentition, Loose   Pulmonary asthma    breath sounds clear to auscultation       Cardiovascular hypertension, Pt. on medications and Pt. on home beta blockers + CAD  + dysrhythmias + Valvular Problems/Murmurs  Rhythm:Regular Rate:Normal  Echo:  1. Left ventricular ejection fraction, by estimation, is 60 to 65%. The  left ventricle has normal function. The left ventricle has no regional  wall motion abnormalities. There is moderate left ventricular hypertrophy.  Left ventricular diastolic  parameters are consistent with Grade I diastolic dysfunction (impaired  relaxation). Elevated left atrial pressure.   2. Right ventricular systolic function is normal. The right ventricular  size is normal. There is normal pulmonary artery systolic pressure.   3. Left atrial size was moderately dilated.   4. The mitral valve is normal in structure. No evidence of mitral valve  regurgitation. No evidence of mitral stenosis.   5. The aortic valve is tricuspid. Aortic valve regurgitation is not  visualized. No aortic stenosis is present.   6. The inferior vena cava is normal in size with greater than 50%  respiratory variability, suggesting right atrial pressure of 3 mmHg.     Neuro/Psych  PSYCHIATRIC DISORDERS Anxiety    Dementia CVA, No Residual Symptoms    GI/Hepatic Neg liver ROS,GERD  ,,  Endo/Other  diabetes, Type 2, Oral Hypoglycemic Agents    Renal/GU CRFRenal disease     Musculoskeletal  (+) Arthritis ,    Abdominal   Peds  Hematology negative hematology ROS (+)   Anesthesia Other Findings   Reproductive/Obstetrics                              Anesthesia Physical Anesthesia Plan  ASA: 3  Anesthesia Plan: General   Post-op Pain Management: Tylenol PO (pre-op)* and Toradol IV (intra-op)*   Induction: Intravenous  PONV Risk Score and Plan: 4 or greater and Ondansetron, Dexamethasone, Midazolam and Scopolamine patch - Pre-op  Airway Management Planned: LMA and Oral ETT  Additional Equipment: None  Intra-op Plan:   Post-operative Plan: Extubation in OR  Informed Consent: I have reviewed the patients History and Physical, chart, labs and discussed the procedure including the risks, benefits and alternatives for the proposed anesthesia with the patient or authorized representative who has indicated his/her understanding and acceptance.     Dental advisory given and Consent reviewed with POA  Plan Discussed with: CRNA  Anesthesia Plan Comments:        Anesthesia Quick Evaluation

## 2023-03-05 NOTE — Interval H&P Note (Signed)
History and Physical Interval Note:  03/05/2023 9:27 AM  Tonya Carpenter  has presented today for surgery, with the diagnosis of Left tibial plateau fracture.  The various methods of treatment have been discussed with the patient and family. After consideration of risks, benefits and other options for treatment, the patient has consented to  Procedure(s): OPEN REDUCTION INTERNAL FIXATION (ORIF) TIBIAL PLATEAU (Left) as a surgical intervention.  The patient's history has been reviewed, patient examined, no change in status, stable for surgery.  I have reviewed the patient's chart and labs.  Questions were answered to the patient's satisfaction.     Caryn Bee P Ezana Hubbert

## 2023-03-05 NOTE — Anesthesia Postprocedure Evaluation (Signed)
Anesthesia Post Note  Patient: Garry Heater [place]  Procedure(s) Performed: OPEN REDUCTION INTERNAL FIXATION (ORIF) TIBIAL PLATEAU (Left)     Patient location during evaluation: PACU Anesthesia Type: General Level of consciousness: awake and alert Pain management: pain level controlled Vital Signs Assessment: post-procedure vital signs reviewed and stable Respiratory status: spontaneous breathing, nonlabored ventilation, respiratory function stable and patient connected to nasal cannula oxygen Cardiovascular status: blood pressure returned to baseline and stable Postop Assessment: no apparent nausea or vomiting Anesthetic complications: no  No notable events documented.  Last Vitals:  Vitals:   03/05/23 1315 03/05/23 1330  BP: (!) 163/69 (!) 159/88  Pulse: 70 68  Resp: 10 (!) 9  Temp:    SpO2: 100% 100%    Last Pain:  Vitals:   03/05/23 1330  TempSrc:   PainSc: Asleep                 Shelton Silvas

## 2023-03-05 NOTE — Transfer of Care (Signed)
Immediate Anesthesia Transfer of Care Note  Patient: Tonya Carpenter  Procedure(s) Performed: OPEN REDUCTION INTERNAL FIXATION (ORIF) TIBIAL PLATEAU (Left)  Patient Location: PACU  Anesthesia Type:General  Level of Consciousness: drowsy and patient cooperative  Airway & Oxygen Therapy: Patient Spontanous Breathing  Post-op Assessment: Report given to RN and Post -op Vital signs reviewed and stable  Post vital signs: Reviewed and stable  Last Vitals:  Vitals Value Taken Time  BP    Temp    Pulse 75   Resp 14   SpO2 92%     Last Pain:  Vitals:   03/05/23 0814  TempSrc:   PainSc: 8       Patients Stated Pain Goal: 2 (03/05/23 0814)  Complications: No notable events documented.

## 2023-03-05 NOTE — Op Note (Signed)
Orthopaedic Surgery Operative Note (CSN: [id number] ) Date of Surgery: 03/05/2023  Admit Date: 03/05/2023   Diagnoses: Pre-Op Diagnoses: Left lateral tibial plateau fracture  Post-Op Diagnosis: Left lateral tibial plateau fracture Left lateral peripheral meniscus tear  Procedures: CPT 27535-Open reduction internal fixation of left lateral tibial plateau fracture CPT 27403-Open repair of left lateral meniscus tear  Surgeons : Primary: Roby Lofts, MD  Assistant: Ulyses Southward, PA-C  Location: OR 5   Anesthesia: General   Antibiotics: Ancef 2g preop with 1 gm vancomycin powder placed topically   Tourniquet time:  Total Tourniquet Time Documented: Thigh (Left) - 4 minutes Thigh (Left) - 44 minutes Total: Thigh (Left) - 48 minutes  Estimated Blood Loss: 75 mL  Complications: None  Specimens:* No specimens in log *   Implants: Implant Name Type Inv. Item Serial No. Manufacturer Lot No. LRB No. Used Action  PLATE PROX TIB LT 3.5 VA-LCP - [id number] Plate PLATE PROX TIB LT 3.5 VA-LCP  [employer]  Left 1 Implanted  SCREW LOCK CORT ST 3.5X40 - [id number] Screw SCREW LOCK CORT ST 3.5X40  [employer]  Left 1 Implanted  SCREW LOCK CORT ST 3.5X34 - [id number] Screw SCREW LOCK CORT ST 3.5X34  DEPU[employer]TH[employer]  Left 1 Implanted  SCREW LOCKING VA 3.5X75MM - [id number] Screw SCREW LOCKING VA 3.5X75MM  DE[employer]  Left 1 Implanted  SCREW LOCKING 3.5X70MM VA - [id number] Screw SCREW LOCKING 3.5X70MM VA  [employer]  Left 2 Implanted     Indications for Surgery: 77 year old female who sustained a ground-level fall with a lateral displaced tibial plateau fracture.  Due to the unstable nature of her injury and significant risk of instability and arthritic changes of her knee I recommend proceeding with open reduction internal fixation.  Risks and benefits were discussed with the patient and her family.  Risks included but not limited to bleeding,  infection, malunion, nonunion, hardware failure, hardware rotation, nerve or blood vessel injury, knee stiffness, posttraumatic arthritis, even the possibility anesthetic complications.  She agreed to proceed with surgery consent was obtained.  Operative Findings: 1.  Open reduction internal fixation of left lateral tibial plateau fracture using Synthes VA 3.5 mm proximal tibial locking plate. 2.  Open repair of left peripheral lateral meniscus tear using #2 FiberWire through the capsule in a horizontal mattress fashion.  Procedure: The patient was identified in the preoperative holding area. Consent was confirmed with the patient and their family and all questions were answered. The operative extremity was marked after confirmation with the patient. she was then brought back to the operating room by our anesthesia colleagues.  She was placed under general anesthetic and carefully transferred over to radiolucent flattop table.  A bump was placed under her operative hip.  A nonsterile tourniquet was placed to the upper thigh.  The left lower extremity was then prepped and draped in usual sterile fashion.  A timeout was performed to verify the patient, the procedure, and the extremity.  Preoperative antibiotics were dosed.  Fluoroscopic imaging showed the unstable nature of her injury.  A lateral parapatellar incision was made after the tourniquet was inflated.  I incised through the skin and subcutaneous tissue to the IT band.  I then incised through the IT band and developed the interval between the capsule and the IT band and then released the IT band off the lateral condyle.  I released the posteriorly until I could palpate the fibular head.  At this point I then performed a submeniscal  arthrotomy.  I tagged the capsule with 0 Vicryl sutures for later repair back down to the plate.  Here I encountered a peripheral lateral meniscus tear.  I then used a #2 FiberWire to throw horizontal mattress sutures  through the capsule and through the meniscus and I tagged these for later repair at the end of the case.  At this point I then focused on the articular injury.  Her bone quality was very poor with what appears to be osteoporosis.  I was able to enter the anterior split and elevate the joint appropriately.  I then held it provisionally with K wires.  I then reduced the lateral condyle and placed the K wires across the fracture into the medial condyle.  Decent reduction of the articular surface was obtained.  I then chose a Synthes 3.5 mm VA proximal tibial locking plate and positioned this appropriately along the lateral condyle.  I slid it submuscularly along the lateral cortex of the tibia.  I then placed a nonlocking screw at the apex of the fracture to buttress the lateral condyle.  I got excellent fixation and proceeded to place another nonlocking screw distally in the tibial shaft.  I returned to the proximal segment and placed 4 locking screws across the articular surface.  Final fluoroscopic imaging was obtained.  The incisions were copiously irrigated.  A gram of vancomycin powder was placed into the incision.  A free needle was used to tag the 0 Vicryl sutures back down to the plate.  The #2 FiberWire sutures were then tied down to the capsule to repair the meniscus.  We then closed the IT band with 0 Vicryl suture.  The skin was closed with 2-0 Monocryl and 3-0 nylon.  Sterile dressings were applied.  The patient was awoken from anesthesia and taken to the PACU in stable condition.  Post Op Plan/Instructions: Patient be nonweightbearing to the left lower extremity.  She will have unrestricted range of motion of the knee.  I would request that she wears her knee immobilizer at night for the first 2 weeks.  Will have her mobilize with physical and Occupational Therapy and she will receive postoperative Ancef.  I would recommend aspirin 81 mg for DVT prophylaxis.  I was present and performed the  entire surgery.  Ulyses Southward, PA-C did assist me throughout the case. An assistant was necessary given the difficulty in approach, maintenance of reduction and ability to instrument the fracture.   Truitt Merle, MD [facility]

## 2023-03-06 ENCOUNTER — Encounter (HOSPITAL_COMMUNITY): Payer: Self-pay | Admitting: Student

## 2023-03-06 LAB — BASIC METABOLIC PANEL
Anion gap: 5 (ref 5–15)
BUN: 15 mg/dL (ref 8–23)
CO2: 25 mmol/L (ref 22–32)
Calcium: 8.5 mg/dL — ABNORMAL LOW (ref 8.9–10.3)
Chloride: 104 mmol/L (ref 98–111)
Creatinine, Ser: 1.44 mg/dL — ABNORMAL HIGH (ref 0.44–1.00)
GFR, Estimated: 38 mL/min — ABNORMAL LOW (ref >60)
Glucose, Bld: 138 mg/dL — ABNORMAL HIGH (ref 70–99)
Potassium: 3.8 mmol/L (ref 3.5–5.1)
Sodium: 134 mmol/L — ABNORMAL LOW (ref 135–145)

## 2023-03-06 LAB — CBC
HCT: 25.2 % — ABNORMAL LOW (ref 36.0–46.0)
Hemoglobin: 7.9 g/dL — ABNORMAL LOW (ref 12.0–15.0)
MCH: 27 pg (ref 26.0–34.0)
MCHC: 31.3 g/dL (ref 30.0–36.0)
MCV: 86 fL (ref 80.0–100.0)
Platelets: 254 10*3/uL (ref 150–400)
RBC: 2.93 MIL/uL — ABNORMAL LOW (ref 3.87–5.11)
RDW: 13.8 % (ref 11.5–15.5)
WBC: 13.4 10*3/uL — ABNORMAL HIGH (ref 4.0–10.5)
nRBC: 0 % (ref 0.0–0.2)

## 2023-03-06 LAB — GLUCOSE, CAPILLARY
Glucose-Capillary: 131 mg/dL — ABNORMAL HIGH (ref 70–99)
Glucose-Capillary: 137 mg/dL — ABNORMAL HIGH (ref 70–99)
Glucose-Capillary: 159 mg/dL — ABNORMAL HIGH (ref 70–99)
Glucose-Capillary: 143 mg/dL — ABNORMAL HIGH (ref 70–99)
Glucose-Capillary: 132 mg/dL — ABNORMAL HIGH (ref 70–99)

## 2023-03-06 NOTE — Plan of Care (Signed)

## 2023-03-06 NOTE — Progress Notes (Signed)
   03/06/23 1431  Spiritual Encounters  Type of Visit Initial  Care provided to: Pt and family  Conversation partners present during encounter Nurse  Referral source Family  Reason for visit Advance directives  OnCall Visit No  Advance Directives (For Healthcare)  Does Patient Have a Medical Advance Directive? Yes  Does patient want to make changes to medical advance directive? Yes (Inpatient - patient requests chaplain consult to change a medical advance directive)  Type of Advance Directive Healthcare Power of [place];Living will  Copy of Healthcare Power of Attorney in Chart? Yes - validated most recent copy scanned in chart (See row information)   Chaplain visited patient and her niece. Chaplain coordinated for a notary and witnesses to complete AD paperwork. AD notarized, scanned, a hard copy was placed in the paper chart and the original was given back to the patient.   Arlyce Dice, Chaplain Resident [phone]

## 2023-03-06 NOTE — Evaluation (Signed)
Occupational Therapy Evaluation Patient Details Name: Tonya Carpenter MRN: [id number] DOB: [birth year] Today's Date: 03/06/2023   History of Present Illness Pt is a 77 y/o female who presents 03/05/2023 s/p fall at home. She sustained a L tibial plateau fx and is now s/p ORIF on 03/05/2023. PMH significant for Anxiety, chronic R shoulder pain, CAD, DOE, HTN, memory changes (2023), CVA, DM II.   Clinical Impression   PTA patient reports independent with ADLs and mobility using RW. Admitted for above and presents with problem list below.  She requires setup to total assist for ADLs, max assist +2 for bed mobility and max assist +2 for transfers.  Pt significantly limited by pain in L LE, premedicated and requires encouragement for ADLs/mobilization.  She requires cueing/assist to maintain L LE NWB during session, able to follow simple commands but easily distracted and requires cueing for problem solving. Based on performance today, believe patient will best benefit from continued OT services acutely and after dc at inpatient setting with <3hrs/day to optimize independence, safety with ADLs and mobility.        If plan is discharge home, recommend the following: Two people to help with walking and/or transfers;Two people to help with bathing/dressing/bathroom    Functional Status Assessment  Patient has had a recent decline in their functional status and demonstrates the ability to make significant improvements in function in a reasonable and predictable amount of time.  Equipment Recommendations  Other (comment) (defer)    Recommendations for Other Services       Precautions / Restrictions Precautions Precautions: Fall Required Braces or Orthoses: Knee Immobilizer - Left (At night) Restrictions Weight Bearing Restrictions Per Provider Order: Yes LLE Weight Bearing Per Provider Order: Non weight bearing Other Position/Activity Restrictions: KI at night      Mobility Bed Mobility Overal  bed mobility: Needs Assistance Bed Mobility: Supine to Sit     Supine to sit: Max assist, +2 for physical assistance, HOB elevated, Used rails     General bed mobility comments: Hand over hand assist to reach for railings. +2 max assist for LE advancement towards EOB, scooting hips with bed pad, and elevating trunk to full sitting position on EOB. Once sitting, pt able to scoot forward and get feet on the floor.    Transfers Overall transfer level: Needs assistance Equipment used: 2 person hand held assist Transfers: Sit to/from Stand, Bed to chair/wheelchair/BSC Sit to Stand: Max assist, +2 physical assistance   Squat pivot transfers: Max assist, +2 physical assistance       General transfer comment: +2 max assist to clear hips from EOB and pivot around to [facility]. Therapist maintained a foot under the LLE throughout to ensure pt maintaining NWB status. Pt with difficulty keeping foot up off the floor. From [facility], +2 required for stand while a 3rd person removed BSC and replaced it with the recliner for pt to sit.      Balance Overall balance assessment: Needs assistance Sitting-balance support: No upper extremity supported, Feet supported Sitting balance-Leahy Scale: Fair     Standing balance support: Bilateral upper extremity supported, During functional activity, Reliant on assistive device for balance Standing balance-Leahy Scale: Zero Standing balance comment: +2 required                           ADL either performed or assessed with clinical judgement   ADL Overall ADL's : Needs assistance/impaired     Grooming: Set up;Sitting  Upper Body Dressing : Set up;Sitting   Lower Body Dressing: Maximal assistance;Sit to/from stand Lower Body Dressing Details (indicate cue type and reason): assist for socks, +2 in standing Toilet Transfer: Maximal assistance;+2 for physical assistance;BSC/3in1;Squat-pivot Toilet Transfer Details (indicate cue type and  reason): bil hand held assist Toileting- Clothing Manipulation and Hygiene: Moderate assistance;Sitting/lateral lean;Sit to/from stand;+2 for physical assistance Toileting - Clothing Manipulation Details (indicate cue type and reason): +2 if standing     Functional mobility during ADLs: Maximal assistance;+2 for physical assistance       Vision   Vision Assessment?: No apparent visual deficits     Perception         Praxis         Pertinent Vitals/Pain Pain Assessment Pain Assessment: Faces Faces Pain Scale: Hurts whole lot Pain Location: L LE Pain Descriptors / Indicators: Operative site guarding, Sore, Moaning, Grimacing Pain Intervention(s): Limited activity within patient's tolerance, Monitored during session, Premedicated before session, Repositioned     Extremity/Trunk Assessment Upper Extremity Assessment Upper Extremity Assessment: Generalized weakness   Lower Extremity Assessment Lower Extremity Assessment: Defer to PT evaluation LLE Deficits / Details: Decreased strength and AROM consistent with above mentioned injury and subsequent surgery. LLE: Unable to fully assess due to pain LLE Coordination: decreased fine motor;decreased gross motor   Cervical / Trunk Assessment Cervical / Trunk Assessment: Normal;Other exceptions Cervical / Trunk Exceptions: Forward head posture with rounded shoulders   Communication Communication Communication: No apparent difficulties Cueing Techniques: Verbal cues;Gestural cues   Cognition Arousal: Alert Behavior During Therapy: Anxious Overall Cognitive Status: Impaired/Different from baseline Area of Impairment: Orientation, Attention, Memory, Following commands, Safety/judgement, Awareness, Problem solving                 Orientation Level: Disoriented to, Place, Time Current Attention Level: Sustained Memory: Decreased recall of precautions Following Commands: Follows one step commands with increased time,  Follows one step commands inconsistently Safety/Judgement: Decreased awareness of safety Awareness: Intellectual Problem Solving: Slow processing, Difficulty sequencing, Requires verbal cues, Decreased initiation       General Comments       Exercises     Shoulder Instructions      Home Living Family/patient expects to be discharged to:: Private residence Living Arrangements: Children Available Help at Discharge: Family;Available PRN/intermittently (son lives with her) Type of Home: Apartment Home Access: Level entry     Home Layout: One level     Bathroom Shower/Tub: Chief Strategy Officer: Standard     Home Equipment: Agricultural consultant (2 wheels);Rollator (4 wheels);Wheelchair - manual          Prior Functioning/Environment Prior Level of Function : Independent/Modified Independent             Mobility Comments: using RW for mobility, w/c since fall ADLs Comments: reports independent before fall, needing more assist since fall and completing basin baths with support from family        OT Problem List: Decreased strength;Decreased activity tolerance;Impaired balance (sitting and/or standing);Pain;Decreased knowledge of precautions;Decreased knowledge of use of DME or AE;Decreased safety awareness;Decreased cognition      OT Treatment/Interventions: Self-care/ADL training;Therapeutic exercise;DME and/or AE instruction;Therapeutic activities;Balance training;Patient/family education    OT Goals(Current goals can be found in the care plan section) Acute Rehab OT Goals Patient Stated Goal: less pain OT Goal Formulation: With patient Time For Goal Achievement: 03/20/23 Potential to Achieve Goals: Fair  OT Frequency: Min 1X/week    Co-evaluation PT/OT/SLP Co-Evaluation/Treatment: Yes Reason for Co-Treatment:  Complexity of the patient's impairments (multi-system involvement);Necessary to address cognition/behavior during functional activity;For  patient/therapist safety;To address functional/ADL transfers PT goals addressed during session: Mobility/safety with mobility OT goals addressed during session: ADL's and self-care      AM-PAC OT "6 Clicks" Daily Activity     Outcome Measure Help from another person eating meals?: A Little Help from another person taking care of personal grooming?: A Little Help from another person toileting, which includes using toliet, bedpan, or urinal?: A Lot Help from another person bathing (including washing, rinsing, drying)?: A Lot Help from another person to put on and taking off regular upper body clothing?: A Little Help from another person to put on and taking off regular lower body clothing?: A Lot 6 Click Score: 15   End of Session Equipment Utilized During Treatment: Gait belt Nurse Communication: Mobility status  Activity Tolerance: Patient tolerated treatment well Patient left: in chair;with call bell/phone within reach;with chair alarm set;with nursing/sitter in room  OT Visit Diagnosis: Other abnormalities of gait and mobility (R26.89);Muscle weakness (generalized) (M62.81);History of falling (Z91.81);Pain Pain - Right/Left: Left Pain - part of body: Leg                Time: 3664-4034 OT Time Calculation (min): 30 min Charges:  OT General Charges $OT Visit: 1 Visit OT Evaluation $OT Eval Moderate Complexity: 1 Mod OT Treatments $Self Care/Home Management : 8-22 mins  Barry Brunner, OT Acute Rehabilitation Services Office [phone]   Chancy Milroy 03/06/2023, 2:20 PM

## 2023-03-06 NOTE — Plan of Care (Signed)

## 2023-03-06 NOTE — NC FL2 (Signed)
[place] MEDICAID FL2 LEVEL OF CARE FORM     IDENTIFICATION  Patient Name: Tonya Carpenter Birthdate: February 17, 1946 Sex: female Admission Date (Current Location): 03/05/2023  [facility] and [place] Number:  Producer, television/film/video and Address:  [facility], [address], [place], [place] [address]      Provider Number: [id number]  Attending Physician Name and Address:  Roby Lofts, MD  Relative Name and Phone Number:       Current Level of Care: Hospital Recommended Level of Care: Skilled Nursing Facility Prior Approval Number:    Date Approved/Denied:   PASRR Number: [id number]  Discharge Plan: SNF    Current Diagnoses: Patient Active Problem List   Diagnosis Date Noted   Closed fracture of lateral portion of left tibial plateau, initial encounter 03/05/2023   Unspecified dementia, moderate, without behavioral disturbance, psychotic disturbance, mood disturbance, and anxiety (HCC) 01/31/2022   Chronic right shoulder pain 01/31/2022   Low-level of literacy 01/30/2022   CKD stage 3 due to type 2 diabetes mellitus (HCC) 04/07/2020   LBBB (left bundle branch block) 07/20/2019   GERD (gastroesophageal reflux disease) 10/20/2013   Urinary frequency 02/18/2013   CAD, multiple vessel 07/10/2012   Precordial pain 09/04/2009   Cardiac murmur 07/03/2009   Type 2 diabetes mellitus (HCC) 09/12/2008   Hyperlipidemia 09/12/2008   Essential hypertension 09/12/2008    Orientation RESPIRATION BLADDER Height & Weight     Self, Time, Situation, Place  Normal Continent Weight: 156 lb (70.8 kg) Height:  5\' 3"  (160 cm)  BEHAVIORAL SYMPTOMS/MOOD NEUROLOGICAL BOWEL NUTRITION STATUS      Continent    AMBULATORY STATUS COMMUNICATION OF NEEDS Skin   Extensive Assist Verbally Surgical wounds                       Personal Care Assistance Level of Assistance  Bathing, Feeding, Dressing Bathing Assistance: Maximum assistance Feeding assistance: Limited  assistance Dressing Assistance: Limited assistance     Functional Limitations Info  Sight, Hearing, Speech Sight Info: Adequate Hearing Info: Adequate Speech Info: Adequate    SPECIAL CARE FACTORS FREQUENCY  PT (By licensed PT), OT (By licensed OT)                    Contractures Contractures Info: Not present    Additional Factors Info  Code Status Code Status Info: FULL CODE             Current Medications (03/06/2023):  This is the current hospital active medication list Current Facility-Administered Medications  Medication Dose Route Frequency Provider Last Rate Last Admin   0.9 %  sodium chloride infusion   Intravenous Continuous [place] Bali, PA-C 50 mL/hr at 03/05/23 1517 New Bag at 03/05/23 1517   acetaminophen (TYLENOL) tablet 1,000 mg  1,000 mg Oral Q6H McClung, Sarah A, PA-C   1,000 mg at 03/06/23 1107   aspirin EC tablet 81 mg  81 mg Oral Daily West Bali, PA-C   81 mg at 03/06/23 5621   atorvastatin (LIPITOR) tablet 20 mg  20 mg Oral Daily West Bali, PA-C   20 mg at 03/06/23 3086   cholecalciferol (VITAMIN D3) 25 MCG (1000 UNIT) tablet 1,000 Units  1,000 Units Oral Daily West Bali, PA-C   1,000 Units at 03/06/23 0840   diphenhydrAMINE (BENADRYL) 12.5 MG/5ML elixir 12.5-25 mg  12.5-25 mg Oral Q4H PRN West Bali, PA-C  docusate sodium (COLACE) capsule 100 mg  100 mg Oral BID West Bali, PA-C   100 mg at 03/06/23 0840   hydrALAZINE (APRESOLINE) tablet 10 mg  10 mg Oral Q6H PRN West Bali, PA-C       losartan (COZAAR) tablet 50 mg  50 mg Oral Daily West Bali, PA-C   50 mg at 03/06/23 0840   And   hydrochlorothiazide (HYDRODIURIL) tablet 12.5 mg  12.5 mg Oral Daily West Bali, PA-C   12.5 mg at 03/06/23 0840   HYDROmorphone (DILAUDID) injection 0.5-1 mg  0.5-1 mg Intravenous Q4H PRN West Bali, PA-C   1 mg at 03/06/23 1107   insulin aspart (novoLOG) injection 0-15 Units  0-15 Units Subcutaneous  TID WC West Bali, PA-C   3 Units at 03/06/23 1221   insulin aspart (novoLOG) injection 0-5 Units  0-5 Units Subcutaneous QHS McClung, Sarah A, PA-C       linagliptin (TRADJENTA) tablet 5 mg  5 mg Oral Daily West Bali, PA-C   5 mg at 03/06/23 0840   And   metFORMIN (GLUCOPHAGE) tablet 500 mg  500 mg Oral BID WC West Bali, PA-C   500 mg at 03/06/23 0840   methocarbamol (ROBAXIN) tablet 500 mg  500 mg Oral Q6H PRN West Bali, PA-C   500 mg at 03/06/23 7829   Or   methocarbamol (ROBAXIN) injection 500 mg  500 mg Intravenous Q6H PRN West Bali, PA-C       metoCLOPramide (REGLAN) tablet 5-10 mg  5-10 mg Oral Q8H PRN Sharon Seller, Sarah A, PA-C       Or   metoCLOPramide (REGLAN) injection 5-10 mg  5-10 mg Intravenous Q8H PRN West Bali, PA-C       metoprolol succinate (TOPROL-XL) 24 hr tablet 50 mg  50 mg Oral Daily Thyra Breed A, PA-C   50 mg at 03/06/23 5621   nitroGLYCERIN (NITROSTAT) SL tablet 0.4 mg  0.4 mg Sublingual Q5 min PRN West Bali, PA-C       ondansetron (ZOFRAN) tablet 4 mg  4 mg Oral Q6H PRN West Bali, PA-C       Or   ondansetron (ZOFRAN) injection 4 mg  4 mg Intravenous Q6H PRN Sharon Seller, Sarah A, PA-C       oxyCODONE (Oxy IR/ROXICODONE) immediate release tablet 5 mg  5 mg Oral Q4H PRN West Bali, PA-C   5 mg at 03/06/23 0840   pantoprazole (PROTONIX) EC tablet 80 mg  80 mg Oral Daily West Bali, PA-C   80 mg at 03/06/23 0840   polyethylene glycol (MIRALAX / GLYCOLAX) packet 17 g  17 g Oral Daily PRN Thyra Breed A, PA-C       polyvinyl alcohol (LIQUIFILM TEARS) 1.4 % ophthalmic solution 1 drop  1 drop Both Eyes BID West Bali, PA-C   1 drop at 03/06/23 1030     Discharge Medications: Please see discharge summary for a list of discharge medications.  Relevant Imaging Results:  Relevant Lab Results:   Additional Information SS# [id number]  Deatra Robinson, [place]

## 2023-03-06 NOTE — TOC Initial Note (Signed)
Transition of Care [facility]) - Initial/Assessment Note    Patient Details  Name: Tonya Carpenter MRN: [id number] Date of Birth: 1945-05-01  Transition of Care [facility]) CM/SW Contact:    Deatra Robinson, [place] Phone Number: 03/06/2023, 3:55 PM  Clinical Narrative: SW spoke with pt and pt's cousin April re PT recommendation for SNF. Pt from home alone, no previous SNF stay. Explained SNF placement process and answered questions. Pt reports agreeable to SNF for STR and requesting search in [place] and [place]. Will f/u with offers as available. Pt will require auth for SNF.   Dellie Burns, MSW, LCSW [phone] (coverage)                    Expected Discharge Plan: Skilled Nursing Facility Barriers to Discharge: Continued Medical Work up, SNF Pending bed offer, Insurance Authorization   Patient Goals and CMS Choice   CMS Medicare.gov Compare Post Acute Care list provided to:: Patient Choice offered to / list presented to : Patient [place] ownership interest in [facility].provided to:: Patient    Expected Discharge Plan and Services     Post Acute Care Choice: Skilled Nursing Facility Living arrangements for the past 2 months: Apartment                                      Prior Living Arrangements/Services Living arrangements for the past 2 months: Apartment Lives with:: Self Patient language and need for interpreter reviewed:: Yes        Need for Family Participation in Patient Care: Yes (Comment) Care giver support system in place?: Yes (comment)   Criminal Activity/Legal Involvement Pertinent to Current Situation/Hospitalization: No - Comment as needed  Activities of Daily Living   ADL Screening (condition at time of admission) Independently performs ADLs?: Yes (appropriate for developmental age) Is the patient deaf or have difficulty hearing?: No Does the patient have difficulty seeing, even when wearing glasses/contacts?: No Does  the patient have difficulty concentrating, remembering, or making decisions?: Yes  Permission Sought/Granted Permission sought to share information with : Facility Medical sales representative                Emotional Assessment       Orientation: : Oriented to Self, Oriented to Place, Oriented to  Time, Oriented to Situation Alcohol / Substance Use: Not Applicable Psych Involvement: No (comment)  Admission diagnosis:  Closed fracture of lateral portion of left tibial plateau, initial encounter [S82.122A] Patient Active Problem List   Diagnosis Date Noted   Closed fracture of lateral portion of left tibial plateau, initial encounter 03/05/2023   Unspecified dementia, moderate, without behavioral disturbance, psychotic disturbance, mood disturbance, and anxiety (HCC) 01/31/2022   Chronic right shoulder pain 01/31/2022   Low-level of literacy 01/30/2022   CKD stage 3 due to type 2 diabetes mellitus (HCC) 04/07/2020   LBBB (left bundle branch block) 07/20/2019   GERD (gastroesophageal reflux disease) 10/20/2013   Urinary frequency 02/18/2013   CAD, multiple vessel 07/10/2012   Precordial pain 09/04/2009   Cardiac murmur 07/03/2009   Type 2 diabetes mellitus (HCC) 09/12/2008   Hyperlipidemia 09/12/2008   Essential hypertension 09/12/2008   PCP:  [name], Elige Radon, MD Pharmacy:   [facility] [place], [place] - [address] [place] [address] Phone: [phone] Fax: [phone]  [facility] - [place]  [place], [place] - [address] [place] [address] Phone: [phone] Fax: [phone]     Social Drivers of Health (SDOH) Social History: SDOH Screenings   Food Insecurity: Food Insecurity Present (03/05/2023)  Housing: Low Risk  (03/05/2023)  Transportation Needs: No Transportation Needs (03/05/2023)  Utilities: Not At Risk (03/05/2023)  Alcohol Screen: Low Risk  (06/28/2021)  Depression  (PHQ2-9): Medium Risk (02/26/2023)  Financial Resource Strain: Low Risk  (07/05/2022)  Physical Activity: Insufficiently Active (07/05/2022)  Social Connections: Moderately Integrated (07/05/2022)  Stress: No Stress Concern Present (07/05/2022)  Tobacco Use: Low Risk  (03/05/2023)   SDOH Interventions:     Readmission Risk Interventions     No data to display

## 2023-03-06 NOTE — Progress Notes (Signed)
Orthopaedic Trauma Progress Note  SUBJECTIVE: Doing okay this morning.  Notes soreness through the left leg.  We discussed given her injury and surgery this is to be expected.  Pain medications helping some.  Denies any numbness or tingling throughout the left lower extremity.  Has not been up out of bed yet since surgery.  No chest pain. No SOB. No nausea/vomiting. No other complaints.  Patient lives at home alone in [place], [place].  Asking about getting a home health aide at discharge.  OBJECTIVE:  Vitals:   03/05/23 1952 03/06/23 0525  BP: (!) 140/55 (!) 124/48  Pulse: 80 76  Resp: 16 16  Temp: 98.1 F (36.7 C) 97.9 F (36.6 C)  SpO2: 95% 93%    General: Sitting up in bed, no acute distress.  Pleasant and cooperative Respiratory: No increased work of breathing.  Left lower extremity: Dressing clean, dry, intact.  Tenderness about the knee and proximal tibia as expected.  Knee range of motion limited secondary to pain.  Tolerates gentle ankle dorsiflexion/plantarflexion.  Able to wiggle the toes.  Dorsal sensation light touch over the foot. + DP pulse  IMAGING: Stable post op imaging.   LABS:  Results for orders placed or performed during the hospital encounter of 03/05/23 (from the past 24 hours)  Glucose, capillary     Status: Abnormal   Collection Time: 03/05/23 12:02 PM  Result Value Ref Range   Glucose-Capillary 164 (H) 70 - 99 mg/dL   Comment 1 Notify RN   VITAMIN D 25 Hydroxy (Vit-D Deficiency, Fractures)     Status: None   Collection Time: 03/05/23  3:00 PM  Result Value Ref Range   Vit D, 25-Hydroxy 32.50 30 - 100 ng/mL  Glucose, capillary     Status: Abnormal   Collection Time: 03/05/23  4:25 PM  Result Value Ref Range   Glucose-Capillary 182 (H) 70 - 99 mg/dL  Glucose, capillary     Status: Abnormal   Collection Time: 03/05/23  9:20 PM  Result Value Ref Range   Glucose-Capillary 161 (H) 70 - 99 mg/dL  CBC     Status: Abnormal   Collection Time: 03/06/23  5:47 AM   Result Value Ref Range   WBC 13.4 (H) 4.0 - 10.5 K/uL   RBC 2.93 (L) 3.87 - 5.11 MIL/uL   Hemoglobin 7.9 (L) 12.0 - 15.0 g/dL   HCT 16.1 (L) 09.6 - 04.5 %   MCV 86.0 80.0 - 100.0 fL   MCH 27.0 26.0 - 34.0 pg   MCHC 31.3 30.0 - 36.0 g/dL   RDW 40.9 81.1 - 91.4 %   Platelets 254 150 - 400 K/uL   nRBC 0.0 0.0 - 0.2 %  Basic metabolic panel     Status: Abnormal   Collection Time: 03/06/23  5:47 AM  Result Value Ref Range   Sodium 134 (L) 135 - 145 mmol/L   Potassium 3.8 3.5 - 5.1 mmol/L   Chloride 104 98 - 111 mmol/L   CO2 25 22 - 32 mmol/L   Glucose, Bld 138 (H) 70 - 99 mg/dL   BUN 15 8 - 23 mg/dL   Creatinine, Ser 7.82 (H) 0.44 - 1.00 mg/dL   Calcium 8.5 (L) 8.9 - 10.3 mg/dL   GFR, Estimated 38 (L) >60 mL/min   Anion gap 5 5 - 15  Glucose, capillary     Status: Abnormal   Collection Time: 03/06/23  5:54 AM  Result Value Ref Range   Glucose-Capillary 131 (H)  70 - 99 mg/dL  Glucose, capillary     Status: Abnormal   Collection Time: 03/06/23  8:16 AM  Result Value Ref Range   Glucose-Capillary 137 (H) 70 - 99 mg/dL    ASSESSMENT: Tonya Carpenter is a 77 y.o. female, 1 Day Post-Op s/p OPEN REDUCTION INTERNAL FIXATION LEFT TIBIAL PLATEAU  CV/Blood loss: Acute blood loss anemia, Hgb 7.9 this morning. Hemodynamically stable  PLAN: Weightbearing: NWB LLE ROM: Okay for unrestricted motion of the knee.  Wear knee immobilizer at night Incisional and dressing care: Reinforce dressings as needed  Showering: Okay to begin showering getting incisions wet 03/08/2023 Orthopedic device(s): Knee immobilizer at night for first 2 weeks Pain management:  1. Tylenol 1000 mg q 6 hours scheduled 2. Robaxin 500 mg q 6 hours PRN 3. Oxycodone 5 mg q 4 hours PRN 4. Dilaudid 0.5-1 mg q 4 hours PRN VTE prophylaxis: Aspirin 81 mg, SCDs ID:  Ancef 2gm post op Foley/Lines:  No foley, KVO IVFs Impediments to Fracture Healing: Vitamin D level low normal at 32.5, started on vitamin D  supplementation Dispo: PT/OT evaluation today, dispo pending.  Patient will likely require SNF at discharge.  She is in agreement with this currently.  Continue to monitor CBC.  Plan to remove dressing LLE tomorrow 03/07/2023   D/C recommendations: -Oxycodone, Tylenol, Robaxin for pain control -Aspirin 81 mg for DVT prophylaxis -Continue 1000 units Vit D supplementation daily x 90 days  Follow - up plan: 2 weeks after discharge for wound check and repeat x-rays   Contact information:  Truitt Merle MD, Thyra Breed PA-C. After hours and holidays please check [email or web address] for group call information for [facility]   Thompson Caul, PA-C [phone] (office) [email or web address]

## 2023-03-06 NOTE — Evaluation (Signed)
Physical Therapy Evaluation  Patient Details Name: Tonya Carpenter MRN: [id number] DOB: [birth year] Today's Date: 03/06/2023  History of Present Illness  Pt is a 77 y/o female who presents 03/05/2023 s/p fall at home. She sustained a L tibial plateau fx and is now s/p ORIF on 03/05/2023. PMH significant for Anxiety, chronic R shoulder pain, CAD, DOE, HTN, memory changes (2023), CVA, DM II.   Clinical Impression  Pt admitted with above diagnosis. Pt currently with functional limitations due to the deficits listed below (see PT Problem List). At the time of PT eval pt was able to perform transfers with up to +2 max assist. Attempted RW however pt with increased anxiety, and 2 person assist with gait belt a safer option. Pt with increased difficulty maintaining NWB status on the LLE during standing activity. Recommend post-acute therapy <3 hours/day to maximize functional independence, safety, and facilitate return home with intermittent support from son. Acutely, pt will benefit from skilled PT to increase their independence and safety with mobility to allow discharge.           If plan is discharge home, recommend the following: Two people to help with walking and/or transfers;Two people to help with bathing/dressing/bathroom;Assistance with cooking/housework;Direct supervision/assist for medications management;Direct supervision/assist for financial management;Assist for transportation;Help with stairs or ramp for entrance;Supervision due to cognitive status   Can travel by private vehicle   No    Equipment Recommendations Wheelchair (measurements PT);Wheelchair cushion (measurements PT)  Recommendations for Other Services       Functional Status Assessment Patient has had a recent decline in their functional status and demonstrates the ability to make significant improvements in function in a reasonable and predictable amount of time.     Precautions / Restrictions Precautions Precautions:  Fall Required Braces or Orthoses: Knee Immobilizer - Left (At night) Restrictions Weight Bearing Restrictions Per Provider Order: Yes LLE Weight Bearing Per Provider Order: Non weight bearing Other Position/Activity Restrictions: KI at night      Mobility  Bed Mobility Overal bed mobility: Needs Assistance Bed Mobility: Supine to Sit     Supine to sit: Max assist, +2 for physical assistance, HOB elevated, Used rails     General bed mobility comments: Hand over hand assist to reach for railings. +2 max assist for LE advancement towards EOB, scooting hips with bed pad, and elevating trunk to full sitting position on EOB. Once sitting, pt able to scoot forward and get feet on the floor.    Transfers Overall transfer level: Needs assistance Equipment used: 2 person hand held assist Transfers: Sit to/from Stand, Bed to chair/wheelchair/BSC Sit to Stand: Max assist, +2 physical assistance     Squat pivot transfers: Max assist, +2 physical assistance     General transfer comment: +2 max assist to clear hips from EOB and pivot around to [facility]. Therapist maintained a foot under the LLE throughout to ensure pt maintaining NWB status. Pt with difficulty keeping foot up off the floor. From [facility], +2 required for stand while a 3rd person removed BSC and replaced it with the recliner for pt to sit.    Ambulation/Gait               General Gait Details: Unable to progress to gait training this session.  Stairs            Wheelchair Mobility     Tilt Bed    Modified Rankin (Stroke Patients Only)       Balance Overall balance assessment:  Needs assistance Sitting-balance support: No upper extremity supported, Feet supported Sitting balance-Leahy Scale: Fair     Standing balance support: Bilateral upper extremity supported, During functional activity, Reliant on assistive device for balance Standing balance-Leahy Scale: Zero Standing balance comment: +2 required                              Pertinent Vitals/Pain Pain Assessment Pain Assessment: Faces Faces Pain Scale: Hurts whole lot Pain Location: L LE Pain Descriptors / Indicators: Operative site guarding, Sore, Moaning, Grimacing Pain Intervention(s): Limited activity within patient's tolerance, Monitored during session, Repositioned    Home Living Family/patient expects to be discharged to:: Private residence Living Arrangements: Children Available Help at Discharge: Family;Available PRN/intermittently (son lives with her) Type of Home: Apartment Home Access: Level entry       Home Layout: One level Home Equipment: Agricultural consultant (2 wheels);Rollator (4 wheels);Wheelchair - manual      Prior Function Prior Level of Function : Independent/Modified Independent             Mobility Comments: using RW for mobility, w/c since fall ADLs Comments: reports independent before fall, needing more assist since fall and completing basin baths with support from family     Extremity/Trunk Assessment   Upper Extremity Assessment Upper Extremity Assessment: Generalized weakness    Lower Extremity Assessment Lower Extremity Assessment: Defer to PT evaluation LLE Deficits / Details: Decreased strength and AROM consistent with above mentioned injury and subsequent surgery. LLE: Unable to fully assess due to pain LLE Coordination: decreased fine motor;decreased gross motor    Cervical / Trunk Assessment Cervical / Trunk Assessment: Normal;Other exceptions Cervical / Trunk Exceptions: Forward head posture with rounded shoulders  Communication   Communication Communication: No apparent difficulties Cueing Techniques: Verbal cues;Gestural cues  Cognition Arousal: Alert Behavior During Therapy: Anxious Overall Cognitive Status: Impaired/Different from baseline Area of Impairment: Orientation, Attention, Memory, Following commands, Safety/judgement, Awareness, Problem solving                  Orientation Level: Disoriented to, Place, Time Current Attention Level: Sustained Memory: Decreased recall of precautions Following Commands: Follows one step commands with increased time, Follows one step commands inconsistently Safety/Judgement: Decreased awareness of safety Awareness: Intellectual Problem Solving: Slow processing, Difficulty sequencing, Requires verbal cues, Decreased initiation          General Comments      Exercises     Assessment/Plan    PT Assessment Patient needs continued PT services  PT Problem List Decreased strength;Decreased range of motion;Decreased activity tolerance;Decreased mobility;Decreased balance;Decreased knowledge of use of DME;Decreased safety awareness;Decreased knowledge of precautions;Pain       PT Treatment Interventions DME instruction;Gait training;Functional mobility training;Therapeutic activities;Therapeutic exercise;Balance training;Patient/family education    PT Goals (Current goals can be found in the Care Plan section)  Acute Rehab PT Goals Patient Stated Goal: Be able to return to her home PT Goal Formulation: With patient Time For Goal Achievement: 03/20/23 Potential to Achieve Goals: Good    Frequency Min 1X/week     Co-evaluation PT/OT/SLP Co-Evaluation/Treatment: Yes Reason for Co-Treatment: Complexity of the patient's impairments (multi-system involvement);Necessary to address cognition/behavior during functional activity;For patient/therapist safety;To address functional/ADL transfers PT goals addressed during session: Mobility/safety with mobility OT goals addressed during session: ADL's and self-care       AM-PAC PT "6 Clicks" Mobility  Outcome Measure Help needed turning from your back to your side while in a  flat bed without using bedrails?: Total Help needed moving from lying on your back to sitting on the side of a flat bed without using bedrails?: Total Help needed moving to and from a  bed to a chair (including a wheelchair)?: Total Help needed standing up from a chair using your arms (e.g., wheelchair or bedside chair)?: Total Help needed to walk in hospital room?: Total Help needed climbing 3-5 steps with a railing? : Total 6 Click Score: 6    End of Session Equipment Utilized During Treatment: Gait belt Activity Tolerance: Patient limited by pain Patient left: in chair;with call bell/phone within reach;with chair alarm set Nurse Communication: Mobility status (RN assisting at end of session and observed mobility) PT Visit Diagnosis: Unsteadiness on feet (R26.81);History of falling (Z91.81);Difficulty in walking, not elsewhere classified (R26.2);Pain Pain - Right/Left: Left Pain - part of body: Leg;Knee    Time: 7253-6644 PT Time Calculation (min) (ACUTE ONLY): 28 min   Charges:   PT Evaluation $PT Eval Moderate Complexity: 1 Mod   PT General Charges $$ ACUTE PT VISIT: 1 Visit         Tonya Carpenter, PT, DPT Acute Rehabilitation Services Secure Chat Preferred Office: [phone]   Tonya Carpenter 03/06/2023, 2:40 PM

## 2023-03-07 LAB — CBC
HCT: 26.9 % — ABNORMAL LOW (ref 36.0–46.0)
Hemoglobin: 8.5 g/dL — ABNORMAL LOW (ref 12.0–15.0)
MCH: 27.2 pg (ref 26.0–34.0)
MCHC: 31.6 g/dL (ref 30.0–36.0)
MCV: 85.9 fL (ref 80.0–100.0)
Platelets: 261 10*3/uL (ref 150–400)
RBC: 3.13 MIL/uL — ABNORMAL LOW (ref 3.87–5.11)
RDW: 13.9 % (ref 11.5–15.5)
WBC: 14.8 10*3/uL — ABNORMAL HIGH (ref 4.0–10.5)
nRBC: 0 % (ref 0.0–0.2)

## 2023-03-07 LAB — BASIC METABOLIC PANEL
Anion gap: 7 (ref 5–15)
BUN: 14 mg/dL (ref 8–23)
CO2: 24 mmol/L (ref 22–32)
Calcium: 8.6 mg/dL — ABNORMAL LOW (ref 8.9–10.3)
Chloride: 102 mmol/L (ref 98–111)
Creatinine, Ser: 1.28 mg/dL — ABNORMAL HIGH (ref 0.44–1.00)
GFR, Estimated: 43 mL/min — ABNORMAL LOW (ref >60)
Glucose, Bld: 151 mg/dL — ABNORMAL HIGH (ref 70–99)
Potassium: 3.7 mmol/L (ref 3.5–5.1)
Sodium: 133 mmol/L — ABNORMAL LOW (ref 135–145)

## 2023-03-07 LAB — GLUCOSE, CAPILLARY
Glucose-Capillary: 131 mg/dL — ABNORMAL HIGH (ref 70–99)
Glucose-Capillary: 150 mg/dL — ABNORMAL HIGH (ref 70–99)
Glucose-Capillary: 174 mg/dL — ABNORMAL HIGH (ref 70–99)
Glucose-Capillary: 119 mg/dL — ABNORMAL HIGH (ref 70–99)
Glucose-Capillary: 136 mg/dL — ABNORMAL HIGH (ref 70–99)

## 2023-03-07 MED ORDER — OXYCODONE HCL 5 MG PO TABS: 5.0000 mg | ORAL_TABLET | ORAL | 0 refills | Status: DC | PRN

## 2023-03-07 MED ORDER — SODIUM CHLORIDE 1 G PO TABS
1.0000 g | ORAL_TABLET | Freq: Two times a day (BID) | ORAL | Status: DC
  Administered 2023-03-07 – 2023-03-08 (×4): 1 g via ORAL
  Filled 2023-03-07 (×4): qty 1

## 2023-03-07 MED ORDER — METHOCARBAMOL 500 MG PO TABS: 500.0000 mg | ORAL_TABLET | Freq: Four times a day (QID) | ORAL | 0 refills | Status: DC | PRN

## 2023-03-07 NOTE — Discharge Instructions (Signed)
Orthopaedic Trauma Service Discharge Instructions   General Discharge Instructions  WEIGHT BEARING STATUS:non-weightbearing left leg  RANGE OF MOTION/ACTIVITY: Ok for knee motion as tolerated  Wound Care: You may remove your surgical dressing. Incisions can be left open to air if there is no drainage. Once the incision is completely dry and without drainage, it may be left open to air out.  Showering may begin post op day 3, (Saturday 03/08/23).  Clean incision gently with soap and water.  DVT/PE prophylaxis: Aspirin 81 mg  Diet: as you were eating previously.  Can use over the counter stool softeners and bowel preparations, such as Miralax, to help with bowel movements.  Narcotics can be constipating.  Be sure to drink plenty of fluids  PAIN MEDICATION USE AND EXPECTATIONS  You have likely been given narcotic medications to help control your pain.  After a traumatic event that results in an fracture (broken bone) with or without surgery, it is ok to use narcotic pain medications to help control one's pain.  We understand that everyone responds to pain differently and each individual patient will be evaluated on a regular basis for the continued need for narcotic medications. Ideally, narcotic medication use should last no more than 6-8 weeks (coinciding with fracture healing).   As a patient it is your responsibility as well to monitor narcotic medication use and report the amount and frequency you use these medications when you come to your office visit.   We would also advise that if you are using narcotic medications, you should take a dose prior to therapy to maximize you participation.  IF YOU ARE ON NARCOTIC MEDICATIONS IT IS NOT PERMISSIBLE TO OPERATE A MOTOR VEHICLE (MOTORCYCLE/CAR/TRUCK/MOPED) OR HEAVY MACHINERY DO NOT MIX NARCOTICS WITH OTHER CNS (CENTRAL NERVOUS SYSTEM) DEPRESSANTS SUCH AS ALCOHOL   STOP SMOKING OR USING NICOTINE PRODUCTS!!!!  As discussed nicotine severely  impairs your body's ability to heal surgical and traumatic wounds but also impairs bone healing.  Wounds and bone heal by forming microscopic blood vessels (angiogenesis) and nicotine is a vasoconstrictor (essentially, shrinks blood vessels).  Therefore, if vasoconstriction occurs to these microscopic blood vessels they essentially disappear and are unable to deliver necessary nutrients to the healing tissue.  This is one modifiable factor that you can do to dramatically increase your chances of healing your injury.    (This means no smoking, no nicotine gum, patches, etc)  DO NOT USE NONSTEROIDAL ANTI-INFLAMMATORY DRUGS (NSAID'S)  Using products such as Advil (ibuprofen), Aleve (naproxen), Motrin (ibuprofen) for additional pain control during fracture healing can delay and/or prevent the healing response.  If you would like to take over the counter (OTC) medication, Tylenol (acetaminophen) is ok.  However, some narcotic medications that are given for pain control contain acetaminophen as well. Therefore, you should not exceed more than 4000 mg of tylenol in a day if you do not have liver disease.  Also note that there are may OTC medicines, such as cold medicines and allergy medicines that my contain tylenol as well.  If you have any questions about medications and/or interactions please ask your doctor/PA or your pharmacist.      ICE AND ELEVATE INJURED/OPERATIVE EXTREMITY  Using ice and elevating the injured extremity above your heart can help with swelling and pain control.  Icing in a pulsatile fashion, such as 20 minutes on and 20 minutes off, can be followed.    Do not place ice directly on skin. Make sure there is a barrier  between to skin and the ice pack.    Using frozen items such as frozen peas works well as the conform nicely to the are that needs to be iced.  USE AN ACE WRAP OR TED HOSE FOR SWELLING CONTROL  In addition to icing and elevation, Ace wraps or TED hose are used to help limit  and resolve swelling.  It is recommended to use Ace wraps or TED hose until you are informed to stop.    When using Ace Wraps start the wrapping distally (farthest away from the body) and wrap proximally (closer to the body)   Example: If you had surgery on your leg or thing and you do not have a splint on, start the ace wrap at the toes and work your way up to the thigh        If you had surgery on your upper extremity and do not have a splint on, start the ace wrap at your fingers and work your way up to the upper arm   CALL THE OFFICE WITH ANY QUESTIONS OR CONCERNS: [phone]   VISIT OUR WEBSITE FOR ADDITIONAL INFORMATION: [email or web address]     Discharge Wound Care Instructions  Do NOT apply any ointments, solutions or lotions to pin sites or surgical wounds.  These prevent needed drainage and even though solutions like hydrogen peroxide kill bacteria, they also damage cells lining the pin sites that help fight infection.  Applying lotions or ointments can keep the wounds moist and can cause them to breakdown and open up as well. This can increase the risk for infection. When in doubt call the office.  Surgical incisions should be dressed daily.  If any drainage is noted, use one layer of adaptic or Mepitel, then gauze, Kerlix, and an ace wrap. - These dressing supplies should be available at local medical supply stores [facility], [facility], etc) as well as Insurance claims handler ([facility], [facility], [place], etc)  Once the incision is completely dry and without drainage, it may be left open to air out.  Showering may begin 36-48 hours later.  Cleaning gently with soap and water.

## 2023-03-07 NOTE — Progress Notes (Addendum)
Orthopaedic Trauma Progress Note  SUBJECTIVE: Doing okay this morning. Appears a little confused but able to answer questions appropriately.  Notes soreness through the left leg.  We discussed given her injury and surgery this is to be expected.  Pain medications helping some.  Denies any numbness or tingling throughout the left lower extremity.  States PT went fair yesterday. No chest pain. No SOB. No nausea/vomiting. No other complaints.  No family at bedside. Patient agreeable to SNF.  Tolerating diet and fluids, will saline lock IV. Morning labs shows hyponatremia, add salt tabs today  OBJECTIVE:  Vitals:   03/07/23 0624 03/07/23 0829  BP: (!) 131/38 136/61  Pulse: 87 91  Resp: 17 18  Temp: 98 F (36.7 C) 99.9 F (37.7 C)  SpO2: 96% 99%    General: Sitting up in bed, no acute distress.  Pleasant and cooperative Respiratory: No increased work of breathing.  Left lower extremity: Dressings changed, incisions are clean, dry, intact.  Tenderness about the knee and proximal tibia as expected.  Knee range of motion limited secondary to pain.  Tolerates gentle ankle dorsiflexion/plantarflexion.  Able to wiggle the toes.  Dorsal sensation light touch over the foot. + DP pulse  IMAGING: Stable post op imaging.   LABS:  Results for orders placed or performed during the hospital encounter of 03/05/23 (from the past 24 hours)  Glucose, capillary     Status: Abnormal   Collection Time: 03/06/23 11:47 AM  Result Value Ref Range   Glucose-Capillary 159 (H) 70 - 99 mg/dL  Glucose, capillary     Status: Abnormal   Collection Time: 03/06/23  4:14 PM  Result Value Ref Range   Glucose-Capillary 143 (H) 70 - 99 mg/dL  Glucose, capillary     Status: Abnormal   Collection Time: 03/06/23  9:49 PM  Result Value Ref Range   Glucose-Capillary 132 (H) 70 - 99 mg/dL  CBC     Status: Abnormal   Collection Time: 03/07/23  6:22 AM  Result Value Ref Range   WBC 14.8 (H) 4.0 - 10.5 K/uL   RBC 3.13 (L)  3.87 - 5.11 MIL/uL   Hemoglobin 8.5 (L) 12.0 - 15.0 g/dL   HCT 19.1 (L) 47.8 - 29.5 %   MCV 85.9 80.0 - 100.0 fL   MCH 27.2 26.0 - 34.0 pg   MCHC 31.6 30.0 - 36.0 g/dL   RDW 62.1 30.8 - 65.7 %   Platelets 261 150 - 400 K/uL   nRBC 0.0 0.0 - 0.2 %  Glucose, capillary     Status: Abnormal   Collection Time: 03/07/23  6:27 AM  Result Value Ref Range   Glucose-Capillary 131 (H) 70 - 99 mg/dL  Basic metabolic panel     Status: Abnormal   Collection Time: 03/07/23  8:13 AM  Result Value Ref Range   Sodium 133 (L) 135 - 145 mmol/L   Potassium 3.7 3.5 - 5.1 mmol/L   Chloride 102 98 - 111 mmol/L   CO2 24 22 - 32 mmol/L   Glucose, Bld 151 (H) 70 - 99 mg/dL   BUN 14 8 - 23 mg/dL   Creatinine, Ser 8.46 (H) 0.44 - 1.00 mg/dL   Calcium 8.6 (L) 8.9 - 10.3 mg/dL   GFR, Estimated 43 (L) >60 mL/min   Anion gap 7 5 - 15  Glucose, capillary     Status: Abnormal   Collection Time: 03/07/23  8:28 AM  Result Value Ref Range   Glucose-Capillary 150 (H)  70 - 99 mg/dL    ASSESSMENT: Tonya Carpenter is a 77 y.o. female, 2 Days Post-Op s/p OPEN REDUCTION INTERNAL FIXATION LEFT TIBIAL PLATEAU  CV/Blood loss: Acute blood loss anemia, Hgb 8.5 this morning. Hemodynamically stable  PLAN: Weightbearing: NWB LLE ROM: Okay for unrestricted motion of the knee.  Wear knee immobilizer at night Incisional and dressing care: Changed today, continue to change PRN Showering: Okay to begin showering getting incisions wet 03/08/2023 Orthopedic device(s): Knee immobilizer at night for first 2 weeks Pain management:  1. Tylenol 1000 mg q 6 hours scheduled 2. Robaxin 500 mg q 6 hours PRN 3. Oxycodone 5 mg q 4 hours PRN 4. Dilaudid 0.5-1 mg q 4 hours PRN VTE prophylaxis: Aspirin 81 mg, SCDs ID:  Ancef 2gm post op completed Foley/Lines:  No foley, KVO IVFs Impediments to Fracture Healing: Vitamin D level low normal at 32.5, continue home dose vitamin D3 supplementation Dispo: PT/OT evaluation ongoing, recommending SNF  at discharge.  She is in agreement with this currently.  Continue to monitor CBC.  Expect patient will be medically ready for discharge in next 24-48 hours  D/C recommendations: -Oxycodone, Tylenol, Robaxin for pain control -Aspirin 81 mg for DVT prophylaxis -Continue 1000 units Vit D supplementation daily x 90 days  Follow - up plan: 2 weeks after discharge for wound check and repeat x-rays   Contact information:  Truitt Merle MD, Thyra Breed PA-C. After hours and holidays please check [email or web address] for group call information for [facility]   Thompson Caul, PA-C [phone] (office) [email or web address]

## 2023-03-07 NOTE — TOC Progression Note (Signed)
Transition of Care [facility]) - Progression Note    Patient Details  Name: Tonya Carpenter MRN: [id number] Date of Birth: September 16, 1945  [facility]) CM/SW Contact  Dellie Burns [place], [place] Phone Number: 03/07/2023, 9:41 AM  Clinical Narrative: pt with some confusion this AM. Provided current SNF offers to pt's cousin April at provided email [email or web address]. Will f/u back up re SNF choice later today.    Dellie Burns, MSW, LCSW [phone] (coverage)        Expected Discharge Plan: Skilled Nursing Facility Barriers to Discharge: Continued Medical Work up, SNF Pending bed offer, Insurance Authorization  Expected Discharge Plan and Services     Post Acute Care Choice: Skilled Nursing Facility Living arrangements for the past 2 months: Apartment                                       Social Determinants of Health (SDOH) Interventions SDOH Screenings   Food Insecurity: Food Insecurity Present (03/05/2023)  Housing: Low Risk  (03/05/2023)  Transportation Needs: No Transportation Needs (03/05/2023)  Utilities: Not At Risk (03/05/2023)  Alcohol Screen: Low Risk  (06/28/2021)  Depression (PHQ2-9): Medium Risk (02/26/2023)  Financial Resource Strain: Low Risk  (07/05/2022)  Physical Activity: Insufficiently Active (07/05/2022)  Social Connections: Moderately Integrated (07/05/2022)  Stress: No Stress Concern Present (07/05/2022)  Tobacco Use: Low Risk  (03/05/2023)    Readmission Risk Interventions     No data to display

## 2023-03-07 NOTE — Plan of Care (Signed)

## 2023-03-07 NOTE — Progress Notes (Signed)
Patient requesting to get back into bed from chair, asking to use BSC first. Patient stood and pivoted to [facility] without difficulty. When given the walker to transfer to bed. Patient able to stand on right leg but when reminded of NWB status to left leg, patient starts to scream and cry yelling "I can't do this" and sits back onto [facility]. This nurse educated patient on risk of bearing weight onto left leg and patient continues to cry stating "I just can't do this." Byrd Hesselbach, NT entered room to assist and with walker and x2 assist, staff was able to transfer patient back to bed. Patient still applied weight on left leg despite staff redirecting. Patient back in bed with bed alarm on, call bell and personal belongings within reach.

## 2023-03-07 NOTE — Progress Notes (Signed)
Called into patient's room for bathroom assistance. Per patient, she is unable to get up and request use of bedpan. Patient able to roll onto left side but difficulty turning to right side. Voided in bedpan, pericare provided. Patient offered pain medication but patient declined at this time. Bed alarm on, call bell and personal belongings within reach.

## 2023-03-07 NOTE — Progress Notes (Signed)
Pt experienced confusion throughout night and was alert to self only. Noted to make remarks about calling police and feeling unsafe in her room. Provided positive reinforcement and reoriented patient several times and added some light to her room as she once requested to be in the dark. Pt refused tv at this time and other diversional activities. Offered beverage/snack. Pt refused. Bedpan utilized for pt at this time. Charge nurse aware. Bed locked in lowest position, belongings and call bell in reach.

## 2023-03-08 LAB — GLUCOSE, CAPILLARY
Glucose-Capillary: 180 mg/dL — ABNORMAL HIGH (ref 70–99)
Glucose-Capillary: 115 mg/dL — ABNORMAL HIGH (ref 70–99)
Glucose-Capillary: 136 mg/dL — ABNORMAL HIGH (ref 70–99)
Glucose-Capillary: 101 mg/dL — ABNORMAL HIGH (ref 70–99)

## 2023-03-08 LAB — CBC
HCT: 26.2 % — ABNORMAL LOW (ref 36.0–46.0)
Hemoglobin: 8 g/dL — ABNORMAL LOW (ref 12.0–15.0)
MCH: 26.7 pg (ref 26.0–34.0)
MCHC: 30.5 g/dL (ref 30.0–36.0)
MCV: 87.3 fL (ref 80.0–100.0)
Platelets: 261 10*3/uL (ref 150–400)
RBC: 3 MIL/uL — ABNORMAL LOW (ref 3.87–5.11)
RDW: 14 % (ref 11.5–15.5)
WBC: 11.8 10*3/uL — ABNORMAL HIGH (ref 4.0–10.5)
nRBC: 0 % (ref 0.0–0.2)

## 2023-03-08 LAB — BASIC METABOLIC PANEL
Anion gap: 7 (ref 5–15)
BUN: 23 mg/dL (ref 8–23)
CO2: 25 mmol/L (ref 22–32)
Calcium: 8.5 mg/dL — ABNORMAL LOW (ref 8.9–10.3)
Chloride: 102 mmol/L (ref 98–111)
Creatinine, Ser: 1.75 mg/dL — ABNORMAL HIGH (ref 0.44–1.00)
GFR, Estimated: 30 mL/min — ABNORMAL LOW (ref >60)
Glucose, Bld: 111 mg/dL — ABNORMAL HIGH (ref 70–99)
Potassium: 3.7 mmol/L (ref 3.5–5.1)
Sodium: 134 mmol/L — ABNORMAL LOW (ref 135–145)

## 2023-03-08 MED ORDER — DEXTROSE-SODIUM CHLORIDE 5-0.45 % IV SOLN: INTRAVENOUS | Status: AC

## 2023-03-08 NOTE — Plan of Care (Signed)
  Problem: Education: Goal: Knowledge of General Education information will improve Description: Including pain rating scale, medication(s)/side effects and non-pharmacologic comfort measures Outcome: Progressing   Problem: Clinical Measurements: Goal: Ability to maintain clinical measurements within normal limits will improve Outcome: Progressing   Problem: Activity: Goal: Risk for activity intolerance will decrease Outcome: Progressing   Problem: Nutrition: Goal: Adequate nutrition will be maintained Outcome: Progressing   Problem: Coping: Goal: Level of anxiety will decrease Outcome: Progressing   Problem: Elimination: Goal: Will not experience complications related to bowel motility Outcome: Progressing   Problem: Education: Goal: Knowledge of General Education information will improve Description: Including pain rating scale, medication(s)/side effects and non-pharmacologic comfort measures Outcome: Progressing   Problem: Clinical Measurements: Goal: Ability to maintain clinical measurements within normal limits will improve Outcome: Progressing   Problem: Activity: Goal: Risk for activity intolerance will decrease Outcome: Progressing   Problem: Nutrition: Goal: Adequate nutrition will be maintained Outcome: Progressing   Problem: Coping: Goal: Level of anxiety will decrease Outcome: Progressing   Problem: Elimination: Goal: Will not experience complications related to bowel motility Outcome: Progressing

## 2023-03-08 NOTE — Progress Notes (Signed)
Orthopaedic Trauma Progress Note  SUBJECTIVE: Doing okay this morning.  Continues to have some soreness to the left leg which discussed is normal given her injury and surgery. Denies any numbness or tingling throughout the left lower extremity.  States PT went fair yesterday. No chest pain. No SOB. No nausea/vomiting. No other complaints.  No family at bedside. Patient agreeable to SNF.  Tolerating diet and fluids. Morning labs show continued hyponatremia and increase serum creatinine, plan to continue with salt tabs and restart IV fluids today  OBJECTIVE:  Vitals:   03/07/23 2015 03/08/23 0350  BP: (!) 114/52 (!) 106/56  Pulse: 75 71  Resp: 16 16  Temp: 98.6 F (37 C) 97.7 F (36.5 C)  SpO2: 99% 99%    General: Sitting up in bed, no acute distress.  Pleasant and cooperative Respiratory: No increased work of breathing.  Left lower extremity: Mepilex dressings are clean, dry, intact.  Tenderness about the knee and proximal tibia as expected.  Knee range of motion limited secondary to pain.  Tolerates gentle ankle dorsiflexion/plantarflexion.  Able to wiggle the toes.  Dorsal sensation light touch over the foot. + DP pulse  IMAGING: Stable post op imaging.   LABS:  Results for orders placed or performed during the hospital encounter of 03/05/23 (from the past 24 hours)  Basic metabolic panel     Status: Abnormal   Collection Time: 03/07/23  8:13 AM  Result Value Ref Range   Sodium 133 (L) 135 - 145 mmol/L   Potassium 3.7 3.5 - 5.1 mmol/L   Chloride 102 98 - 111 mmol/L   CO2 24 22 - 32 mmol/L   Glucose, Bld 151 (H) 70 - 99 mg/dL   BUN 14 8 - 23 mg/dL   Creatinine, Ser 2.95 (H) 0.44 - 1.00 mg/dL   Calcium 8.6 (L) 8.9 - 10.3 mg/dL   GFR, Estimated 43 (L) >60 mL/min   Anion gap 7 5 - 15  Glucose, capillary     Status: Abnormal   Collection Time: 03/07/23  8:28 AM  Result Value Ref Range   Glucose-Capillary 150 (H) 70 - 99 mg/dL  Glucose, capillary     Status: Abnormal    Collection Time: 03/07/23 11:24 AM  Result Value Ref Range   Glucose-Capillary 174 (H) 70 - 99 mg/dL  Glucose, capillary     Status: Abnormal   Collection Time: 03/07/23  4:41 PM  Result Value Ref Range   Glucose-Capillary 119 (H) 70 - 99 mg/dL  Glucose, capillary     Status: Abnormal   Collection Time: 03/07/23  9:08 PM  Result Value Ref Range   Glucose-Capillary 136 (H) 70 - 99 mg/dL  CBC     Status: Abnormal   Collection Time: 03/08/23  5:15 AM  Result Value Ref Range   WBC 11.8 (H) 4.0 - 10.5 K/uL   RBC 3.00 (L) 3.87 - 5.11 MIL/uL   Hemoglobin 8.0 (L) 12.0 - 15.0 g/dL   HCT 62.1 (L) 30.8 - 65.7 %   MCV 87.3 80.0 - 100.0 fL   MCH 26.7 26.0 - 34.0 pg   MCHC 30.5 30.0 - 36.0 g/dL   RDW 84.6 96.2 - 95.2 %   Platelets 261 150 - 400 K/uL   nRBC 0.0 0.0 - 0.2 %  Basic metabolic panel     Status: Abnormal   Collection Time: 03/08/23  5:15 AM  Result Value Ref Range   Sodium 134 (L) 135 - 145 mmol/L   Potassium 3.7  3.5 - 5.1 mmol/L   Chloride 102 98 - 111 mmol/L   CO2 25 22 - 32 mmol/L   Glucose, Bld 111 (H) 70 - 99 mg/dL   BUN 23 8 - 23 mg/dL   Creatinine, Ser 3.24 (H) 0.44 - 1.00 mg/dL   Calcium 8.5 (L) 8.9 - 10.3 mg/dL   GFR, Estimated 30 (L) >60 mL/min   Anion gap 7 5 - 15    ASSESSMENT: Tonya Carpenter is a 77 y.o. female, 3 Days Post-Op s/p OPEN REDUCTION INTERNAL FIXATION LEFT TIBIAL PLATEAU  CV/Blood loss: Acute blood loss anemia, Hgb 8.0 this morning. Hemodynamically stable  PLAN: Weightbearing: NWB LLE ROM: Okay for unrestricted motion of the knee.  Wear knee immobilizer at night Incisional and dressing care: change PRN Showering: Okay to begin showering getting incisions wet 03/08/2023 Orthopedic device(s): Knee immobilizer at night for first 2 weeks Pain management:  1. Tylenol 1000 mg q 6 hours scheduled 2. Robaxin 500 mg q 6 hours PRN 3. Oxycodone 5 mg q 4 hours PRN 4. Dilaudid 0.5-1 mg q 4 hours PRN VTE prophylaxis: Aspirin 81 mg, SCDs ID:  Ancef 2gm post  op completed Foley/Lines:  No foley, KVO IVFs Impediments to Fracture Healing: Vitamin D level low normal at 32.5, continue home dose vitamin D3 supplementation Dispo: PT/OT evaluation ongoing, recommending SNF at discharge.  She is in agreement with this currently.  Recheck CBC/BMP tomorrow 03/09/2023.  D/C recommendations: -Oxycodone, Tylenol, Robaxin for pain control -Aspirin 81 mg for DVT prophylaxis -Continue 1000 units Vit D supplementation daily x 90 days  Follow - up plan: 2 weeks after discharge for wound check and repeat x-rays   Contact information:  Truitt Merle MD, Thyra Breed PA-C. After hours and holidays please check [email or web address] for group call information for [facility]   Thompson Caul, PA-C [phone] (office) [email or web address]

## 2023-03-08 NOTE — Plan of Care (Signed)

## 2023-03-09 LAB — GLUCOSE, CAPILLARY
Glucose-Capillary: 125 mg/dL — ABNORMAL HIGH (ref 70–99)
Glucose-Capillary: 180 mg/dL — ABNORMAL HIGH (ref 70–99)
Glucose-Capillary: 131 mg/dL — ABNORMAL HIGH (ref 70–99)
Glucose-Capillary: 116 mg/dL — ABNORMAL HIGH (ref 70–99)
Glucose-Capillary: 190 mg/dL — ABNORMAL HIGH (ref 70–99)

## 2023-03-09 LAB — CBC
HCT: 24.7 % — ABNORMAL LOW (ref 36.0–46.0)
Hemoglobin: 7.6 g/dL — ABNORMAL LOW (ref 12.0–15.0)
MCH: 27 pg (ref 26.0–34.0)
MCHC: 30.8 g/dL (ref 30.0–36.0)
MCV: 87.6 fL (ref 80.0–100.0)
Platelets: 275 10*3/uL (ref 150–400)
RBC: 2.82 MIL/uL — ABNORMAL LOW (ref 3.87–5.11)
RDW: 13.7 % (ref 11.5–15.5)
WBC: 10.1 10*3/uL (ref 4.0–10.5)
nRBC: 0 % (ref 0.0–0.2)

## 2023-03-09 LAB — BASIC METABOLIC PANEL
Anion gap: 9 (ref 5–15)
BUN: 21 mg/dL (ref 8–23)
CO2: 23 mmol/L (ref 22–32)
Calcium: 9.1 mg/dL (ref 8.9–10.3)
Chloride: 101 mmol/L (ref 98–111)
Creatinine, Ser: 1.53 mg/dL — ABNORMAL HIGH (ref 0.44–1.00)
GFR, Estimated: 35 mL/min — ABNORMAL LOW (ref >60)
Glucose, Bld: 99 mg/dL (ref 70–99)
Potassium: 3.8 mmol/L (ref 3.5–5.1)
Sodium: 133 mmol/L — ABNORMAL LOW (ref 135–145)

## 2023-03-09 MED ORDER — SODIUM CHLORIDE 1 G PO TABS
1.0000 g | ORAL_TABLET | Freq: Three times a day (TID) | ORAL | Status: DC
  Administered 2023-03-09 – 2023-03-11 (×6): 1 g via ORAL
  Filled 2023-03-09 (×7): qty 1

## 2023-03-09 MED ORDER — DEXTROSE-SODIUM CHLORIDE 5-0.45 % IV SOLN: INTRAVENOUS | Status: AC

## 2023-03-09 NOTE — Progress Notes (Addendum)
   03/09/23 1056  Mobility  Activity Transferred to/from [facility]  Level of Assistance Maximum assist, patient does 25-49% (+2)  Assistive Device Front wheel walker;Other (Comment) (knee immobilizer)  LLE Weight Bearing Per Provider Order NWB  Activity Response Tolerated fair  Mobility Referral Yes  Mobility visit 1 Mobility  Mobility Specialist Start Time (ACUTE ONLY) 1033  Mobility Specialist Stop Time (ACUTE ONLY) 1050  Mobility Specialist Time Calculation (min) (ACUTE ONLY) 17 min   Mobility Specialist: Progress Note  NT requested assistance with pt - pt agreeable to mobility session - received in bed. Required MaxA+2 using RW. C/o L knee pain throughout shown through facial grimaces and verbal winces. Pt was distressed throughout session d/t pain and anticipation of pain. Pt required multiple verbal and tactile cues for WB precautions. Returned to chair with all needs met - call bell within reach. Left with RN and NT present.   Barnie Mort, BS Mobility Specialist Please contact via SecureChat or Rehab office at [phone].

## 2023-03-09 NOTE — Progress Notes (Signed)
Orthopaedic Trauma Progress Note  SUBJECTIVE: Doing okay this morning.  Continues to have some soreness to the left leg which we discussed is normal given her injury and surgery. Denies any numbness or tingling throughout the left lower extremity.  No chest pain. No SOB. No nausea/vomiting. No other complaints.  No family at bedside. Patient agreeable to SNF.  Tolerating diet and fluids. Morning labs show continued hyponatremia, continue salt tabs. Increase fluids.  OBJECTIVE:  Vitals:   03/08/23 1747 03/08/23 2025  BP: (!) 112/50 (!) 114/54  Pulse: 72 69  Resp: 18 18  Temp: 98.6 F (37 C) 98.5 F (36.9 C)  SpO2: 100% 99%    General: Sitting up in bed, no acute distress.  Pleasant and cooperative Respiratory: No increased work of breathing.  Left lower extremity: Mepilex dressings are clean, dry, intact.  Tenderness about the knee and proximal tibia as expected.  Knee range of motion limited secondary to pain.  Tolerates gentle ankle dorsiflexion/plantarflexion.  Able to wiggle the toes.  Dorsal sensation light touch over the foot. + DP pulse  IMAGING: Stable post op imaging.   LABS:  Results for orders placed or performed during the hospital encounter of 03/05/23 (from the past 24 hours)  Glucose, capillary     Status: Abnormal   Collection Time: 03/08/23 12:07 PM  Result Value Ref Range   Glucose-Capillary 115 (H) 70 - 99 mg/dL  Glucose, capillary     Status: Abnormal   Collection Time: 03/08/23  4:54 PM  Result Value Ref Range   Glucose-Capillary 136 (H) 70 - 99 mg/dL  Glucose, capillary     Status: Abnormal   Collection Time: 03/08/23  9:46 PM  Result Value Ref Range   Glucose-Capillary 101 (H) 70 - 99 mg/dL  CBC     Status: Abnormal   Collection Time: 03/09/23  5:54 AM  Result Value Ref Range   WBC 10.1 4.0 - 10.5 K/uL   RBC 2.82 (L) 3.87 - 5.11 MIL/uL   Hemoglobin 7.6 (L) 12.0 - 15.0 g/dL   HCT 52.8 (L) 41.3 - 24.4 %   MCV 87.6 80.0 - 100.0 fL   MCH 27.0 26.0 -  34.0 pg   MCHC 30.8 30.0 - 36.0 g/dL   RDW 01.0 27.2 - 53.6 %   Platelets 275 150 - 400 K/uL   nRBC 0.0 0.0 - 0.2 %  Basic metabolic panel     Status: Abnormal   Collection Time: 03/09/23  5:54 AM  Result Value Ref Range   Sodium 133 (L) 135 - 145 mmol/L   Potassium 3.8 3.5 - 5.1 mmol/L   Chloride 101 98 - 111 mmol/L   CO2 23 22 - 32 mmol/L   Glucose, Bld 99 70 - 99 mg/dL   BUN 21 8 - 23 mg/dL   Creatinine, Ser 6.44 (H) 0.44 - 1.00 mg/dL   Calcium 9.1 8.9 - 03.4 mg/dL   GFR, Estimated 35 (L) >60 mL/min   Anion gap 9 5 - 15  Glucose, capillary     Status: Abnormal   Collection Time: 03/09/23  6:22 AM  Result Value Ref Range   Glucose-Capillary 125 (H) 70 - 99 mg/dL    ASSESSMENT: Tonya Carpenter is a 77 y.o. female, 4 Days Post-Op s/p OPEN REDUCTION INTERNAL FIXATION LEFT TIBIAL PLATEAU  CV/Blood loss: Acute blood loss anemia, Hgb 7.6 this morning. Hemodynamically stable  PLAN: Weightbearing: NWB LLE ROM: Okay for unrestricted motion of the knee.  Wear knee immobilizer  at night Incisional and dressing care: change PRN Showering: Okay to begin showering getting incisions wet 03/08/2023 Orthopedic device(s): Knee immobilizer at night for first 2 weeks Pain management:  1. Tylenol 1000 mg q 6 hours scheduled 2. Robaxin 500 mg q 6 hours PRN 3. Oxycodone 5 mg q 4 hours PRN 4. Dilaudid 0.5-1 mg q 4 hours PRN VTE prophylaxis: Aspirin 81 mg, SCDs ID:  Ancef 2gm post op completed Foley/Lines:  No foley, KVO IVFs Impediments to Fracture Healing: Vitamin D level low normal at 32.5, continue home dose vitamin D3 supplementation Dispo: PT/OT evaluation ongoing, recommending SNF at discharge.  She is in agreement with this currently.  Continue to follow BMP/CBC  D/C recommendations: -Oxycodone, Tylenol, Robaxin for pain control -Aspirin 81 mg for DVT prophylaxis -Continue 1000 units Vit D supplementation daily x 90 days  Follow - up plan: 2 weeks after discharge for wound check and  repeat x-rays   Contact information:  Truitt Merle MD, Thyra Breed PA-C. After hours and holidays please check [email or web address] for group call information for [facility]   Thompson Caul, PA-C [phone] (office) [email or web address]

## 2023-03-09 NOTE — Plan of Care (Signed)

## 2023-03-10 LAB — HEMOGLOBIN AND HEMATOCRIT, BLOOD
HCT: 28.9 % — ABNORMAL LOW (ref 36.0–46.0)
Hemoglobin: 9.2 g/dL — ABNORMAL LOW (ref 12.0–15.0)

## 2023-03-10 LAB — PREPARE RBC (CROSSMATCH)

## 2023-03-10 LAB — BASIC METABOLIC PANEL
Anion gap: 6 (ref 5–15)
BUN: 14 mg/dL (ref 8–23)
CO2: 24 mmol/L (ref 22–32)
Calcium: 8.3 mg/dL — ABNORMAL LOW (ref 8.9–10.3)
Chloride: 103 mmol/L (ref 98–111)
Creatinine, Ser: 1.56 mg/dL — ABNORMAL HIGH (ref 0.44–1.00)
GFR, Estimated: 34 mL/min — ABNORMAL LOW (ref >60)
Glucose, Bld: 123 mg/dL — ABNORMAL HIGH (ref 70–99)
Potassium: 3.7 mmol/L (ref 3.5–5.1)
Sodium: 133 mmol/L — ABNORMAL LOW (ref 135–145)

## 2023-03-10 LAB — CBC
HCT: 22.3 % — ABNORMAL LOW (ref 36.0–46.0)
Hemoglobin: 7 g/dL — ABNORMAL LOW (ref 12.0–15.0)
MCH: 27.2 pg (ref 26.0–34.0)
MCHC: 31.4 g/dL (ref 30.0–36.0)
MCV: 86.8 fL (ref 80.0–100.0)
Platelets: 282 10*3/uL (ref 150–400)
RBC: 2.57 MIL/uL — ABNORMAL LOW (ref 3.87–5.11)
RDW: 13.7 % (ref 11.5–15.5)
WBC: 9.3 10*3/uL (ref 4.0–10.5)
nRBC: 0 % (ref 0.0–0.2)

## 2023-03-10 LAB — GLUCOSE, CAPILLARY
Glucose-Capillary: 113 mg/dL — ABNORMAL HIGH (ref 70–99)
Glucose-Capillary: 202 mg/dL — ABNORMAL HIGH (ref 70–99)
Glucose-Capillary: 76 mg/dL (ref 70–99)
Glucose-Capillary: 133 mg/dL — ABNORMAL HIGH (ref 70–99)

## 2023-03-10 LAB — ABO/RH: ABO/RH(D): A POS

## 2023-03-10 MED ORDER — ALPRAZOLAM 0.25 MG PO TABS: 0.2500 mg | ORAL_TABLET | Freq: Two times a day (BID) | ORAL | 0 refills | Status: DC | PRN

## 2023-03-10 MED ORDER — SODIUM CHLORIDE 0.9% IV SOLUTION: Freq: Once | INTRAVENOUS | Status: AC

## 2023-03-10 MED ORDER — SODIUM CHLORIDE 0.9 % IV SOLN: INTRAVENOUS | Status: DC

## 2023-03-10 MED ORDER — ALPRAZOLAM 0.25 MG PO TABS
0.2500 mg | ORAL_TABLET | Freq: Two times a day (BID) | ORAL | Status: DC | PRN
  Administered 2023-03-10: 0.25 mg via ORAL
  Filled 2023-03-10 (×2): qty 1

## 2023-03-10 MED ORDER — SODIUM CHLORIDE 1 G PO TABS: 1.0000 g | ORAL_TABLET | Freq: Three times a day (TID) | ORAL | 0 refills | Status: DC

## 2023-03-10 NOTE — Progress Notes (Addendum)
Orthopaedic Trauma Progress Note  SUBJECTIVE: Doing okay this morning.  Pain in the left leg stable.  Denies any numbness or tingling throughout the left lower extremity.  No chest pain. No SOB. No nausea/vomiting.  Denies any lightheadedness, dizziness at rest.  No other complaints.  Patient's cousin (April) at bedside this morning.  She states that patient has been extremely anxious at bedtime, asking for an as needed medication to assist with this.  She would also like to speak with the case manager today.  Was hoping patient could go to [facility] but there appears to be an issue with admission due to patient's mechanism of injury.  Would like to confirm this with [facility] team today.  Tolerating diet and fluids. Morning labs show continued hyponatremia.  Serum creatinine improving.  Continue salt tabs and IV fluids  OBJECTIVE:  Vitals:   03/10/23 0425 03/10/23 0739  BP: (!) 132/56 (!) 132/57  Pulse: 74 79  Resp: 19   Temp: 98.9 F (37.2 C) 98.1 F (36.7 C)  SpO2: 100% 98%    General: Sitting up in bed, no acute distress.  Pleasant and cooperative Respiratory: No increased work of breathing.  Left lower extremity: Mepilex dressings are clean, dry, intact.  Tenderness about the knee and proximal tibia as expected.  Knee range of motion limited secondary to pain.  Tolerates gentle ankle dorsiflexion/plantarflexion.  Able to wiggle the toes.  Dorsal sensation light touch over the foot. + DP pulse  IMAGING: Stable post op imaging.   LABS:  Results for orders placed or performed during the hospital encounter of 03/05/23 (from the past 24 hours)  Glucose, capillary     Status: Abnormal   Collection Time: 03/09/23  9:07 AM  Result Value Ref Range   Glucose-Capillary 180 (H) 70 - 99 mg/dL  Glucose, capillary     Status: Abnormal   Collection Time: 03/09/23 11:37 AM  Result Value Ref Range   Glucose-Capillary 131 (H) 70 - 99 mg/dL  Glucose, capillary     Status: Abnormal   Collection Time:  03/09/23  4:18 PM  Result Value Ref Range   Glucose-Capillary 116 (H) 70 - 99 mg/dL  Glucose, capillary     Status: Abnormal   Collection Time: 03/09/23  9:47 PM  Result Value Ref Range   Glucose-Capillary 190 (H) 70 - 99 mg/dL  CBC     Status: Abnormal   Collection Time: 03/10/23  5:33 AM  Result Value Ref Range   WBC 9.3 4.0 - 10.5 K/uL   RBC 2.57 (L) 3.87 - 5.11 MIL/uL   Hemoglobin 7.0 (L) 12.0 - 15.0 g/dL   HCT 16.1 (L) 09.6 - 04.5 %   MCV 86.8 80.0 - 100.0 fL   MCH 27.2 26.0 - 34.0 pg   MCHC 31.4 30.0 - 36.0 g/dL   RDW 40.9 81.1 - 91.4 %   Platelets 282 150 - 400 K/uL   nRBC 0.0 0.0 - 0.2 %  Basic metabolic panel     Status: Abnormal   Collection Time: 03/10/23  5:33 AM  Result Value Ref Range   Sodium 133 (L) 135 - 145 mmol/L   Potassium 3.7 3.5 - 5.1 mmol/L   Chloride 103 98 - 111 mmol/L   CO2 24 22 - 32 mmol/L   Glucose, Bld 123 (H) 70 - 99 mg/dL   BUN 14 8 - 23 mg/dL   Creatinine, Ser 7.82 (H) 0.44 - 1.00 mg/dL   Calcium 8.3 (L) 8.9 - 10.3 mg/dL  GFR, Estimated 34 (L) >60 mL/min   Anion gap 6 5 - 15  Glucose, capillary     Status: Abnormal   Collection Time: 03/10/23  6:31 AM  Result Value Ref Range   Glucose-Capillary 113 (H) 70 - 99 mg/dL    ASSESSMENT: Tonya Carpenter is a 77 y.o. female, 5 Days Post-Op s/p OPEN REDUCTION INTERNAL FIXATION LEFT TIBIAL PLATEAU  CV/Blood loss: Acute blood loss anemia, Hgb 7.0 this morning. Order 1 unit PRBCs this AM  PLAN: Weightbearing: NWB LLE ROM: Okay for unrestricted motion of the knee.  Wear knee immobilizer at night Incisional and dressing care: change PRN Showering: Okay to begin showering and getting incisions wet  Orthopedic device(s): Knee immobilizer at night for first 2 weeks Pain management: Continue current multimodal regimen. VTE prophylaxis: Aspirin 81 mg, SCDs ID:  Ancef 2gm post op completed Foley/Lines:  No foley, KVO IVFs Impediments to Fracture Healing: Vitamin D level low normal at 32.5, continue  home dose vitamin D3 supplementation Dispo: PT/OT evaluation ongoing, recommending SNF at discharge.  She is in agreement with this currently.  Transfuse 1 unit PRBCs today.  Recheck post transfusion H&H.  Expect patient will be ready for discharge in the next 24 to 48 hours  D/C recommendations: -Oxycodone, Tylenol, Robaxin for pain control -Aspirin 81 mg for DVT prophylaxis -Continue 1000 units Vit D supplementation daily x 90 days  Follow - up plan: 2 weeks after discharge for wound check and repeat x-rays   Contact information:  Truitt Merle MD, Thyra Breed PA-C. After hours and holidays please check [email or web address] for group call information for [facility]   Thompson Caul, PA-C [phone] (office) [email or web address]

## 2023-03-10 NOTE — Plan of Care (Signed)
  Problem: Education: Goal: Knowledge of General Education information will improve Description: Including pain rating scale, medication(s)/side effects and non-pharmacologic comfort measures Outcome: Progressing   Problem: Activity: Goal: Risk for activity intolerance will decrease Outcome: Progressing   Problem: Nutrition: Goal: Adequate nutrition will be maintained Outcome: Progressing   Problem: Coping: Goal: Level of anxiety will decrease Outcome: Progressing   

## 2023-03-10 NOTE — Plan of Care (Signed)
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
## 2023-03-10 NOTE — Care Management Important Message (Signed)
Important Message  Patient Details  Name: Tonya Carpenter MRN: [id number] Date of Birth: 03-27-1945   Important Message Given:  Yes - Medicare IM     Sherilyn Banker 03/10/2023, 3:14 PM

## 2023-03-10 NOTE — Progress Notes (Signed)
Physical Therapy Treatment Patient Details Name: Tonya Carpenter MRN: [id number] DOB: Jul 26, 1945 Today's Date: 03/10/2023   History of Present Illness Pt is a 77 y/o female who presents 03/05/2023 s/p fall at home. She sustained a L tibial plateau fx and is now s/p ORIF on 03/05/2023. PMH significant for Anxiety, chronic R shoulder pain, CAD, DOE, HTN, memory changes (2023), CVA, DM II.    PT Comments  Pt progressing towards her physical therapy goals, although remains anxious about pain (RN present during session to provide medication). Pt progressing to edge of bed without physical assist. Performed squat pivot to chair and additional sit to stand to walker with +2 assist. Decreased compliance to weightbearing precautions, so hopping deferred. Patient will benefit from continued inpatient follow up therapy, <3 hours/day.    If plan is discharge home, recommend the following: Two people to help with walking and/or transfers;Two people to help with bathing/dressing/bathroom;Assistance with cooking/housework;Direct supervision/assist for medications management;Direct supervision/assist for financial management;Assist for transportation;Help with stairs or ramp for entrance;Supervision due to cognitive status   Can travel by private vehicle     No  Equipment Recommendations  Wheelchair (measurements PT);Wheelchair cushion (measurements PT)    Recommendations for Other Services       Precautions / Restrictions Precautions Precautions: Fall Required Braces or Orthoses: Knee Immobilizer - Left (At night) Restrictions Weight Bearing Restrictions Per Provider Order: Yes LLE Weight Bearing Per Provider Order: Non weight bearing Other Position/Activity Restrictions: KI at night     Mobility  Bed Mobility Overal bed mobility: Needs Assistance Bed Mobility: Supine to Sit     Supine to sit: Contact guard     General bed mobility comments: Increased time, cues for initiation, no physical  assist required    Transfers Overall transfer level: Needs assistance Equipment used: Rolling walker (2 wheels), None Transfers: Sit to/from Stand, Bed to chair/wheelchair/BSC Sit to Stand: +2 physical assistance, Mod assist     Squat pivot transfers: +2 physical assistance, Mod assist     General transfer comment: Pt performed squat pivot transfer towards right with modA + 2, PT foot underneath LLE to enusre pt maintaining NWB status. Pt performed an additional sit to stand from recliner to RW, cues for hand and foot placement. Decreased compliance to WB precautions    Ambulation/Gait               General Gait Details: Unable to progress to gait training this session.   Stairs             Wheelchair Mobility     Tilt Bed    Modified Rankin (Stroke Patients Only)       Balance Overall balance assessment: Needs assistance Sitting-balance support: No upper extremity supported, Feet supported Sitting balance-Leahy Scale: Fair     Standing balance support: Bilateral upper extremity supported, During functional activity Standing balance-Leahy Scale: Poor Standing balance comment: RW                            Cognition Arousal: Alert Behavior During Therapy: Anxious Overall Cognitive Status: Impaired/Different from baseline Area of Impairment: Attention, Memory, Following commands, Safety/judgement, Awareness, Problem solving                   Current Attention Level: Sustained Memory: Decreased recall of precautions Following Commands: Follows one step commands with increased time, Follows one step commands inconsistently Safety/Judgement: Decreased awareness of safety Awareness: Intellectual Problem Solving: Slow  processing, Difficulty sequencing, Requires verbal cues, Decreased initiation          Exercises      General Comments        Pertinent Vitals/Pain Pain Assessment Pain Assessment: Faces Faces Pain Scale:  Hurts even more Pain Location: L LE Pain Descriptors / Indicators: Operative site guarding, Sore, Moaning, Grimacing Pain Intervention(s): Limited activity within patient's tolerance, Monitored during session, RN gave pain meds during session    Home Living                          Prior Function            PT Goals (current goals can now be found in the care plan section) Acute Rehab PT Goals Patient Stated Goal: Be able to return to her home PT Goal Formulation: With patient Time For Goal Achievement: 03/20/23 Potential to Achieve Goals: Good Progress towards PT goals: Progressing toward goals    Frequency    Min 1X/week      PT Plan      Co-evaluation              AM-PAC PT "6 Clicks" Mobility   Outcome Measure  Help needed turning from your back to your side while in a flat bed without using bedrails?: A Little Help needed moving from lying on your back to sitting on the side of a flat bed without using bedrails?: Total Help needed moving to and from a bed to a chair (including a wheelchair)?: Total Help needed standing up from a chair using your arms (e.g., wheelchair or bedside chair)?: Total Help needed to walk in hospital room?: Total Help needed climbing 3-5 steps with a railing? : Total 6 Click Score: 8    End of Session Equipment Utilized During Treatment: Gait belt Activity Tolerance: Patient limited by pain Patient left: in chair;with call bell/phone within reach;with chair alarm set Nurse Communication: Mobility status PT Visit Diagnosis: Unsteadiness on feet (R26.81);History of falling (Z91.81);Difficulty in walking, not elsewhere classified (R26.2);Pain Pain - Right/Left: Left Pain - part of body: Leg;Knee     Time: 1200-1223 PT Time Calculation (min) (ACUTE ONLY): 23 min  Charges:    $Therapeutic Activity: 23-37 mins PT General Charges $$ ACUTE PT VISIT: 1 Visit                     Lillia Pauls, PT, DPT Acute  Rehabilitation Services Office [phone]    Norval Morton 03/10/2023, 4:29 PM

## 2023-03-11 ENCOUNTER — Encounter (HOSPITAL_COMMUNITY): Payer: Self-pay | Admitting: Student

## 2023-03-11 DIAGNOSIS — S82122D Displaced fracture of lateral condyle of left tibia, subsequent encounter for closed fracture with routine healing: Secondary | ICD-10-CM | POA: Diagnosis not present

## 2023-03-11 DIAGNOSIS — R41841 Cognitive communication deficit: Secondary | ICD-10-CM | POA: Diagnosis not present

## 2023-03-11 DIAGNOSIS — F419 Anxiety disorder, unspecified: Secondary | ICD-10-CM | POA: Diagnosis not present

## 2023-03-11 DIAGNOSIS — R2689 Other abnormalities of gait and mobility: Secondary | ICD-10-CM | POA: Diagnosis not present

## 2023-03-11 DIAGNOSIS — M25511 Pain in right shoulder: Secondary | ICD-10-CM | POA: Diagnosis not present

## 2023-03-11 DIAGNOSIS — E1122 Type 2 diabetes mellitus with diabetic chronic kidney disease: Secondary | ICD-10-CM | POA: Diagnosis not present

## 2023-03-11 DIAGNOSIS — Z7401 Bed confinement status: Secondary | ICD-10-CM | POA: Diagnosis not present

## 2023-03-11 DIAGNOSIS — R531 Weakness: Secondary | ICD-10-CM | POA: Diagnosis not present

## 2023-03-11 DIAGNOSIS — G8929 Other chronic pain: Secondary | ICD-10-CM | POA: Diagnosis not present

## 2023-03-11 DIAGNOSIS — D631 Anemia in chronic kidney disease: Secondary | ICD-10-CM | POA: Diagnosis not present

## 2023-03-11 DIAGNOSIS — N183 Chronic kidney disease, stage 3 unspecified: Secondary | ICD-10-CM | POA: Diagnosis not present

## 2023-03-11 DIAGNOSIS — M6281 Muscle weakness (generalized): Secondary | ICD-10-CM | POA: Diagnosis not present

## 2023-03-11 DIAGNOSIS — F03B0 Unspecified dementia, moderate, without behavioral disturbance, psychotic disturbance, mood disturbance, and anxiety: Secondary | ICD-10-CM | POA: Diagnosis not present

## 2023-03-11 DIAGNOSIS — R1311 Dysphagia, oral phase: Secondary | ICD-10-CM | POA: Diagnosis not present

## 2023-03-11 LAB — CBC
HCT: 27.4 % — ABNORMAL LOW (ref 36.0–46.0)
Hemoglobin: 8.7 g/dL — ABNORMAL LOW (ref 12.0–15.0)
MCH: 27.8 pg (ref 26.0–34.0)
MCHC: 31.8 g/dL (ref 30.0–36.0)
MCV: 87.5 fL (ref 80.0–100.0)
Platelets: 300 10*3/uL (ref 150–400)
RBC: 3.13 MIL/uL — ABNORMAL LOW (ref 3.87–5.11)
RDW: 13.9 % (ref 11.5–15.5)
WBC: 7.9 10*3/uL (ref 4.0–10.5)
nRBC: 0 % (ref 0.0–0.2)

## 2023-03-11 LAB — GLUCOSE, CAPILLARY
Glucose-Capillary: 104 mg/dL — ABNORMAL HIGH (ref 70–99)
Glucose-Capillary: 131 mg/dL — ABNORMAL HIGH (ref 70–99)

## 2023-03-11 LAB — SAMPLE TO BLOOD BANK

## 2023-03-11 LAB — TYPE AND SCREEN
ABO/RH(D): A POS
Antibody Screen: NEGATIVE
Unit division: 0

## 2023-03-11 LAB — BASIC METABOLIC PANEL
Anion gap: 8 (ref 5–15)
BUN: 11 mg/dL (ref 8–23)
CO2: 25 mmol/L (ref 22–32)
Calcium: 8.5 mg/dL — ABNORMAL LOW (ref 8.9–10.3)
Chloride: 105 mmol/L (ref 98–111)
Creatinine, Ser: 1.01 mg/dL — ABNORMAL HIGH (ref 0.44–1.00)
GFR, Estimated: 58 mL/min — ABNORMAL LOW (ref >60)
Glucose, Bld: 103 mg/dL — ABNORMAL HIGH (ref 70–99)
Potassium: 4 mmol/L (ref 3.5–5.1)
Sodium: 138 mmol/L (ref 135–145)

## 2023-03-11 LAB — BPAM RBC
Blood Product Expiration Date: 202412282359
ISSUE DATE / TIME: 202412231655
Unit Type and Rh: 6200
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
## 2023-03-11 NOTE — Consult Note (Signed)
[facility] Liaison Consult Note    03/11/2023  GURTIE BIALY 11-Jan-1946 [id number]  Primary Care Provider:  [name], Elige Radon, MD with [facility]  Insurance: [facility]  Patient was reviewed for length of stay and a history with barriers to care  Patient was screened for hospitalization and on behalf of [facility]  Care Coordination to assess for post hospital community care needs.  Patient is being considered for a skilled nursing facility level of care for post hospital transition.  If the patient goes to a [facility] affiliated facility then, patient can be followed by [facility] RN with traditional Medicare and approved Medicare Advantage plans. [employer] Medicare is NOT one of the facility plans followed at this time.   Plan: Patient to have care needs met at a skilled nursing facility level of care for rehab.  For questions or referrals, please contact:  Charlesetta Shanks, RN, BSN, CCM [place]  [facility] Liaison Direct Dial: [phone] or secure chat Email: [email or web address]

## 2023-03-11 NOTE — TOC Transition Note (Signed)
Transition of Care [facility]) - Discharge Note   Patient Details  Name: Tonya Carpenter MRN: [id number] Date of Birth: Jun 12, 1945  Transition of Care [facility]) CM/SW Contact:  Erin Sons, LCSW Phone Number: 03/11/2023, 11:09 AM   Clinical Narrative:      Per MD patient ready for DC to [facility] RN, patient, patient's family, and facility notified of DC. Discharge Summary and FL2 sent to facility. RN to call report prior to discharge [phone]). DC packet on chart. Ambulance transport requested for patient.   CSW will sign off for now as social work intervention is no longer needed. Please consult [place] again if new needs arise.   Final next level of care: Skilled Nursing Facility Barriers to Discharge: No Barriers Identified   Patient Goals and CMS Choice   CMS Medicare.gov Compare Post Acute Care list provided to:: Patient Choice offered to / list presented to : Patient [place] ownership interest in [facility].provided to:: Patient    Discharge Placement              Patient chooses bed at: [facility] Patient to be transferred to facility by: [facility] Name of family member notified: Cousin April Patient and family notified of of transfer: 03/11/23  Discharge Plan and Services Additional resources added to the After Visit Summary for       Post Acute Care Choice: Skilled Nursing Facility                               Social Drivers of Health (SDOH) Interventions SDOH Screenings   Food Insecurity: Food Insecurity Present (03/05/2023)  Housing: Low Risk  (03/05/2023)  Transportation Needs: No Transportation Needs (03/05/2023)  Utilities: Not At Risk (03/05/2023)  Alcohol Screen: Low Risk  (06/28/2021)  Depression (PHQ2-9): Medium Risk (02/26/2023)  Financial Resource Strain: Low Risk  (07/05/2022)  Physical Activity: Insufficiently Active (07/05/2022)  Social Connections: Moderately Integrated (07/05/2022)  Stress: No Stress Concern Present  (07/05/2022)  Tobacco Use: Low Risk  (03/05/2023)     Readmission Risk Interventions     No data to display
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
## 2023-03-11 NOTE — Progress Notes (Signed)
Pt has DC order. Pt will be DC to [facility], report was given to [place], LPN all questions were answered. Cousin is aware of the transfer per case manager. Pt is tranferred via [facility].

## 2023-03-12 ENCOUNTER — Other Ambulatory Visit: Payer: Self-pay | Admitting: Family Medicine

## 2023-03-12 DIAGNOSIS — E1159 Type 2 diabetes mellitus with other circulatory complications: Secondary | ICD-10-CM

## 2023-03-17 DIAGNOSIS — F03B0 Unspecified dementia, moderate, without behavioral disturbance, psychotic disturbance, mood disturbance, and anxiety: Secondary | ICD-10-CM | POA: Diagnosis not present

## 2023-03-17 DIAGNOSIS — D631 Anemia in chronic kidney disease: Secondary | ICD-10-CM | POA: Diagnosis not present

## 2023-03-17 DIAGNOSIS — G8929 Other chronic pain: Secondary | ICD-10-CM | POA: Diagnosis not present

## 2023-03-17 DIAGNOSIS — F419 Anxiety disorder, unspecified: Secondary | ICD-10-CM | POA: Diagnosis not present

## 2023-03-17 DIAGNOSIS — M25511 Pain in right shoulder: Secondary | ICD-10-CM | POA: Diagnosis not present

## 2023-03-17 DIAGNOSIS — N183 Chronic kidney disease, stage 3 unspecified: Secondary | ICD-10-CM | POA: Diagnosis not present

## 2023-03-17 DIAGNOSIS — S82122D Displaced fracture of lateral condyle of left tibia, subsequent encounter for closed fracture with routine healing: Secondary | ICD-10-CM | POA: Diagnosis not present

## 2023-03-17 DIAGNOSIS — E1122 Type 2 diabetes mellitus with diabetic chronic kidney disease: Secondary | ICD-10-CM | POA: Diagnosis not present

## 2023-03-18 ENCOUNTER — Telehealth: Payer: Self-pay

## 2023-03-18 NOTE — Telephone Encounter (Signed)
 Copied from CRM [id number]. Topic: Appointments - Scheduling Inquiry for Clinic >> Mar 18, 2023  9:59 AM Jamee ORN wrote: Reason for CRM: Patient was recently discharged from a rehab facility, and needs to be scheduled for an appointment as soon as possible per the rehab facility. Please call her niece, April, to schedule patient an appointment at [phone].

## 2023-03-20 ENCOUNTER — Ambulatory Visit: Payer: Self-pay | Admitting: Family Medicine

## 2023-03-20 ENCOUNTER — Ambulatory Visit (INDEPENDENT_AMBULATORY_CARE_PROVIDER_SITE_OTHER): Payer: Medicare HMO | Admitting: Family Medicine

## 2023-03-20 ENCOUNTER — Ambulatory Visit (INDEPENDENT_AMBULATORY_CARE_PROVIDER_SITE_OTHER): Payer: Medicare HMO

## 2023-03-20 ENCOUNTER — Encounter: Payer: Self-pay | Admitting: Family Medicine

## 2023-03-20 VITALS — BP 128/82 | HR 85 | Temp 97.4°F | Ht 63.0 in

## 2023-03-20 DIAGNOSIS — M79601 Pain in right arm: Secondary | ICD-10-CM | POA: Diagnosis not present

## 2023-03-20 DIAGNOSIS — M19041 Primary osteoarthritis, right hand: Secondary | ICD-10-CM | POA: Diagnosis not present

## 2023-03-20 DIAGNOSIS — F03B0 Unspecified dementia, moderate, without behavioral disturbance, psychotic disturbance, mood disturbance, and anxiety: Secondary | ICD-10-CM | POA: Diagnosis not present
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
## 2023-03-20 NOTE — Telephone Encounter (Signed)
Scheduled today with morgan

## 2023-03-20 NOTE — Telephone Encounter (Signed)
 Copied from CRM [id number]. Topic: Clinical - Red Word Triage >> Mar 20, 2023 12:51 PM Nestora J wrote: Red Word that prompted transfer to Nurse Triage: Severe pain in arm   Chief Complaint: Arm pain Symptoms: Right arm pain Frequency: Constant with movement/touching Pertinent Negatives: Patient denies swelling or redness Disposition: [] ED /[] Urgent Care (no appt availability in office) / [x] Appointment(In office/virtual)/ []  [facility]/ [] Home Care/ [] Refused Recommended Disposition /[] [facility]/ []  Follow-up with PCP Additional Notes: Patient verified that I could talk to her niece. Patient's niece reports that the patient has been experiencing pain in her right arm for the last 2 days. She reports that it began at her wrist but is now from her wrist to her elbow. She reports that the patient's pain is worse with movement or when touching the arm, but that when it's at rest there is minimal to no pain. There is no known injury of the arm.  Advised of an available appointment today at 1:15 pm in a NP in the office. She is agreeable with this appointment.     Reason for Disposition  [1] Arm pains with exertion (e.g., walking) AND [2] pain goes away on resting AND [3] not present now  Answer Assessment - Initial Assessment Questions 1. ONSET: When did the pain start?     2 days ago  2. LOCATION: Where is the pain located?     Right elbow to wrist  3. PAIN: How bad is the pain? (Scale 1-10; or mild, moderate, severe)   - MILD (1-3): Doesn't interfere with normal activities.   - MODERATE (4-7): Interferes with normal activities (e.g., work or school) or awakens from sleep.   - SEVERE (8-10): Excruciating pain, unable to do any normal activities, unable to hold a cup of water .     Severe 4. WORK OR EXERCISE: Has there been any recent work or exercise that involved this part of the body?     No 5. CAUSE: What do you think is causing the arm pain?     Unsure   6. OTHER SYMPTOMS: Do you have any other symptoms? (e.g., neck pain, swelling, rash, fever, numbness, weakness)     No  Protocols used: Arm Pain-A-AH

## 2023-03-21 DIAGNOSIS — N183 Chronic kidney disease, stage 3 unspecified: Secondary | ICD-10-CM | POA: Diagnosis not present

## 2023-03-21 DIAGNOSIS — S82122D Displaced fracture of lateral condyle of left tibia, subsequent encounter for closed fracture with routine healing: Secondary | ICD-10-CM | POA: Diagnosis not present

## 2023-03-21 DIAGNOSIS — I447 Left bundle-branch block, unspecified: Secondary | ICD-10-CM | POA: Diagnosis not present

## 2023-03-21 DIAGNOSIS — F03B4 Unspecified dementia, moderate, with anxiety: Secondary | ICD-10-CM | POA: Diagnosis not present

## 2023-03-21 DIAGNOSIS — G8929 Other chronic pain: Secondary | ICD-10-CM | POA: Diagnosis not present

## 2023-03-21 DIAGNOSIS — M19011 Primary osteoarthritis, right shoulder: Secondary | ICD-10-CM | POA: Diagnosis not present

## 2023-03-21 DIAGNOSIS — I2510 Atherosclerotic heart disease of native coronary artery without angina pectoris: Secondary | ICD-10-CM | POA: Diagnosis not present

## 2023-03-21 DIAGNOSIS — E1122 Type 2 diabetes mellitus with diabetic chronic kidney disease: Secondary | ICD-10-CM | POA: Diagnosis not present

## 2023-03-21 DIAGNOSIS — I129 Hypertensive chronic kidney disease with stage 1 through stage 4 chronic kidney disease, or unspecified chronic kidney disease: Secondary | ICD-10-CM | POA: Diagnosis not present

## 2023-03-24 DIAGNOSIS — M19011 Primary osteoarthritis, right shoulder: Secondary | ICD-10-CM | POA: Diagnosis not present

## 2023-03-24 DIAGNOSIS — N183 Chronic kidney disease, stage 3 unspecified: Secondary | ICD-10-CM | POA: Diagnosis not present

## 2023-03-24 DIAGNOSIS — E1122 Type 2 diabetes mellitus with diabetic chronic kidney disease: Secondary | ICD-10-CM | POA: Diagnosis not present

## 2023-03-24 DIAGNOSIS — I447 Left bundle-branch block, unspecified: Secondary | ICD-10-CM | POA: Diagnosis not present

## 2023-03-24 DIAGNOSIS — G8929 Other chronic pain: Secondary | ICD-10-CM | POA: Diagnosis not present

## 2023-03-24 DIAGNOSIS — I129 Hypertensive chronic kidney disease with stage 1 through stage 4 chronic kidney disease, or unspecified chronic kidney disease: Secondary | ICD-10-CM | POA: Diagnosis not present

## 2023-03-24 DIAGNOSIS — S82122D Displaced fracture of lateral condyle of left tibia, subsequent encounter for closed fracture with routine healing: Secondary | ICD-10-CM | POA: Diagnosis not present

## 2023-03-24 DIAGNOSIS — I2510 Atherosclerotic heart disease of native coronary artery without angina pectoris: Secondary | ICD-10-CM | POA: Diagnosis not present

## 2023-03-24 DIAGNOSIS — F03B4 Unspecified dementia, moderate, with anxiety: Secondary | ICD-10-CM | POA: Diagnosis not present

## 2023-03-25 DIAGNOSIS — S82102D Unspecified fracture of upper end of left tibia, subsequent encounter for closed fracture with routine healing: Secondary | ICD-10-CM | POA: Diagnosis not present

## 2023-03-26 DIAGNOSIS — I447 Left bundle-branch block, unspecified: Secondary | ICD-10-CM | POA: Diagnosis not present

## 2023-03-26 DIAGNOSIS — N183 Chronic kidney disease, stage 3 unspecified: Secondary | ICD-10-CM | POA: Diagnosis not present

## 2023-03-26 DIAGNOSIS — M19011 Primary osteoarthritis, right shoulder: Secondary | ICD-10-CM | POA: Diagnosis not present

## 2023-03-26 DIAGNOSIS — I129 Hypertensive chronic kidney disease with stage 1 through stage 4 chronic kidney disease, or unspecified chronic kidney disease: Secondary | ICD-10-CM | POA: Diagnosis not present

## 2023-03-26 DIAGNOSIS — I2510 Atherosclerotic heart disease of native coronary artery without angina pectoris: Secondary | ICD-10-CM | POA: Diagnosis not present

## 2023-03-26 DIAGNOSIS — E1122 Type 2 diabetes mellitus with diabetic chronic kidney disease: Secondary | ICD-10-CM | POA: Diagnosis not present

## 2023-03-26 DIAGNOSIS — G8929 Other chronic pain: Secondary | ICD-10-CM | POA: Diagnosis not present

## 2023-03-26 DIAGNOSIS — F03B4 Unspecified dementia, moderate, with anxiety: Secondary | ICD-10-CM | POA: Diagnosis not present

## 2023-03-26 DIAGNOSIS — S82122D Displaced fracture of lateral condyle of left tibia, subsequent encounter for closed fracture with routine healing: Secondary | ICD-10-CM | POA: Diagnosis not present

## 2023-03-28 ENCOUNTER — Ambulatory Visit (INDEPENDENT_AMBULATORY_CARE_PROVIDER_SITE_OTHER): Payer: Medicare HMO

## 2023-03-28 ENCOUNTER — Inpatient Hospital Stay: Payer: Medicare HMO | Admitting: Family Medicine

## 2023-03-28 DIAGNOSIS — F419 Anxiety disorder, unspecified: Secondary | ICD-10-CM

## 2023-03-28 DIAGNOSIS — M19011 Primary osteoarthritis, right shoulder: Secondary | ICD-10-CM

## 2023-03-28 DIAGNOSIS — F03B4 Unspecified dementia, moderate, with anxiety: Secondary | ICD-10-CM | POA: Diagnosis not present

## 2023-03-28 DIAGNOSIS — G8929 Other chronic pain: Secondary | ICD-10-CM | POA: Diagnosis not present

## 2023-03-28 DIAGNOSIS — E1122 Type 2 diabetes mellitus with diabetic chronic kidney disease: Secondary | ICD-10-CM

## 2023-03-28 DIAGNOSIS — I129 Hypertensive chronic kidney disease with stage 1 through stage 4 chronic kidney disease, or unspecified chronic kidney disease: Secondary | ICD-10-CM

## 2023-03-28 DIAGNOSIS — S82122D Displaced fracture of lateral condyle of left tibia, subsequent encounter for closed fracture with routine healing: Secondary | ICD-10-CM

## 2023-03-28 DIAGNOSIS — I447 Left bundle-branch block, unspecified: Secondary | ICD-10-CM | POA: Diagnosis not present

## 2023-03-28 DIAGNOSIS — N183 Chronic kidney disease, stage 3 unspecified: Secondary | ICD-10-CM

## 2023-03-28 DIAGNOSIS — I2510 Atherosclerotic heart disease of native coronary artery without angina pectoris: Secondary | ICD-10-CM | POA: Diagnosis not present

## 2023-03-28 DIAGNOSIS — E785 Hyperlipidemia, unspecified: Secondary | ICD-10-CM

## 2023-03-28 DIAGNOSIS — J45909 Unspecified asthma, uncomplicated: Secondary | ICD-10-CM

## 2023-04-02 DIAGNOSIS — M19011 Primary osteoarthritis, right shoulder: Secondary | ICD-10-CM | POA: Diagnosis not present

## 2023-04-02 DIAGNOSIS — I2510 Atherosclerotic heart disease of native coronary artery without angina pectoris: Secondary | ICD-10-CM | POA: Diagnosis not present

## 2023-04-02 DIAGNOSIS — S82122D Displaced fracture of lateral condyle of left tibia, subsequent encounter for closed fracture with routine healing: Secondary | ICD-10-CM | POA: Diagnosis not present

## 2023-04-02 DIAGNOSIS — E1122 Type 2 diabetes mellitus with diabetic chronic kidney disease: Secondary | ICD-10-CM | POA: Diagnosis not present

## 2023-04-02 DIAGNOSIS — I447 Left bundle-branch block, unspecified: Secondary | ICD-10-CM | POA: Diagnosis not present

## 2023-04-02 DIAGNOSIS — N183 Chronic kidney disease, stage 3 unspecified: Secondary | ICD-10-CM | POA: Diagnosis not present

## 2023-04-02 DIAGNOSIS — G8929 Other chronic pain: Secondary | ICD-10-CM | POA: Diagnosis not present

## 2023-04-02 DIAGNOSIS — I129 Hypertensive chronic kidney disease with stage 1 through stage 4 chronic kidney disease, or unspecified chronic kidney disease: Secondary | ICD-10-CM | POA: Diagnosis not present

## 2023-04-02 DIAGNOSIS — F03B4 Unspecified dementia, moderate, with anxiety: Secondary | ICD-10-CM | POA: Diagnosis not present

## 2023-04-04 ENCOUNTER — Telehealth: Payer: Self-pay | Admitting: Family Medicine

## 2023-04-04 NOTE — Telephone Encounter (Signed)
Copied from CRM [id number]. Topic: General - Other >> Apr 04, 2023 10:54 AM Gaetano Hawthorne wrote: Reason for CRM: Patient would like to speak with a nurse to follow up on a previous request - Patient declined to provide any additional details and asked if she could have a nurse give her a call.

## 2023-04-04 NOTE — Telephone Encounter (Signed)
Pt wanted to confirm appt on 1/24 with Jerrel Ivory for a rehab follow up. Time is at 3:20. Pt made aware and had no further concerns.

## 2023-04-08 DIAGNOSIS — S83262D Peripheral tear of lateral meniscus, current injury, left knee, subsequent encounter: Secondary | ICD-10-CM | POA: Diagnosis not present

## 2023-04-08 DIAGNOSIS — S82122D Displaced fracture of lateral condyle of left tibia, subsequent encounter for closed fracture with routine healing: Secondary | ICD-10-CM | POA: Diagnosis not present

## 2023-04-09 DIAGNOSIS — I447 Left bundle-branch block, unspecified: Secondary | ICD-10-CM | POA: Diagnosis not present

## 2023-04-09 DIAGNOSIS — S82122D Displaced fracture of lateral condyle of left tibia, subsequent encounter for closed fracture with routine healing: Secondary | ICD-10-CM | POA: Diagnosis not present

## 2023-04-09 DIAGNOSIS — E1122 Type 2 diabetes mellitus with diabetic chronic kidney disease: Secondary | ICD-10-CM | POA: Diagnosis not present

## 2023-04-09 DIAGNOSIS — G8929 Other chronic pain: Secondary | ICD-10-CM | POA: Diagnosis not present

## 2023-04-09 DIAGNOSIS — I2510 Atherosclerotic heart disease of native coronary artery without angina pectoris: Secondary | ICD-10-CM | POA: Diagnosis not present

## 2023-04-09 DIAGNOSIS — F03B4 Unspecified dementia, moderate, with anxiety: Secondary | ICD-10-CM | POA: Diagnosis not present

## 2023-04-09 DIAGNOSIS — M19011 Primary osteoarthritis, right shoulder: Secondary | ICD-10-CM | POA: Diagnosis not present

## 2023-04-09 DIAGNOSIS — I129 Hypertensive chronic kidney disease with stage 1 through stage 4 chronic kidney disease, or unspecified chronic kidney disease: Secondary | ICD-10-CM | POA: Diagnosis not present

## 2023-04-09 DIAGNOSIS — N183 Chronic kidney disease, stage 3 unspecified: Secondary | ICD-10-CM | POA: Diagnosis not present

## 2023-04-10 ENCOUNTER — Telehealth: Payer: Self-pay

## 2023-04-10 ENCOUNTER — Inpatient Hospital Stay: Payer: Medicare HMO | Admitting: Family Medicine

## 2023-04-10 DIAGNOSIS — I2510 Atherosclerotic heart disease of native coronary artery without angina pectoris: Secondary | ICD-10-CM | POA: Diagnosis not present

## 2023-04-10 DIAGNOSIS — S82122D Displaced fracture of lateral condyle of left tibia, subsequent encounter for closed fracture with routine healing: Secondary | ICD-10-CM | POA: Diagnosis not present

## 2023-04-10 DIAGNOSIS — G8929 Other chronic pain: Secondary | ICD-10-CM | POA: Diagnosis not present

## 2023-04-10 DIAGNOSIS — F03B4 Unspecified dementia, moderate, with anxiety: Secondary | ICD-10-CM | POA: Diagnosis not present

## 2023-04-10 DIAGNOSIS — N183 Chronic kidney disease, stage 3 unspecified: Secondary | ICD-10-CM | POA: Diagnosis not present

## 2023-04-10 DIAGNOSIS — I129 Hypertensive chronic kidney disease with stage 1 through stage 4 chronic kidney disease, or unspecified chronic kidney disease: Secondary | ICD-10-CM | POA: Diagnosis not present

## 2023-04-10 DIAGNOSIS — I447 Left bundle-branch block, unspecified: Secondary | ICD-10-CM | POA: Diagnosis not present

## 2023-04-10 DIAGNOSIS — M19011 Primary osteoarthritis, right shoulder: Secondary | ICD-10-CM | POA: Diagnosis not present

## 2023-04-10 DIAGNOSIS — E1122 Type 2 diabetes mellitus with diabetic chronic kidney disease: Secondary | ICD-10-CM | POA: Diagnosis not present

## 2023-04-10 NOTE — Telephone Encounter (Signed)
Okay sounds good 

## 2023-04-10 NOTE — Telephone Encounter (Signed)
Copied from CRM [id number]. Topic: Clinical - Home Health Verbal Orders >> Apr 10, 2023 11:04 AM Geroge Baseman wrote: Caller/Agency: Deirdre/ Randolm Idol Number: [phone] Service Requested: Stated that patient would like to focus on physical therapy, evaluation only   Frequency: none Any new concerns about the patient? No

## 2023-04-10 NOTE — Telephone Encounter (Signed)
Tonya Carpenter just wanted to let [name] know that pt will no longer be received speech therapy.

## 2023-04-11 ENCOUNTER — Ambulatory Visit (INDEPENDENT_AMBULATORY_CARE_PROVIDER_SITE_OTHER): Payer: Medicare HMO | Admitting: Family Medicine

## 2023-04-11 ENCOUNTER — Encounter: Payer: Self-pay | Admitting: Family Medicine

## 2023-04-11 VITALS — BP 148/75 | Temp 98.2°F | Ht 63.0 in | Wt 147.0 lb

## 2023-04-11 DIAGNOSIS — Z7409 Other reduced mobility: Secondary | ICD-10-CM | POA: Diagnosis not present

## 2023-04-11 DIAGNOSIS — F03B0 Unspecified dementia, moderate, without behavioral disturbance, psychotic disturbance, mood disturbance, and anxiety: Secondary | ICD-10-CM | POA: Diagnosis not present

## 2023-04-11 DIAGNOSIS — R030 Elevated blood-pressure reading, without diagnosis of hypertension: Secondary | ICD-10-CM

## 2023-04-11 DIAGNOSIS — D649 Anemia, unspecified: Secondary | ICD-10-CM | POA: Diagnosis not present

## 2023-04-11 DIAGNOSIS — Z789 Other specified health status: Secondary | ICD-10-CM

## 2023-04-11 DIAGNOSIS — S82132D Displaced fracture of medial condyle of left tibia, subsequent encounter for closed fracture with routine healing: Secondary | ICD-10-CM

## 2023-04-11 DIAGNOSIS — R413 Other amnesia: Secondary | ICD-10-CM | POA: Diagnosis not present

## 2023-04-11 NOTE — Progress Notes (Signed)
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 medications.  Follow up plan: Return if symptoms worsen or fail to improve.  Continue healthy lifestyle choices, including diet (rich in fruits, vegetables, and lean proteins, and low in salt and simple carbohydrates) and exercise (at least 30 minutes of moderate physical activity daily).  Written and verbal instructions provided   The above assessment and management plan was discussed with the patient. The patient verbalized understanding of and has agreed to the management plan. Patient is aware to call the clinic if they develop any new symptoms or if symptoms persist or worsen. Patient is aware when to return to the clinic for a follow-up visit. Patient educated on when it is appropriate to go to the emergency department.   Neale Burly,  DNP-FNP [facility] [address] [place], [place] [address] [phone]

## 2023-04-12 LAB — CBC WITH DIFFERENTIAL/PLATELET
Basophils Absolute: 0 10*3/uL (ref 0.0–0.2)
Basos: 0 %
EOS (ABSOLUTE): 0.1 10*3/uL (ref 0.0–0.4)
Eos: 1 %
Hematocrit: 35.5 % (ref 34.0–46.6)
Hemoglobin: 10.9 g/dL — ABNORMAL LOW (ref 11.1–15.9)
Immature Grans (Abs): 0 10*3/uL (ref 0.0–0.1)
Immature Granulocytes: 0 %
Lymphocytes Absolute: 2.2 10*3/uL (ref 0.7–3.1)
Lymphs: 23 %
MCH: 27.2 pg (ref 26.6–33.0)
MCHC: 30.7 g/dL — ABNORMAL LOW (ref 31.5–35.7)
MCV: 89 fL (ref 79–97)
Monocytes Absolute: 0.6 10*3/uL (ref 0.1–0.9)
Monocytes: 7 %
Neutrophils Absolute: 6.4 10*3/uL (ref 1.4–7.0)
Neutrophils: 69 %
Platelets: 252 10*3/uL (ref 150–450)
RBC: 4.01 x10E6/uL (ref 3.77–5.28)
RDW: 13.4 % (ref 11.7–15.4)
WBC: 9.4 10*3/uL (ref 3.4–10.8)

## 2023-04-12 LAB — CMP14+EGFR
ALT: 10 IU/L (ref 0–32)
AST: 13 IU/L (ref 0–40)
Albumin: 4.1 g/dL (ref 3.8–4.8)
Alkaline Phosphatase: 73 IU/L (ref 44–121)
BUN/Creatinine Ratio: 17 (ref 12–28)
BUN: 20 mg/dL (ref 8–27)
CO2: 28 mmol/L (ref 20–29)
Calcium: 10.2 mg/dL (ref 8.7–10.3)
Chloride: 98 mmol/L (ref 96–106)
Creatinine, Ser: 1.19 mg/dL — ABNORMAL HIGH (ref 0.57–1.00)
Globulin, Total: 3.6 g/dL (ref 1.5–4.5)
Glucose: 248 mg/dL — ABNORMAL HIGH (ref 70–99)
Potassium: 4.3 mmol/L (ref 3.5–5.2)
Sodium: 139 mmol/L (ref 134–144)
Total Protein: 7.7 g/dL (ref 6.0–8.5)
eGFR: 47 mL/min/{1.73_m2} — ABNORMAL LOW (ref >59)

## 2023-04-14 ENCOUNTER — Telehealth: Payer: Self-pay | Admitting: Family Medicine

## 2023-04-14 NOTE — Telephone Encounter (Signed)
FYI if seen.   Copied from CRM [id number]. Topic: Clinical - Request for Lab/Test Order >> Apr 14, 2023 12:59 PM Desma Mcgregor wrote: Reason for CRM: [employer] calling to see if the office received the paperwork sent 04/01/23 for Osteoporosis Management for the patient to get a bone density test completed. Will resend the paperwork today.

## 2023-04-14 NOTE — Telephone Encounter (Signed)
[name],  Have you seen any paperwork on your desk about pt scheduling DEXA.  We can just get one scheduled?

## 2023-04-15 ENCOUNTER — Encounter: Payer: Self-pay | Admitting: Family Medicine

## 2023-04-15 NOTE — Progress Notes (Signed)
Hgb improving. Elevated glucose. Recommend patient continue to monitor at home and report measurements to PCP. GFR stable to previous. Recommend patient follow up with PCP for continued management of T2DM and CKD.

## 2023-04-16 DIAGNOSIS — G8929 Other chronic pain: Secondary | ICD-10-CM | POA: Diagnosis not present

## 2023-04-16 DIAGNOSIS — I129 Hypertensive chronic kidney disease with stage 1 through stage 4 chronic kidney disease, or unspecified chronic kidney disease: Secondary | ICD-10-CM | POA: Diagnosis not present

## 2023-04-16 DIAGNOSIS — I447 Left bundle-branch block, unspecified: Secondary | ICD-10-CM | POA: Diagnosis not present

## 2023-04-16 DIAGNOSIS — M19011 Primary osteoarthritis, right shoulder: Secondary | ICD-10-CM | POA: Diagnosis not present

## 2023-04-16 DIAGNOSIS — N183 Chronic kidney disease, stage 3 unspecified: Secondary | ICD-10-CM | POA: Diagnosis not present

## 2023-04-16 DIAGNOSIS — S82122D Displaced fracture of lateral condyle of left tibia, subsequent encounter for closed fracture with routine healing: Secondary | ICD-10-CM | POA: Diagnosis not present

## 2023-04-16 DIAGNOSIS — I2510 Atherosclerotic heart disease of native coronary artery without angina pectoris: Secondary | ICD-10-CM | POA: Diagnosis not present

## 2023-04-16 DIAGNOSIS — F03B4 Unspecified dementia, moderate, with anxiety: Secondary | ICD-10-CM | POA: Diagnosis not present

## 2023-04-16 DIAGNOSIS — E1122 Type 2 diabetes mellitus with diabetic chronic kidney disease: Secondary | ICD-10-CM | POA: Diagnosis not present

## 2023-04-16 NOTE — Telephone Encounter (Signed)
Spoke with patients niece, April. She understands the need for DEXA. However, patient is still healing from knee injury and having PT. She would like to hold off on the scan until pt has improved. Niece will discuss at appt in March.

## 2023-04-16 NOTE — Telephone Encounter (Unsigned)
Copied from CRM [id number]. Topic: Clinical - Medication Question >> Apr 16, 2023  4:20 PM Desma Mcgregor wrote: Reason for CRM: Patient bought some OTC meds that say arthritis pills for pain and joints. Needs to know if its safe to take. Please contact patient back asap.

## 2023-04-16 NOTE — Telephone Encounter (Signed)
Yeah that is basically what the paperwork says is we just need to get one scheduled for her because of her recent fracture.  I do not know when she will be able to actually come in in person but we can try and schedule it, that is why I had not scheduled that before is because she could could not come in in person

## 2023-04-18 ENCOUNTER — Telehealth: Payer: Self-pay | Admitting: Family Medicine

## 2023-04-18 NOTE — Telephone Encounter (Unsigned)
Copied from CRM [id number]. Topic: Clinical - Home Health Verbal Orders >> Apr 18, 2023  2:31 PM Ivette P wrote: Caller/Agency: DON Tilda Burrow Occupational Therapist Callback Number: [phone]  Message:   OK for OT to evaluate next week.

## 2023-04-18 NOTE — Telephone Encounter (Signed)
 Verbal given

## 2023-04-22 ENCOUNTER — Ambulatory Visit: Payer: Self-pay | Admitting: Family Medicine
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
## 2023-04-24 ENCOUNTER — Ambulatory Visit (INDEPENDENT_AMBULATORY_CARE_PROVIDER_SITE_OTHER): Payer: Medicare HMO

## 2023-04-24 ENCOUNTER — Ambulatory Visit: Payer: Medicare HMO | Admitting: Family Medicine

## 2023-04-24 ENCOUNTER — Encounter: Payer: Self-pay | Admitting: Family Medicine

## 2023-04-24 VITALS — BP 134/67 | HR 70 | Ht 63.0 in | Wt 147.0 lb

## 2023-04-24 DIAGNOSIS — S82142D Displaced bicondylar fracture of left tibia, subsequent encounter for closed fracture with routine healing: Secondary | ICD-10-CM | POA: Diagnosis not present

## 2023-04-24 DIAGNOSIS — M25562 Pain in left knee: Secondary | ICD-10-CM

## 2023-04-24 DIAGNOSIS — M1712 Unilateral primary osteoarthritis, left knee: Secondary | ICD-10-CM | POA: Diagnosis not present

## 2023-04-24 MED ORDER — IBUPROFEN 600 MG PO TABS: 600.0000 mg | ORAL_TABLET | Freq: Three times a day (TID) | ORAL | 0 refills | Status: AC | PRN

## 2023-04-24 NOTE — Progress Notes (Signed)
 BP 134/67   Pulse 70   Ht 5' 3 (1.6 m)   Wt 147 lb (66.7 kg)   SpO2 98%   BMI 26.04 kg/m    Subjective:   Patient ID: Tonya Carpenter, female    DOB: 06-07-1945, 78 y.o.   MRN: [id number]  HPI: Tonya Carpenter is a 78 y.o. female presenting on 04/24/2023 for Fall Amon getting up from bed and walking to bedside commode)   HPI Knee pain Patient is brought in today by her caretaker.  She had been doing physical therapy and getting around better up until about a week ago and she has been having some falls and since last week her left knee which she had surgery on has been significantly bothering her to where she is having a lot more pain with walking and more unstable and just not doing as well.  We did ask her caretaker and they asked family members if they witnessed her fall on her knees and they have not but she has had multiple falls, the most recent was just over the weekend she fell and slid onto her bottom but she is not having any back pain or complaining of any hip pain.  Primarily she complains of pain in that left knee.  She continues to have physical therapy working with her at home.  Relevant past medical, surgical, family and social history reviewed and updated as indicated. Interim medical history since our last visit reviewed. Allergies and medications reviewed and updated.  Review of Systems  Constitutional:  Negative for chills and fever.  Eyes:  Negative for visual disturbance.  Respiratory:  Negative for chest tightness and shortness of breath.   Cardiovascular:  Negative for chest pain and leg swelling.  Musculoskeletal:  Positive for arthralgias, gait problem and myalgias. Negative for back pain and neck pain.  Skin:  Negative for rash.  Neurological:  Negative for light-headedness and headaches.  Psychiatric/Behavioral:  Negative for agitation and behavioral problems.   All other systems reviewed and are negative.   Per HPI unless specifically indicated  above   Allergies as of 04/24/2023       Reactions   Trilyte  [peg 3350 -kcl-na Bicarb-nacl] Nausea And Vomiting        Medication List        Accurate as of April 24, 2023  2:32 PM. If you have any questions, ask your nurse or doctor.          Accu-Chek Guide w/Device Kit USE AS DIRECTED FOR DIABETES   acetaminophen  500 MG tablet Commonly known as: TYLENOL  Take 1,000 mg by mouth every 6 (six) hours as needed for mild pain or headache.   ALPRAZolam  0.25 MG tablet Commonly known as: XANAX  Take 1 tablet (0.25 mg total) by mouth 2 (two) times daily as needed for anxiety.   aspirin  EC 81 MG tablet Commonly known as: Aspirin  Low Dose TAKE 1 TABLET EVERY DAY - SWALLOW WHOLE   atorvastatin  20 MG tablet Commonly known as: LIPITOR Take 1 tablet (20 mg total) by mouth daily.   cholecalciferol  25 MCG (1000 UNIT) tablet Commonly known as: VITAMIN D3 Take 1 tablet (1,000 Units total) by mouth daily.   diclofenac  Sodium 1 % Gel Commonly known as: VOLTAREN  Apply 2 g topically 4 (four) times daily as needed.   DropSafe Alcohol  Prep 70 % Pads TEST BS BID and as needed Dx E11.9   ibuprofen  600 MG tablet Commonly known as: ADVIL  Take 1 tablet (600 mg  total) by mouth every 8 (eight) hours as needed for up to 7 days. Started by: Fonda LABOR Oriya Kettering   Janumet  XR 50-500 MG Tb24 Generic drug: SitaGLIPtin -MetFORMIN  HCl Take 1 tablet by mouth 2 (two) times daily.   losartan -hydrochlorothiazide  50-12.5 MG tablet Commonly known as: HYZAAR TAKE 1 TABLET EVERY DAY   methocarbamol  500 MG tablet Commonly known as: ROBAXIN  Take 1 tablet (500 mg total) by mouth every 6 (six) hours as needed for muscle spasms.   metoprolol  succinate 50 MG 24 hr tablet Commonly known as: TOPROL -XL Take 1 tablet (50 mg total) by mouth daily. Take with or immediately following a meal.   nitroGLYCERIN  0.4 MG SL tablet Commonly known as: NITROSTAT  DISSOLVE 1 TABLET UNDER THE TONGUE EVERY 5 MINUTES AS  NEEDED FOR CHEST PAIN   omeprazole  40 MG capsule Commonly known as: PRILOSEC Take 1 capsule (40 mg total) by mouth daily.   oxyCODONE  5 MG immediate release tablet Commonly known as: Oxy IR/ROXICODONE  Take 1 tablet (5 mg total) by mouth every 4 (four) hours as needed for moderate pain (pain score 4-6).   Prodigy Twist Top Lancets 28G Misc TEST BS BID and as needed Dx E11.9   REFRESH RELIEVA OP Apply 1 drop to eye in the morning and at bedtime.   True Metrix Blood Glucose Test test strip Generic drug: glucose blood TEST BS BID and as needed Dx E11.9   True Metrix Level 1 Low Soln Use as needed with BS machine   TRUEdraw Lancing Device Misc Teset BS BID Dx E11.9         Objective:   BP 134/67   Pulse 70   Ht 5' 3 (1.6 m)   Wt 147 lb (66.7 kg)   SpO2 98%   BMI 26.04 kg/m   Wt Readings from Last 3 Encounters:  04/24/23 147 lb (66.7 kg)  04/11/23 147 lb (66.7 kg)  03/05/23 156 lb (70.8 kg)    Physical Exam Vitals and nursing note reviewed.  Constitutional:      General: She is not in acute distress.    Appearance: She is well-developed. She is not diaphoretic.  Eyes:     Conjunctiva/sclera: Conjunctivae normal.  Musculoskeletal:        General: Tenderness present. Normal range of motion.     Left knee: Swelling and effusion present. Tenderness present over the lateral joint line. No medial joint line tenderness.  Skin:    General: Skin is warm and dry.     Findings: No rash.  Neurological:     Mental Status: She is alert and oriented to person, place, and time.     Coordination: Coordination normal.  Psychiatric:        Behavior: Behavior normal.     Left knee pain: Hardware looks to be in place, no acute bony abnormalities, she does have some lateral compartmental narrowing.  Await final read from radiology  Assessment & Plan:   Problem List Items Addressed This Visit   None Visit Diagnoses       Acute pain of left knee    -  Primary   Relevant  Medications   ibuprofen  (ADVIL ) 600 MG tablet   Other Relevant Orders   DG Knee 1-2 Views Left       Treat conservatively, will await read from radiology on x-ray but does not appear that hardware is displaced or has any fracture. Follow up plan: Return if symptoms worsen or fail to improve.  Counseling provided for all  of the vaccine components Orders Placed This Encounter  Procedures   DG Knee 1-2 Views Left    Fonda Levins, MD [facility] 04/24/2023, 2:32 PM

## 2023-04-28 ENCOUNTER — Encounter: Payer: Self-pay | Admitting: Family Medicine

## 2023-04-29 ENCOUNTER — Telehealth: Payer: Self-pay | Admitting: Family Medicine

## 2023-04-29 NOTE — Telephone Encounter (Signed)
FYI, closing encounter.

## 2023-04-29 NOTE — Telephone Encounter (Signed)
Copied from CRM [id number]. Topic: General - Other >> Apr 29, 2023 11:34 AM Elle L wrote: Reason for CRM: Stacy Occupational Therapist with [facility] called to make Dr. Louanne Skye aware that she is sending a fax for new evaluation orders for the patient as it has been rescheduled to next week. Her number is [phone] if needed.

## 2023-05-01 DIAGNOSIS — N183 Chronic kidney disease, stage 3 unspecified: Secondary | ICD-10-CM | POA: Diagnosis not present

## 2023-05-01 DIAGNOSIS — M19011 Primary osteoarthritis, right shoulder: Secondary | ICD-10-CM | POA: Diagnosis not present

## 2023-05-01 DIAGNOSIS — I447 Left bundle-branch block, unspecified: Secondary | ICD-10-CM | POA: Diagnosis not present

## 2023-05-01 DIAGNOSIS — G8929 Other chronic pain: Secondary | ICD-10-CM | POA: Diagnosis not present

## 2023-05-01 DIAGNOSIS — F03B4 Unspecified dementia, moderate, with anxiety: Secondary | ICD-10-CM | POA: Diagnosis not present

## 2023-05-01 DIAGNOSIS — I129 Hypertensive chronic kidney disease with stage 1 through stage 4 chronic kidney disease, or unspecified chronic kidney disease: Secondary | ICD-10-CM | POA: Diagnosis not present

## 2023-05-01 DIAGNOSIS — E1122 Type 2 diabetes mellitus with diabetic chronic kidney disease: Secondary | ICD-10-CM | POA: Diagnosis not present

## 2023-05-01 DIAGNOSIS — S82122D Displaced fracture of lateral condyle of left tibia, subsequent encounter for closed fracture with routine healing: Secondary | ICD-10-CM | POA: Diagnosis not present

## 2023-05-01 DIAGNOSIS — I2510 Atherosclerotic heart disease of native coronary artery without angina pectoris: Secondary | ICD-10-CM | POA: Diagnosis not present

## 2023-05-02 DIAGNOSIS — I129 Hypertensive chronic kidney disease with stage 1 through stage 4 chronic kidney disease, or unspecified chronic kidney disease: Secondary | ICD-10-CM | POA: Diagnosis not present

## 2023-05-02 DIAGNOSIS — M19011 Primary osteoarthritis, right shoulder: Secondary | ICD-10-CM | POA: Diagnosis not present

## 2023-05-02 DIAGNOSIS — S82122D Displaced fracture of lateral condyle of left tibia, subsequent encounter for closed fracture with routine healing: Secondary | ICD-10-CM | POA: Diagnosis not present

## 2023-05-02 DIAGNOSIS — G8929 Other chronic pain: Secondary | ICD-10-CM | POA: Diagnosis not present

## 2023-05-02 DIAGNOSIS — F03B4 Unspecified dementia, moderate, with anxiety: Secondary | ICD-10-CM | POA: Diagnosis not present

## 2023-05-02 DIAGNOSIS — E1122 Type 2 diabetes mellitus with diabetic chronic kidney disease: Secondary | ICD-10-CM | POA: Diagnosis not present

## 2023-05-02 DIAGNOSIS — N183 Chronic kidney disease, stage 3 unspecified: Secondary | ICD-10-CM | POA: Diagnosis not present

## 2023-05-02 DIAGNOSIS — I2510 Atherosclerotic heart disease of native coronary artery without angina pectoris: Secondary | ICD-10-CM | POA: Diagnosis not present

## 2023-05-02 DIAGNOSIS — I447 Left bundle-branch block, unspecified: Secondary | ICD-10-CM | POA: Diagnosis not present

## 2023-05-06 DIAGNOSIS — S82122D Displaced fracture of lateral condyle of left tibia, subsequent encounter for closed fracture with routine healing: Secondary | ICD-10-CM | POA: Diagnosis not present

## 2023-05-07 ENCOUNTER — Telehealth: Payer: Self-pay

## 2023-05-07 DIAGNOSIS — S82122S Displaced fracture of lateral condyle of left tibia, sequela: Secondary | ICD-10-CM

## 2023-05-07 DIAGNOSIS — N183 Chronic kidney disease, stage 3 unspecified: Secondary | ICD-10-CM | POA: Diagnosis not present

## 2023-05-07 DIAGNOSIS — I2510 Atherosclerotic heart disease of native coronary artery without angina pectoris: Secondary | ICD-10-CM | POA: Diagnosis not present

## 2023-05-07 DIAGNOSIS — E1122 Type 2 diabetes mellitus with diabetic chronic kidney disease: Secondary | ICD-10-CM | POA: Diagnosis not present

## 2023-05-07 DIAGNOSIS — I447 Left bundle-branch block, unspecified: Secondary | ICD-10-CM | POA: Diagnosis not present

## 2023-05-07 DIAGNOSIS — G8929 Other chronic pain: Secondary | ICD-10-CM | POA: Diagnosis not present

## 2023-05-07 DIAGNOSIS — I129 Hypertensive chronic kidney disease with stage 1 through stage 4 chronic kidney disease, or unspecified chronic kidney disease: Secondary | ICD-10-CM | POA: Diagnosis not present

## 2023-05-07 DIAGNOSIS — S82122D Displaced fracture of lateral condyle of left tibia, subsequent encounter for closed fracture with routine healing: Secondary | ICD-10-CM | POA: Diagnosis not present

## 2023-05-07 DIAGNOSIS — M19011 Primary osteoarthritis, right shoulder: Secondary | ICD-10-CM | POA: Diagnosis not present

## 2023-05-07 DIAGNOSIS — F03B4 Unspecified dementia, moderate, with anxiety: Secondary | ICD-10-CM | POA: Diagnosis not present

## 2023-05-07 NOTE — Telephone Encounter (Signed)
Copied from CRM [id number]. Topic: General - Other >> May 07, 2023 11:47 AM Franchot Heidelberg wrote: Reason for CRM: Home Health needs an order for a heating pad PRN effective today just needs signature from PCP.   Nicanor Alcon calling from [place] contact: [phone]

## 2023-05-08 NOTE — Telephone Encounter (Signed)
Printed out order for heating pad

## 2023-05-08 NOTE — Addendum Note (Signed)
Addended by: Arville Care on: 05/08/2023 08:56 AM   Modules accepted: Orders

## 2023-05-09 DIAGNOSIS — S82122D Displaced fracture of lateral condyle of left tibia, subsequent encounter for closed fracture with routine healing: Secondary | ICD-10-CM | POA: Diagnosis not present

## 2023-05-09 DIAGNOSIS — G8929 Other chronic pain: Secondary | ICD-10-CM | POA: Diagnosis not present

## 2023-05-09 DIAGNOSIS — I129 Hypertensive chronic kidney disease with stage 1 through stage 4 chronic kidney disease, or unspecified chronic kidney disease: Secondary | ICD-10-CM | POA: Diagnosis not present

## 2023-05-09 DIAGNOSIS — M19011 Primary osteoarthritis, right shoulder: Secondary | ICD-10-CM | POA: Diagnosis not present

## 2023-05-09 DIAGNOSIS — N183 Chronic kidney disease, stage 3 unspecified: Secondary | ICD-10-CM | POA: Diagnosis not present

## 2023-05-09 DIAGNOSIS — F03B4 Unspecified dementia, moderate, with anxiety: Secondary | ICD-10-CM | POA: Diagnosis not present

## 2023-05-09 DIAGNOSIS — I447 Left bundle-branch block, unspecified: Secondary | ICD-10-CM | POA: Diagnosis not present

## 2023-05-09 DIAGNOSIS — E1122 Type 2 diabetes mellitus with diabetic chronic kidney disease: Secondary | ICD-10-CM | POA: Diagnosis not present

## 2023-05-09 DIAGNOSIS — I2510 Atherosclerotic heart disease of native coronary artery without angina pectoris: Secondary | ICD-10-CM | POA: Diagnosis not present

## 2023-05-09 NOTE — Telephone Encounter (Signed)
Order will actually be faxed over from [facility] to be signed.

## 2023-05-14 DIAGNOSIS — N183 Chronic kidney disease, stage 3 unspecified: Secondary | ICD-10-CM | POA: Diagnosis not present

## 2023-05-14 DIAGNOSIS — I447 Left bundle-branch block, unspecified: Secondary | ICD-10-CM | POA: Diagnosis not present

## 2023-05-14 DIAGNOSIS — I129 Hypertensive chronic kidney disease with stage 1 through stage 4 chronic kidney disease, or unspecified chronic kidney disease: Secondary | ICD-10-CM | POA: Diagnosis not present

## 2023-05-14 DIAGNOSIS — I2510 Atherosclerotic heart disease of native coronary artery without angina pectoris: Secondary | ICD-10-CM | POA: Diagnosis not present

## 2023-05-14 DIAGNOSIS — E1122 Type 2 diabetes mellitus with diabetic chronic kidney disease: Secondary | ICD-10-CM | POA: Diagnosis not present

## 2023-05-14 DIAGNOSIS — G8929 Other chronic pain: Secondary | ICD-10-CM | POA: Diagnosis not present

## 2023-05-14 DIAGNOSIS — M19011 Primary osteoarthritis, right shoulder: Secondary | ICD-10-CM | POA: Diagnosis not present

## 2023-05-14 DIAGNOSIS — F03B4 Unspecified dementia, moderate, with anxiety: Secondary | ICD-10-CM | POA: Diagnosis not present

## 2023-05-14 DIAGNOSIS — S82122D Displaced fracture of lateral condyle of left tibia, subsequent encounter for closed fracture with routine healing: Secondary | ICD-10-CM | POA: Diagnosis not present

## 2023-05-15 DIAGNOSIS — F03B4 Unspecified dementia, moderate, with anxiety: Secondary | ICD-10-CM | POA: Diagnosis not present

## 2023-05-15 DIAGNOSIS — I129 Hypertensive chronic kidney disease with stage 1 through stage 4 chronic kidney disease, or unspecified chronic kidney disease: Secondary | ICD-10-CM | POA: Diagnosis not present

## 2023-05-15 DIAGNOSIS — I447 Left bundle-branch block, unspecified: Secondary | ICD-10-CM | POA: Diagnosis not present

## 2023-05-15 DIAGNOSIS — M19011 Primary osteoarthritis, right shoulder: Secondary | ICD-10-CM | POA: Diagnosis not present

## 2023-05-15 DIAGNOSIS — G8929 Other chronic pain: Secondary | ICD-10-CM | POA: Diagnosis not present

## 2023-05-15 DIAGNOSIS — I2510 Atherosclerotic heart disease of native coronary artery without angina pectoris: Secondary | ICD-10-CM | POA: Diagnosis not present

## 2023-05-15 DIAGNOSIS — E1122 Type 2 diabetes mellitus with diabetic chronic kidney disease: Secondary | ICD-10-CM | POA: Diagnosis not present

## 2023-05-15 DIAGNOSIS — N183 Chronic kidney disease, stage 3 unspecified: Secondary | ICD-10-CM | POA: Diagnosis not present

## 2023-05-15 DIAGNOSIS — S82122D Displaced fracture of lateral condyle of left tibia, subsequent encounter for closed fracture with routine healing: Secondary | ICD-10-CM | POA: Diagnosis not present

## 2023-05-16 DIAGNOSIS — F03B4 Unspecified dementia, moderate, with anxiety: Secondary | ICD-10-CM | POA: Diagnosis not present

## 2023-05-16 DIAGNOSIS — I447 Left bundle-branch block, unspecified: Secondary | ICD-10-CM | POA: Diagnosis not present

## 2023-05-16 DIAGNOSIS — G8929 Other chronic pain: Secondary | ICD-10-CM | POA: Diagnosis not present

## 2023-05-16 DIAGNOSIS — N183 Chronic kidney disease, stage 3 unspecified: Secondary | ICD-10-CM | POA: Diagnosis not present

## 2023-05-16 DIAGNOSIS — I2510 Atherosclerotic heart disease of native coronary artery without angina pectoris: Secondary | ICD-10-CM | POA: Diagnosis not present

## 2023-05-16 DIAGNOSIS — M19011 Primary osteoarthritis, right shoulder: Secondary | ICD-10-CM | POA: Diagnosis not present

## 2023-05-16 DIAGNOSIS — I129 Hypertensive chronic kidney disease with stage 1 through stage 4 chronic kidney disease, or unspecified chronic kidney disease: Secondary | ICD-10-CM | POA: Diagnosis not present

## 2023-05-16 DIAGNOSIS — E1122 Type 2 diabetes mellitus with diabetic chronic kidney disease: Secondary | ICD-10-CM | POA: Diagnosis not present

## 2023-05-16 DIAGNOSIS — S82122D Displaced fracture of lateral condyle of left tibia, subsequent encounter for closed fracture with routine healing: Secondary | ICD-10-CM | POA: Diagnosis not present

## 2023-05-19 ENCOUNTER — Other Ambulatory Visit: Payer: Self-pay | Admitting: Family Medicine

## 2023-05-19 DIAGNOSIS — G8929 Other chronic pain: Secondary | ICD-10-CM | POA: Diagnosis not present

## 2023-05-19 DIAGNOSIS — M19011 Primary osteoarthritis, right shoulder: Secondary | ICD-10-CM | POA: Diagnosis not present

## 2023-05-19 DIAGNOSIS — I447 Left bundle-branch block, unspecified: Secondary | ICD-10-CM | POA: Diagnosis not present

## 2023-05-19 DIAGNOSIS — N183 Chronic kidney disease, stage 3 unspecified: Secondary | ICD-10-CM

## 2023-05-19 DIAGNOSIS — I2510 Atherosclerotic heart disease of native coronary artery without angina pectoris: Secondary | ICD-10-CM | POA: Diagnosis not present

## 2023-05-19 DIAGNOSIS — F03B4 Unspecified dementia, moderate, with anxiety: Secondary | ICD-10-CM | POA: Diagnosis not present

## 2023-05-19 DIAGNOSIS — I129 Hypertensive chronic kidney disease with stage 1 through stage 4 chronic kidney disease, or unspecified chronic kidney disease: Secondary | ICD-10-CM | POA: Diagnosis not present

## 2023-05-19 DIAGNOSIS — N1832 Chronic kidney disease, stage 3b: Secondary | ICD-10-CM

## 2023-05-19 DIAGNOSIS — S82122D Displaced fracture of lateral condyle of left tibia, subsequent encounter for closed fracture with routine healing: Secondary | ICD-10-CM | POA: Diagnosis not present

## 2023-05-19 DIAGNOSIS — E1122 Type 2 diabetes mellitus with diabetic chronic kidney disease: Secondary | ICD-10-CM | POA: Diagnosis not present

## 2023-05-20 ENCOUNTER — Other Ambulatory Visit: Payer: Self-pay | Admitting: Family Medicine

## 2023-05-20 DIAGNOSIS — E1159 Type 2 diabetes mellitus with other circulatory complications: Secondary | ICD-10-CM

## 2023-05-20 DIAGNOSIS — I152 Hypertension secondary to endocrine disorders: Secondary | ICD-10-CM

## 2023-05-30 ENCOUNTER — Encounter: Payer: Self-pay | Admitting: Family Medicine

## 2023-05-30 ENCOUNTER — Ambulatory Visit: Payer: Medicare HMO | Admitting: Family Medicine

## 2023-06-03 ENCOUNTER — Other Ambulatory Visit: Payer: Self-pay | Admitting: *Deleted

## 2023-06-03 MED ORDER — TRUE METRIX AIR GLUCOSE METER W/DEVICE KIT: PACK | 1 refills | Status: AC

## 2023-06-10 ENCOUNTER — Other Ambulatory Visit: Payer: Self-pay | Admitting: Family Medicine

## 2023-06-10 DIAGNOSIS — Z1231 Encounter for screening mammogram for malignant neoplasm of breast: Secondary | ICD-10-CM

## 2023-06-11 ENCOUNTER — Inpatient Hospital Stay: Admission: RE | Admit: 2023-06-11 | Payer: Medicare HMO | Source: Ambulatory Visit

## 2023-06-20 ENCOUNTER — Encounter: Payer: Self-pay | Admitting: Family Medicine

## 2023-06-20 ENCOUNTER — Ambulatory Visit: Admitting: Family Medicine

## 2023-06-20 VITALS — BP 134/69 | HR 67 | Ht 63.0 in | Wt 144.0 lb

## 2023-06-20 DIAGNOSIS — E1169 Type 2 diabetes mellitus with other specified complication: Secondary | ICD-10-CM

## 2023-06-20 DIAGNOSIS — E7800 Pure hypercholesterolemia, unspecified: Secondary | ICD-10-CM

## 2023-06-20 DIAGNOSIS — E785 Hyperlipidemia, unspecified: Secondary | ICD-10-CM

## 2023-06-20 DIAGNOSIS — I10 Essential (primary) hypertension: Secondary | ICD-10-CM | POA: Diagnosis not present

## 2023-06-20 DIAGNOSIS — I2510 Atherosclerotic heart disease of native coronary artery without angina pectoris: Secondary | ICD-10-CM

## 2023-06-20 DIAGNOSIS — Z1231 Encounter for screening mammogram for malignant neoplasm of breast: Secondary | ICD-10-CM | POA: Diagnosis not present

## 2023-06-20 DIAGNOSIS — E1122 Type 2 diabetes mellitus with diabetic chronic kidney disease: Secondary | ICD-10-CM | POA: Diagnosis not present

## 2023-06-20 DIAGNOSIS — B351 Tinea unguium: Secondary | ICD-10-CM

## 2023-06-20 DIAGNOSIS — E1159 Type 2 diabetes mellitus with other circulatory complications: Secondary | ICD-10-CM | POA: Diagnosis not present

## 2023-06-20 DIAGNOSIS — Z7984 Long term (current) use of oral hypoglycemic drugs: Secondary | ICD-10-CM

## 2023-06-20 DIAGNOSIS — N183 Chronic kidney disease, stage 3 unspecified: Secondary | ICD-10-CM | POA: Diagnosis not present

## 2023-06-20 DIAGNOSIS — I152 Hypertension secondary to endocrine disorders: Secondary | ICD-10-CM | POA: Diagnosis not present

## 2023-06-20 LAB — BAYER DCA HB A1C WAIVED: HB A1C (BAYER DCA - WAIVED): 12 % — ABNORMAL HIGH (ref 4.8–5.6)

## 2023-06-20 MED ORDER — TOUJEO SOLOSTAR 300 UNIT/ML ~~LOC~~ SOPN: 10.0000 [IU] | PEN_INJECTOR | Freq: Every day | SUBCUTANEOUS | 3 refills | Status: DC

## 2023-06-20 NOTE — Addendum Note (Signed)
 Addended by: Arville Care on: 06/20/2023 03:04 PM   Modules accepted: Orders

## 2023-06-20 NOTE — Progress Notes (Addendum)
 BP 134/69   Pulse 67   Ht 5\' 3"  (1.6 m)   Wt 144 lb (65.3 kg)   SpO2 97%   BMI 25.51 kg/m    Subjective:   Patient ID: Tonya Carpenter, female    DOB: [birth year], 78 y.o.   MRN: [id number]  HPI: Tonya Carpenter is a 78 y.o. female presenting on 06/20/2023 for Medical Management of Chronic Issues, Diabetes, and Hyperlipidemia   HPI Type 2 diabetes mellitus Patient comes in today for recheck of his diabetes. Patient has been currently taking Janumet. Patient is currently on an ACE inhibitor/ARB. Patient has not seen an ophthalmologist this year. Patient denies any new issues with their feet. The symptom started onset as an adult hypertension and hyperlipidemia and CAD and CKD ARE RELATED TO DM   Hypertension Patient is currently on losartan-hydrochlorothiazide and metoprolol, and their blood pressure today is 134/69. Patient denies any lightheadedness or dizziness. Patient denies headaches, blurred vision, chest pains, shortness of breath, or weakness. Denies any side effects from medication and is content with current medication.   Hyperlipidemia and CAD Patient is coming in for recheck of his hyperlipidemia. The patient is currently taking atorvastatin. They deny any issues with myalgias or history of liver damage from it. They deny any focal numbness or weakness or chest pain.   Relevant past medical, surgical, family and social history reviewed and updated as indicated. Interim medical history since our last visit reviewed. Allergies and medications reviewed and updated.  Review of Systems  Constitutional:  Negative for chills and fever.  Eyes:  Negative for visual disturbance.  Respiratory:  Negative for chest tightness and shortness of breath.   Cardiovascular:  Negative for chest pain and leg swelling.  Genitourinary:  Negative for difficulty urinating and dysuria.  Musculoskeletal:  Negative for back pain and gait problem.  Skin:  Negative for rash.  Neurological:  Negative for  dizziness, light-headedness and headaches.  Psychiatric/Behavioral:  Negative for agitation and behavioral problems.   All other systems reviewed and are negative.   Per HPI unless specifically indicated above   Allergies as of 06/20/2023       Reactions   Trilyte [peg 3350-kcl-na Bicarb-nacl] Nausea And Vomiting        Medication List        Accurate as of June 20, 2023  2:41 PM. If you have any questions, ask your nurse or doctor.          STOP taking these medications    ALPRAZolam 0.25 MG tablet Commonly known as: XANAX Stopped by: Elige Radon Herron Fero   methocarbamol 500 MG tablet Commonly known as: ROBAXIN Stopped by: Elige Radon Meilani Edmundson   oxyCODONE 5 MG immediate release tablet Commonly known as: Oxy IR/ROXICODONE Stopped by: Elige Radon Nishita Isaacks       TAKE these medications    acetaminophen 500 MG tablet Commonly known as: TYLENOL Take 1,000 mg by mouth every 6 (six) hours as needed for mild pain or headache.   aspirin EC 81 MG tablet Commonly known as: Aspirin Low Dose TAKE 1 TABLET EVERY DAY - SWALLOW WHOLE   atorvastatin 20 MG tablet Commonly known as: LIPITOR Take 1 tablet (20 mg total) by mouth daily.   cholecalciferol 25 MCG (1000 UNIT) tablet Commonly known as: VITAMIN D3 Take 1 tablet (1,000 Units total) by mouth daily.   diclofenac Sodium 1 % Gel Commonly known as: VOLTAREN Apply 2 g topically 4 (four) times daily as needed.   DropSafe  Alcohol Prep 70 % Pads TEST BS BID and as needed Dx E11.9   Janumet XR 50-500 MG Tb24 Generic drug: SitaGLIPtin-MetFORMIN HCl TAKE 1 TABLET TWICE DAILY   losartan-hydrochlorothiazide 50-12.5 MG tablet Commonly known as: HYZAAR TAKE 1 TABLET EVERY DAY   metoprolol succinate 50 MG 24 hr tablet Commonly known as: TOPROL-XL Take 1 tablet (50 mg total) by mouth daily. Take with or immediately following a meal.   nitroGLYCERIN 0.4 MG SL tablet Commonly known as: NITROSTAT DISSOLVE 1 TABLET UNDER THE  TONGUE EVERY 5 MINUTES AS NEEDED FOR CHEST PAIN   omeprazole 40 MG capsule Commonly known as: PRILOSEC Take 1 capsule (40 mg total) by mouth daily.   Prodigy Twist Top Lancets 28G Misc TEST BS BID and as needed Dx E11.9   REFRESH RELIEVA OP Apply 1 drop to eye in the morning and at bedtime.   True Metrix Air Glucose Meter w/Device Kit TEST BS BID and as needed Dx E11.9   True Metrix Blood Glucose Test test strip Generic drug: glucose blood TEST BS BID and as needed Dx E11.9   True Metrix Level 1 Low Soln Use as needed with BS machine   TRUEdraw Lancing Device Misc Teset BS BID Dx E11.9         Objective:   BP 134/69   Pulse 67   Ht 5\' 3"  (1.6 m)   Wt 144 lb (65.3 kg)   SpO2 97%   BMI 25.51 kg/m   Wt Readings from Last 3 Encounters:  06/20/23 144 lb (65.3 kg)  04/24/23 147 lb (66.7 kg)  04/11/23 147 lb (66.7 kg)    Physical Exam Vitals and nursing note reviewed.  Constitutional:      General: She is not in acute distress.    Appearance: She is well-developed. She is not diaphoretic.  Eyes:     Conjunctiva/sclera: Conjunctivae normal.     Pupils: Pupils are equal, round, and reactive to light.  Cardiovascular:     Rate and Rhythm: Normal rate and regular rhythm.     Heart sounds: Normal heart sounds. No murmur heard. Pulmonary:     Effort: Pulmonary effort is normal. No respiratory distress.     Breath sounds: Normal breath sounds. No wheezing.  Musculoskeletal:        General: No tenderness. Normal range of motion.  Skin:    General: Skin is warm and dry.     Findings: No rash.  Neurological:     Mental Status: She is alert and oriented to person, place, and time.     Coordination: Coordination normal.  Psychiatric:        Behavior: Behavior normal.       Assessment & Plan:   Problem List Items Addressed This Visit       Cardiovascular and Mediastinum   Hypertension   Coronary artery disease     Endocrine   Type 2 diabetes mellitus  (HCC) - Primary   Relevant Orders   Bayer DCA Hb A1c Waived   CBC with Differential/Platelet   CMP14+EGFR   Lipid panel   Microalbumin/Creatinine Ratio, Urine   CKD stage 3 due to type 2 diabetes mellitus (HCC)     Other   Hyperlipidemia   Other Visit Diagnoses       Hypertension associated with diabetes (HCC)       Relevant Orders   Bayer DCA Hb A1c Waived   CBC with Differential/Platelet   CMP14+EGFR   Lipid panel  Hyperlipidemia associated with type 2 diabetes mellitus (HCC)       Relevant Orders   Bayer DCA Hb A1c Waived   CBC with Differential/Platelet   CMP14+EGFR   Lipid panel     Encounter for screening mammogram for malignant neoplasm of breast       Relevant Orders   MM 3D DIAGNOSTIC MAMMOGRAM BILATERAL BREAST     A1c is 12.0, patient is already on Janumet, will start insulin once daily.   Diabetic Foot Exam - Simple   Simple Foot Form Diabetic Foot exam was performed with the following findings: Yes 06/20/2023  3:04 PM  Visual Inspection No deformities, no ulcerations, no other skin breakdown bilaterally: Yes Sensation Testing Intact to touch and monofilament testing bilaterally: Yes Pulse Check Posterior Tibialis and Dorsalis pulse intact bilaterally: Yes Comments Calluses on both lateral aspects of the feet.     Follow up plan: Return in about 3 months (around 09/19/2023), or if symptoms worsen or fail to improve, for dm and htn and hld.  Counseling provided for all of the vaccine components Orders Placed This Encounter  Procedures   MM 3D DIAGNOSTIC MAMMOGRAM BILATERAL BREAST   Bayer DCA Hb A1c Waived   CBC with Differential/Platelet   CMP14+EGFR   Lipid panel   Microalbumin/Creatinine Ratio, Urine    Arville Care, MD [facility] 06/20/2023, 2:41 PM

## 2023-06-21 LAB — CMP14+EGFR
ALT: 12 [IU]/L (ref 0–32)
AST: 18 [IU]/L (ref 0–40)
Albumin: 4.1 g/dL (ref 3.8–4.8)
Alkaline Phosphatase: 76 [IU]/L (ref 44–121)
BUN/Creatinine Ratio: 16 (ref 12–28)
BUN: 21 mg/dL (ref 8–27)
Bilirubin Total: 0.4 mg/dL (ref 0.0–1.2)
CO2: 22 mmol/L (ref 20–29)
Calcium: 10 mg/dL (ref 8.7–10.3)
Chloride: 97 mmol/L (ref 96–106)
Creatinine, Ser: 1.29 mg/dL — ABNORMAL HIGH (ref 0.57–1.00)
Globulin, Total: 3.7 g/dL (ref 1.5–4.5)
Glucose: 342 mg/dL — ABNORMAL HIGH (ref 70–99)
Potassium: 4.1 mmol/L (ref 3.5–5.2)
Sodium: 138 mmol/L (ref 134–144)
Total Protein: 7.8 g/dL (ref 6.0–8.5)
eGFR: 43 mL/min/{1.73_m2} — ABNORMAL LOW

## 2023-06-21 LAB — CBC WITH DIFFERENTIAL/PLATELET
Basophils Absolute: 0 10*3/uL (ref 0.0–0.2)
Basos: 0 %
EOS (ABSOLUTE): 0.1 10*3/uL (ref 0.0–0.4)
Eos: 1 %
Hematocrit: 38.2 % (ref 34.0–46.6)
Hemoglobin: 11.8 g/dL (ref 11.1–15.9)
Immature Grans (Abs): 0 10*3/uL (ref 0.0–0.1)
Immature Granulocytes: 0 %
Lymphocytes Absolute: 2.7 10*3/uL (ref 0.7–3.1)
Lymphs: 28 %
MCH: 27.1 pg (ref 26.6–33.0)
MCHC: 30.9 g/dL — ABNORMAL LOW (ref 31.5–35.7)
MCV: 88 fL (ref 79–97)
Monocytes Absolute: 0.7 10*3/uL (ref 0.1–0.9)
Monocytes: 7 %
Neutrophils Absolute: 6.2 10*3/uL (ref 1.4–7.0)
Neutrophils: 64 %
Platelets: 266 10*3/uL (ref 150–450)
RBC: 4.35 x10E6/uL (ref 3.77–5.28)
RDW: 12.5 % (ref 11.7–15.4)
WBC: 9.8 10*3/uL (ref 3.4–10.8)

## 2023-06-21 LAB — LIPID PANEL
Chol/HDL Ratio: 2.5 ratio (ref 0.0–4.4)
Cholesterol, Total: 131 mg/dL (ref 100–199)
HDL: 53 mg/dL
LDL Chol Calc (NIH): 59 mg/dL (ref 0–99)
Triglycerides: 101 mg/dL (ref 0–149)
VLDL Cholesterol Cal: 19 mg/dL (ref 5–40)

## 2023-06-22 LAB — MICROALBUMIN / CREATININE URINE RATIO
Creatinine, Urine: 117.5 mg/dL
Microalb/Creat Ratio: 388 mg/g{creat} — ABNORMAL HIGH (ref 0–29)
Microalbumin, Urine: 456.1 ug/mL

## 2023-06-23 ENCOUNTER — Telehealth: Payer: Self-pay | Admitting: Family Medicine

## 2023-06-23 NOTE — Telephone Encounter (Signed)
 Copied from CRM [id number]. Topic: Clinical - Medical Advice >> Jun 23, 2023  3:42 PM Turkey A wrote: Reason for CRM: Patient received Insulin needs but is not sure how to use needles

## 2023-06-23 NOTE — Telephone Encounter (Signed)
 Nurse visit scheduled for 2:30 4/8. Niece made aware.

## 2023-06-24 ENCOUNTER — Ambulatory Visit (INDEPENDENT_AMBULATORY_CARE_PROVIDER_SITE_OTHER)

## 2023-06-24 NOTE — Progress Notes (Signed)
 Patient is in office today for a nurse visit for  insulin instruction. Niece was accompanying patient, both understood instruction . Patient was provided with instruction and demonstration on how to Administer Medication.

## 2023-06-27 ENCOUNTER — Encounter: Payer: Self-pay | Admitting: Family Medicine

## 2023-07-03 ENCOUNTER — Encounter: Payer: Self-pay | Admitting: Neurology

## 2023-07-03 ENCOUNTER — Ambulatory Visit (INDEPENDENT_AMBULATORY_CARE_PROVIDER_SITE_OTHER): Payer: Medicare HMO | Admitting: Neurology

## 2023-07-03 VITALS — BP 139/74 | HR 57 | Resp 17 | Ht 66.0 in

## 2023-07-03 DIAGNOSIS — Z9181 History of falling: Secondary | ICD-10-CM | POA: Diagnosis not present

## 2023-07-03 DIAGNOSIS — G301 Alzheimer's disease with late onset: Secondary | ICD-10-CM | POA: Diagnosis not present

## 2023-07-03 DIAGNOSIS — F02A0 Dementia in other diseases classified elsewhere, mild, without behavioral disturbance, psychotic disturbance, mood disturbance, and anxiety: Secondary | ICD-10-CM | POA: Diagnosis not present

## 2023-07-03 NOTE — Patient Instructions (Signed)
 Dementia labs including TSH, B12 and ATN profile  Will check head CT  Will hold acetylcholine esterase inhibitor due to low heart rate Continue current medications  Follow up in a year or sooner if worse     There are well-accepted and sensible ways to reduce risk for Alzheimers disease and other degenerative brain disorders .  Exercise Daily Walk A daily 20 minute walk should be part of your routine. Disease related apathy can be a significant roadblock to exercise and the only way to overcome this is to make it a daily routine and perhaps have a reward at the end (something your loved one loves to eat or drink perhaps) or a personal trainer coming to the home can also be very useful. Most importantly, the patient is much more likely to exercise if the caregiver / spouse does it with him/her. In general a structured, repetitive schedule is best.  General Health: Any diseases which effect your body will effect your brain such as a pneumonia, urinary infection, blood clot, heart attack or stroke. Keep contact with your primary care doctor for regular follow ups.  Sleep. A good nights sleep is healthy for the brain. Seven hours is recommended. If you have insomnia or poor sleep habits we can give you some instructions. If you have sleep apnea wear your mask.  Diet: Eating a heart healthy diet is also a good idea; fish and poultry instead of red meat, nuts (mostly non-peanuts), vegetables, fruits, olive oil or canola oil (instead of butter), minimal salt (use other spices to flavor foods), whole grain rice, bread, cereal and pasta and wine in moderation.Research is now showing that the MIND diet, which is a combination of The Mediterranean diet and the DASH diet, is beneficial for cognitive processing and longevity. Information about this diet can be found in The MIND Diet, a book by Andria Keeler, MS, RDN, and online at [employer]Wild[employer].es  Finances, Power of [address] and  Advance Directives: You should consider putting legal safeguards in place with regard to financial and medical decision making. While the spouse always has power of attorney for medical and financial issues in the absence of any form, you should consider what you want in case the spouse / caregiver is no longer around or capable of making decisions.
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
## 2023-07-08 LAB — ATN PROFILE
A -- Beta-amyloid 42/40 Ratio: 0.115 (ref >0.102)
Beta-amyloid 40: 277.26 pg/mL
Beta-amyloid 42: 31.85 pg/mL
N -- NfL, Plasma: 4.58 pg/mL (ref 0.00–7.64)
T -- p-tau181: 0.99 pg/mL — ABNORMAL HIGH (ref 0.00–0.97)

## 2023-07-08 LAB — TSH: TSH: 1.25 u[IU]/mL (ref 0.450–4.500)

## 2023-07-08 LAB — VITAMIN B12: Vitamin B-12: 495 pg/mL (ref 232–1245)

## 2023-07-08 NOTE — Progress Notes (Signed)
 Please call and advise the patient that the recent labs we checked did not show presence of Alzheimer disease biomarkers. Please inform them that Rikita likely has another type of dementia, likely vascular dementia.  No further action is required on these tests at this time. Please remind patient to keep any upcoming appointments or tests and to call us  with any interim questions, concerns, problems or updates. Thanks,   Cassandra Cleveland, MD

## 2023-07-09 NOTE — Progress Notes (Unsigned)
 This encounter was created in error - please disregard.

## 2023-07-10 ENCOUNTER — Telehealth: Payer: Self-pay | Admitting: Neurology

## 2023-07-10 NOTE — Telephone Encounter (Signed)
 Tonya Carpenter: [id number] exp. 07/10/23-09/08/23 sent to GI [phone]

## 2023-07-18 ENCOUNTER — Encounter: Payer: Self-pay | Admitting: Neurology

## 2023-07-23 ENCOUNTER — Ambulatory Visit
Admission: RE | Admit: 2023-07-23 | Discharge: 2023-07-23 | Disposition: A | Discharge status: Home / Self Care | Source: Ambulatory Visit | Attending: Neurology | Admitting: Neurology

## 2023-07-23 DIAGNOSIS — G301 Alzheimer's disease with late onset: Secondary | ICD-10-CM | POA: Diagnosis not present

## 2023-07-23 DIAGNOSIS — R413 Other amnesia: Secondary | ICD-10-CM | POA: Diagnosis not present

## 2023-07-27 NOTE — Progress Notes (Signed)
 Please call and advise the patient/family that the recent head CT did not show any acute findings, such as a stroke, or mass or blood products, however it showed the old left sided stroke. No further action is required on this test at this time. Please remind patient to keep any upcoming appointments or tests and to call us  with any interim questions, concerns, problems or updates. Thanks,   Cassandra Cleveland, MD

## 2023-07-28 ENCOUNTER — Other Ambulatory Visit: Payer: Self-pay | Admitting: Family Medicine

## 2023-07-28 DIAGNOSIS — E1122 Type 2 diabetes mellitus with diabetic chronic kidney disease: Secondary | ICD-10-CM

## 2023-07-28 DIAGNOSIS — N1832 Chronic kidney disease, stage 3b: Secondary | ICD-10-CM

## 2023-07-29 ENCOUNTER — Other Ambulatory Visit (INDEPENDENT_AMBULATORY_CARE_PROVIDER_SITE_OTHER): Payer: Self-pay | Admitting: Pharmacist

## 2023-07-29 DIAGNOSIS — E119 Type 2 diabetes mellitus without complications: Secondary | ICD-10-CM

## 2023-07-29 DIAGNOSIS — S83262D Peripheral tear of lateral meniscus, current injury, left knee, subsequent encounter: Secondary | ICD-10-CM | POA: Diagnosis not present

## 2023-07-29 DIAGNOSIS — Z794 Long term (current) use of insulin: Secondary | ICD-10-CM

## 2023-07-29 DIAGNOSIS — S82122D Displaced fracture of lateral condyle of left tibia, subsequent encounter for closed fracture with routine healing: Secondary | ICD-10-CM | POA: Diagnosis not present

## 2023-07-29 NOTE — Progress Notes (Signed)
 07/29/2023 Name: Tonya Carpenter MRN: [id number] DOB: 27-Sep-1945  Chief Complaint  Patient presents with   Diabetes    Tonya Carpenter is a 78 y.o. year old female who presented for a telephone visit.   They were referred to the pharmacist by a quality report for assistance in managing diabetes.  Patient was identified as falling into the [place] Measure - Diabetes.  Of note, patient has underlying dementia diagnosis that complicates medication adherence.  She is living with her son and her cousin also assists with her care and appointments.  Phone call was placed to patient's cousin (Tonya Carpenter) and then with patient.  Patient was: Referred to pharmacy for chronic disease management.    Subjective:  Care Team: Primary Care Provider: [name], Lucio Sabin, MD ; Next Scheduled Visit: 09/2023   Medication Access/Adherence  Current Pharmacy:  [facility] [place], [place] - [address] [place] [address] Phone: [phone] Fax: [phone]  [facility] - [place], [place] - [address] [place] [address] Phone: [phone] Fax: [phone]   Patient reports affordability concerns with their medications: No  Patient reports access/transportation concerns to their pharmacy: No  Patient reports adherence concerns with their medications:  Yes     Diabetes:  Current medications: Toujeo  (new start in Tonya Carpenter 2025), janumet  XR  Medications tried in the past: glipizide , plain metformin , glimepiride   Current glucose readings: not checking; unable to use the glucometer appropriately-appt with PharmD set up for 07/31/23 Prescribed true metrix meter;  Current physical activity: limited due to ortho; ortho appt today 07/29/23  Current medication access support: medicare   Objective:  Lab Results  Component Value Date   HGBA1C 12.0 (H) 06/20/2023    Lab Results  Component Value Date   CREATININE  1.29 (H) 06/20/2023   BUN 21 06/20/2023   NA 138 06/20/2023   K 4.1 06/20/2023   CL 97 06/20/2023   CO2 22 06/20/2023    Lab Results  Component Value Date   CHOL 131 06/20/2023   HDL 53 06/20/2023   LDLCALC 59 06/20/2023   TRIG 101 06/20/2023   CHOLHDL 2.5 06/20/2023    Medications Reviewed Today     Reviewed by Tonya Carpenter, [facility] (Pharmacist) on 07/29/23 at 1021  Med List Status: <None>   Medication Order Taking? Sig Documenting Provider Last Dose Status Informant  acetaminophen  (TYLENOL ) 500 MG tablet [id number] No Take 1,000 mg by mouth every 6 (six) hours as needed for mild pain or headache.  [provider] Taking Active Self  Alcohol  Swabs (DROPSAFE ALCOHOL  PREP) 70 % PADS [id number] No TEST BS BID and as needed Dx E11.9 [name], Lucio Sabin, MD Taking Active Self  aspirin  EC (ASPIRIN  LOW DOSE) 81 MG tablet [id number] No TAKE 1 TABLET EVERY DAY - SWALLOW WHOLE [name], Lucio Sabin, MD Taking Active Self  atorvastatin  (LIPITOR) 20 MG tablet [id number] No Take 1 tablet (20 mg total) by mouth daily. [name], Lucio Sabin, MD Taking Active Self  Blood Glucose Calibration (TRUE METRIX LEVEL 1) Low SOLN [id number] No Use as needed with BS machine [name], Lucio Sabin, MD Taking Active Self  Blood Glucose Monitoring Suppl (TRUE METRIX AIR GLUCOSE METER) w/Device KIT [id number] No TEST BS BID and as needed Dx E11.9 [name], Lucio Sabin, MD Taking Active   Carboxymethylcellul-Glycerin [facility] RELIEVA OP) [id number] No Apply 1 drop to eye in the morning and at bedtime.  [provider] Taking Active Self  cholecalciferol  (VITAMIN D3) 25 MCG (1000 UNIT) tablet [id number] No Take 1 tablet (1,000 Units total) by mouth daily. [name], Lucio Sabin, MD Taking Active Self  diclofenac  Sodium (VOLTAREN ) 1 % GEL [id number] No Apply 2 g topically 4 (four) times daily as needed. [name], Lucio Sabin, MD Taking Active   glucose blood (TRUE METRIX BLOOD GLUCOSE TEST) test strip [id number] No TEST BS  BID and as needed Dx E11.9 [name], Lucio Sabin, MD Taking Active Self  insulin  glargine, 1 Unit Dial, (TOUJEO  SOLOSTAR) 300 UNIT/ML Solostar Pen [id number] No Inject 10 Units into the skin daily. [name], Lucio Sabin, MD Taking Active   JANUMET  XR 50-500 MG TB24 [id number]  TAKE 1 TABLET TWICE DAILY [name], Lucio Sabin, MD  Active   Lancet Devices (TRUEDRAW LANCING DEVICE) MISC [id number] No Teset BS BID Dx E11.9 [name], Lucio Sabin, MD Taking Active Self  losartan -hydrochlorothiazide  [facility]) 50-12.5 MG tablet [id number] No TAKE 1 TABLET EVERY DAY [name], Lucio Sabin, MD Taking Active   metoprolol  succinate (TOPROL -XL) 50 MG 24 hr tablet [id number] No Take 1 tablet (50 mg total) by mouth daily. Take with or immediately following a meal. [name], Lucio Sabin, MD Taking Active   nitroGLYCERIN  (NITROSTAT ) 0.4 MG SL tablet [id number] No DISSOLVE 1 TABLET UNDER THE TONGUE EVERY 5 MINUTES AS NEEDED FOR CHEST PAIN [name], Lucio Sabin, MD Taking Active Self  omeprazole  (PRILOSEC) 40 MG capsule [id number] No Take 1 capsule (40 mg total) by mouth daily. [name], Lucio Sabin, MD Taking Active Self  Prodigy Twist Top Lancets 28G MISC [id number]  TEST BLOOD SUGAR TWICE DAILY, AND AS NEEDED Dx E11.9 [name], Lucio Sabin, MD  Active              Assessment/Plan:   Diabetes: - Currently uncontrolled - Reviewed long term cardiovascular and renal outcomes of uncontrolled blood sugar - Reviewed goal A1c, goal fasting, and goal 2 hour post prandial glucose - Reviewed dietary modifications including: FOLLOWING A HEART HEALTHY DIET/HEALTHY PLATE METHOD - Recommend to continue current regimen Would consider once weekly GLP1 vs daily insulin  given limited cognition; caregivers could ensure once weekly injection was give  Continue current regimen for now - Patient denies personal or family history of multiple endocrine neoplasia type 2, medullary thyroid  cancer; personal history of pancreatitis or gallbladder disease. -  Recommend to check glucose daily (fasting) or if symptomatic   Follow Up Plan: PharmD appt 07/31/23  Marvell Slider, PharmD, BCACP, CPP Clinical Pharmacist, [facility]

## 2023-07-31 ENCOUNTER — Other Ambulatory Visit: Payer: Self-pay | Admitting: Family Medicine

## 2023-07-31 ENCOUNTER — Ambulatory Visit

## 2023-07-31 DIAGNOSIS — I152 Hypertension secondary to endocrine disorders: Secondary | ICD-10-CM

## 2023-07-31 NOTE — Addendum Note (Signed)
 Addended by: Adilson Grafton D on: 07/31/2023 03:19 PM   Modules accepted: Level of Service

## 2023-07-31 NOTE — Addendum Note (Signed)
 Addended by: Keairra Bardon D on: 07/31/2023 09:38 AM   Modules accepted: Level of Service

## 2023-08-06 ENCOUNTER — Telehealth: Payer: Self-pay | Admitting: Family Medicine

## 2023-08-06 NOTE — Telephone Encounter (Signed)
 Confirmed with pt that she is only on one injectable for her DM. Current med list states that pt is to take 10u of Insulin  daily. Pt states that is what she has been doing. Advised to check her blood sugars daily and to keep a journal of them to bring to her appt in July.
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
## 2023-08-06 NOTE — Telephone Encounter (Signed)
Spoke with pt.  Please see previous telephone encounter.

## 2023-08-14 ENCOUNTER — Telehealth: Payer: Self-pay | Admitting: Pharmacy Technician

## 2023-08-14 NOTE — Progress Notes (Signed)
   08/14/2023  Patient ID: Tonya Carpenter, female   DOB: Feb 23, 1946, 78 y.o.   MRN: [id number]  Patient engaged with clinical pharmacist for management of diabetes on 07/29/2023. Outreach by [employer] technician was requested.   Outreached patient to discuss diabetes medication management. Left voicemail for patient to return my call at their convenience.    Preslei Blakley, CPhT [facility] Office: [phone] Email: [email or web address]

## 2023-08-18 ENCOUNTER — Telehealth: Payer: Self-pay | Admitting: Pharmacy Technician
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
## 2023-08-20 ENCOUNTER — Other Ambulatory Visit (HOSPITAL_COMMUNITY): Payer: Self-pay

## 2023-08-20 ENCOUNTER — Other Ambulatory Visit (INDEPENDENT_AMBULATORY_CARE_PROVIDER_SITE_OTHER)

## 2023-08-20 DIAGNOSIS — Z794 Long term (current) use of insulin: Secondary | ICD-10-CM

## 2023-08-20 DIAGNOSIS — E119 Type 2 diabetes mellitus without complications: Secondary | ICD-10-CM

## 2023-08-20 NOTE — Progress Notes (Signed)
   08/20/2023 Name: Tonya Carpenter MRN: [id number] DOB: 02/12/1946  Chief Complaint  Patient presents with   Diabetes    Tonya Carpenter is a 77 y.o. year old female who presented for a telephone visit.   Patient was identified as falling into the [place] Measure - Diabetes. Patient was identified as falling into the [place] Measure - Diabetes. Of note, patient has underlying dementia diagnosis that complicates medication adherence. She is living with her son who helps her with her medications.   Patient was: Referred to pharmacy for chronic disease management.   Called patient for telephone visit scheduled today at 10 AM and family member said she was busy and requested a call back at 11 AM. Called her back at 51 AM and the patient requested to reschedule for later this week as her hands were hurting and she did not want to talk. Patient's son was also not available to discuss with us  today. PharmD visit rescheduled for Friday, 08/22/23 at 11 AM.  Georga Killings, PharmD PGY-1 Pharmacy Resident

## 2023-08-22 ENCOUNTER — Other Ambulatory Visit (INDEPENDENT_AMBULATORY_CARE_PROVIDER_SITE_OTHER): Admitting: Pharmacist

## 2023-08-22 ENCOUNTER — Other Ambulatory Visit

## 2023-08-22 DIAGNOSIS — Z794 Long term (current) use of insulin: Secondary | ICD-10-CM

## 2023-08-22 DIAGNOSIS — E119 Type 2 diabetes mellitus without complications: Secondary | ICD-10-CM

## 2023-08-22 DIAGNOSIS — Z7984 Long term (current) use of oral hypoglycemic drugs: Secondary | ICD-10-CM

## 2023-08-22 NOTE — Progress Notes (Signed)
 08/22/2023 Name: Tonya Carpenter MRN: [id number] DOB: [birth year]  Chief Complaint  Patient presents with   Diabetes    Tonya Carpenter is a 78 y.o. year old female who presented for a telephone visit. I connected with  Tonya Carpenter on 08/22/23 by telephone and verified that I am speaking with the correct person using two identifiers. I discussed the limitations of evaluation and management by telemedicine. The patient expressed understanding and agreed to proceed.  Patient was located in her home and PharmD in PCP office during this visit.  They were referred to the pharmacist by their PCP for assistance in managing diabetes.   Care Team: Primary Care Provider: [name], Lucio Sabin, MD ; Next Scheduled Visit: 09/22/23  Medication Access/Adherence  Current Pharmacy:  [facility] [place], [place] - [address] [place] [address] Phone: [phone] Fax: [phone]  [facility] - [place], [place] - [address] [place] [address] Phone: [phone] Fax: [phone]   Patient reports affordability concerns with their medications: No  Patient reports access/transportation concerns to their pharmacy: No  Patient reports adherence concerns with their medications:  No     Subjective: Visit was completed with the patient and her niece, Tonya Carpenter who helps with her medications. Patient reports she has a pill box that she fills herself and she takes 5 pills.   Diabetes:  Current medications: Toujeo  10 units daily, Janumet  XR 50-500 mg BID (thinks she may only be taking 1 tablet daily) Medications tried in the past: glimepiride   Using glucometer; testing once daily AM Fasting BG: recalls 96 and 111, says BG is always 90-100s   Patient denies hypoglycemic s/sx including dizziness, shakiness, sweating. Patient denies hyperglycemic symptoms including polyuria, polydipsia, polyphagia, nocturia, neuropathy,  blurred vision.  Current meal patterns:  - 3 meals/day - Breakfast: eggs, [place] bacon - Lunch: ensure  - Supper: chicken, vegetables, grilled cheese sandwich - Drinks: plenty of water  throughout the day, occasional soda, coffee with cream  Current medication access support: [employer]  Objective:  Lab Results  Component Value Date   HGBA1C 12.0 (H) 06/20/2023    Lab Results  Component Value Date   CREATININE 1.29 (H) 06/20/2023   BUN 21 06/20/2023   NA 138 06/20/2023   K 4.1 06/20/2023   CL 97 06/20/2023   CO2 22 06/20/2023    Lab Results  Component Value Date   CHOL 131 06/20/2023   HDL 53 06/20/2023   LDLCALC 59 06/20/2023   TRIG 101 06/20/2023   CHOLHDL 2.5 06/20/2023    Medications Reviewed Today     Reviewed by Philmore Bream, RPH (Pharmacist) on 08/22/23 at 1735  Med List Status: <None>   Medication Order Taking? Sig Documenting Provider Last Dose Status Informant  acetaminophen  (TYLENOL ) 500 MG tablet [id number]  Take 1,000 mg by mouth every 6 (six) hours as needed for mild pain or headache.  [provider]  Active Self  Alcohol  Swabs (DROPSAFE ALCOHOL  PREP) 70 % PADS [id number]  TEST BS BID and as needed Dx E11.9 [name], Lucio Sabin, MD  Active Self  aspirin  EC (ASPIRIN  LOW DOSE) 81 MG tablet [id number] Yes TAKE 1 TABLET EVERY DAY - SWALLOW WHOLE [name], Lucio Sabin, MD Taking Active Self  atorvastatin  (LIPITOR) 20 MG tablet [id number] Yes Take 1 tablet (20 mg total) by mouth daily. [name], Lucio Sabin, MD Taking Active Self  Blood Glucose  Calibration (TRUE METRIX LEVEL 1) Low SOLN [id number]  Use as needed with BS machine [name], Lucio Sabin, MD  Active Self  Blood Glucose Monitoring Suppl (TRUE METRIX AIR GLUCOSE METER) w/Device KIT [id number]  TEST BS BID and as needed Dx E11.9 [name], Lucio Sabin, MD  Active   Carboxymethylcellul-Glycerin [facility] RELIEVA OP) [id number]  Apply 1 drop to eye in the morning and at bedtime. [provider]   Active Self  cholecalciferol  (VITAMIN D3) 25 MCG (1000 UNIT) tablet [id number]  Take 1 tablet (1,000 Units total) by mouth daily. [name], Lucio Sabin, MD  Active Self  diclofenac  Sodium (VOLTAREN ) 1 % GEL [id number]  Apply 2 g topically 4 (four) times daily as needed. [name], Lucio Sabin, MD  Active   glucose blood (TRUE METRIX BLOOD GLUCOSE TEST) test strip [id number]  TEST BS BID and as needed Dx E11.9 [name], Lucio Sabin, MD  Active Self  insulin  glargine, 1 Unit Dial, (TOUJEO  SOLOSTAR) 300 UNIT/ML Solostar Pen [id number] Yes Inject 10 Units into the skin daily. [name], Lucio Sabin, MD Taking Active   JANUMET  XR 50-500 MG TB24 [id number] Yes TAKE 1 TABLET TWICE DAILY [name], Lucio Sabin, MD Taking Active   Lancet Devices (TRUEDRAW LANCING DEVICE) MISC [id number]  Teset BS BID Dx E11.9 [name], Lucio Sabin, MD  Active Self  losartan -hydrochlorothiazide  (HYZAAR) 50-12.5 MG tablet [id number]  TAKE 1 TABLET EVERY DAY [name], Lucio Sabin, MD  Active   metoprolol  succinate (TOPROL -XL) 50 MG 24 hr tablet [id number]  Take 1 tablet (50 mg total) by mouth daily. Take with or immediately following a meal. [name], Lucio Sabin, MD  Active   nitroGLYCERIN  (NITROSTAT ) 0.4 MG SL tablet [id number]  DISSOLVE 1 TABLET UNDER THE TONGUE EVERY 5 MINUTES AS NEEDED FOR CHEST PAIN [name], Lucio Sabin, MD  Active Self  omeprazole  (PRILOSEC) 40 MG capsule [id number]  Take 1 capsule (40 mg total) by mouth daily. [name], Lucio Sabin, MD  Active Self  Prodigy Twist Top Lancets 28G MISC [id number]  TEST BLOOD SUGAR TWICE DAILY, AND AS NEEDED Dx E11.9 [name], Lucio Sabin, MD  Active               Assessment/Plan:   Diabetes: - Currently uncontrolled based on last A1C 12.0% on 06/20/23, above goal <7%. Patient reported BG readings are much improved and now within goal since starting on basal insulin . Will continue insulin  at current dose and encouraged her to confirm she is taking Janumet  XR twice daily. Due for A1C at next PCP visit.  Could consider transition to once weekly GLP-1 to avoid need for daily insulin  injections and CKD benefit (Ozempic). She did not have access to her medication bottles during our visit, but her niece wrote down the medication names in order to confirm that the patient had each of these later. Patient's niece also expressed interest in setting her up for pill packaging. Encouraged her to reach out to [facility] where she is currently filling her medications as they do offer this. - Reviewed long term cardiovascular and renal outcomes of uncontrolled blood sugar - Reviewed goal A1c, goal fasting, and goal 2 hour post prandial glucose - Reviewed dietary modifications including: encouraged her to continue incorporate protein and non starchy veggies with meals - Recommend to continue Toujeo  10 units daily - Recommend to continue Janumet  XR 50-500 mg BID  - Patient denies personal or family history of multiple endocrine neoplasia type 2, medullary thyroid  cancer; personal history of pancreatitis or gallbladder disease. - Recommend to check  fasting blood glucose once daily   Follow Up Plan: PCP on 09/22/23  Georga Killings, PharmD PGY-1 Pharmacy Resident

## 2023-09-02 DIAGNOSIS — B351 Tinea unguium: Secondary | ICD-10-CM | POA: Diagnosis not present

## 2023-09-02 DIAGNOSIS — E1142 Type 2 diabetes mellitus with diabetic polyneuropathy: Secondary | ICD-10-CM | POA: Diagnosis not present

## 2023-09-02 DIAGNOSIS — M79674 Pain in right toe(s): Secondary | ICD-10-CM | POA: Diagnosis not present

## 2023-09-02 DIAGNOSIS — M79675 Pain in left toe(s): Secondary | ICD-10-CM | POA: Diagnosis not present

## 2023-09-10 ENCOUNTER — Telehealth: Payer: Self-pay | Admitting: Pharmacy Technician

## 2023-09-10 NOTE — Progress Notes (Signed)
   09/10/2023 Name: Tonya Carpenter MRN: [id number] DOB: 10-Apr-1945  Patient is appearing on a report for True [place] Metric Diabetes and last engaged with the clinical pharmacist to discuss diabetes on 08/22/2023. Contacted patient today to discuss diabetes management and completed medication review.   Diabetes Plan from last clinical pharmacist appointment:  Diabetes: - Currently uncontrolled based on last A1C 12.0% on 06/20/23, above goal <7%. Patient reported BG readings are much improved and now within goal since starting on basal insulin . Will continue insulin  at current dose and encouraged her to confirm she is taking Janumet  XR twice daily. Due for A1C at next PCP visit. Could consider transition to once weekly GLP-1 to avoid need for daily insulin  injections and CKD benefit (Ozempic). She did not have access to her medication bottles during our visit, but her niece wrote down the medication names in order to confirm that the patient had each of these later. Patient's niece also expressed interest in setting her up for pill packaging. Encouraged her to reach out to [facility] where she is currently filling her medications as they do offer this. - Reviewed long term cardiovascular and renal outcomes of uncontrolled blood sugar - Reviewed goal A1c, goal fasting, and goal 2 hour post prandial glucose - Reviewed dietary modifications including: encouraged her to continue incorporate protein and non starchy veggies with meals - Recommend to continue Toujeo  10 units daily - Recommend to continue Janumet  XR 50-500 mg BID  - Patient denies personal or family history of multiple endocrine neoplasia type 2, medullary thyroid  cancer; personal history of pancreatitis or gallbladder disease. - Recommend to check fasting blood glucose once daily    Medication Adherence Barriers Identified:  Patient made recommended medication changes per plan: Yes Spoke to patient's niece April and she informs patient is  taking Janumet  XR 50/500 bid and Toujeo  10 units daily. She informs patient has these medications on hand. Per Dr. Annemarie, patient's Toujeo  was last sold on 06/23/23 for 90 day supply and Janumet  XR on 07/29/23 for 90 days supply. Access issues with any new medication or testing device: No  Patient is checking blood sugars as prescribed: Yes Patient Is checking blood sugars in the morning before she at. April informs they have been ranging between 96-127. She informs patient has not reported any lows.Patient uses fingerstick checks for blood sugar and last had trest stips filled on 07/31/23 for 90 days supply per Dr Annemarie. April informs she has not been able to get pill packs started yet at [facility]. She informs when she went by the first time, the person that does the packs was not there. She informs she has an appointment today at 430pm to discuss pill packs at the pharmacy.  Medication Adherence Barriers Addressed/Actions Taken:  Reviewed medication changes per plan from last clinical pharmacist note ructions for monitoring blood sugars at home and reminded patient to keep a written log to review with pharmacist Reminded patient of date/time of upcoming clinical pharmacist follow up and any upcoming PCP/specialists visits. Patient denies transportation barriers to the appointment. Yes  Next clinical pharmacist appointment is scheduled for: 09/22/2023   Isaiha Asare, CPhT [facility] Office: [phone] Email: [email or web address]
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
## 2023-09-17 ENCOUNTER — Ambulatory Visit (INDEPENDENT_AMBULATORY_CARE_PROVIDER_SITE_OTHER): Admitting: Cardiology

## 2023-09-17 ENCOUNTER — Encounter: Payer: Self-pay | Admitting: Cardiology

## 2023-09-17 VITALS — BP 134/70 | HR 62 | Ht 66.0 in | Wt 149.0 lb

## 2023-09-17 DIAGNOSIS — E118 Type 2 diabetes mellitus with unspecified complications: Secondary | ICD-10-CM | POA: Diagnosis not present

## 2023-09-17 DIAGNOSIS — E785 Hyperlipidemia, unspecified: Secondary | ICD-10-CM | POA: Diagnosis not present

## 2023-09-17 DIAGNOSIS — R072 Precordial pain: Secondary | ICD-10-CM | POA: Diagnosis not present

## 2023-09-17 DIAGNOSIS — I10 Essential (primary) hypertension: Secondary | ICD-10-CM

## 2023-09-17 NOTE — Patient Instructions (Signed)
Medication Instructions:  Continue all current medications.  Labwork: none  Testing/Procedures: none  Follow-Up: As needed.    Any Other Special Instructions Will Be Listed Below (If Applicable).  If you need a refill on your cardiac medications before your next appointment, please call your pharmacy.  

## 2023-09-22 ENCOUNTER — Ambulatory Visit: Admitting: Family Medicine

## 2023-09-23 ENCOUNTER — Ambulatory Visit: Admitting: Nurse Practitioner

## 2023-09-24 ENCOUNTER — Ambulatory Visit (INDEPENDENT_AMBULATORY_CARE_PROVIDER_SITE_OTHER): Admitting: Family Medicine

## 2023-09-24 ENCOUNTER — Encounter: Payer: Self-pay | Admitting: Family Medicine

## 2023-09-24 VITALS — BP 149/72 | HR 66 | Ht 66.0 in | Wt 149.0 lb

## 2023-09-24 DIAGNOSIS — E1169 Type 2 diabetes mellitus with other specified complication: Secondary | ICD-10-CM | POA: Diagnosis not present

## 2023-09-24 DIAGNOSIS — E785 Hyperlipidemia, unspecified: Secondary | ICD-10-CM | POA: Diagnosis not present

## 2023-09-24 DIAGNOSIS — E1159 Type 2 diabetes mellitus with other circulatory complications: Secondary | ICD-10-CM | POA: Diagnosis not present

## 2023-09-24 DIAGNOSIS — K2100 Gastro-esophageal reflux disease with esophagitis, without bleeding: Secondary | ICD-10-CM | POA: Diagnosis not present

## 2023-09-24 DIAGNOSIS — I152 Hypertension secondary to endocrine disorders: Secondary | ICD-10-CM | POA: Diagnosis not present

## 2023-09-24 DIAGNOSIS — E1122 Type 2 diabetes mellitus with diabetic chronic kidney disease: Secondary | ICD-10-CM

## 2023-09-24 DIAGNOSIS — N183 Chronic kidney disease, stage 3 unspecified: Secondary | ICD-10-CM

## 2023-09-24 DIAGNOSIS — Z7984 Long term (current) use of oral hypoglycemic drugs: Secondary | ICD-10-CM | POA: Diagnosis not present

## 2023-09-24 DIAGNOSIS — Z1231 Encounter for screening mammogram for malignant neoplasm of breast: Secondary | ICD-10-CM

## 2023-09-24 LAB — BAYER DCA HB A1C WAIVED: HB A1C (BAYER DCA - WAIVED): 8.1 % — ABNORMAL HIGH (ref 4.8–5.6)

## 2023-09-24 NOTE — Progress Notes (Signed)
 Established Patient Office Visit  Subjective   Patient ID: Tonya Carpenter, female    DOB: 12-Dec-1945  Age: 78 y.o. MRN: [id number]    HPI  (1) Diabetes Mellitus The patient is present for a routine follow-up for type 2 diabetes mellitus. Patient report overall moderate blood glucose control with home glucose readings typically ranging from 130-140 and average fasting glucose of 100s. Patient deny any recent symptoms of polyuria, polydipsia, polyphagia, fatigue, or unexplained weight loss. No episodes of hypoglycemia or hyperglycemia requiring emergency care have occurred recently. Last hemoglobin A1C on 09/24/23 was 8.1. The patient reports good adherence to prescribed medications, which include Toujeo  and Janumet . Patient is aware about monitoring their carbohydrate intake and report partial adherence to following a diabetic-friendly die. Physical activity includes walking around the neighborhood. No new diabetic complications such as foot ulcers, neuropathy, or vision changes reported today. No new questions or concerns.   (2) Hypertension  The patient is present for a routine follow-up visit for management of hypertension. Patient report overall good blood pressure control since the last visit. She does not regularly monitor her blood pressure at home because she does not know how to use the machine. The patient denies any recent episodes of headache, dizziness, vision changes, chest pain, shortness of breath, or palpitations. Patient report good adherence to their antihypertensive medications, which currently include Metoprolol  and Hyzaar. Patient is aware of the importance of low-sodium dietary habits and report occasionally following the dietary recommendations. Exercise habits include walking. No recent hospitalizations, ER visits, or medication changes. No new questions or concerns.   (3) Hyperlipidemia  The patient is present for a routine follow-up visit for management of hyperlipidemia.  Patient report good adherence to their current lipid-lowering regime, which currently includes Lipitor. Patient deny any side effects such as muscle aches, weakness, abdominal pain, discomfort, or changes in appetite. Last lipid panel on 06/20/23  showed total cholesterol 131, triglycerides 101, LDL 59, and HDL 53. The patient reports partial adherence to dietary recommendations including reduction in saturated fats and cholesterol intake. Patient is physically moderately active, she walks around her neighborhood. No new cardiovascular symptoms such as chest pain, exertional dyspnea, or claudication. No new questions or concerns.     Review of Systems  Constitutional:  Negative for chills, fever, malaise/fatigue and weight loss.  HENT:  Negative for congestion, hearing loss and sore throat.   Eyes:  Negative for blurred vision.  Respiratory:  Negative for cough and shortness of breath.   Cardiovascular:  Negative for chest pain and leg swelling.  Musculoskeletal:  Negative for joint pain and myalgias.  Skin:  Negative for itching and rash.  Neurological:  Negative for dizziness, tingling, sensory change and headaches.  Psychiatric/Behavioral:  Negative for depression.       Objective:     BP (!) 149/72   Pulse 66   Ht 5' 6 (1.676 m)   Wt 149 lb (67.6 kg)   SpO2 97%   BMI 24.05 kg/m    Physical Exam Constitutional:      General: She is not in acute distress.    Appearance: Normal appearance.  Cardiovascular:     Rate and Rhythm: Normal rate and regular rhythm.     Pulses: Normal pulses.     Heart sounds: Normal heart sounds. No murmur heard. Pulmonary:     Effort: Pulmonary effort is normal. No respiratory distress.     Breath sounds: Normal breath sounds.  Neurological:     Mental  Status: She is alert and oriented to person, place, and time.  Psychiatric:        Mood and Affect: Mood normal.        Behavior: Behavior normal.      Results for orders placed or  performed in visit on 09/24/23  Bayer DCA Hb A1c Waived  Result Value Ref Range   HB A1C (BAYER DCA - WAIVED) 8.1 (H) 4.8 - 5.6 %      The ASCVD Risk score (Arnett DK, et al., 2019) failed to calculate for the following reasons:   Risk score cannot be calculated because patient has a medical history suggesting prior/existing ASCVD    Assessment & Plan:   Problem List Items Addressed This Visit       Cardiovascular and Mediastinum   Hypertension associated with diabetes (HCC)   Relevant Medications   atorvastatin  (LIPITOR) 20 MG tablet   Other Relevant Orders   Bayer DCA Hb A1c Waived (Completed)   CBC with Differential/Platelet (Completed)   CMP14+EGFR (Completed)   Lipid panel (Completed)     Digestive   GERD (gastroesophageal reflux disease)   Relevant Medications   omeprazole  (PRILOSEC) 40 MG capsule     Endocrine   Type 2 diabetes mellitus (HCC) - Primary   Relevant Medications   atorvastatin  (LIPITOR) 20 MG tablet   Other Relevant Orders   Bayer DCA Hb A1c Waived (Completed)   CBC with Differential/Platelet (Completed)   CMP14+EGFR (Completed)   Lipid panel (Completed)   Hyperlipidemia associated with type 2 diabetes mellitus (HCC)   Relevant Medications   atorvastatin  (LIPITOR) 20 MG tablet   Other Relevant Orders   Bayer DCA Hb A1c Waived (Completed)   CBC with Differential/Platelet (Completed)   CMP14+EGFR (Completed)   Lipid panel (Completed)   CKD stage 3 due to type 2 diabetes mellitus (HCC)   Relevant Medications   atorvastatin  (LIPITOR) 20 MG tablet   Other Visit Diagnoses       Encounter for screening mammogram for malignant neoplasm of breast       Relevant Orders   MM 3D SCREENING MAMMOGRAM BILATERAL BREAST       (1) Diabetes  - Assessment: Better control with A1C reduced to 8.1 compared to 12.0 three months ago on medications. - Plan: Continue taking Toujeo  and Janumet  as prescribed. Advised to monitor diet and reduce the amount of sugar  and carbohydrate heavy food with protein rich food. Also encouraged her to increase her physical activity by steadily increasing her walking routine.  (2) Hypertension  - Assessment: Well controlled on medication with no new red-flag symptoms.  - Plan: Continue taking Hyzaar and Metoprolol  as prescribed. Advised to monitor diet and increase physical activity. Advised her to bring the blood pressure machine to the clinic during her next visit, so we can help her learn how to use it to monitor blood pressure at home.   (3) Hyperlipidemia  - Assessment: Well controlled on medication, with exceptional lipid profile 3 months ago. - Plan: Continue taking Lipitor as prescribed. Advised to monitor diet and increase physical activity.   Return in about 3 months (around 12/25/2023), or if symptoms worsen or fail to improve, for Diabetes recheck.   Dotty Blanch, Medical Student  [employer]  at [facility] 09/24/23 4:38 PM   I was personally present for all components of the history, physical exam and/or medical decision making.  I agree with the documentation performed by the student and agree with assessment and  plan above.  Fonda Levins, MD [facility] 10/01/2023, 3:00 PM

## 2023-09-25 LAB — CMP14+EGFR
ALT: 26 IU/L (ref 0–32)
AST: 20 IU/L (ref 0–40)
Albumin: 4.1 g/dL (ref 3.8–4.8)
Alkaline Phosphatase: 70 IU/L (ref 44–121)
BUN/Creatinine Ratio: 18 (ref 12–28)
BUN: 25 mg/dL (ref 8–27)
Bilirubin Total: 0.2 mg/dL (ref 0.0–1.2)
CO2: 24 mmol/L (ref 20–29)
Calcium: 9.6 mg/dL (ref 8.7–10.3)
Chloride: 105 mmol/L (ref 96–106)
Creatinine, Ser: 1.4 mg/dL — ABNORMAL HIGH (ref 0.57–1.00)
Globulin, Total: 3.8 g/dL (ref 1.5–4.5)
Glucose: 109 mg/dL — ABNORMAL HIGH (ref 70–99)
Potassium: 4.3 mmol/L (ref 3.5–5.2)
Sodium: 141 mmol/L (ref 134–144)
Total Protein: 7.9 g/dL (ref 6.0–8.5)
eGFR: 39 mL/min/1.73 — ABNORMAL LOW (ref >59)

## 2023-09-25 LAB — CBC WITH DIFFERENTIAL/PLATELET
Basophils Absolute: 0 x10E3/uL (ref 0.0–0.2)
Basos: 0 %
EOS (ABSOLUTE): 0.1 x10E3/uL (ref 0.0–0.4)
Eos: 1 %
Hematocrit: 38.6 % (ref 34.0–46.6)
Hemoglobin: 11.6 g/dL (ref 11.1–15.9)
Immature Grans (Abs): 0 x10E3/uL (ref 0.0–0.1)
Immature Granulocytes: 0 %
Lymphocytes Absolute: 3.2 x10E3/uL — ABNORMAL HIGH (ref 0.7–3.1)
Lymphs: 31 %
MCH: 26.9 pg (ref 26.6–33.0)
MCHC: 30.1 g/dL — ABNORMAL LOW (ref 31.5–35.7)
MCV: 90 fL (ref 79–97)
Monocytes Absolute: 0.9 x10E3/uL (ref 0.1–0.9)
Monocytes: 8 %
Neutrophils Absolute: 6.1 x10E3/uL (ref 1.4–7.0)
Neutrophils: 60 %
Platelets: 262 x10E3/uL (ref 150–450)
RBC: 4.31 x10E6/uL (ref 3.77–5.28)
RDW: 13.4 % (ref 11.7–15.4)
WBC: 10.3 x10E3/uL (ref 3.4–10.8)

## 2023-09-25 LAB — LIPID PANEL
Chol/HDL Ratio: 2.8 ratio (ref 0.0–4.4)
Cholesterol, Total: 155 mg/dL (ref 100–199)
HDL: 55 mg/dL (ref >39)
LDL Chol Calc (NIH): 83 mg/dL (ref 0–99)
Triglycerides: 92 mg/dL (ref 0–149)
VLDL Cholesterol Cal: 17 mg/dL (ref 5–40)

## 2023-09-29 ENCOUNTER — Ambulatory Visit: Payer: Self-pay | Admitting: Family Medicine

## 2023-10-01 MED ORDER — VITAMIN D3 25 MCG (1000 UNIT) PO TABS: 1000.0000 [IU] | ORAL_TABLET | Freq: Every day | ORAL | 2 refills | Status: AC

## 2023-10-01 MED ORDER — OMEPRAZOLE 40 MG PO CPDR: 40.0000 mg | DELAYED_RELEASE_CAPSULE | Freq: Every day | ORAL | 3 refills | Status: AC

## 2023-10-01 MED ORDER — ATORVASTATIN CALCIUM 20 MG PO TABS: 20.0000 mg | ORAL_TABLET | Freq: Every day | ORAL | 3 refills | Status: AC

## 2023-10-22 ENCOUNTER — Ambulatory Visit: Payer: Self-pay

## 2023-10-22 NOTE — Telephone Encounter (Signed)
 FYI Only or Action Required?: FYI only for provider.  Patient was last seen in primary care on 09/24/2023 by [name], Fonda LABOR, MD.  Called Nurse Triage reporting Hand Pain.  Symptoms began every so often.  Interventions attempted: Nothing.  Symptoms are: stable.  Triage Disposition: Home Care  Patient/caregiver understands and will follow disposition?: Yes  Copied from CRM #[id number]. Topic: Clinical - Red Word Triage >> Oct 22, 2023 11:47 AM Harlene ORN wrote: Red Word that prompted transfer to Nurse Triage: severe pain and cramps in both hand Reason for Disposition  Hand pain  Answer Assessment - Initial Assessment Questions 1. ONSET: When did the pain start?     Cramps have been going on and off.  2. LOCATION: Where is the pain located?     Bilateral hands 3. PAIN: How bad is the pain? (Scale 1-10; or mild, moderate, severe)     mild 4. WORK OR EXERCISE: Has there been any recent work or exercise that involved this part (i.e., hand or wrist) of the body?     no 5. CAUSE: What do you think is causing the pain?     no 6. AGGRAVATING FACTORS: What makes the pain worse? (e.g., using computer)     no 7. OTHER SYMPTOMS: Do you have any other symptoms? (e.g., fever, neck pain, numbness or tingling, rash, swelling)     no  Protocols used: Hand Pain-A-AH

## 2023-10-22 NOTE — Telephone Encounter (Signed)
Appt reminder mailed to pt.

## 2023-11-04 ENCOUNTER — Ambulatory Visit

## 2023-12-10 ENCOUNTER — Ambulatory Visit
Admission: RE | Admit: 2023-12-10 | Discharge: 2023-12-10 | Disposition: A | Discharge status: Home / Self Care | Source: Ambulatory Visit | Attending: Family Medicine | Admitting: Family Medicine

## 2023-12-10 DIAGNOSIS — Z1231 Encounter for screening mammogram for malignant neoplasm of breast: Secondary | ICD-10-CM | POA: Diagnosis not present

## 2023-12-16 DIAGNOSIS — B351 Tinea unguium: Secondary | ICD-10-CM | POA: Diagnosis not present

## 2023-12-16 DIAGNOSIS — L84 Corns and callosities: Secondary | ICD-10-CM | POA: Diagnosis not present

## 2023-12-16 DIAGNOSIS — E1142 Type 2 diabetes mellitus with diabetic polyneuropathy: Secondary | ICD-10-CM | POA: Diagnosis not present

## 2023-12-16 DIAGNOSIS — M79674 Pain in right toe(s): Secondary | ICD-10-CM | POA: Diagnosis not present

## 2023-12-16 DIAGNOSIS — M79675 Pain in left toe(s): Secondary | ICD-10-CM | POA: Diagnosis not present

## 2023-12-25 ENCOUNTER — Encounter: Payer: Self-pay | Admitting: Family Medicine

## 2023-12-25 ENCOUNTER — Ambulatory Visit: Admitting: Family Medicine

## 2023-12-25 VITALS — BP 147/68 | HR 73 | Temp 97.7°F | Ht 66.0 in | Wt 153.0 lb

## 2023-12-25 DIAGNOSIS — F03B0 Unspecified dementia, moderate, without behavioral disturbance, psychotic disturbance, mood disturbance, and anxiety: Secondary | ICD-10-CM

## 2023-12-25 DIAGNOSIS — E1159 Type 2 diabetes mellitus with other circulatory complications: Secondary | ICD-10-CM | POA: Diagnosis not present

## 2023-12-25 DIAGNOSIS — I152 Hypertension secondary to endocrine disorders: Secondary | ICD-10-CM | POA: Diagnosis not present

## 2023-12-25 DIAGNOSIS — E1122 Type 2 diabetes mellitus with diabetic chronic kidney disease: Secondary | ICD-10-CM | POA: Diagnosis not present

## 2023-12-25 DIAGNOSIS — E1169 Type 2 diabetes mellitus with other specified complication: Secondary | ICD-10-CM

## 2023-12-25 DIAGNOSIS — E7800 Pure hypercholesterolemia, unspecified: Secondary | ICD-10-CM

## 2023-12-25 DIAGNOSIS — N1832 Chronic kidney disease, stage 3b: Secondary | ICD-10-CM

## 2023-12-25 DIAGNOSIS — Z794 Long term (current) use of insulin: Secondary | ICD-10-CM

## 2023-12-25 DIAGNOSIS — N183 Chronic kidney disease, stage 3 unspecified: Secondary | ICD-10-CM | POA: Diagnosis not present

## 2023-12-25 DIAGNOSIS — Z23 Encounter for immunization: Secondary | ICD-10-CM | POA: Diagnosis not present

## 2023-12-25 DIAGNOSIS — E785 Hyperlipidemia, unspecified: Secondary | ICD-10-CM

## 2023-12-25 LAB — LIPID PANEL

## 2023-12-25 LAB — BAYER DCA HB A1C WAIVED: HB A1C (BAYER DCA - WAIVED): 8.3 % — ABNORMAL HIGH (ref 4.8–5.6)

## 2023-12-25 MED ORDER — TOUJEO SOLOSTAR 300 UNIT/ML ~~LOC~~ SOPN: 12.0000 [IU] | PEN_INJECTOR | Freq: Every day | SUBCUTANEOUS | 3 refills | Status: AC

## 2023-12-25 MED ORDER — TRUE METRIX BLOOD GLUCOSE TEST VI STRP: ORAL_STRIP | 3 refills | Status: AC

## 2023-12-25 MED ORDER — ASPIRIN 81 MG PO TBEC: DELAYED_RELEASE_TABLET | ORAL | 3 refills | Status: AC

## 2023-12-25 MED ORDER — DROPSAFE ALCOHOL PREP 70 % PADS: MEDICATED_PAD | 3 refills | Status: AC

## 2023-12-25 MED ORDER — JANUMET XR 50-500 MG PO TB24: 1.0000 | ORAL_TABLET | Freq: Two times a day (BID) | ORAL | 3 refills | Status: AC

## 2023-12-25 NOTE — Progress Notes (Signed)
 BP (!) 147/68   Pulse 73   Temp 97.7 F (36.5 C)   Ht 5' 6 (1.676 m)   Wt 153 lb (69.4 kg)   SpO2 100%   BMI 24.69 kg/m    Subjective:   Patient ID: Tonya Carpenter, female    DOB: 1945/04/01, 78 y.o.   MRN: [id number]  HPI: Tonya Carpenter is a 78 y.o. female presenting on 12/25/2023 for Medical Management of Chronic Issues and Diabetes   Discussed the use of AI scribe software for clinical note transcription with the patient, who gave verbal consent to proceed.  History of Present Illness   Tonya Carpenter is a 78 year old female with diabetes who presents for a recheck of her blood sugar levels.  Hyperglycemia and diabetes management - Morning blood glucose was 111 mg/dL today - Previous glucose meter reading showed 230 mg/dL, believed to be from an old meter - Hemoglobin A1c is 8.3% - Currently taking Janumet  twice daily - Administers insulin  once daily, usually in the morning; forgot to take insulin  today - Uses a second box of needles, indicating regular insulin  use - Keeps insulin  refrigerated  Dietary habits - Diet includes significant amount of bread - Reduced intake of canned milk - Increasing fruit consumption - Minimal consumption of rice or potatoes - Frequently eats grilled cheese sandwiches - Uses brown bread instead of white for gut health - Occasionally uses Splenda in coffee but prefers sugar  Anxiety and cognitive changes - Increased anxiety, possibly related to moving from her apartment - Memory is stable with slight changes noted - Has lived in current location for a long time  Neck pain - Neck pain attributed to sleeping position - Uses heating pad and occasionally a massage device for relief - Performs regular neck stretching to manage discomfort          Relevant past medical, surgical, family and social history reviewed and updated as indicated. Interim medical history since our last visit reviewed. Allergies and medications reviewed and  updated.  Review of Systems  Constitutional:  Negative for chills and fever.  HENT:  Negative for congestion, ear discharge and ear pain.   Eyes:  Negative for redness and visual disturbance.  Respiratory:  Negative for chest tightness and shortness of breath.   Cardiovascular:  Negative for chest pain and leg swelling.  Genitourinary:  Negative for difficulty urinating and dysuria.  Musculoskeletal:  Positive for arthralgias and myalgias. Negative for back pain and gait problem.  Skin:  Negative for rash.  Neurological:  Negative for dizziness, light-headedness and headaches.  Psychiatric/Behavioral:  Negative for agitation and behavioral problems.   All other systems reviewed and are negative.   Per HPI unless specifically indicated above   Allergies as of 12/25/2023       Reactions   Trilyte  [peg [id number] -[id number] Bicarb-nacl] Nausea And Vomiting        Medication List        Accurate as of December 25, 2023 11:15 AM. If you have any questions, ask your nurse or doctor.          acetaminophen  500 MG tablet Commonly known as: TYLENOL  Take 1,000 mg by mouth every 6 (six) hours as needed for mild pain or headache.   aspirin  EC 81 MG tablet Commonly known as: Aspirin  Low Dose TAKE 1 TABLET EVERY DAY - SWALLOW WHOLE   atorvastatin  20 MG tablet Commonly known as: LIPITOR Take 1 tablet (20 mg total) by mouth daily.  cholecalciferol  25 MCG (1000 UNIT) tablet Commonly known as: VITAMIN D3 Take 1 tablet (1,000 Units total) by mouth daily.   diclofenac  Sodium 1 % Gel Commonly known as: VOLTAREN  Apply 2 g topically 4 (four) times daily as needed.   DropSafe Alcohol  Prep 70 % Pads TEST BS BID and as needed Dx E11.9   Janumet  XR 50-500 MG Tb24 Generic drug: SitaGLIPtin -MetFORMIN  HCl Take 1 tablet by mouth 2 (two) times daily.   losartan -hydrochlorothiazide  50-12.5 MG tablet Commonly known as: HYZAAR TAKE 1 TABLET EVERY DAY   metoprolol  succinate 50 MG 24 hr  tablet Commonly known as: TOPROL -XL Take 1 tablet (50 mg total) by mouth daily. Take with or immediately following a meal.   nitroGLYCERIN  0.4 MG SL tablet Commonly known as: NITROSTAT  DISSOLVE 1 TABLET UNDER THE TONGUE EVERY 5 MINUTES AS NEEDED FOR CHEST PAIN   omeprazole  40 MG capsule Commonly known as: PRILOSEC Take 1 capsule (40 mg total) by mouth daily.   Prodigy Twist Top Lancets 28G Misc TEST BLOOD SUGAR TWICE DAILY, AND AS NEEDED Dx E11.9   REFRESH RELIEVA OP Apply 1 drop to eye in the morning and at bedtime.   Toujeo  SoloStar 300 UNIT/ML Solostar Pen Generic drug: insulin  glargine (1 Unit Dial) Inject 12 Units into the skin daily. What changed: how much to take Changed by: Fonda LABOR Neesha Langton   True Metrix Air Glucose Meter w/Device Kit TEST BS BID and as needed Dx E11.9   True Metrix Blood Glucose Test test strip Generic drug: glucose blood TEST BS BID and as needed Dx E11.9   True Metrix Level 1 Low Soln Use as needed with BS machine   TRUEdraw Lancing Device Misc Teset BS BID Dx E11.9         Objective:   BP (!) 147/68   Pulse 73   Temp 97.7 F (36.5 C)   Ht 5' 6 (1.676 m)   Wt 153 lb (69.4 kg)   SpO2 100%   BMI 24.69 kg/m   Wt Readings from Last 3 Encounters:  12/25/23 153 lb (69.4 kg)  09/24/23 149 lb (67.6 kg)  09/17/23 149 lb (67.6 kg)    Physical Exam Vitals and nursing note reviewed.  Constitutional:      Appearance: Normal appearance.  Musculoskeletal:     Cervical back: Tenderness (Right paraspinal muscular tenderness) present. No torticollis or bony tenderness. No pain with movement. Normal range of motion.  Neurological:     Mental Status: She is alert.    Physical Exam   CHEST: Lungs clear to auscultation. CARDIOVASCULAR: Regular heart sounds.         Assessment & Plan:   Problem List Items Addressed This Visit       Cardiovascular and Mediastinum   Hypertension associated with diabetes (HCC) - Primary   Relevant  Medications   aspirin  EC (ASPIRIN  LOW DOSE) 81 MG tablet   JANUMET  XR 50-500 MG TB24   insulin  glargine, 1 Unit Dial, (TOUJEO  SOLOSTAR) 300 UNIT/ML Solostar Pen   Other Relevant Orders   Bayer DCA Hb A1c Waived   CBC with Differential/Platelet   CMP14+EGFR   Lipid panel     Endocrine   Type 2 diabetes mellitus (HCC)   Relevant Medications   aspirin  EC (ASPIRIN  LOW DOSE) 81 MG tablet   JANUMET  XR 50-500 MG TB24   insulin  glargine, 1 Unit Dial, (TOUJEO  SOLOSTAR) 300 UNIT/ML Solostar Pen   Hyperlipidemia associated with type 2 diabetes mellitus (HCC)   Relevant Medications  aspirin  EC (ASPIRIN  LOW DOSE) 81 MG tablet   JANUMET  XR 50-500 MG TB24   insulin  glargine, 1 Unit Dial, (TOUJEO  SOLOSTAR) 300 UNIT/ML Solostar Pen   CKD stage 3 due to type 2 diabetes mellitus (HCC)   Relevant Medications   aspirin  EC (ASPIRIN  LOW DOSE) 81 MG tablet   JANUMET  XR 50-500 MG TB24   insulin  glargine, 1 Unit Dial, (TOUJEO  SOLOSTAR) 300 UNIT/ML Solostar Pen   Other Relevant Orders   Bayer DCA Hb A1c Waived   CBC with Differential/Platelet   CMP14+EGFR   Lipid panel     Nervous and Auditory   Unspecified dementia, moderate, without behavioral disturbance, psychotic disturbance, mood disturbance, and anxiety (HCC)   Other Visit Diagnoses       Pure hypercholesterolemia       Relevant Medications   aspirin  EC (ASPIRIN  LOW DOSE) 81 MG tablet   Other Relevant Orders   Bayer DCA Hb A1c Waived   CBC with Differential/Platelet   CMP14+EGFR   Lipid panel           Type 2 diabetes mellitus with chronic kidney disease and circulatory complications Blood sugar levels are fluctuating with poor long-term control indicated by A1c of 8.3%. High bread consumption is contributing to elevated levels. - Increase insulin  dosage to 12 units daily. - Encouraged reduction in bread consumption and exploration of alternative foods such as eggs, peanut butter on celery, and fruits. - Switch sugar in coffee to  Splenda or Stevia. - Encouraged regular walking to help manage blood sugar levels.  Unspecified moderate dementia Memory is stable with slight changes. Anxiety related to potential relocation is contributing to stress.  Anxiety disorder Anxiety levels have increased due to stress related to potential relocation.  Neck muscle pain Pain likely due to muscle strain from sleeping position. - Use a heating pad on the neck. - Perform regular neck stretches every 15-20 minutes. - Consider using a massage gun for muscle relief.          Follow up plan: Return in about 3 months (around 03/26/2024), or if symptoms worsen or fail to improve, for Diabetes recheck.  Counseling provided for all of the vaccine components Orders Placed This Encounter  Procedures   Bayer DCA Hb A1c Waived   CBC with Differential/Platelet   CMP14+EGFR   Lipid panel    Fonda Levins, MD [facility] 12/25/2023, 11:15 AM

## 2023-12-26 LAB — CBC WITH DIFFERENTIAL/PLATELET
Basophils Absolute: 0 x10E3/uL (ref 0.0–0.2)
Basos: 0 %
EOS (ABSOLUTE): 0.1 x10E3/uL (ref 0.0–0.4)
Eos: 1 %
Hematocrit: 38.4 % (ref 34.0–46.6)
Hemoglobin: 11.9 g/dL (ref 11.1–15.9)
Immature Grans (Abs): 0 x10E3/uL (ref 0.0–0.1)
Immature Granulocytes: 0 %
Lymphocytes Absolute: 3.1 x10E3/uL (ref 0.7–3.1)
Lymphs: 25 %
MCH: 27.1 pg (ref 26.6–33.0)
MCHC: 31 g/dL — ABNORMAL LOW (ref 31.5–35.7)
MCV: 88 fL (ref 79–97)
Monocytes Absolute: 0.8 x10E3/uL (ref 0.1–0.9)
Monocytes: 7 %
Neutrophils Absolute: 8.4 x10E3/uL — ABNORMAL HIGH (ref 1.4–7.0)
Neutrophils: 67 %
Platelets: 263 x10E3/uL (ref 150–450)
RBC: 4.39 x10E6/uL (ref 3.77–5.28)
RDW: 13.8 % (ref 11.7–15.4)
WBC: 12.5 x10E3/uL — ABNORMAL HIGH (ref 3.4–10.8)

## 2023-12-26 LAB — CMP14+EGFR
ALT: 17 IU/L (ref 0–32)
AST: 17 IU/L (ref 0–40)
Albumin: 4.3 g/dL (ref 3.8–4.8)
Alkaline Phosphatase: 75 IU/L (ref 49–135)
BUN/Creatinine Ratio: 18 (ref 12–28)
BUN: 29 mg/dL — ABNORMAL HIGH (ref 8–27)
Bilirubin Total: 0.5 mg/dL (ref 0.0–1.2)
CO2: 23 mmol/L (ref 20–29)
Calcium: 10.2 mg/dL (ref 8.7–10.3)
Chloride: 100 mmol/L (ref 96–106)
Creatinine, Ser: 1.63 mg/dL — ABNORMAL HIGH (ref 0.57–1.00)
Globulin, Total: 4.3 g/dL (ref 1.5–4.5)
Glucose: 131 mg/dL — ABNORMAL HIGH (ref 70–99)
Potassium: 4.2 mmol/L (ref 3.5–5.2)
Sodium: 139 mmol/L (ref 134–144)
Total Protein: 8.6 g/dL — ABNORMAL HIGH (ref 6.0–8.5)
eGFR: 32 mL/min/1.73 — ABNORMAL LOW (ref >59)

## 2023-12-26 LAB — LIPID PANEL
Cholesterol, Total: 167 mg/dL (ref 100–199)
HDL: 60 mg/dL (ref >39)
LDL CALC COMMENT:: 2.8 ratio (ref 0.0–4.4)
LDL Chol Calc (NIH): 86 mg/dL (ref 0–99)
Triglycerides: 116 mg/dL (ref 0–149)
VLDL Cholesterol Cal: 21 mg/dL (ref 5–40)

## 2023-12-29 ENCOUNTER — Ambulatory Visit: Admitting: Family Medicine

## 2024-01-01 ENCOUNTER — Ambulatory Visit: Payer: Self-pay | Admitting: Family Medicine

## 2024-02-04 ENCOUNTER — Telehealth: Payer: Self-pay
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
## 2024-02-04 NOTE — Telephone Encounter (Signed)
 Copied from CRM [id number]. Topic: Clinical - Medical Advice >> Feb 04, 2024 11:15 AM Graeme ORN wrote: Reason for CRM: Patient called. States she has mold in her apartment. Would like to know if provider thinks she needs to be checked out. No symptoms. Thank You

## 2024-02-04 NOTE — Telephone Encounter (Signed)
 If she has black mold then she should definitely have somebody inspected.  I do not know if they have inspectors for that through the [facility] department or if you would have to hire somebody but it definitely would be a good idea to get it checked out because it can affect respiratory symptoms over time if you do have black mold and they would have to treat it.  If she has an apartment and just renting it then she may want to contact her landlord first because they may do the inspection and everything for her.

## 2024-02-04 NOTE — Telephone Encounter (Signed)
 Tried calling patient back to give her PCPs advise but no answer and no option to leave a message.

## 2024-03-05 ENCOUNTER — Telehealth: Payer: Self-pay | Admitting: Family Medicine

## 2024-03-05 NOTE — Telephone Encounter (Signed)
 Pts son made aware that it is alright for her to take Pepto.

## 2024-03-05 NOTE — Telephone Encounter (Signed)
 Copied from CRM [id number]. Topic: Clinical - Medication Question >> Mar 05, 2024 12:26 PM Delon DASEN wrote: Reason for CRM: son Darin asking if patient is able to take pepto bismol while being diabetic- [phone]

## 2024-04-01 ENCOUNTER — Ambulatory Visit

## 2024-04-01 ENCOUNTER — Encounter: Payer: Self-pay | Admitting: Family Medicine

## 2024-04-01 ENCOUNTER — Ambulatory Visit: Payer: Self-pay | Admitting: Family Medicine

## 2024-04-01 VITALS — BP 146/76 | HR 73 | Ht 66.0 in | Wt 151.0 lb

## 2024-04-01 DIAGNOSIS — I152 Hypertension secondary to endocrine disorders: Secondary | ICD-10-CM | POA: Diagnosis not present

## 2024-04-01 DIAGNOSIS — Z780 Asymptomatic menopausal state: Secondary | ICD-10-CM

## 2024-04-01 DIAGNOSIS — H6123 Impacted cerumen, bilateral: Secondary | ICD-10-CM | POA: Diagnosis not present

## 2024-04-01 DIAGNOSIS — S82122S Displaced fracture of lateral condyle of left tibia, sequela: Secondary | ICD-10-CM | POA: Diagnosis not present

## 2024-04-01 DIAGNOSIS — E785 Hyperlipidemia, unspecified: Secondary | ICD-10-CM

## 2024-04-01 DIAGNOSIS — N183 Chronic kidney disease, stage 3 unspecified: Secondary | ICD-10-CM

## 2024-04-01 DIAGNOSIS — Z794 Long term (current) use of insulin: Secondary | ICD-10-CM | POA: Diagnosis not present

## 2024-04-01 DIAGNOSIS — E1169 Type 2 diabetes mellitus with other specified complication: Secondary | ICD-10-CM

## 2024-04-01 DIAGNOSIS — E1159 Type 2 diabetes mellitus with other circulatory complications: Secondary | ICD-10-CM | POA: Diagnosis not present

## 2024-04-01 DIAGNOSIS — E1122 Type 2 diabetes mellitus with diabetic chronic kidney disease: Secondary | ICD-10-CM | POA: Diagnosis not present

## 2024-04-01 LAB — CBC WITH DIFFERENTIAL/PLATELET
Basophils Absolute: 0.1 x10E3/uL (ref 0.0–0.2)
Basos: 1 %
EOS (ABSOLUTE): 0.1 x10E3/uL (ref 0.0–0.4)
Eos: 1 %
Hematocrit: 38.4 % (ref 34.0–46.6)
Hemoglobin: 12.1 g/dL (ref 11.1–15.9)
Immature Grans (Abs): 0 x10E3/uL (ref 0.0–0.1)
Immature Granulocytes: 0 %
Lymphocytes Absolute: 3.1 x10E3/uL (ref 0.7–3.1)
Lymphs: 29 %
MCH: 27.9 pg (ref 26.6–33.0)
MCHC: 31.5 g/dL (ref 31.5–35.7)
MCV: 89 fL (ref 79–97)
Monocytes Absolute: 0.8 x10E3/uL (ref 0.1–0.9)
Monocytes: 8 %
Neutrophils Absolute: 6.6 x10E3/uL (ref 1.4–7.0)
Neutrophils: 61 %
Platelets: 265 x10E3/uL (ref 150–450)
RBC: 4.34 x10E6/uL (ref 3.77–5.28)
RDW: 12.5 % (ref 11.7–15.4)
WBC: 10.7 x10E3/uL (ref 3.4–10.8)

## 2024-04-01 LAB — LIPID PANEL
Chol/HDL Ratio: 2.5 ratio (ref 0.0–4.4)
Cholesterol, Total: 140 mg/dL (ref 100–199)
HDL: 56 mg/dL
LDL Chol Calc (NIH): 59 mg/dL (ref 0–99)
Triglycerides: 144 mg/dL (ref 0–149)
VLDL Cholesterol Cal: 25 mg/dL (ref 5–40)

## 2024-04-01 LAB — CMP14+EGFR
ALT: 13 IU/L (ref 0–32)
AST: 14 IU/L (ref 0–40)
Albumin: 4.3 g/dL (ref 3.8–4.8)
Alkaline Phosphatase: 72 IU/L (ref 49–135)
BUN/Creatinine Ratio: 12 (ref 12–28)
BUN: 16 mg/dL (ref 8–27)
Bilirubin Total: 0.4 mg/dL (ref 0.0–1.2)
CO2: 24 mmol/L (ref 20–29)
Calcium: 10 mg/dL (ref 8.7–10.3)
Chloride: 98 mmol/L (ref 96–106)
Creatinine, Ser: 1.39 mg/dL — ABNORMAL HIGH (ref 0.57–1.00)
Globulin, Total: 3.6 g/dL (ref 1.5–4.5)
Glucose: 267 mg/dL — ABNORMAL HIGH (ref 70–99)
Potassium: 4.3 mmol/L (ref 3.5–5.2)
Sodium: 136 mmol/L (ref 134–144)
Total Protein: 7.9 g/dL (ref 6.0–8.5)
eGFR: 39 mL/min/1.73 — ABNORMAL LOW

## 2024-04-01 LAB — BAYER DCA HB A1C WAIVED: HB A1C (BAYER DCA - WAIVED): 11.3 % — ABNORMAL HIGH (ref 4.8–5.6)

## 2024-04-01 LAB — TSH: TSH: 1.51 u[IU]/mL (ref 0.450–4.500)

## 2024-04-01 MED ORDER — METOPROLOL SUCCINATE ER 50 MG PO TB24: 50.0000 mg | ORAL_TABLET | Freq: Every day | ORAL | 3 refills | Status: AC

## 2024-04-01 MED ORDER — LOSARTAN POTASSIUM-HCTZ 50-12.5 MG PO TABS: 1.0000 | ORAL_TABLET | Freq: Every day | ORAL | 3 refills | Status: AC
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
## 2024-04-09 ENCOUNTER — Ambulatory Visit: Payer: Self-pay | Admitting: Family Medicine

## 2024-07-01 ENCOUNTER — Ambulatory Visit: Admitting: Family Medicine

## 2024-07-08 ENCOUNTER — Ambulatory Visit: Admitting: Neurology
